# Patient Record
Sex: Female | Born: 1975
Health system: Southern US, Community
[De-identification: ages and names within clinical notes are randomized; demographics above are authoritative.]

## PROBLEM LIST (undated history)

## (undated) DIAGNOSIS — F329 Major depressive disorder, single episode, unspecified: Secondary | ICD-10-CM

## (undated) DIAGNOSIS — F17201 Nicotine dependence, unspecified, in remission: Secondary | ICD-10-CM

## (undated) DIAGNOSIS — M549 Dorsalgia, unspecified: Secondary | ICD-10-CM

## (undated) DIAGNOSIS — Z8639 Personal history of other endocrine, nutritional and metabolic disease: Secondary | ICD-10-CM

## (undated) DIAGNOSIS — F32A Depression, unspecified: Secondary | ICD-10-CM

## (undated) DIAGNOSIS — M5136 Other intervertebral disc degeneration, lumbar region: Secondary | ICD-10-CM

## (undated) DIAGNOSIS — M170 Bilateral primary osteoarthritis of knee: Secondary | ICD-10-CM

## (undated) DIAGNOSIS — M1990 Unspecified osteoarthritis, unspecified site: Secondary | ICD-10-CM

## (undated) DIAGNOSIS — G5600 Carpal tunnel syndrome, unspecified upper limb: Secondary | ICD-10-CM

## (undated) DIAGNOSIS — G43909 Migraine, unspecified, not intractable, without status migrainosus: Secondary | ICD-10-CM

## (undated) DIAGNOSIS — E785 Hyperlipidemia, unspecified: Secondary | ICD-10-CM

## (undated) DIAGNOSIS — K219 Gastro-esophageal reflux disease without esophagitis: Secondary | ICD-10-CM

## (undated) DIAGNOSIS — M51369 Other intervertebral disc degeneration, lumbar region without mention of lumbar back pain or lower extremity pain: Secondary | ICD-10-CM

## (undated) DIAGNOSIS — J309 Allergic rhinitis, unspecified: Secondary | ICD-10-CM

## (undated) DIAGNOSIS — M479 Spondylosis, unspecified: Secondary | ICD-10-CM

## (undated) DIAGNOSIS — Z8709 Personal history of other diseases of the respiratory system: Secondary | ICD-10-CM

## (undated) DIAGNOSIS — I872 Venous insufficiency (chronic) (peripheral): Secondary | ICD-10-CM

## (undated) DIAGNOSIS — I10 Essential (primary) hypertension: Secondary | ICD-10-CM

## (undated) DIAGNOSIS — N879 Dysplasia of cervix uteri, unspecified: Secondary | ICD-10-CM

## (undated) DIAGNOSIS — J42 Unspecified chronic bronchitis: Secondary | ICD-10-CM

## (undated) DIAGNOSIS — G8929 Other chronic pain: Secondary | ICD-10-CM

## (undated) DIAGNOSIS — I82409 Acute embolism and thrombosis of unspecified deep veins of unspecified lower extremity: Secondary | ICD-10-CM

## (undated) DIAGNOSIS — Z9884 Bariatric surgery status: Secondary | ICD-10-CM

## (undated) DIAGNOSIS — N83209 Unspecified ovarian cyst, unspecified side: Secondary | ICD-10-CM

## (undated) DIAGNOSIS — Z8781 Personal history of (healed) traumatic fracture: Secondary | ICD-10-CM

## (undated) DIAGNOSIS — S82201A Unspecified fracture of shaft of right tibia, initial encounter for closed fracture: Secondary | ICD-10-CM

## (undated) HISTORY — DX: Hyperlipidemia, unspecified: E78.5

## (undated) HISTORY — DX: Bilateral primary osteoarthritis of knee: M17.0

## (undated) HISTORY — PX: COLONOSCOPY: SHX174

## (undated) HISTORY — DX: Allergic rhinitis, unspecified: J30.9

## (undated) HISTORY — PX: CERVICAL CONE BIOPSY: SUR198

## (undated) HISTORY — DX: Unspecified ovarian cyst, unspecified side: N83.209

## (undated) HISTORY — DX: Unspecified fracture of shaft of right tibia, initial encounter for closed fracture: S82.201A

## (undated) HISTORY — DX: Acute embolism and thrombosis of unspecified deep veins of unspecified lower extremity: I82.409

## (undated) HISTORY — DX: Gastro-esophageal reflux disease without esophagitis: K21.9

## (undated) HISTORY — DX: Nicotine dependence, unspecified, in remission: F17.201

## (undated) HISTORY — DX: Depression, unspecified: F32.A

## (undated) HISTORY — DX: Personal history of other endocrine, nutritional and metabolic disease: Z86.39

## (undated) HISTORY — DX: Personal history of other diseases of the respiratory system: Z87.09

## (undated) HISTORY — DX: Venous insufficiency (chronic) (peripheral): I87.2

## (undated) HISTORY — DX: Migraine, unspecified, not intractable, without status migrainosus: G43.909

## (undated) HISTORY — DX: Other intervertebral disc degeneration, lumbar region: M51.36

## (undated) HISTORY — PX: CARPAL TUNNEL RELEASE: SHX101

## (undated) HISTORY — DX: Spondylosis, unspecified: M47.9

## (undated) HISTORY — DX: Personal history of (healed) traumatic fracture: Z87.81

## (undated) HISTORY — DX: Other intervertebral disc degeneration, lumbar region without mention of lumbar back pain or lower extremity pain: M51.369

---

## 2007-01-07 HISTORY — PX: VARICOSE VEIN SURGERY: SHX832

## 2007-03-07 HISTORY — PX: LAPAROSCOPIC GASTRIC BANDING: SHX1100

## 2010-09-16 ENCOUNTER — Encounter: Payer: Self-pay | Admitting: *Deleted

## 2010-09-16 ENCOUNTER — Emergency Department (HOSPITAL_BASED_OUTPATIENT_CLINIC_OR_DEPARTMENT_OTHER)
Admission: EM | Admit: 2010-09-16 | Discharge: 2010-09-17 | Disposition: A | Discharge status: Home / Self Care | Payer: Managed Care, Other (non HMO) | Attending: Emergency Medicine | Admitting: Emergency Medicine

## 2010-09-16 DIAGNOSIS — I10 Essential (primary) hypertension: Secondary | ICD-10-CM | POA: Insufficient documentation

## 2010-09-16 DIAGNOSIS — R079 Chest pain, unspecified: Secondary | ICD-10-CM | POA: Insufficient documentation

## 2010-09-16 DIAGNOSIS — R1013 Epigastric pain: Secondary | ICD-10-CM | POA: Insufficient documentation

## 2010-09-16 DIAGNOSIS — R112 Nausea with vomiting, unspecified: Secondary | ICD-10-CM | POA: Insufficient documentation

## 2010-09-16 HISTORY — DX: Essential (primary) hypertension: I10

## 2010-09-16 HISTORY — DX: Bariatric surgery status: Z98.84

## 2010-09-16 LAB — PREGNANCY, URINE: Preg Test, Ur: NEGATIVE

## 2010-09-16 LAB — BASIC METABOLIC PANEL
CO2: 27 mEq/L (ref 19–32)
Chloride: 103 mEq/L (ref 96–112)
Creatinine, Ser: 0.6 mg/dL (ref 0.50–1.10)
Glucose, Bld: 103 mg/dL — ABNORMAL HIGH (ref 70–99)

## 2010-09-16 LAB — URINALYSIS, ROUTINE W REFLEX MICROSCOPIC
Bilirubin Urine: NEGATIVE
Glucose, UA: NEGATIVE mg/dL
Hgb urine dipstick: NEGATIVE
Ketones, ur: NEGATIVE mg/dL
Protein, ur: NEGATIVE mg/dL

## 2010-09-16 MED ORDER — SODIUM CHLORIDE 0.9 % IV BOLUS (SEPSIS)
1000.0000 mL | Freq: Once | INTRAVENOUS | Status: AC
  Administered 2010-09-16: 1000 mL via INTRAVENOUS

## 2010-09-16 NOTE — ED Notes (Signed)
Pt to hall bed 1 by ems via stretcher.  Pt reports 3 days of abd pain, n/v and feeling bloated.  Pt reports she has had this pain in relation to her lap band placed in 2009.  Denies any fevers.

## 2010-09-16 NOTE — ED Notes (Signed)
On arrival to patients room to prompt for urine specimen, patients female friend on top of patient on stretcher kissing patient and laughing. Patient prompted for urine specimen per request for pregnancy test. Patient laughing with female friend. Shown where bathroom was and ambulates without difficulty or assistance.

## 2010-09-16 NOTE — ED Notes (Signed)
Pt upset with re: to long wait.  Explained to pt that she would need to be seen by MD prior to any medication and additional orders.  Pt stated "well I came by ambulance"  Explained to pt that even though she arrived via EMS did not mean that she would bee seen any faster and reminded pt that this was an emergency room and pts are seen based upon acuity.  Discussed with MD and pt will be next to be seen

## 2010-09-16 NOTE — ED Notes (Signed)
Pt presents to ED today via [facility] for abd pain x3 days.  Pt reports nausea with no vomitting.  Pt comfortable at present and family at bedside

## 2010-09-17 MED FILL — Ondansetron HCl Inj 4 MG/2ML (2 MG/ML): INTRAMUSCULAR | Qty: 2 | Status: AC

## 2010-09-17 NOTE — ED Provider Notes (Signed)
History     CSN: [id number] Arrival date & time: 09/16/2010  7:12 PM  Chief Complaint  Patient presents with  . Abdominal Pain   Patient is a 35 y.o. female presenting with abdominal pain. The history is provided by the patient and the spouse.  Abdominal Pain The primary symptoms of the illness include abdominal pain, nausea and vomiting. The primary symptoms of the illness do not include fever, shortness of breath, diarrhea, hematemesis, hematochezia or dysuria. The current episode started more than 2 days ago. The problem has been rapidly improving.  The abdominal pain began more than 2 days ago. The abdominal pain is located in the epigastric region. The abdominal pain radiates to the epigastric region. The severity of the abdominal pain is 8/10. The abdominal pain is relieved by nothing.  Symptoms associated with the illness do not include back pain.  HAD LAP BAND IN [place] 2009, SYMPTOMS OF EPIGASTRIC PAIN ON OFF AND VOMITING X 2 PER DAY. SIMILAR SYMPTOMS IN PAST. NOW FEELS BETTER AFTER FLUIDS TONIGHT PAIN WORSE. NOW BETTER. ALSO CONCERNED ABOUT PREGNANCY. SOME SYMPTOMS FOR 2 WEEKS.   Past Medical History  Diagnosis Date  . LAP-BAND surgery status   . Hypertension     Past Surgical History  Procedure Date  . Laparoscopic gastric banding     History reviewed. No pertinent family history.  History  Substance Use Topics  . Smoking status: Not on file  . Smokeless tobacco: Not on file  . Alcohol Use:     OB History    Grav Para Term Preterm Abortions TAB SAB Ect Mult Living                  Review of Systems  Constitutional: Negative for fever.  HENT: Negative for congestion and neck pain.   Respiratory: Negative for cough and shortness of breath.   Cardiovascular: Positive for chest pain.  Gastrointestinal: Positive for nausea, vomiting and abdominal pain. Negative for diarrhea, hematochezia and hematemesis.  Genitourinary: Negative for dysuria.  Musculoskeletal:  Negative for back pain.  Skin: Negative for rash.  Neurological: Negative for headaches.    Physical Exam  BP 128/73  Pulse 80  Temp(Src) 98.1 F (36.7 C) (Oral)  Resp 20  Ht 5\' 3"  (1.6 m)  Wt 235 lb (106.595 kg)  BMI 41.63 kg/m2  SpO2 95%  LMP 09/13/2010  Physical Exam  Nursing note and vitals reviewed. Constitutional: She is oriented to person, place, and time. She appears well-developed and well-nourished. No distress.  HENT:  Head: Normocephalic and atraumatic.  Mouth/Throat: Oropharynx is clear and moist.  Eyes: Conjunctivae and EOM are normal. Pupils are equal, round, and reactive to light.  Neck: Normal range of motion. Neck supple.  Cardiovascular: Normal rate, regular rhythm and normal heart sounds.   No murmur heard. Pulmonary/Chest: Effort normal and breath sounds normal. She has no wheezes. She exhibits no tenderness.  Abdominal: Soft. Bowel sounds are normal. She exhibits no distension. There is no tenderness.  Musculoskeletal: Normal range of motion. She exhibits no edema.  Neurological: She is alert and oriented to person, place, and time. She displays normal reflexes. No cranial nerve deficit. She exhibits normal muscle tone. Coordination normal.  Skin: Skin is warm and dry. No rash noted.    ED Course  Procedures  Results for orders placed during the hospital encounter of 09/16/10  URINALYSIS, ROUTINE W REFLEX MICROSCOPIC      Component Value Range   Color, Urine YELLOW  YELLOW  Appearance CLEAR  CLEAR    Specific Gravity, Urine 1.018  1.005 - 1.030    pH 5.5  5.0 - 8.0    Glucose, UA NEGATIVE  NEGATIVE (mg/dL)   Hgb urine dipstick NEGATIVE  NEGATIVE    Bilirubin Urine NEGATIVE  NEGATIVE    Ketones, ur NEGATIVE  NEGATIVE (mg/dL)   Protein, ur NEGATIVE  NEGATIVE (mg/dL)   Urobilinogen, UA 0.2  0.0 - 1.0 (mg/dL)   Nitrite NEGATIVE  NEGATIVE    Leukocytes, UA NEGATIVE  NEGATIVE   PREGNANCY, URINE      Component Value Range   Preg Test, Ur NEGATIVE     BASIC METABOLIC PANEL      Component Value Range   Sodium 140  135 - 145 (mEq/L)   Potassium 4.0  3.5 - 5.1 (mEq/L)   Chloride 103  96 - 112 (mEq/L)   CO2 27  19 - 32 (mEq/L)   Glucose, Bld 103 (*) 70 - 99 (mg/dL)   BUN 8  6 - 23 (mg/dL)   Creatinine, Ser 4.09  0.50 - 1.10 (mg/dL)   Calcium 9.6  8.4 - 81.1 (mg/dL)   GFR calc non Af Amer >60  >60 (mL/min)   GFR calc Af Amer >60  >60 (mL/min)     MDM HX OF LAP BAND GASTRIC SURGERY IN [place] PAITENT RETURNING IN ONE WEEK. HAS BEEN HAVING SOME VOMITING ABOUT 2 PER DAY AND DISCOMFORT EPIGASTRIC AREA. THINSK RELATED TO INFLAMMATION FROM NOT EATING PROPERLY, NOW FEELS BETTER. SHE AGREED TO LAB TEST AND WANTED TO RULE OUT PREGNANCY BUT LEFT BEFORE DISCHARGE. IN NAD NONTOXIC. RECEIVED IV NS HYDRATION IN ED AND IMPROVED. SHE PREFERRED TO FOLLOW UP IN [place].      Shelda Jakes, MD 09/17/10 [id number]

## 2010-09-17 NOTE — ED Notes (Signed)
Pt sts she cannot wait any longer. Dr. Deretha Emory explained that he was in the process of d/c'ing her. Pt sts "there's no reason for me to be here and my boyfriend is ready to go." Pt left prior to d/c and without d/c instructions.

## 2010-09-17 NOTE — ED Notes (Signed)
Patient to desk stating her fluids are done and she is ready to go. Explained to patient that assigned RN was in another patients rooma dn would be in to see her as soon as possible.

## 2010-11-25 ENCOUNTER — Encounter (HOSPITAL_BASED_OUTPATIENT_CLINIC_OR_DEPARTMENT_OTHER): Payer: Self-pay | Admitting: *Deleted

## 2010-11-25 ENCOUNTER — Emergency Department (HOSPITAL_BASED_OUTPATIENT_CLINIC_OR_DEPARTMENT_OTHER)
Admission: EM | Admit: 2010-11-25 | Discharge: 2010-11-25 | Disposition: A | Discharge status: Home / Self Care | Payer: Managed Care, Other (non HMO) | Attending: Emergency Medicine | Admitting: Emergency Medicine

## 2010-11-25 ENCOUNTER — Emergency Department (INDEPENDENT_AMBULATORY_CARE_PROVIDER_SITE_OTHER): Payer: Managed Care, Other (non HMO)

## 2010-11-25 DIAGNOSIS — R059 Cough, unspecified: Secondary | ICD-10-CM | POA: Insufficient documentation

## 2010-11-25 DIAGNOSIS — R911 Solitary pulmonary nodule: Secondary | ICD-10-CM

## 2010-11-25 DIAGNOSIS — Z79899 Other long term (current) drug therapy: Secondary | ICD-10-CM | POA: Insufficient documentation

## 2010-11-25 DIAGNOSIS — F17200 Nicotine dependence, unspecified, uncomplicated: Secondary | ICD-10-CM

## 2010-11-25 DIAGNOSIS — R05 Cough: Secondary | ICD-10-CM

## 2010-11-25 DIAGNOSIS — F3289 Other specified depressive episodes: Secondary | ICD-10-CM | POA: Insufficient documentation

## 2010-11-25 DIAGNOSIS — I10 Essential (primary) hypertension: Secondary | ICD-10-CM | POA: Insufficient documentation

## 2010-11-25 DIAGNOSIS — R0602 Shortness of breath: Secondary | ICD-10-CM | POA: Insufficient documentation

## 2010-11-25 DIAGNOSIS — F329 Major depressive disorder, single episode, unspecified: Secondary | ICD-10-CM | POA: Insufficient documentation

## 2010-11-25 DIAGNOSIS — J45909 Unspecified asthma, uncomplicated: Secondary | ICD-10-CM | POA: Insufficient documentation

## 2010-11-25 HISTORY — DX: Dysplasia of cervix uteri, unspecified: N87.9

## 2010-11-25 HISTORY — DX: Unspecified osteoarthritis, unspecified site: M19.90

## 2010-11-25 HISTORY — DX: Depression, unspecified: F32.A

## 2010-11-25 HISTORY — DX: Major depressive disorder, single episode, unspecified: F32.9

## 2010-11-25 MED ORDER — PREDNISONE 50 MG PO TABS
60.0000 mg | ORAL_TABLET | Freq: Once | ORAL | Status: AC
  Administered 2010-11-25: 60 mg via ORAL
  Filled 2010-11-25: qty 1

## 2010-11-25 MED ORDER — AEROCHAMBER PLUS W/MASK MISC
1.0000 | Freq: Once | Status: AC
  Administered 2010-11-25: 1
  Filled 2010-11-25: qty 1

## 2010-11-25 MED ORDER — BENZONATATE 100 MG PO CAPS: 100.0000 mg | ORAL_CAPSULE | Freq: Three times a day (TID) | ORAL | Status: AC

## 2010-11-25 MED ORDER — ALBUTEROL SULFATE (5 MG/ML) 0.5% IN NEBU
5.0000 mg | INHALATION_SOLUTION | Freq: Once | RESPIRATORY_TRACT | Status: AC
  Administered 2010-11-25: 5 mg via RESPIRATORY_TRACT
  Filled 2010-11-25: qty 1

## 2010-11-25 MED ORDER — ALBUTEROL SULFATE HFA 108 (90 BASE) MCG/ACT IN AERS
2.0000 | INHALATION_SPRAY | RESPIRATORY_TRACT | Status: DC | PRN
  Administered 2010-11-25: 2 via RESPIRATORY_TRACT
  Filled 2010-11-25: qty 6.7

## 2010-11-25 MED ORDER — PREDNISONE 20 MG PO TABS: 60.0000 mg | ORAL_TABLET | Freq: Every day | ORAL | Status: AC

## 2010-11-25 NOTE — ED Notes (Signed)
MD at bedside. 

## 2010-11-25 NOTE — ED Notes (Signed)
Pt reports sob x 1 month worse over last 2 days- cough productive for yellow sputum

## 2010-11-25 NOTE — ED Notes (Signed)
Crystal, RT at bedside to administer breathing treatment at this time

## 2010-11-25 NOTE — ED Notes (Signed)
rx x 2 for tessalon and prednisone- inhaler and spacer given by RT

## 2010-11-25 NOTE — ED Provider Notes (Signed)
History  This chart was scribed for Dayton Bailiff, MD by Bennett Scrape. This patient was seen in room MH04/MH04 and the patient's care was started at 5:07PM.  CSN: [id number] Arrival date & time: 11/25/2010  4:55 PM   First MD Initiated Contact with Patient 11/25/10 1657      Chief Complaint  Patient presents with  . Shortness of Breath  . Cough    The history is provided by the patient. No language interpreter was used.   Anquanette Bahner is a 35 y.o. female who presents to the Emergency Department complaining of 2 days of a mild constant non-radiating headache that is located behind the eyes with an associated productive cough. Pt describes sputum as being a clear yellow. Pt describes the symptoms as persisting over the past month, but have gradually worsened the past 2 days. Pt describes her headache as being similar to migraine symptoms but more milder. Pt states that she took Robitussin and Night Quill for the cough and IB profen for the headache with no improvement in either symptom.   Past Medical History  Diagnosis Date  . LAP-BAND surgery status   . Hypertension   . Bronchitis   . Arthritis   . Cervical dysplasia   . Depression     Past Surgical History  Procedure Date  . Laparoscopic gastric banding   . Carpal tunnel release   . Varicose vein surgery   . Cervical cone biopsy     No family history on file.  History  Substance Use Topics  . Smoking status: Current Everyday Smoker -- 0.5 packs/day    Types: Cigarettes  . Smokeless tobacco: Never Used  . Alcohol Use: No    Review of Systems A complete 10 system review of systems was obtained and is otherwise negative except as noted in the HPI.   Allergies  Demerol and Salt substitutes  Home Medications   Current Outpatient Rx  Name Route Sig Dispense Refill  . ASPIRIN EC 81 MG PO TBEC Oral Take 81 mg by mouth daily.      Marland Kitchen VITAMIN D 1000 UNITS PO CAPS Oral Take 2,000 Units by mouth 2 (two) times daily.       Marland Kitchen CLONAZEPAM 0.5 MG PO TABS Oral Take 0.5 mg by mouth 2 (two) times daily as needed. For anxiety    . HYDROCODONE-ACETAMINOPHEN 10-325 MG PO TABS Oral Take 1 tablet by mouth every 6 (six) hours as needed. pain    . IBUPROFEN 200 MG PO TABS Oral Take 800 mg by mouth 2 (two) times daily. pain    . LISINOPRIL 20 MG PO TABS Oral Take 20 mg by mouth daily.      Marland Kitchen ONE-DAILY MULTI VITAMINS PO TABS Oral Take 1 tablet by mouth daily.      Marland Kitchen OXYMETAZOLINE HCL 0.05 % NA SOLN Nasal Place 2 sprays into the nose daily as needed. congestion     . PSEUDOEPH-DOXYLAMINE-DM-APAP 60-7.06-04-998 MG/30ML PO LIQD Oral Take 30 mLs by mouth at bedtime.      Marland Kitchen PYRIDOXINE HCL 100 MG PO TABS Oral Take 100 mg by mouth daily.      Marland Kitchen BENZONATATE 100 MG PO CAPS Oral Take 1 capsule (100 mg total) by mouth every 8 (eight) hours. 21 capsule 0  . CLONAZEPAM 1 MG PO TABS Oral Take 1 mg by mouth 2 (two) times daily as needed. For anxiety    . LISINOPRIL 10 MG PO TABS Oral Take 10 mg by mouth daily.      Marland Kitchen  PREDNISONE 20 MG PO TABS Oral Take 3 tablets (60 mg total) by mouth daily. 15 tablet 0    BP 137/74  Pulse 82  Temp(Src) 98.4 F (36.9 C) (Oral)  Resp 20  Ht 5\' 3"  (1.6 m)  Wt 224 lb (101.606 kg)  BMI 39.68 kg/m2  SpO2 99%  LMP 11/18/2010  Physical Exam  Nursing note and vitals reviewed. Constitutional: She is oriented to person, place, and time. She appears well-developed and well-nourished.  HENT:  Head: Normocephalic and atraumatic.  Eyes: EOM are normal. Pupils are equal, round, and reactive to light.  Neck: Normal range of motion. Neck supple.  Cardiovascular: Normal rate and regular rhythm.   Pulmonary/Chest: Effort normal. She has wheezes (faint expiratory wheezes diffusely).  Abdominal: Soft. Bowel sounds are normal.  Musculoskeletal: Normal range of motion.  Neurological: She is alert and oriented to person, place, and time.  Skin: Skin is warm and dry.  Psychiatric: She has a normal mood and affect.  Her behavior is normal.    ED Course  Procedures (including critical care time)  DIAGNOSTIC STUDIES: Oxygen Saturation is 97% on room air, adequate by my interpretation.    COORDINATION OF CARE: 5:09PM- Discussed breathing treatment, chest x-ray order and steroid medication with patient at bedside and patient agreed to plan. 5:46PM- Pt rechecked. Lungs are clear after breathing treatment.   Labs Reviewed - No data to display Dg Chest 2 View  11/25/2010  *RADIOLOGY REPORT*  Clinical Data: Smoker with persistent cough.  History of hypertension.  CHEST - 2 VIEW 11/25/2010:  Comparison: None.  Findings: Cardiac silhouette upper normal in size.  Hilar and mediastinal contours unremarkable.  Small dense nodule in the left upper lobe anteriorly.  Mildly prominent bronchovascular markings diffusely.  Lungs otherwise clear.  No pleural effusions. Eventration of the anterior hemidiaphragms bilaterally.  Mild degenerative changes involving the thoracic spine. Gastric band noted, but more horizontally positioned than is typical.  IMPRESSION:  1.  Mild changes of bronchitis and/or asthma which may be acute or chronic.  No acute cardiopulmonary disease otherwise. 2.  Small dense left upper lobe nodule, likely a calcified granuloma.  In the absence of prior images for comparison, follow- up chest x-ray in 3 months is suggested to confirm stability. 3.  Gastric band which is more horizontally positioned than normal.  Original Report Authenticated By: Arnell Sieving, M.D.     1. Asthmatic bronchitis       MDM  Chest faint end expiratory wheezing diffusely. I feel her cough is likely secondary bronchospasm. She received not be draw treatment in emergency department with improvement of her symptoms. Just received a dose of prednisone. To be discharged home with albuterol inhaler to be used every 4 hours as well as a prednisone burst. His [facility] department care physician. Chest x-ray is relatively  unremarkable. Shows changes consistent with asthma. She calcified granuloma which i instructed her to have a followup chest x-ray in 3 months      I personally performed the services described in this documentation, which was scribed in my presence. The recorded information has been reviewed and considered.    Dayton Bailiff, MD 11/25/10 1750

## 2010-12-15 ENCOUNTER — Encounter (HOSPITAL_BASED_OUTPATIENT_CLINIC_OR_DEPARTMENT_OTHER): Payer: Self-pay | Admitting: *Deleted

## 2010-12-15 ENCOUNTER — Emergency Department (HOSPITAL_BASED_OUTPATIENT_CLINIC_OR_DEPARTMENT_OTHER)
Admission: EM | Admit: 2010-12-15 | Discharge: 2010-12-15 | Disposition: A | Discharge status: Home / Self Care | Payer: Managed Care, Other (non HMO) | Attending: Emergency Medicine | Admitting: Emergency Medicine

## 2010-12-15 DIAGNOSIS — R05 Cough: Secondary | ICD-10-CM | POA: Insufficient documentation

## 2010-12-15 DIAGNOSIS — R059 Cough, unspecified: Secondary | ICD-10-CM | POA: Insufficient documentation

## 2010-12-15 DIAGNOSIS — I10 Essential (primary) hypertension: Secondary | ICD-10-CM | POA: Insufficient documentation

## 2010-12-15 DIAGNOSIS — J209 Acute bronchitis, unspecified: Secondary | ICD-10-CM

## 2010-12-15 DIAGNOSIS — Z8739 Personal history of other diseases of the musculoskeletal system and connective tissue: Secondary | ICD-10-CM | POA: Insufficient documentation

## 2010-12-15 DIAGNOSIS — F3289 Other specified depressive episodes: Secondary | ICD-10-CM | POA: Insufficient documentation

## 2010-12-15 DIAGNOSIS — Z79899 Other long term (current) drug therapy: Secondary | ICD-10-CM | POA: Insufficient documentation

## 2010-12-15 DIAGNOSIS — F329 Major depressive disorder, single episode, unspecified: Secondary | ICD-10-CM | POA: Insufficient documentation

## 2010-12-15 MED ORDER — ALBUTEROL SULFATE HFA 108 (90 BASE) MCG/ACT IN AERS
2.0000 | INHALATION_SPRAY | RESPIRATORY_TRACT | Status: DC | PRN
  Filled 2010-12-15: qty 6.7

## 2010-12-15 MED ORDER — PREDNISONE 20 MG PO TABS: 40.0000 mg | ORAL_TABLET | Freq: Every day | ORAL | Status: AC

## 2010-12-15 MED ORDER — ALBUTEROL SULFATE HFA 108 (90 BASE) MCG/ACT IN AERS: 2.0000 | INHALATION_SPRAY | RESPIRATORY_TRACT | Status: DC | PRN

## 2010-12-15 MED ORDER — AZITHROMYCIN 250 MG PO TABS: 250.0000 mg | ORAL_TABLET | Freq: Every day | ORAL | Status: AC

## 2010-12-15 MED ORDER — IPRATROPIUM BROMIDE 0.02 % IN SOLN
RESPIRATORY_TRACT | Status: AC
  Administered 2010-12-15: 20:00:00
  Filled 2010-12-15: qty 2.5

## 2010-12-15 MED ORDER — ALBUTEROL SULFATE (5 MG/ML) 0.5% IN NEBU
2.5000 mg | INHALATION_SOLUTION | Freq: Once | RESPIRATORY_TRACT | Status: AC
  Administered 2010-12-15: 2.5 mg via RESPIRATORY_TRACT

## 2010-12-15 MED ORDER — IPRATROPIUM BROMIDE 0.02 % IN SOLN
0.5000 mg | Freq: Once | RESPIRATORY_TRACT | Status: AC
  Administered 2010-12-15: 0.5 mg via RESPIRATORY_TRACT

## 2010-12-15 MED ORDER — PREDNISONE 50 MG PO TABS
60.0000 mg | ORAL_TABLET | Freq: Once | ORAL | Status: AC
  Administered 2010-12-15: 60 mg via ORAL
  Filled 2010-12-15: qty 1

## 2010-12-15 MED ORDER — ALBUTEROL SULFATE (5 MG/ML) 0.5% IN NEBU
INHALATION_SOLUTION | RESPIRATORY_TRACT | Status: AC
  Administered 2010-12-15: 2.5 mg
  Filled 2010-12-15: qty 1

## 2010-12-15 NOTE — ED Notes (Signed)
Pt states she was here a month ago and was given, meds and tx for similar s/s. Never really got better. Exposure to fumes tonight at work which triggered increased s/s of cough, congestion, dyspnea with exertion, hoarseness.

## 2010-12-15 NOTE — ED Provider Notes (Signed)
History    Scribed for Felisa Bonier, MD, the patient was seen in room MH07/MH07. This chart was scribed by Katha Cabal.   CSN: [id number] Arrival date & time: 12/15/2010  6:37 PM   First MD Initiated Contact with Patient 12/15/10 2024      Chief Complaint  Patient presents with  . Cough    (Consider location/radiation/quality/duration/timing/severity/associated sxs/prior treatment) Patient is a 35 y.o. female presenting with cough. The history is provided by the patient. No language interpreter was used.  Cough This is a recurrent problem. Episode onset: about a month ago  The problem occurs constantly. The problem has been gradually worsening. The cough is productive of sputum (yellow discharge ). There has been no fever. Treatments tried: PCN, inhaler, steroids  The treatment provided mild relief. Her past medical history does not include asthma.  Patient was seen in ED about 3 weeks ago and diagnosed with bronchitis.   Patient adds that prescribed inhaler ran out 2 days ago.  Patient took steroids and penicillin.  Patient does not have history of asthma.      Past Medical History  Diagnosis Date  . LAP-BAND surgery status   . Hypertension   . Bronchitis   . Arthritis   . Cervical dysplasia   . Depression     Past Surgical History  Procedure Date  . Laparoscopic gastric banding   . Carpal tunnel release   . Varicose vein surgery   . Cervical cone biopsy     History reviewed. No pertinent family history.  History  Substance Use Topics  . Smoking status: Current Everyday Smoker -- 0.5 packs/day    Types: Cigarettes  . Smokeless tobacco: Never Used  . Alcohol Use: No    OB History    Grav Para Term Preterm Abortions TAB SAB Ect Mult Living                  Review of Systems  Respiratory: Positive for cough.   All other systems reviewed and are negative.    Allergies  Demerol and Salt substitutes  Home Medications   Current Outpatient Rx  Name  Route Sig Dispense Refill  . ASPIRIN EC 81 MG PO TBEC Oral Take 81 mg by mouth daily. For cough    . VITAMIN D 1000 UNITS PO CAPS Oral Take 2,000 Units by mouth 2 (two) times daily.      Marland Kitchen CLONAZEPAM 1 MG PO TABS Oral Take 1 mg by mouth 2 (two) times daily as needed. For anxiety    . HYDROCODONE-ACETAMINOPHEN 10-325 MG PO TABS Oral Take 1 tablet by mouth every 6 (six) hours as needed. pain    . IBUPROFEN 200 MG PO TABS Oral Take 800 mg by mouth 2 (two) times daily. pain    . LISINOPRIL 20 MG PO TABS Oral Take 20 mg by mouth daily.      Marland Kitchen ONE-DAILY MULTI VITAMINS PO TABS Oral Take 1 tablet by mouth daily.      Marland Kitchen PHENYLEPHRINE-DM-GG 5-10-100 MG/5ML PO LIQD Oral Take 15 mLs by mouth every 4 (four) hours as needed.      Marland Kitchen PYRIDOXINE HCL 100 MG PO TABS Oral Take 100 mg by mouth daily.      . ALBUTEROL SULFATE HFA 108 (90 BASE) MCG/ACT IN AERS Inhalation Inhale 2 puffs into the lungs every 4 (four) hours as needed for wheezing or shortness of breath (cough). 1 Inhaler 0  . AZITHROMYCIN 250 MG PO TABS Oral Take  1 tablet (250 mg total) by mouth daily. Take first 2 tablets together on day 1, then 1 tablet daily for days 2-5 6 tablet 0  . PREDNISONE 20 MG PO TABS Oral Take 2 tablets (40 mg total) by mouth daily. 10 tablet 0    BP 139/86  Pulse 54  Temp(Src) 98.3 F (36.8 C) (Oral)  Resp 20  Ht 5\' 3"  (1.6 m)  Wt 230 lb (104.327 kg)  BMI 40.74 kg/m2  SpO2 100%  LMP 11/18/2010  Physical Exam  Constitutional: She is oriented to person, place, and time. She appears well-developed and well-nourished. No distress.  HENT:  Head: Normocephalic and atraumatic.  Right Ear: Tympanic membrane normal.  Left Ear: Tympanic membrane normal.  Mouth/Throat: Uvula is midline, oropharynx is clear and moist and mucous membranes are normal. No posterior oropharyngeal edema or posterior oropharyngeal erythema.  Eyes: Conjunctivae and EOM are normal.  Neck: Normal range of motion.  Cardiovascular: Normal rate,  regular rhythm and normal heart sounds.  Exam reveals no gallop and no friction rub.   No murmur heard. Pulmonary/Chest: She has wheezes. She has no rales.       bilateral rhonchi, end expiratory wheezing, no rales   Neurological: She is alert and oriented to person, place, and time.  Skin: Skin is warm, dry and intact.  Psychiatric: She has a normal mood and affect. Her behavior is normal.    ED Course  Procedures (including critical care time)   DIAGNOSTIC STUDIES: Oxygen Saturation is 100% on room air, normal by my interpretation.     COORDINATION OF CARE: 9:02 PM   Physical exam complete.  Will order steroids and inhaler.      LABS / RADIOLOGY:   Labs Reviewed - No data to display No results found.       MDM  History and physical examination are suggestive of bronchitis. I do not suspect pneumonia.  Given that treatment without antibiotics has not helped.  I will prescribe and antibiotic as well as steroid and bronchodilator.        MEDICATIONS GIVEN IN THE E.D. Scheduled Meds:    . albuterol  2.5 mg Nebulization Once  . albuterol      . ipratropium      . ipratropium  0.5 mg Nebulization Once  . predniSONE  60 mg Oral Once   Continuous Infusions:      IMPRESSION: 1. Acute bronchitis      DISCHARGE MEDICATIONS: New Prescriptions   ALBUTEROL (PROVENTIL HFA;VENTOLIN HFA) 108 (90 BASE) MCG/ACT INHALER    Inhale 2 puffs into the lungs every 4 (four) hours as needed for wheezing or shortness of breath (cough).   AZITHROMYCIN (ZITHROMAX) 250 MG TABLET    Take 1 tablet (250 mg total) by mouth daily. Take first 2 tablets together on day 1, then 1 tablet daily for days 2-5   PREDNISONE (DELTASONE) 20 MG TABLET    Take 2 tablets (40 mg total) by mouth daily.      I personally performed the services described in this documentation, which was scribed in my presence. The recorded information has been reviewed and considered.             Felisa Bonier, MD 12/15/10 2123

## 2011-01-04 ENCOUNTER — Encounter (HOSPITAL_BASED_OUTPATIENT_CLINIC_OR_DEPARTMENT_OTHER): Payer: Self-pay | Admitting: *Deleted

## 2011-01-04 ENCOUNTER — Emergency Department (HOSPITAL_BASED_OUTPATIENT_CLINIC_OR_DEPARTMENT_OTHER)
Admission: EM | Admit: 2011-01-04 | Discharge: 2011-01-04 | Disposition: A | Discharge status: Home / Self Care | Payer: Managed Care, Other (non HMO) | Attending: Emergency Medicine | Admitting: Emergency Medicine

## 2011-01-04 DIAGNOSIS — F17200 Nicotine dependence, unspecified, uncomplicated: Secondary | ICD-10-CM | POA: Insufficient documentation

## 2011-01-04 DIAGNOSIS — Z79899 Other long term (current) drug therapy: Secondary | ICD-10-CM | POA: Insufficient documentation

## 2011-01-04 DIAGNOSIS — J40 Bronchitis, not specified as acute or chronic: Secondary | ICD-10-CM

## 2011-01-04 DIAGNOSIS — J3489 Other specified disorders of nose and nasal sinuses: Secondary | ICD-10-CM | POA: Insufficient documentation

## 2011-01-04 DIAGNOSIS — I10 Essential (primary) hypertension: Secondary | ICD-10-CM | POA: Insufficient documentation

## 2011-01-04 MED ORDER — ALBUTEROL SULFATE (5 MG/ML) 0.5% IN NEBU
INHALATION_SOLUTION | RESPIRATORY_TRACT | Status: AC
  Administered 2011-01-04: 5 mL
  Filled 2011-01-04: qty 1

## 2011-01-04 NOTE — ED Provider Notes (Signed)
History  This chart was scribed for Samantha Doyle. Samantha Lamas, MD by Samantha Doyle. This patient was seen in room MHCT3/[facility]3 and the patient's care was started at 10:09PM.  CSN: [id number]  Arrival date & time 01/04/11  2011   First MD Initiated Contact with Patient 01/04/11 2158      Chief Complaint  Patient presents with  . Nasal Congestion    The history is provided by the patient. No language interpreter was used.   Samantha Doyle is a 35 y.o. female who presents to the Emergency Department complaining of one month of gradual onset, non-changing, constant nasal congestion, right ear pain and dizziness. Pt denies modifying factors. Pt has not taken any medications at home to improve symptoms. Pt denies cough, nausea, vomiting and diarrhea as associated symptoms. Pt has been seen here several times in last month for same sx. Pt states that she did not get abx filled from last visit. Pt has a h/o HTN, bronchitis and arthritis and is on regular medications at home for these conditions.   Past Medical History  Diagnosis Date  . LAP-BAND surgery status   . Hypertension   . Bronchitis   . Arthritis   . Cervical dysplasia   . Depression     Past Surgical History  Procedure Date  . Laparoscopic gastric banding   . Carpal tunnel release   . Varicose vein surgery   . Cervical cone biopsy     History reviewed. No pertinent family history.  History  Substance Use Topics  . Smoking status: Current Everyday Smoker -- 0.5 packs/day    Types: Cigarettes  . Smokeless tobacco: Never Used  . Alcohol Use: No    OB History    Grav Para Term Preterm Abortions TAB SAB Ect Mult Living                  Review of Systems  Constitutional: Negative for fever and chills.  HENT: Positive for ear pain and congestion.   Respiratory: Negative for cough and shortness of breath.   Cardiovascular: Negative for chest pain and leg swelling.  Gastrointestinal: Negative for nausea, vomiting and  diarrhea.  Musculoskeletal: Negative for back pain and joint swelling.  Skin: Negative for rash.  Neurological: Positive for dizziness. Negative for headaches.  All other systems reviewed and are negative.    Allergies  Demerol and Monosodium glutamate  Home Medications   Current Outpatient Rx  Name Route Sig Dispense Refill  . ALBUTEROL SULFATE HFA 108 (90 BASE) MCG/ACT IN AERS Inhalation Inhale 2 puffs into the lungs every 4 (four) hours as needed for wheezing or shortness of breath (cough). 1 Inhaler 0  . ASPIRIN EC 81 MG PO TBEC Oral Take 81 mg by mouth daily.     Marland Kitchen BISMUTH SUBSALICYLATE 262 MG PO CHEW Oral Chew 524 mg by mouth once as needed. For nausea     . VITAMIN D 1000 UNITS PO CAPS Oral Take 2,000 Units by mouth 2 (two) times daily.      Marland Kitchen CLONAZEPAM 1 MG PO TABS Oral Take 1 mg by mouth 2 (two) times daily as needed. For anxiety    . HYDROCODONE-ACETAMINOPHEN 10-325 MG PO TABS Oral Take 1 tablet by mouth every 6 (six) hours as needed. pain    . IBUPROFEN 200 MG PO TABS Oral Take 800 mg by mouth 2 (two) times daily. pain    . LISINOPRIL 20 MG PO TABS Oral Take 20 mg by mouth daily.      Marland Kitchen  ADULT MULTIVITAMIN W/MINERALS CH Oral Take 1 tablet by mouth daily.      Marland Kitchen PHENYLEPHRINE-DM-GG 5-10-100 MG/5ML PO LIQD Oral Take 15 mLs by mouth every 4 (four) hours as needed. For cough     . PYRIDOXINE HCL 100 MG PO TABS Oral Take 100 mg by mouth daily.        Triage Vitals: BP 145/69  Pulse 77  Temp(Src) 98.2 F (36.8 C) (Oral)  Resp 18  SpO2 99%  LMP 12/19/2010  Physical Exam  Nursing note and vitals reviewed. Constitutional: She is oriented to person, place, and time. She appears well-developed and well-nourished.  HENT:  Head: Normocephalic and atraumatic.  Eyes: Conjunctivae and EOM are normal.  Neck: Normal range of motion. Neck supple. No tracheal deviation present.  Cardiovascular: Normal rate, regular rhythm and normal heart sounds.   Pulmonary/Chest: Effort normal  and breath sounds normal. No respiratory distress.  Abdominal: Soft. There is no tenderness.  Musculoskeletal: Normal range of motion. She exhibits no edema.  Neurological: She is alert and oriented to person, place, and time. No cranial nerve deficit.  Skin: Skin is warm and dry.  Psychiatric: She has a normal mood and affect. Her behavior is normal.    ED Course  Procedures (including critical care time)  DIAGNOSTIC STUDIES: Oxygen Saturation is 99% on room air, normal by my interpretation.    COORDINATION OF CARE: 10:11PM-Discussed treatment plan with pt at bedside and pt agreed to plan.   Labs Reviewed - No data to display No results found.   1. 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  No fevers.  Received a script for z pak earlier this week, has not gotten it filled.  sats normal, no sig wheezing, she simply requests a neb treatment which was given.  She just now receiving insurance, will work on getting a PCP so she can have her own nebulizer at home.  She report her inhaler is not as effective for her.  PT improved after 1 neb, d/c home, encouraged outpt follow up with established PCP.        I personally performed the services described in this documentation, which was scribed in my presence. The recorded information has been reviewed and considered.   Samantha Doyle. Samantha Orama, MD 01/06/11 1004

## 2011-01-04 NOTE — ED Notes (Signed)
Pt was seen here 12/9 for same dx with bronchitis. Pt was unable to afford to have abx filled. Pt states she has worked this out with [facility] and will be able to pick up rx for abx tomorrow. Pt here requesting a breathing treatment. Pt refuses to put on gown. Pt c/o cough and tightness in chest

## 2011-01-04 NOTE — ED Notes (Signed)
Pt has been seen here several times in last month for same sx as well as right ear pain and dizziness states that she did not get abx filled from last visit

## 2011-01-04 NOTE — ED Notes (Signed)
Pt states that she would just like a breathing tx

## 2011-01-04 NOTE — Discharge Instructions (Signed)
Bronchitis Bronchitis is the body's way of reacting to injury and/or infection (inflammation) of the bronchi. Bronchi are the air tubes that extend from the windpipe into the lungs. If the inflammation becomes severe, it may cause shortness of breath. CAUSES  Inflammation may be caused by:  A virus.   Germs (bacteria).   Dust.   Allergens.   Pollutants and many other irritants.  The cells lining the bronchial tree are covered with tiny hairs (cilia). These constantly beat upward, away from the lungs, toward the mouth. This keeps the lungs free of pollutants. When these cells become too irritated and are unable to do their job, mucus begins to develop. This causes the characteristic cough of bronchitis. The cough clears the lungs when the cilia are unable to do their job. Without either of these protective mechanisms, the mucus would settle in the lungs. Then you would develop pneumonia. Smoking is a common cause of bronchitis and can contribute to pneumonia. Stopping this habit is the single most important thing you can do to help yourself. TREATMENT   Your caregiver may prescribe an antibiotic if the cough is caused by bacteria. Also, medicines that open up your airways make it easier to breathe. Your caregiver may also recommend or prescribe an expectorant. It will loosen the mucus to be coughed up. Only take over-the-counter or prescription medicines for pain, discomfort, or fever as directed by your caregiver.   Removing whatever causes the problem (smoking, for example) is critical to preventing the problem from getting worse.   Cough suppressants may be prescribed for relief of cough symptoms.   Inhaled medicines may be prescribed to help with symptoms now and to help prevent problems from returning.   For those with recurrent (chronic) bronchitis, there may be a need for steroid medicines.  SEEK IMMEDIATE MEDICAL CARE IF:   During treatment, you develop more pus-like mucus  (purulent sputum).   You have a fever.   Your baby is older than 3 months with a rectal temperature of 102 F (38.9 C) or higher.   Your baby is 3 months old or younger with a rectal temperature of 100.4 F (38 C) or higher.   You become progressively more ill.   You have increased difficulty breathing, wheezing, or shortness of breath.  It is necessary to seek immediate medical care if you are elderly or sick from any other disease. MAKE SURE YOU:   Understand these instructions.   Will watch your condition.   Will get help right away if you are not doing well or get worse.  Document Released: 12/23/2004 Document Revised: 09/04/2010 Document Reviewed: 11/02/2007 ExitCare Patient Information 2012 [employer]. 

## 2011-02-08 DIAGNOSIS — F419 Anxiety disorder, unspecified: Secondary | ICD-10-CM | POA: Insufficient documentation

## 2011-02-08 DIAGNOSIS — I10 Essential (primary) hypertension: Secondary | ICD-10-CM | POA: Insufficient documentation

## 2011-04-06 ENCOUNTER — Encounter (HOSPITAL_BASED_OUTPATIENT_CLINIC_OR_DEPARTMENT_OTHER): Payer: Self-pay | Admitting: *Deleted

## 2011-04-06 ENCOUNTER — Emergency Department (HOSPITAL_BASED_OUTPATIENT_CLINIC_OR_DEPARTMENT_OTHER)
Admission: EM | Admit: 2011-04-06 | Discharge: 2011-04-06 | Disposition: A | Discharge status: Home / Self Care | Payer: Managed Care, Other (non HMO) | Attending: Emergency Medicine | Admitting: Emergency Medicine

## 2011-04-06 DIAGNOSIS — I10 Essential (primary) hypertension: Secondary | ICD-10-CM | POA: Insufficient documentation

## 2011-04-06 DIAGNOSIS — R059 Cough, unspecified: Secondary | ICD-10-CM | POA: Insufficient documentation

## 2011-04-06 DIAGNOSIS — R062 Wheezing: Secondary | ICD-10-CM | POA: Insufficient documentation

## 2011-04-06 DIAGNOSIS — R05 Cough: Secondary | ICD-10-CM | POA: Insufficient documentation

## 2011-04-06 DIAGNOSIS — R0602 Shortness of breath: Secondary | ICD-10-CM | POA: Insufficient documentation

## 2011-04-06 DIAGNOSIS — J40 Bronchitis, not specified as acute or chronic: Secondary | ICD-10-CM

## 2011-04-06 MED ORDER — ALBUTEROL SULFATE HFA 108 (90 BASE) MCG/ACT IN AERS
2.0000 | INHALATION_SPRAY | Freq: Once | RESPIRATORY_TRACT | Status: AC
  Administered 2011-04-06: 2 via RESPIRATORY_TRACT
  Filled 2011-04-06: qty 6.7

## 2011-04-06 MED ORDER — PREDNISONE 10 MG PO TABS: ORAL_TABLET | ORAL | Status: DC

## 2011-04-06 MED ORDER — ALBUTEROL SULFATE (5 MG/ML) 0.5% IN NEBU
INHALATION_SOLUTION | RESPIRATORY_TRACT | Status: AC
  Administered 2011-04-06: 5 mg via RESPIRATORY_TRACT
  Filled 2011-04-06: qty 1

## 2011-04-06 MED ORDER — PREDNISONE 10 MG PO TABS
ORAL_TABLET | ORAL | Status: AC
  Administered 2011-04-06: 10 mg
  Filled 2011-04-06: qty 1

## 2011-04-06 MED ORDER — PREDNISONE 50 MG PO TABS
ORAL_TABLET | ORAL | Status: AC
  Administered 2011-04-06: 50 mg
  Filled 2011-04-06: qty 1

## 2011-04-06 MED ORDER — AZITHROMYCIN 250 MG PO TABS: 250.0000 mg | ORAL_TABLET | Freq: Every day | ORAL | Status: DC

## 2011-04-06 MED ORDER — PREDNISONE 10 MG PO TABS: 60.0000 mg | ORAL_TABLET | Freq: Every day | ORAL | Status: DC

## 2011-04-06 MED ORDER — IPRATROPIUM BROMIDE 0.02 % IN SOLN
RESPIRATORY_TRACT | Status: AC
  Administered 2011-04-06: 0.5 mg
  Filled 2011-04-06: qty 2.5

## 2011-04-06 MED ORDER — AZITHROMYCIN 250 MG PO TABS: 250.0000 mg | ORAL_TABLET | Freq: Every day | ORAL | Status: AC

## 2011-04-06 NOTE — Discharge Instructions (Signed)
Bronchitis Bronchitis is a problem of the air tubes leading to your lungs. This problem makes it hard for air to get in and out of the lungs. You may cough a lot because your air tubes are narrow. Going without care can cause lasting (chronic) bronchitis. HOME CARE   Drink enough fluids to keep your pee (urine) clear or pale yellow.   Use a cool mist humidifier.   Quit smoking if you smoke. If you keep smoking, the bronchitis might not get better.   Only take medicine as told by your doctor.  GET HELP RIGHT AWAY IF:   Coughing keeps you awake.   You start to wheeze.   You become more sick or weak.   You have a hard time breathing or get short of breath.   You cough up blood.   Coughing lasts more than 2 weeks.   You have a fever.   Your baby is older than 3 months with a rectal temperature of 102 F (38.9 C) or higher.   Your baby is 3 months old or younger with a rectal temperature of 100.4 F (38 C) or higher.  MAKE SURE YOU:  Understand these instructions.   Will watch your condition.   Will get help right away if you are not doing well or get worse.  Document Released: 06/11/2007 Document Revised: 12/12/2010 Document Reviewed: 11/24/2008 ExitCare Patient Information 2012 [employer]. 

## 2011-04-06 NOTE — ED Notes (Signed)
Pt has a hx of bronchitis since age 36. "Know that is what I have". Cough x 3 days. Normally treated at Urgent Care "but they are closed"

## 2011-04-06 NOTE — ED Provider Notes (Signed)
History     CSN: [id number]  Arrival date & time 04/06/11  1428   First MD Initiated Contact with Patient 04/06/11 1540      Chief Complaint  Patient presents with  . Cough    (Consider location/radiation/quality/duration/timing/severity/associated sxs/prior treatment) Patient is a 36 y.o. female presenting with cough. The history is provided by the patient. No language interpreter was used.  Cough This is a new problem. The current episode started more than 2 days ago. The problem occurs constantly. The problem has been gradually worsening. The cough is non-productive. There has been no fever. Associated symptoms include shortness of breath and wheezing. She has tried nothing for the symptoms. The treatment provided no relief. She is a smoker. Her past medical history is significant for bronchitis.    Past Medical History  Diagnosis Date  . LAP-BAND surgery status   . Hypertension   . Bronchitis   . Arthritis   . Cervical dysplasia   . Depression     Past Surgical History  Procedure Date  . Laparoscopic gastric banding   . Carpal tunnel release   . Varicose vein surgery   . Cervical cone biopsy     History reviewed. No pertinent family history.  History  Substance Use Topics  . Smoking status: Current Everyday Smoker -- 0.5 packs/day    Types: Cigarettes  . Smokeless tobacco: Never Used  . Alcohol Use: No    OB History    Grav Para Term Preterm Abortions TAB SAB Ect Mult Living                  Review of Systems  Respiratory: Positive for cough, shortness of breath and wheezing.   All other systems reviewed and are negative.    Allergies  Demerol and Monosodium glutamate  Home Medications   Current Outpatient Rx  Name Route Sig Dispense Refill  . ALBUTEROL SULFATE HFA 108 (90 BASE) MCG/ACT IN AERS Inhalation Inhale 2 puffs into the lungs every 4 (four) hours as needed for wheezing or shortness of breath (cough). 1 Inhaler 0  . ASPIRIN EC 81 MG PO  TBEC Oral Take 81 mg by mouth daily.     Marland Kitchen CALCIUM CARBONATE ANTACID 500 MG PO CHEW Oral Chew 2 tablets by mouth 2 (two) times daily as needed. For nausea    . VITAMIN D 1000 UNITS PO TABS Oral Take 2,000 Units by mouth 2 (two) times daily.    Marland Kitchen CLONAZEPAM 1 MG PO TABS Oral Take 1 mg by mouth 2 (two) times daily as needed. For anxiety    . CYANOCOBALAMIN 1000 MCG/ML IJ SOLN Intramuscular Inject 1,000 mcg into the muscle every 30 (thirty) days.    Marland Kitchen HYDROCODONE-ACETAMINOPHEN 10-325 MG PO TABS Oral Take 1 tablet by mouth every 6 (six) hours as needed. pain    . IBUPROFEN 200 MG PO TABS Oral Take 800 mg by mouth 3 (three) times daily as needed. pain    . LISINOPRIL 20 MG PO TABS Oral Take 20 mg by mouth daily.      . ADULT MULTIVITAMIN W/MINERALS CH Oral Take 1 tablet by mouth daily.      Marland Kitchen PYRIDOXINE HCL 100 MG PO TABS Oral Take 100 mg by mouth daily.        BP 100/73  Pulse 83  Temp(Src) 98.5 F (36.9 C) (Oral)  Resp 20  Ht 5\' 3"  (1.6 m)  Wt 224 lb (101.606 kg)  BMI 39.68 kg/m2  SpO2 96%  LMP 03/21/2011  Physical Exam  Vitals reviewed. Constitutional: She is oriented to person, place, and time. She appears well-developed and well-nourished.  HENT:  Head: Normocephalic and atraumatic.  Right Ear: External ear normal.  Left Ear: External ear normal.  Nose: Nose normal.  Mouth/Throat: Oropharynx is clear and moist.  Eyes: Conjunctivae and EOM are normal. Pupils are equal, round, and reactive to light.  Neck: Normal range of motion. Neck supple.  Cardiovascular: Normal rate and normal heart sounds.   Pulmonary/Chest: Effort normal and breath sounds normal.  Abdominal: Soft. Bowel sounds are normal.  Musculoskeletal: Normal range of motion.  Neurological: She is alert and oriented to person, place, and time. She has normal reflexes.  Skin: Skin is warm.  Psychiatric: She has a normal mood and affect.    ED Course  Procedures (including critical care time)  Labs Reviewed - No  data to display No results found.   No diagnosis found.    MDM  Pt given albuterol neb prior to my exam.  Respiratory reports wheezing prior to neb. Pt given prednisone, zithromax and albuterol.       Lonia Skinner [place], [place] 04/06/11 1558

## 2011-04-06 NOTE — ED Provider Notes (Signed)
Medical screening examination/treatment/procedure(s) were performed by non-physician practitioner and as supervising physician I was immediately available for consultation/collaboration.   Celene Kras, MD 04/06/11 [phone]

## 2012-01-20 ENCOUNTER — Encounter (HOSPITAL_BASED_OUTPATIENT_CLINIC_OR_DEPARTMENT_OTHER): Payer: Self-pay

## 2012-01-20 ENCOUNTER — Emergency Department (HOSPITAL_BASED_OUTPATIENT_CLINIC_OR_DEPARTMENT_OTHER)
Admission: EM | Admit: 2012-01-20 | Discharge: 2012-01-20 | Disposition: A | Discharge status: Home / Self Care | Payer: Worker's Compensation | Attending: Emergency Medicine | Admitting: Emergency Medicine

## 2012-01-20 DIAGNOSIS — IMO0002 Reserved for concepts with insufficient information to code with codable children: Secondary | ICD-10-CM | POA: Insufficient documentation

## 2012-01-20 DIAGNOSIS — Z79899 Other long term (current) drug therapy: Secondary | ICD-10-CM | POA: Insufficient documentation

## 2012-01-20 DIAGNOSIS — F329 Major depressive disorder, single episode, unspecified: Secondary | ICD-10-CM | POA: Insufficient documentation

## 2012-01-20 DIAGNOSIS — F3289 Other specified depressive episodes: Secondary | ICD-10-CM | POA: Insufficient documentation

## 2012-01-20 DIAGNOSIS — I10 Essential (primary) hypertension: Secondary | ICD-10-CM | POA: Insufficient documentation

## 2012-01-20 DIAGNOSIS — F17200 Nicotine dependence, unspecified, uncomplicated: Secondary | ICD-10-CM | POA: Insufficient documentation

## 2012-01-20 DIAGNOSIS — Y9269 Other specified industrial and construction area as the place of occurrence of the external cause: Secondary | ICD-10-CM | POA: Insufficient documentation

## 2012-01-20 DIAGNOSIS — W260XXA Contact with knife, initial encounter: Secondary | ICD-10-CM | POA: Insufficient documentation

## 2012-01-20 DIAGNOSIS — Z8739 Personal history of other diseases of the musculoskeletal system and connective tissue: Secondary | ICD-10-CM | POA: Insufficient documentation

## 2012-01-20 DIAGNOSIS — Z8709 Personal history of other diseases of the respiratory system: Secondary | ICD-10-CM | POA: Insufficient documentation

## 2012-01-20 DIAGNOSIS — Z8742 Personal history of other diseases of the female genital tract: Secondary | ICD-10-CM | POA: Insufficient documentation

## 2012-01-20 DIAGNOSIS — S51809A Unspecified open wound of unspecified forearm, initial encounter: Secondary | ICD-10-CM | POA: Insufficient documentation

## 2012-01-20 DIAGNOSIS — Y9389 Activity, other specified: Secondary | ICD-10-CM | POA: Insufficient documentation

## 2012-01-20 DIAGNOSIS — Y990 Civilian activity done for income or pay: Secondary | ICD-10-CM | POA: Insufficient documentation

## 2012-01-20 DIAGNOSIS — Z7982 Long term (current) use of aspirin: Secondary | ICD-10-CM | POA: Insufficient documentation

## 2012-01-20 DIAGNOSIS — Z9884 Bariatric surgery status: Secondary | ICD-10-CM | POA: Insufficient documentation

## 2012-01-20 DIAGNOSIS — W261XXA Contact with sword or dagger, initial encounter: Secondary | ICD-10-CM | POA: Insufficient documentation

## 2012-01-20 DIAGNOSIS — S51812A Laceration without foreign body of left forearm, initial encounter: Secondary | ICD-10-CM

## 2012-01-20 MED ORDER — LIDOCAINE HCL (PF) 1 % IJ SOLN
INTRAMUSCULAR | Status: AC
  Administered 2012-01-20: 5 mL
  Filled 2012-01-20: qty 5

## 2012-01-20 MED ORDER — LIDOCAINE HCL 2 % IJ SOLN: 5.0000 mL | Freq: Once | INTRAMUSCULAR | Status: DC

## 2012-01-20 NOTE — ED Notes (Signed)
MD at bedside performing lac repair.

## 2012-01-20 NOTE — ED Notes (Signed)
MD at bedside. 

## 2012-01-20 NOTE — ED Provider Notes (Signed)
History     CSN: [id number]  Arrival date & time 01/20/12  1191   First MD Initiated Contact with Patient 01/20/12 1946      Chief Complaint  Patient presents with  . Arm Injury    (Consider location/radiation/quality/duration/timing/severity/associated sxs/prior treatment) Patient is a 37 y.o. female presenting with arm injury. The history is provided by the patient. No language interpreter was used.  Arm Injury  The incident occurred just prior to arrival. Incident location: Patient accidentally cut her left forearm with a knife while cutting balloon off a board at work. The injury mechanism was a cut/puncture wound. Context: She was doing Horticulturist, commercial at [employer]. The wounds were not self-inflicted. She came to the ER via personal transport. There is an injury to the left forearm. Associated symptoms comments: None.. There have been no prior injuries to these areas. She is right-handed. Her tetanus status is UTD. She has received no recent medical care.    Past Medical History  Diagnosis Date  . LAP-BAND surgery status   . Hypertension   . Bronchitis   . Arthritis   . Cervical dysplasia   . Depression     Past Surgical History  Procedure Date  . Laparoscopic gastric banding   . Carpal tunnel release   . Varicose vein surgery   . Cervical cone biopsy     No family history on file.  History  Substance Use Topics  . Smoking status: Current Every Day Smoker -- 0.5 packs/day    Types: Cigarettes  . Smokeless tobacco: Never Used  . Alcohol Use: No    OB History    Grav Para Term Preterm Abortions TAB SAB Ect Mult Living                  Review of Systems  All other systems reviewed and are negative.    Allergies  Demerol and Monosodium glutamate  Home Medications   Current Outpatient Rx  Name  Route  Sig  Dispense  Refill  . ALBUTEROL SULFATE HFA 108 (90 BASE) MCG/ACT IN AERS   Inhalation   Inhale 2 puffs into the lungs every 4 (four) hours as needed  for wheezing or shortness of breath (cough).   1 Inhaler   0   . ASPIRIN EC 81 MG PO TBEC   Oral   Take 81 mg by mouth daily.          Marland Kitchen CALCIUM CARBONATE ANTACID 500 MG PO CHEW   Oral   Chew 2 tablets by mouth 2 (two) times daily as needed. For nausea         . VITAMIN D 1000 UNITS PO TABS   Oral   Take 2,000 Units by mouth 2 (two) times daily.         Marland Kitchen CLONAZEPAM 1 MG PO TABS   Oral   Take 1 mg by mouth 2 (two) times daily as needed. For anxiety         . CYANOCOBALAMIN 1000 MCG/ML IJ SOLN   Intramuscular   Inject 1,000 mcg into the muscle every 30 (thirty) days.         Marland Kitchen HYDROCODONE-ACETAMINOPHEN 10-325 MG PO TABS   Oral   Take 1 tablet by mouth every 6 (six) hours as needed. pain         . IBUPROFEN 200 MG PO TABS   Oral   Take 800 mg by mouth 3 (three) times daily as needed. pain         .  ADULT MULTIVITAMIN W/MINERALS CH   Oral   Take 1 tablet by mouth daily.           Marland Kitchen PREDNISONE 10 MG PO TABS      5,4,3,2,1   15 tablet   0   . PYRIDOXINE HCL 100 MG PO TABS   Oral   Take 100 mg by mouth daily.             BP 149/94  Pulse 72  Temp 98.9 F (37.2 C) (Oral)  Resp 16  Ht 5\' 3"  (1.6 m)  Wt 187 lb (84.823 kg)  BMI 33.13 kg/m2  SpO2 100%  LMP 12/22/2011  Physical Exam  Constitutional: She is oriented to person, place, and time. She appears well-developed and well-nourished. No distress.  Musculoskeletal: Normal range of motion.  Neurological: She is alert and oriented to person, place, and time.       No sensory or motor deficit.  Skin:       She has a 1 cm laceration on the volar surface of her left forearm. There is no foreign body in the wound. She has intact pulses sensation and tendon function in the left hand.  Psychiatric: She has a normal mood and affect. Her behavior is normal.    ED Course  LACERATION REPAIR Date/Time: 01/20/2012 8:15 PM Performed by: Osvaldo Human Authorized by: Osvaldo Human Consent:  Verbal consent obtained. Risks and benefits: risks, benefits and alternatives were discussed Consent given by: patient Patient understanding: patient states understanding of the procedure being performed Patient consent: the patient's understanding of the procedure matches consent given Site marked: the operative site was not marked Patient identity confirmed: verbally with patient Time out: Immediately prior to procedure a "time out" was called to verify the correct patient, procedure, equipment, support staff and site/side marked as required. Body area: upper extremity Location details: left lower arm Laceration length: 1 cm Foreign bodies: no foreign bodies Tendon involvement: none Nerve involvement: none Vascular damage: no Anesthesia: local infiltration Local anesthetic: lidocaine 1% without epinephrine Patient sedated: no Irrigation solution: saline Irrigation method: syringe Amount of cleaning: standard Debridement: none Degree of undermining: none Skin closure: 5-0 nylon Number of sutures: 3 Technique: simple Approximation: loose Approximation difficulty: simple Dressing: Band-Aid. Patient tolerance: Patient tolerated the procedure well with no immediate complications.   (including critical care time)      1. Laceration of left forearm    Sutures out in 8-10 days at [facility].       Carleene Cooper III, MD 01/20/12 2036

## 2012-01-20 NOTE — ED Notes (Signed)
Copy of uds screening paper hand delivered to "Samantha Doyle" patient's [employer]

## 2012-01-20 NOTE — ED Notes (Signed)
UDS completed 

## 2012-01-20 NOTE — ED Notes (Signed)
Pt cut left arm with razor knife, pt works at [employer], bleeding controlled, pt reports very little bleeding

## 2012-02-17 ENCOUNTER — Emergency Department (HOSPITAL_BASED_OUTPATIENT_CLINIC_OR_DEPARTMENT_OTHER)
Admission: EM | Admit: 2012-02-17 | Discharge: 2012-02-17 | Disposition: A | Discharge status: Home / Self Care | Payer: Managed Care, Other (non HMO) | Attending: Emergency Medicine | Admitting: Emergency Medicine

## 2012-02-17 ENCOUNTER — Encounter (HOSPITAL_BASED_OUTPATIENT_CLINIC_OR_DEPARTMENT_OTHER): Payer: Self-pay | Admitting: *Deleted

## 2012-02-17 DIAGNOSIS — Z8709 Personal history of other diseases of the respiratory system: Secondary | ICD-10-CM | POA: Insufficient documentation

## 2012-02-17 DIAGNOSIS — S61219A Laceration without foreign body of unspecified finger without damage to nail, initial encounter: Secondary | ICD-10-CM

## 2012-02-17 DIAGNOSIS — F329 Major depressive disorder, single episode, unspecified: Secondary | ICD-10-CM | POA: Insufficient documentation

## 2012-02-17 DIAGNOSIS — W268XXA Contact with other sharp object(s), not elsewhere classified, initial encounter: Secondary | ICD-10-CM | POA: Insufficient documentation

## 2012-02-17 DIAGNOSIS — Z7982 Long term (current) use of aspirin: Secondary | ICD-10-CM | POA: Insufficient documentation

## 2012-02-17 DIAGNOSIS — M129 Arthropathy, unspecified: Secondary | ICD-10-CM | POA: Insufficient documentation

## 2012-02-17 DIAGNOSIS — Z9884 Bariatric surgery status: Secondary | ICD-10-CM | POA: Insufficient documentation

## 2012-02-17 DIAGNOSIS — Y929 Unspecified place or not applicable: Secondary | ICD-10-CM | POA: Insufficient documentation

## 2012-02-17 DIAGNOSIS — F17200 Nicotine dependence, unspecified, uncomplicated: Secondary | ICD-10-CM | POA: Insufficient documentation

## 2012-02-17 DIAGNOSIS — I10 Essential (primary) hypertension: Secondary | ICD-10-CM | POA: Insufficient documentation

## 2012-02-17 DIAGNOSIS — S61209A Unspecified open wound of unspecified finger without damage to nail, initial encounter: Secondary | ICD-10-CM | POA: Insufficient documentation

## 2012-02-17 DIAGNOSIS — F3289 Other specified depressive episodes: Secondary | ICD-10-CM | POA: Insufficient documentation

## 2012-02-17 DIAGNOSIS — Y9389 Activity, other specified: Secondary | ICD-10-CM | POA: Insufficient documentation

## 2012-02-17 DIAGNOSIS — Z8742 Personal history of other diseases of the female genital tract: Secondary | ICD-10-CM | POA: Insufficient documentation

## 2012-02-17 MED ORDER — HYDROCODONE-ACETAMINOPHEN 10-325 MG PO TABS: 1.0000 | ORAL_TABLET | Freq: Four times a day (QID) | ORAL | Status: DC | PRN

## 2012-02-17 NOTE — ED Provider Notes (Signed)
History     CSN: [id number]  Arrival date & time 02/17/12  1453   First MD Initiated Contact with Patient 02/17/12 1510      Chief Complaint  Patient presents with  . Laceration    (Consider location/radiation/quality/duration/timing/severity/associated sxs/prior treatment) Patient is a 37 y.o. female presenting with skin laceration. The history is provided by the patient.  Laceration Location:  Finger Finger laceration location:  R little finger Depth:  Cutaneous Quality: avulsion and jagged   Bleeding: controlled   Laceration mechanism:  Metal edge Pain details:    Quality:  Aching and throbbing   Severity:  Mild   Timing:  Constant   Progression:  Unchanged Foreign body present:  No foreign bodies Relieved by:  Pressure Worsened by:  Nothing tried Tetanus status:  Up to date   Past Medical History  Diagnosis Date  . LAP-BAND surgery status   . Hypertension   . Bronchitis   . Arthritis   . Cervical dysplasia   . Depression     Past Surgical History  Procedure Laterality Date  . Laparoscopic gastric banding    . Carpal tunnel release    . Varicose vein surgery    . Cervical cone biopsy      No family history on file.  History  Substance Use Topics  . Smoking status: Current Every Day Smoker -- 0.50 packs/day    Types: Cigarettes  . Smokeless tobacco: Never Used  . Alcohol Use: No    OB History   Grav Para Term Preterm Abortions TAB SAB Ect Mult Living                  Review of Systems  Neurological: Negative for weakness and numbness.  All other systems reviewed and are negative.    Allergies  Demerol and Monosodium glutamate  Home Medications   Current Outpatient Rx  Name  Route  Sig  Dispense  Refill  . EXPIRED: albuterol (PROVENTIL HFA;VENTOLIN HFA) 108 (90 BASE) MCG/ACT inhaler   Inhalation   Inhale 2 puffs into the lungs every 4 (four) hours as needed for wheezing or shortness of breath (cough).   1 Inhaler   0   . aspirin  EC 81 MG tablet   Oral   Take 81 mg by mouth daily.          . calcium carbonate (TUMS - DOSED IN MG ELEMENTAL CALCIUM) 500 MG chewable tablet   Oral   Chew 2 tablets by mouth 2 (two) times daily as needed. For nausea         . cholecalciferol (VITAMIN D) 1000 UNITS tablet   Oral   Take 2,000 Units by mouth 2 (two) times daily.         . clonazePAM (KLONOPIN) 1 MG tablet   Oral   Take 1 mg by mouth 2 (two) times daily as needed. For anxiety         . cyanocobalamin (,VITAMIN B-12,) 1000 MCG/ML injection   Intramuscular   Inject 1,000 mcg into the muscle every 30 (thirty) days.         Marland Kitchen HYDROcodone-acetaminophen (NORCO) 10-325 MG per tablet   Oral   Take 1 tablet by mouth every 6 (six) hours as needed. pain         . ibuprofen (ADVIL,MOTRIN) 200 MG tablet   Oral   Take 800 mg by mouth 3 (three) times daily as needed. pain         .  Multiple Vitamin (MULITIVITAMIN WITH MINERALS) TABS   Oral   Take 1 tablet by mouth daily.           . predniSONE (DELTASONE) 10 MG tablet      5,4,3,2,1   15 tablet   0   . pyridoxine (B-6) 100 MG tablet   Oral   Take 100 mg by mouth daily.             BP 163/92  Pulse 74  Temp(Src) 98.4 F (36.9 C) (Oral)  Resp 16  Ht 5\' 3"  (1.6 m)  Wt 180 lb (81.647 kg)  BMI 31.89 kg/m2  SpO2 99%  LMP 12/21/2011  Physical Exam  Nursing note and vitals reviewed. Constitutional: She is oriented to person, place, and time. She appears well-developed and well-nourished. No distress.  HENT:  Head: Normocephalic and atraumatic.  Mouth/Throat: Oropharynx is clear and moist.  Eyes: Conjunctivae and EOM are normal. Pupils are equal, round, and reactive to light.  Cardiovascular: Intact distal pulses.   Pulmonary/Chest: Effort normal. No respiratory distress.  Musculoskeletal: Normal range of motion. She exhibits no edema and no tenderness.       Right hand: She exhibits tenderness and laceration. She exhibits normal range of  motion, normal capillary refill and no deformity. Normal sensation noted. Normal strength noted.       Hands: Laceration with avulsion of the skin of the right fifth finger. Normal tendon function of the digitorum superficialis and profundus. Normal sensation and capillary refill  Neurological: She is alert and oriented to person, place, and time.  Skin: Skin is warm and dry. No rash noted. No erythema.  Psychiatric: She has a normal mood and affect. Her behavior is normal.    ED Course  Procedures (including critical care time)  Labs Reviewed - No data to display No results found.  LACERATION REPAIR Performed by: Gwyneth Sprout Authorized byGwyneth Sprout Consent: Verbal consent obtained. Risks and benefits: risks, benefits and alternatives were discussed Consent given by: patient Patient identity confirmed: provided demographic data Prepped and Draped in normal sterile fashion Wound explored  Laceration Location: Right fifth finger  Laceration Length: 3 cm  No Foreign Bodies seen or palpated  Anesthesia: Digital block   Local anesthetic: lidocaine 1 % without epinephrine  Anesthetic total: 2 ml  Irrigation method: syringe Amount of cleaning: standard  Skin closure: 4.0 Prolene   Number of sutures: 5   Technique: Simple interrupted   Patient tolerance: Patient tolerated the procedure well with no immediate complications.   No diagnosis found.    MDM   She'll with a laceration to the finger. There are no tendon injuries. Nail is intact. Normal sensation and function of the finger. Tetanus shot within the last 2 years. Finger is anesthetized and wound repaired as above.       Gwyneth Sprout, MD 02/17/12 1546

## 2012-02-17 NOTE — ED Notes (Signed)
Pt amb to triage smiling and laughing in nad. Pt reports taking her dog out at 1:00 and her right pinky got caught on the metal in the sliding glass door. Pt has bandaid in place at triage, states she went to the [facility] and was told to come to ed for eval and sutures.

## 2012-03-15 ENCOUNTER — Emergency Department (HOSPITAL_BASED_OUTPATIENT_CLINIC_OR_DEPARTMENT_OTHER): Payer: Managed Care, Other (non HMO)

## 2012-03-15 ENCOUNTER — Encounter (HOSPITAL_BASED_OUTPATIENT_CLINIC_OR_DEPARTMENT_OTHER): Payer: Self-pay | Admitting: *Deleted

## 2012-03-15 ENCOUNTER — Emergency Department (HOSPITAL_BASED_OUTPATIENT_CLINIC_OR_DEPARTMENT_OTHER)
Admission: EM | Admit: 2012-03-15 | Discharge: 2012-03-15 | Disposition: A | Discharge status: Home / Self Care | Payer: Managed Care, Other (non HMO) | Attending: Emergency Medicine | Admitting: Emergency Medicine

## 2012-03-15 DIAGNOSIS — M549 Dorsalgia, unspecified: Secondary | ICD-10-CM | POA: Insufficient documentation

## 2012-03-15 DIAGNOSIS — Y939 Activity, unspecified: Secondary | ICD-10-CM | POA: Insufficient documentation

## 2012-03-15 DIAGNOSIS — IMO0002 Reserved for concepts with insufficient information to code with codable children: Secondary | ICD-10-CM | POA: Insufficient documentation

## 2012-03-15 DIAGNOSIS — S93409A Sprain of unspecified ligament of unspecified ankle, initial encounter: Secondary | ICD-10-CM | POA: Insufficient documentation

## 2012-03-15 DIAGNOSIS — S93401A Sprain of unspecified ligament of right ankle, initial encounter: Secondary | ICD-10-CM

## 2012-03-15 DIAGNOSIS — F17200 Nicotine dependence, unspecified, uncomplicated: Secondary | ICD-10-CM | POA: Insufficient documentation

## 2012-03-15 DIAGNOSIS — Z79899 Other long term (current) drug therapy: Secondary | ICD-10-CM | POA: Insufficient documentation

## 2012-03-15 DIAGNOSIS — G8929 Other chronic pain: Secondary | ICD-10-CM | POA: Insufficient documentation

## 2012-03-15 DIAGNOSIS — Z7982 Long term (current) use of aspirin: Secondary | ICD-10-CM | POA: Insufficient documentation

## 2012-03-15 DIAGNOSIS — I10 Essential (primary) hypertension: Secondary | ICD-10-CM | POA: Insufficient documentation

## 2012-03-15 DIAGNOSIS — F3289 Other specified depressive episodes: Secondary | ICD-10-CM | POA: Insufficient documentation

## 2012-03-15 DIAGNOSIS — Z8709 Personal history of other diseases of the respiratory system: Secondary | ICD-10-CM | POA: Insufficient documentation

## 2012-03-15 DIAGNOSIS — Z8739 Personal history of other diseases of the musculoskeletal system and connective tissue: Secondary | ICD-10-CM | POA: Insufficient documentation

## 2012-03-15 DIAGNOSIS — X500XXA Overexertion from strenuous movement or load, initial encounter: Secondary | ICD-10-CM | POA: Insufficient documentation

## 2012-03-15 DIAGNOSIS — Y929 Unspecified place or not applicable: Secondary | ICD-10-CM | POA: Insufficient documentation

## 2012-03-15 HISTORY — DX: Other chronic pain: M54.9

## 2012-03-15 HISTORY — DX: Other chronic pain: G89.29

## 2012-03-15 MED ORDER — HYDROCODONE-ACETAMINOPHEN 5-325 MG PO TABS: 1.0000 | ORAL_TABLET | Freq: Four times a day (QID) | ORAL | Status: DC | PRN

## 2012-03-15 MED ORDER — IBUPROFEN 400 MG PO TABS
600.0000 mg | ORAL_TABLET | Freq: Once | ORAL | Status: DC
  Filled 2012-03-15: qty 1

## 2012-03-15 NOTE — ED Notes (Signed)
Pt to room 7 in w/c, able to stand and pivot to bed on right le. Pt reports he dog pulled her to the ground in the grass while she was wearing her crocs last night. Pt states she took a lortab last night with no relief.

## 2012-03-15 NOTE — ED Provider Notes (Signed)
History     CSN: [id number]  Arrival date & time 03/15/12  1011   First MD Initiated Contact with Patient 03/15/12 1040      Chief Complaint  Patient presents with  . Fall  . Ankle Pain    (Consider location/radiation/quality/duration/timing/severity/associated sxs/prior treatment) Patient is a 37 y.o. female presenting with fall and ankle pain. The history is provided by the patient.  Fall Pertinent negatives include no fever and no numbness.  Ankle Pain Associated symptoms: no back pain, no fever and no neck pain   pt states her dog knocked her to ground yesterday evening. Twisted right ankle. C/o diffuse ankle pain, moderate, dull, non radiating. Can walk but painful. Skin intact. No numbness/weakness. No knee pain. Denies other injury.    Past Medical History  Diagnosis Date  . LAP-BAND surgery status   . Hypertension   . Bronchitis   . Arthritis   . Cervical dysplasia   . Depression   . Chronic back pain     Past Surgical History  Procedure Laterality Date  . Laparoscopic gastric banding    . Carpal tunnel release    . Varicose vein surgery    . Cervical cone biopsy      History reviewed. No pertinent family history.  History  Substance Use Topics  . Smoking status: Current Every Day Smoker -- 0.50 packs/day    Types: Cigarettes  . Smokeless tobacco: Never Used  . Alcohol Use: No    OB History   Grav Para Term Preterm Abortions TAB SAB Ect Mult Living                  Review of Systems  Constitutional: Negative for fever.  HENT: Negative for neck pain.   Musculoskeletal: Negative for back pain.  Skin: Negative for wound.  Neurological: Negative for numbness.    Allergies  Demerol and Monosodium glutamate  Home Medications   Current Outpatient Rx  Name  Route  Sig  Dispense  Refill  . PARoxetine (PAXIL) 10 MG tablet   Oral   Take 10 mg by mouth every morning.         Marland Kitchen EXPIRED: albuterol (PROVENTIL HFA;VENTOLIN HFA) 108 (90 BASE)  MCG/ACT inhaler   Inhalation   Inhale 2 puffs into the lungs every 4 (four) hours as needed for wheezing or shortness of breath (cough).   1 Inhaler   0   . aspirin EC 81 MG tablet   Oral   Take 81 mg by mouth daily.          . calcium carbonate (TUMS - DOSED IN MG ELEMENTAL CALCIUM) 500 MG chewable tablet   Oral   Chew 2 tablets by mouth 2 (two) times daily as needed. For nausea         . cholecalciferol (VITAMIN D) 1000 UNITS tablet   Oral   Take 2,000 Units by mouth 2 (two) times daily.         . clonazePAM (KLONOPIN) 1 MG tablet   Oral   Take 1 mg by mouth 2 (two) times daily as needed. For anxiety         . cyanocobalamin (,VITAMIN B-12,) 1000 MCG/ML injection   Intramuscular   Inject 1,000 mcg into the muscle every 30 (thirty) days.         Marland Kitchen HYDROcodone-acetaminophen (NORCO) 10-325 MG per tablet   Oral   Take 1 tablet by mouth every 6 (six) hours as needed. pain         .  HYDROcodone-acetaminophen (NORCO) 10-325 MG per tablet   Oral   Take 1 tablet by mouth every 6 (six) hours as needed for pain.   5 tablet   0   . ibuprofen (ADVIL,MOTRIN) 200 MG tablet   Oral   Take 800 mg by mouth 3 (three) times daily as needed. pain         . Multiple Vitamin (MULITIVITAMIN WITH MINERALS) TABS   Oral   Take 1 tablet by mouth daily.           . predniSONE (DELTASONE) 10 MG tablet      5,4,3,2,1   15 tablet   0   . pyridoxine (B-6) 100 MG tablet   Oral   Take 100 mg by mouth daily.             BP 124/59  Pulse 84  Temp(Src) 98 F (36.7 C) (Oral)  Resp 20  Ht 5\' 3"  (1.6 m)  Wt 175 lb (79.379 kg)  BMI 31.01 kg/m2  SpO2 100%  LMP 03/11/2012  Physical Exam  Nursing note and vitals reviewed. Constitutional: She is oriented to person, place, and time. She appears well-developed and well-nourished. No distress.  HENT:  Head: Atraumatic.  Eyes: Conjunctivae are normal.  Neck: Neck supple. No tracheal deviation present.  Cardiovascular:  Normal rate.   Pulmonary/Chest: Effort normal. No respiratory distress.  Abdominal: Normal appearance.  Musculoskeletal:  Tenderness and mild sts lat and med mall right ankle. Distal pulses palp. Normal cap refill. Skin intact. Ankle stable. No prox tib fib or knee tenderness.   Neurological: She is alert and oriented to person, place, and time.  Foot nvi.   Skin: Skin is warm and dry. No rash noted.  Psychiatric: She has a normal mood and affect.    ED Course  Procedures (including critical care time)  Dg Ankle Complete Right  03/15/2012  *RADIOLOGY REPORT*  Clinical Data: Right ankle pain after injury.  RIGHT ANKLE - COMPLETE 3+ VIEW  Comparison: None.  Findings: No fracture or dislocation is noted.  Joint spaces are intact. Soft tissue swelling is seen over lateral malleolus suggesting ligamentous injury.  IMPRESSION: No fracture or dislocation is noted in right ankle.   Original Report Authenticated By: Lupita Raider.,  M.D.        MDM  Xrays. Motrin po.  Reviewed nursing notes and prior charts for additional history.   Discussed xrays  w pt. Pt asks for pain rx for home.  aso brace applied by staff.          Suzi Roots, MD 03/15/12 [phone]

## 2012-03-15 NOTE — ED Notes (Signed)
Per pt request, ASO changed out for smaller size by Weston Brass, EMT. Pt states "I don't feel like this is giving me any support..." ASO removed and tightened, pt cont demand smaller size. Large sized ASO removed, medium placed. Pt verbalizes understanding of importance of loosening ASO if it feels too tight.

## 2012-05-12 ENCOUNTER — Emergency Department (HOSPITAL_COMMUNITY): Payer: Managed Care, Other (non HMO)

## 2012-05-12 ENCOUNTER — Encounter (HOSPITAL_COMMUNITY): Payer: Self-pay

## 2012-05-12 ENCOUNTER — Telehealth (INDEPENDENT_AMBULATORY_CARE_PROVIDER_SITE_OTHER): Payer: Self-pay | Admitting: General Surgery

## 2012-05-12 ENCOUNTER — Emergency Department (HOSPITAL_COMMUNITY)
Admission: EM | Admit: 2012-05-12 | Discharge: 2012-05-12 | Disposition: A | Discharge status: Home / Self Care | Payer: Managed Care, Other (non HMO) | Attending: Emergency Medicine | Admitting: Emergency Medicine

## 2012-05-12 DIAGNOSIS — F3289 Other specified depressive episodes: Secondary | ICD-10-CM | POA: Insufficient documentation

## 2012-05-12 DIAGNOSIS — F17200 Nicotine dependence, unspecified, uncomplicated: Secondary | ICD-10-CM | POA: Insufficient documentation

## 2012-05-12 DIAGNOSIS — Z8742 Personal history of other diseases of the female genital tract: Secondary | ICD-10-CM | POA: Insufficient documentation

## 2012-05-12 DIAGNOSIS — R112 Nausea with vomiting, unspecified: Secondary | ICD-10-CM | POA: Insufficient documentation

## 2012-05-12 DIAGNOSIS — M549 Dorsalgia, unspecified: Secondary | ICD-10-CM | POA: Insufficient documentation

## 2012-05-12 DIAGNOSIS — G8929 Other chronic pain: Secondary | ICD-10-CM | POA: Insufficient documentation

## 2012-05-12 DIAGNOSIS — F329 Major depressive disorder, single episode, unspecified: Secondary | ICD-10-CM | POA: Insufficient documentation

## 2012-05-12 DIAGNOSIS — I10 Essential (primary) hypertension: Secondary | ICD-10-CM | POA: Insufficient documentation

## 2012-05-12 DIAGNOSIS — R109 Unspecified abdominal pain: Secondary | ICD-10-CM | POA: Insufficient documentation

## 2012-05-12 DIAGNOSIS — M129 Arthropathy, unspecified: Secondary | ICD-10-CM | POA: Insufficient documentation

## 2012-05-12 DIAGNOSIS — Z3202 Encounter for pregnancy test, result negative: Secondary | ICD-10-CM | POA: Insufficient documentation

## 2012-05-12 DIAGNOSIS — R110 Nausea: Secondary | ICD-10-CM

## 2012-05-12 DIAGNOSIS — Z79899 Other long term (current) drug therapy: Secondary | ICD-10-CM | POA: Insufficient documentation

## 2012-05-12 DIAGNOSIS — Z791 Long term (current) use of non-steroidal anti-inflammatories (NSAID): Secondary | ICD-10-CM | POA: Insufficient documentation

## 2012-05-12 DIAGNOSIS — Z8709 Personal history of other diseases of the respiratory system: Secondary | ICD-10-CM | POA: Insufficient documentation

## 2012-05-12 DIAGNOSIS — Z9884 Bariatric surgery status: Secondary | ICD-10-CM | POA: Insufficient documentation

## 2012-05-12 LAB — CBC WITH DIFFERENTIAL/PLATELET
Basophils Absolute: 0 10*3/uL (ref 0.0–0.1)
Basophils Relative: 0 % (ref 0–1)
Eosinophils Absolute: 0 10*3/uL (ref 0.0–0.7)
Eosinophils Relative: 0 % (ref 0–5)
HCT: 38.3 % (ref 36.0–46.0)
Hemoglobin: 12.6 g/dL (ref 12.0–15.0)
Lymphocytes Relative: 29 % (ref 12–46)
Lymphs Abs: 1.8 10*3/uL (ref 0.7–4.0)
MCH: 28 pg (ref 26.0–34.0)
MCHC: 32.9 g/dL (ref 30.0–36.0)
MCV: 85.1 fL (ref 78.0–100.0)
Monocytes Absolute: 0.5 10*3/uL (ref 0.1–1.0)
Monocytes Relative: 8 % (ref 3–12)
Neutro Abs: 4 10*3/uL (ref 1.7–7.7)
Neutrophils Relative %: 63 % (ref 43–77)
Platelets: 225 10*3/uL (ref 150–400)
RBC: 4.5 MIL/uL (ref 3.87–5.11)
RDW: 14 % (ref 11.5–15.5)
WBC: 6.3 10*3/uL (ref 4.0–10.5)

## 2012-05-12 LAB — URINALYSIS, ROUTINE W REFLEX MICROSCOPIC
Glucose, UA: NEGATIVE mg/dL
Ketones, ur: NEGATIVE mg/dL
Leukocytes, UA: NEGATIVE
Nitrite: NEGATIVE
Protein, ur: NEGATIVE mg/dL
Specific Gravity, Urine: 1.025 (ref 1.005–1.030)
Urobilinogen, UA: 0.2 mg/dL (ref 0.0–1.0)
pH: 7.5 (ref 5.0–8.0)

## 2012-05-12 LAB — COMPREHENSIVE METABOLIC PANEL
ALT: 15 U/L (ref 0–35)
AST: 23 U/L (ref 0–37)
Albumin: 3.6 g/dL (ref 3.5–5.2)
Alkaline Phosphatase: 47 U/L (ref 39–117)
BUN: 18 mg/dL (ref 6–23)
CO2: 29 mEq/L (ref 19–32)
Calcium: 9.7 mg/dL (ref 8.4–10.5)
Chloride: 105 mEq/L (ref 96–112)
Creatinine, Ser: 0.68 mg/dL (ref 0.50–1.10)
GFR calc Af Amer: >90 mL/min (ref >90)
GFR calc non Af Amer: >90 mL/min (ref >90)
Glucose, Bld: 76 mg/dL (ref 70–99)
Potassium: 4 mEq/L (ref 3.5–5.1)
Sodium: 142 mEq/L (ref 135–145)
Total Bilirubin: 0.6 mg/dL (ref 0.3–1.2)
Total Protein: 6.9 g/dL (ref 6.0–8.3)

## 2012-05-12 LAB — URINE MICROSCOPIC-ADD ON

## 2012-05-12 LAB — PREGNANCY, URINE: Preg Test, Ur: NEGATIVE

## 2012-05-12 LAB — LIPASE, BLOOD: Lipase: 39 U/L (ref 11–59)

## 2012-05-12 MED ORDER — ACETAMINOPHEN 325 MG PO TABS: 650.0000 mg | ORAL_TABLET | Freq: Once | ORAL | Status: DC

## 2012-05-12 MED ORDER — SODIUM CHLORIDE 0.9 % IV SOLN
1000.0000 mL | INTRAVENOUS | Status: DC
  Administered 2012-05-12: 1000 mL via INTRAVENOUS

## 2012-05-12 MED ORDER — FAMOTIDINE IN NACL 20-0.9 MG/50ML-% IV SOLN
20.0000 mg | Freq: Once | INTRAVENOUS | Status: AC
  Administered 2012-05-12: 20 mg via INTRAVENOUS
  Filled 2012-05-12: qty 50

## 2012-05-12 NOTE — ED Notes (Signed)
Pt has Lap Band surgery 2009.  Pt has had one other episode "like this" 2009 "need loosen the band".  No problems since.  Pt has has reflux for 3 weeks.  Pt has Not been able to eat or drink for 3 days because of LAP BAND. Marland Kitchen  Pt presents in no acute distress.

## 2012-05-12 NOTE — ED Provider Notes (Signed)
History     CSN: [id number]  Arrival date & time 05/12/12  1723   First MD Initiated Contact with Patient 05/12/12 1901      Chief Complaint  Patient presents with  . Abdominal Pain  . Emesis    (Consider location/radiation/quality/duration/timing/severity/associated sxs/prior treatment) HPI  Pt is a 37 yo F PMHx significant for laparoscopic gastric banding placement in 2009 presenting w/ three days of GERD symptoms, and decreased PO intake d/t abdominal discomfort. Pt states she has had a similar episode of this in the past and it occurred when her lap band needed modifying by her general surgeon. Pt had procedure done in [place], [place] and has recently moved here was given referral to Surgeon in the area, but when she consulted that office she was advised to come to the ED. Alleviating factors: none. Aggravating factors: eating. Denies abdominal pain, nausea, vomiting, diarrhea, CP, SOB.   Past Medical History  Diagnosis Date  . LAP-BAND surgery status   . Hypertension   . Bronchitis   . Arthritis   . Cervical dysplasia   . Depression   . Chronic back pain     Past Surgical History  Procedure Laterality Date  . Laparoscopic gastric banding    . Carpal tunnel release    . Varicose vein surgery    . Cervical cone biopsy      No family history on file.  History  Substance Use Topics  . Smoking status: Current Every Day Smoker -- 0.50 packs/day    Types: Cigarettes  . Smokeless tobacco: Never Used  . Alcohol Use: No    OB History   Grav Para Term Preterm Abortions TAB SAB Ect Mult Living                  Review of Systems  Constitutional: Negative for fever and chills.  Respiratory: Negative for shortness of breath.   Cardiovascular: Negative for chest pain.  Gastrointestinal: Negative for nausea, vomiting, abdominal pain, diarrhea and constipation.  All other systems reviewed and are negative.    Allergies  Demerol and Monosodium glutamate  Home  Medications   Current Outpatient Rx  Name  Route  Sig  Dispense  Refill  . albuterol (PROVENTIL HFA;VENTOLIN HFA) 108 (90 BASE) MCG/ACT inhaler   Inhalation   Inhale 2 puffs into the lungs every 4 (four) hours as needed for wheezing or shortness of breath (cough).         . calcium carbonate (TUMS - DOSED IN MG ELEMENTAL CALCIUM) 500 MG chewable tablet   Oral   Chew 2 tablets by mouth 2 (two) times daily as needed. For nausea         . cholecalciferol (VITAMIN D) 1000 UNITS tablet   Oral   Take 2,000 Units by mouth 2 (two) times daily.         . clonazePAM (KLONOPIN) 1 MG tablet   Oral   Take 1 mg by mouth 2 (two) times daily as needed. For anxiety         . cyanocobalamin (,VITAMIN B-12,) 1000 MCG/ML injection   Intramuscular   Inject 1,000 mcg into the muscle every 14 (fourteen) days.          Marland Kitchen HYDROcodone-acetaminophen (NORCO) 10-325 MG per tablet   Oral   Take 1 tablet by mouth every 6 (six) hours as needed for pain. pain         . Multiple Vitamin (MULITIVITAMIN WITH MINERALS) TABS   Oral  Take 1 tablet by mouth daily.           . naproxen sodium (ANAPROX) 220 MG tablet   Oral   Take 220 mg by mouth 2 (two) times daily with a meal.         . PARoxetine (PAXIL) 20 MG tablet   Oral   Take 20 mg by mouth every morning.           BP 122/77  Pulse 62  Temp(Src) 97.8 F (36.6 C) (Oral)  Resp 17  Ht 5\' 3"  (1.6 m)  Wt 162 lb (73.483 kg)  BMI 28.7 kg/m2  SpO2 95%  LMP 05/06/2012  Physical Exam  Constitutional: She is oriented to person, place, and time. She appears well-developed and well-nourished.  HENT:  Head: Normocephalic and atraumatic.  Eyes: EOM are normal. Pupils are equal, round, and reactive to light.  Neck: Neck supple.  Cardiovascular: Normal rate, regular rhythm and normal heart sounds.   Pulmonary/Chest: Effort normal and breath sounds normal.  Abdominal: Soft. Bowel sounds are normal. She exhibits no distension. There is no  tenderness. There is no rebound and no guarding.  Lap band appreciated on palpation.   Neurological: She is alert and oriented to person, place, and time.  Skin: Skin is warm and dry.  Psychiatric: She has a normal mood and affect.    ED Course  Procedures (including critical care time)  Medications  0.9 %  sodium chloride infusion (0 mLs Intravenous Stopped 05/12/12 2227)  acetaminophen (TYLENOL) tablet 650 mg (650 mg Oral Not Given 05/12/12 2234)  famotidine (PEPCID) IVPB 20 mg (0 mg Intravenous Stopped 05/12/12 2227)     Labs Reviewed  URINALYSIS, ROUTINE W REFLEX MICROSCOPIC - Abnormal; Notable for the following:    Hgb urine dipstick SMALL (*)    Bilirubin Urine SMALL (*)    All other components within normal limits  URINE MICROSCOPIC-ADD ON - Abnormal; Notable for the following:    Squamous Epithelial / LPF FEW (*)    Bacteria, UA FEW (*)    All other components within normal limits  CBC WITH DIFFERENTIAL  COMPREHENSIVE METABOLIC PANEL  LIPASE, BLOOD  PREGNANCY, URINE   Dg Abd Acute W/chest  05/12/2012  *RADIOLOGY REPORT*  Clinical Data: Abdominal pain, Lab and  ACUTE ABDOMEN SERIES (ABDOMEN 2 VIEW & CHEST 1 VIEW)  Comparison: Chest radiograph 11/25/2010  Findings: Normal cardiac silhouette.  Lungs are clear. Calcified granuloma in the left upper lobe.  No free air beneath hemidiaphragm.  There is a gastric banding device in gastric cardiac region in a 10 o'clock to 4 o'clock orientation.  The access tubing port is in the central left abdomen.  The tubing is continuous.  No dilated loops of large or small bowel.  No pathologic calcifications.  IMPRESSION:  1.  No acute cardiopulmonary process.  2. No evidence of bowel obstruction or intraperitoneal free air. 3.  Gastric banding device in a 10 o'clock to 4 o'clock orientation which is atypical.   Original Report Authenticated By: Genevive Bi, M.D.    General Surgeon consulted. Will follow up with patient tomorrow morning at 11AM  in office.   1. Hx of laparoscopic gastric banding   2. Nausea       MDM  Pt presenting for concern about lap band issue. States having similar symptoms to prior lap band problem in past, three days of GERD like symptoms and decreased PO intake. Abdomen S/NT/ND w/ BS x 4. Acute Abdomen Series confirmed  abnormal placement of lap band. General surgeon consulted and will follow up with patient tomorrow at 11AM in office. Patient agreeable to plan. Patient d/w with Dr. Juleen China, agrees with plan. Patient is stable at time of discharge          Jeannetta Ellis, PA-C 05/13/12 0124

## 2012-05-12 NOTE — Telephone Encounter (Signed)
Pt called to ask about lap band placed in [place] in 2009.  She reports that nothing if passing into her stomach, no solids or liquids.  She has not been seen at [facility].  Informed her of the procedure to transfer care to one of the bariatric physicians here and gave her the FAX number to have her records sent for review.  She understands all and to go to [facility] if she gets worse.  FYI.

## 2012-05-12 NOTE — ED Provider Notes (Addendum)
Medical screening examination/treatment/procedure(s) were conducted as a shared visit with non-physician practitioner(s) and myself.  I personally evaluated the patient during the encounter.  37yF with reflux symptoms and nausea. S/p lap band 2009 in [place]. Similar symptoms with previous issues. No local surgeon. Benign abdominal exam. Appears malpositioned on XR. Discussed with charge nurse to get needle to bedside. Surgery paged.   Raeford Razor, MD 05/12/12 2145   10:20 PM Discussed with Dr Donell Beers, surgery. Pt has appointment tomorrow at 1100 with Dr Andrey Campanile. Pt instructed that needs to arrive by 1030.  Raeford Razor, MD 05/12/12 2222

## 2012-05-13 ENCOUNTER — Ambulatory Visit (INDEPENDENT_AMBULATORY_CARE_PROVIDER_SITE_OTHER): Payer: Managed Care, Other (non HMO) | Admitting: General Surgery

## 2012-05-13 ENCOUNTER — Encounter (INDEPENDENT_AMBULATORY_CARE_PROVIDER_SITE_OTHER): Payer: Self-pay | Admitting: General Surgery

## 2012-05-13 VITALS — BP 124/80 | HR 100 | Temp 98.9°F | Resp 16 | Ht 63.0 in | Wt 157.0 lb

## 2012-05-13 DIAGNOSIS — Z4651 Encounter for fitting and adjustment of gastric lap band: Secondary | ICD-10-CM

## 2012-05-13 DIAGNOSIS — R110 Nausea: Secondary | ICD-10-CM

## 2012-05-13 NOTE — Patient Instructions (Signed)

## 2012-05-13 NOTE — Progress Notes (Addendum)
Patient ID: Samantha Doyle, female   DOB: December 17, 1975, 37 y.o.   MRN: [id number]  Chief Complaint  Patient presents with  . Nausea    HPI Samantha Doyle is a 37 y.o. female.   HPI 37 yo WF s/p LAPBAND (AP-S) in march 2009 by Dr Marcial Pacas Hipp at [facility] is referred by Dr because of po intolerance since Monday. The patient states that she had her lap band put in in March 2009. Several months later she had some swelling around her lap band and her band was deflated for 2 months. In January 2010 she reports that they slowly adequate fluids back to her lap band. She states that she did quite well with weight loss. Her initial preoperative weight was 338 pounds. She states that she suffered from obstructive sleep apnea on CPAP, hypertension, and was considered a prediabetic before surgery. As a result of weight loss she no longer uses CPAP or takes blood pressure medications. She states that she last saw her surgeon about 2 years ago. She has been in the [place] area for at least a year. She states that she plans to stay in the area. She states that she was doing well until Sunday when she started to have some intolerance to solids. On Monday she can only get down fluids. Since Monday evening she has been unable to drink anything. She reports some worsening heartburn over the past 3 weeks. She denies any regurgitation. She states that she normally has problems with real thick bread or pastas. She states that she normally chews her food very thoroughly and take small bites at a time. She went to the emergency room yesterday because of intolerance to liquids and solids. She had labs and abdominal x-ray Past Medical History  Diagnosis Date  . LAP-BAND surgery status   . Hypertension   . Bronchitis   . Arthritis   . Cervical dysplasia   . Depression   . Chronic back pain     Past Surgical History  Procedure Laterality Date  . Laparoscopic gastric banding  03/2007    APS - [facility]; Dr Jorja Loa Hipp  . Carpal tunnel release    . Varicose vein surgery    . Cervical cone biopsy      History reviewed. No pertinent family history.  Social History History  Substance Use Topics  . Smoking status: Current Every Day Smoker -- 0.50 packs/day    Types: Cigarettes  . Smokeless tobacco: Never Used  . Alcohol Use: No    Allergies  Allergen Reactions  . Demerol Hives and Nausea And Vomiting  . Monosodium Glutamate Hives, Nausea And Vomiting and Other (See Comments)    migraines    Current Outpatient Prescriptions  Medication Sig Dispense Refill  . albuterol (PROVENTIL HFA;VENTOLIN HFA) 108 (90 BASE) MCG/ACT inhaler Inhale 2 puffs into the lungs every 4 (four) hours as needed for wheezing or shortness of breath (cough).      . calcium carbonate (TUMS - DOSED IN MG ELEMENTAL CALCIUM) 500 MG chewable tablet Chew 2 tablets by mouth 2 (two) times daily as needed. For nausea      . cholecalciferol (VITAMIN D) 1000 UNITS tablet Take 2,000 Units by mouth 2 (two) times daily.      . clonazePAM (KLONOPIN) 1 MG tablet Take 1 mg by mouth 2 (two) times daily as needed. For anxiety      . cyanocobalamin (,VITAMIN B-12,) 1000 MCG/ML injection Inject 1,000 mcg into  the muscle every 14 (fourteen) days.       . Multiple Vitamin (MULITIVITAMIN WITH MINERALS) TABS Take 1 tablet by mouth daily.        . naproxen sodium (ANAPROX) 220 MG tablet Take 220 mg by mouth 2 (two) times daily with a meal.      . PARoxetine (PAXIL) 20 MG tablet Take 20 mg by mouth every morning.      Marland Kitchen HYDROcodone-acetaminophen (NORCO) 10-325 MG per tablet Take 1 tablet by mouth every 6 (six) hours as needed for pain. pain       No current facility-administered medications for this visit.    Review of Systems Review of Systems  Constitutional: Positive for fatigue. Negative for fever, activity change and appetite change.  HENT: Negative for hearing loss.   Respiratory: Negative for chest  tightness, shortness of breath and wheezing.   Cardiovascular: Negative for chest pain and leg swelling.  Gastrointestinal: Positive for nausea. Negative for abdominal pain, diarrhea and constipation.  Genitourinary: Negative for difficulty urinating.  Musculoskeletal: Back pain: chronic back pain, takes narcotics.  Neurological: Negative for dizziness, seizures and numbness.  Hematological: Negative for adenopathy.  Psychiatric/Behavioral: Negative for behavioral problems.  All other systems reviewed and are negative.    Blood pressure 124/80, pulse 100, temperature 98.9 F (37.2 C), temperature source Oral, resp. rate 16, height 5\' 3"  (1.6 m), weight 157 lb (71.215 kg), last menstrual period 05/06/2012.  Physical Exam Physical Exam  Vitals reviewed. Constitutional: She is oriented to person, place, and time. She appears well-developed and well-nourished. No distress.  HENT:  Head: Normocephalic and atraumatic.  Right Ear: External ear normal.  Left Ear: External ear normal.  Eyes: Conjunctivae are normal. No scleral icterus.  Neck: Neck supple. No tracheal deviation present. No thyromegaly present.  Cardiovascular: Normal rate, regular rhythm and normal heart sounds.   Pulmonary/Chest: Effort normal and breath sounds normal. No respiratory distress. She has no wheezes.  Abdominal: Soft. She exhibits no distension. There is no tenderness. There is no rebound.    Redundant skin; port in mid abdomen  Musculoskeletal: She exhibits no edema and no tenderness.  Neurological: She is alert and oriented to person, place, and time.  Skin: Skin is warm and dry. No rash noted. She is not diaphoretic. No erythema. No pallor.  Psychiatric: She has a normal mood and affect. Her behavior is normal. Judgment and thought content normal.    Data Reviewed ED note Abd xray - lap band appears horizontal  nml CMET, CBC  Assessment    Probable Lap Band Slipp     Plan    In reviewing her  abdominal x-ray it appears that her lap band has slipped. It definitely is in an abnormal position. However it would be interesting to compare to her prior radiographs. It is possible this is its chronic position since she had a problem in fall 2009.  Nonetheless I recommended to deflating her band.  After obtaining verbal consent, the abdominal wall was prepped with Chloraprep. The port was accessed with a Huber needle and 4 cc of saline was removed to give the patient an expected fill volume of 0 cc.  The patient was able to tolerate sips of water.  She was instructed to stay on a liquid diet for the next 2 days and then a soft diet  In the interim we will obtain her records from [place]. She will followup in 4-6 weeks. It is highly likely she will need to have  her LapBand repositioned in the operating room which can be done in several weeks. She was instructed to call for ongoing food intolerance or return of abd pain.   Mary Sella. Andrey Campanile, MD, FACS General, Bariatric, & Minimally Invasive Surgery [facility], [place]         [facility] M 05/13/2012, 11:23 AM

## 2012-05-15 ENCOUNTER — Telehealth (INDEPENDENT_AMBULATORY_CARE_PROVIDER_SITE_OTHER): Payer: Self-pay | Admitting: Surgery

## 2012-05-15 ENCOUNTER — Other Ambulatory Visit (INDEPENDENT_AMBULATORY_CARE_PROVIDER_SITE_OTHER): Payer: Self-pay | Admitting: Surgery

## 2012-05-15 MED ORDER — PROMETHAZINE HCL 12.5 MG PO TABS: 12.5000 mg | ORAL_TABLET | Freq: Four times a day (QID) | ORAL | Status: DC | PRN

## 2012-05-15 MED ORDER — PROMETHAZINE HCL 25 MG RE SUPP: 25.0000 mg | Freq: Four times a day (QID) | RECTAL | Status: DC | PRN

## 2012-05-15 NOTE — Telephone Encounter (Signed)
Patient called with complaints of nausea and questionable pseudo-reflux.  She had a lap band placed in [place] in 2009.  Relocated here.  She saw Dr. Andrey Campanile two days ago.  Fluid removed from band.  Concern of possible slipped band.  Patient notes now she cannot advanced to crackers.  Feels like her protein shakes are sitting and then comes back up a couple hours later.  Worse when she lies down flat.  I recommended warm liquids.  30 cc an hour max.  Recommend walking an hour a day.  I recommended Gas-X or Maalox to help with any bloated feelings.  I sent an e-prescription for Phenergan by mouth and suppositories.  I asked her to call back when the weekend is over in 36 hours.  If she gets markedly worse, go the emergency room.  If she gets better, call early next week to see what other things need to be done.  She thought [place] records would be available.  I cannot find in the computer.  I asked her to call next week and see if we can get a more comprehensive plan.  Questions were answered.  She expressed understanding and appreciation to

## 2012-05-16 ENCOUNTER — Emergency Department (HOSPITAL_COMMUNITY): Payer: Managed Care, Other (non HMO)

## 2012-05-16 ENCOUNTER — Encounter (HOSPITAL_COMMUNITY): Payer: Self-pay | Admitting: *Deleted

## 2012-05-16 ENCOUNTER — Observation Stay (HOSPITAL_COMMUNITY)
Admission: EM | Admit: 2012-05-16 | Discharge: 2012-05-19 | Disposition: A | Discharge status: Home / Self Care | Payer: Managed Care, Other (non HMO) | Attending: General Surgery | Admitting: General Surgery

## 2012-05-16 DIAGNOSIS — E876 Hypokalemia: Secondary | ICD-10-CM

## 2012-05-16 DIAGNOSIS — E860 Dehydration: Secondary | ICD-10-CM

## 2012-05-16 DIAGNOSIS — R112 Nausea with vomiting, unspecified: Secondary | ICD-10-CM

## 2012-05-16 DIAGNOSIS — I10 Essential (primary) hypertension: Secondary | ICD-10-CM | POA: Insufficient documentation

## 2012-05-16 DIAGNOSIS — Z9884 Bariatric surgery status: Secondary | ICD-10-CM

## 2012-05-16 DIAGNOSIS — F17200 Nicotine dependence, unspecified, uncomplicated: Secondary | ICD-10-CM | POA: Insufficient documentation

## 2012-05-16 DIAGNOSIS — K9509 Other complications of gastric band procedure: Principal | ICD-10-CM | POA: Insufficient documentation

## 2012-05-16 DIAGNOSIS — Y831 Surgical operation with implant of artificial internal device as the cause of abnormal reaction of the patient, or of later complication, without mention of misadventure at the time of the procedure: Secondary | ICD-10-CM | POA: Insufficient documentation

## 2012-05-16 DIAGNOSIS — Z79899 Other long term (current) drug therapy: Secondary | ICD-10-CM | POA: Insufficient documentation

## 2012-05-16 LAB — CBC WITH DIFFERENTIAL/PLATELET
Basophils Absolute: 0 10*3/uL (ref 0.0–0.1)
Basophils Relative: 0 % (ref 0–1)
Eosinophils Relative: 2 % (ref 0–5)
Eosinophils Relative: 3 % (ref 0–5)
HCT: 40.2 % (ref 36.0–46.0)
HCT: 35.8 % — ABNORMAL LOW (ref 36.0–46.0)
Hemoglobin: 13.4 g/dL (ref 12.0–15.0)
Hemoglobin: 11.8 g/dL — ABNORMAL LOW (ref 12.0–15.0)
Lymphocytes Relative: 24 % (ref 12–46)
Lymphs Abs: 1.5 10*3/uL (ref 0.7–4.0)
MCH: 28.2 pg (ref 26.0–34.0)
MCHC: 33 g/dL (ref 30.0–36.0)
MCV: 84.3 fL (ref 78.0–100.0)
MCV: 85.4 fL (ref 78.0–100.0)
Monocytes Absolute: 0.7 10*3/uL (ref 0.1–1.0)
Monocytes Absolute: 0.6 10*3/uL (ref 0.1–1.0)
Monocytes Relative: 11 % (ref 3–12)
Monocytes Relative: 11 % (ref 3–12)
Neutro Abs: 3.9 10*3/uL (ref 1.7–7.7)
Neutro Abs: 2.6 10*3/uL (ref 1.7–7.7)
RBC: 4.77 MIL/uL (ref 3.87–5.11)
RDW: 13.8 % (ref 11.5–15.5)
WBC: 6.3 10*3/uL (ref 4.0–10.5)

## 2012-05-16 LAB — COMPREHENSIVE METABOLIC PANEL
ALT: 11 U/L (ref 0–35)
Albumin: 3.7 g/dL (ref 3.5–5.2)
Albumin: 3.2 g/dL — ABNORMAL LOW (ref 3.5–5.2)
Alkaline Phosphatase: 50 U/L (ref 39–117)
BUN: 16 mg/dL (ref 6–23)
BUN: 15 mg/dL (ref 6–23)
Calcium: 9.6 mg/dL (ref 8.4–10.5)
Calcium: 8.6 mg/dL (ref 8.4–10.5)
Chloride: 103 mEq/L (ref 96–112)
Creatinine, Ser: 0.68 mg/dL (ref 0.50–1.10)
GFR calc Af Amer: >90 mL/min (ref >90)
GFR calc non Af Amer: >90 mL/min (ref >90)
Glucose, Bld: 82 mg/dL (ref 70–99)
Potassium: 3.1 mEq/L — ABNORMAL LOW (ref 3.5–5.1)
Sodium: 141 mEq/L (ref 135–145)
Total Bilirubin: 0.8 mg/dL (ref 0.3–1.2)
Total Protein: 7.2 g/dL (ref 6.0–8.3)

## 2012-05-16 LAB — LIPASE, BLOOD: Lipase: 33 U/L (ref 11–59)

## 2012-05-16 LAB — PREGNANCY, URINE: Preg Test, Ur: NEGATIVE

## 2012-05-16 MED ORDER — MORPHINE SULFATE 2 MG/ML IJ SOLN
2.0000 mg | INTRAMUSCULAR | Status: DC | PRN
  Administered 2012-05-16 – 2012-05-17 (×2): 2 mg via INTRAVENOUS
  Filled 2012-05-16 (×2): qty 1

## 2012-05-16 MED ORDER — CHLORHEXIDINE GLUCONATE 4 % EX LIQD
Freq: Once | CUTANEOUS | Status: AC
  Administered 2012-05-16: 21:00:00 via TOPICAL
  Filled 2012-05-16: qty 60

## 2012-05-16 MED ORDER — ONDANSETRON HCL 4 MG/2ML IJ SOLN
4.0000 mg | Freq: Once | INTRAMUSCULAR | Status: AC
  Administered 2012-05-16: 4 mg via INTRAVENOUS
  Filled 2012-05-16: qty 2

## 2012-05-16 MED ORDER — HEPARIN SODIUM (PORCINE) 5000 UNIT/ML IJ SOLN
5000.0000 [IU] | Freq: Once | INTRAMUSCULAR | Status: AC
  Administered 2012-05-16: 5000 [IU] via SUBCUTANEOUS
  Filled 2012-05-16: qty 1

## 2012-05-16 MED ORDER — IOHEXOL 300 MG/ML  SOLN
100.0000 mL | Freq: Once | INTRAMUSCULAR | Status: AC | PRN
  Administered 2012-05-16: 100 mL via INTRAVENOUS

## 2012-05-16 MED ORDER — IOHEXOL 300 MG/ML  SOLN: 100.0000 mL | Freq: Once | INTRAMUSCULAR | Status: DC | PRN

## 2012-05-16 MED ORDER — DEXTROSE 5 % IV SOLN
2.0000 g | INTRAVENOUS | Status: DC
  Filled 2012-05-16: qty 2

## 2012-05-16 MED ORDER — POTASSIUM CHLORIDE 2 MEQ/ML IV SOLN: INTRAVENOUS | Status: DC

## 2012-05-16 MED ORDER — KCL IN DEXTROSE-NACL 20-5-0.45 MEQ/L-%-% IV SOLN
INTRAVENOUS | Status: DC
  Administered 2012-05-16: 20:00:00 via INTRAVENOUS
  Administered 2012-05-18: 50 mL via INTRAVENOUS
  Administered 2012-05-18: 05:00:00 via INTRAVENOUS
  Filled 2012-05-16 (×8): qty 1000

## 2012-05-16 MED ORDER — POTASSIUM CHLORIDE 10 MEQ/100ML IV SOLN
10.0000 meq | Freq: Once | INTRAVENOUS | Status: AC
  Administered 2012-05-16: 10 meq via INTRAVENOUS
  Filled 2012-05-16: qty 100

## 2012-05-16 MED ORDER — HEPARIN SODIUM (PORCINE) 5000 UNIT/ML IJ SOLN: 5000.0000 [IU] | Freq: Once | INTRAMUSCULAR | Status: DC

## 2012-05-16 MED ORDER — ALBUTEROL SULFATE HFA 108 (90 BASE) MCG/ACT IN AERS
2.0000 | INHALATION_SPRAY | RESPIRATORY_TRACT | Status: DC | PRN
  Filled 2012-05-16: qty 6.7

## 2012-05-16 MED ORDER — PANTOPRAZOLE SODIUM 40 MG IV SOLR
40.0000 mg | Freq: Once | INTRAVENOUS | Status: AC
  Administered 2012-05-16: 40 mg via INTRAVENOUS
  Filled 2012-05-16: qty 40

## 2012-05-16 MED ORDER — HYDROMORPHONE HCL PF 1 MG/ML IJ SOLN
0.5000 mg | Freq: Once | INTRAMUSCULAR | Status: AC
  Administered 2012-05-16: 0.5 mg via INTRAVENOUS
  Filled 2012-05-16: qty 1

## 2012-05-16 MED ORDER — PANTOPRAZOLE SODIUM 40 MG IV SOLR
40.0000 mg | Freq: Every day | INTRAVENOUS | Status: DC
  Administered 2012-05-16 – 2012-05-17 (×2): 40 mg via INTRAVENOUS
  Filled 2012-05-16 (×3): qty 40

## 2012-05-16 MED ORDER — MORPHINE SULFATE 2 MG/ML IJ SOLN
1.0000 mg | INTRAMUSCULAR | Status: DC | PRN
  Administered 2012-05-16: 1 mg via INTRAVENOUS
  Filled 2012-05-16: qty 1

## 2012-05-16 MED ORDER — SODIUM CHLORIDE 0.9 % IV BOLUS (SEPSIS)
1000.0000 mL | Freq: Once | INTRAVENOUS | Status: AC
  Administered 2012-05-16: 1000 mL via INTRAVENOUS

## 2012-05-16 MED ORDER — HEPARIN SODIUM (PORCINE) 5000 UNIT/ML IJ SOLN
5000.0000 [IU] | INTRAMUSCULAR | Status: AC
  Administered 2012-05-17: 5000 [IU] via SUBCUTANEOUS
  Filled 2012-05-16: qty 1

## 2012-05-16 MED ORDER — PAROXETINE HCL 20 MG PO TABS
20.0000 mg | ORAL_TABLET | ORAL | Status: DC
  Administered 2012-05-19: 20 mg via ORAL
  Filled 2012-05-16 (×5): qty 1

## 2012-05-16 MED ORDER — IOHEXOL 300 MG/ML  SOLN
50.0000 mL | Freq: Once | INTRAMUSCULAR | Status: AC | PRN
  Administered 2012-05-16: 50 mL via ORAL

## 2012-05-16 MED ORDER — HEPARIN SODIUM (PORCINE) 5000 UNIT/ML IJ SOLN
5000.0000 [IU] | Freq: Three times a day (TID) | INTRAMUSCULAR | Status: DC
  Administered 2012-05-17 – 2012-05-19 (×5): 5000 [IU] via SUBCUTANEOUS
  Filled 2012-05-16 (×8): qty 1

## 2012-05-16 MED ORDER — ONDANSETRON HCL 4 MG/2ML IJ SOLN
4.0000 mg | Freq: Four times a day (QID) | INTRAMUSCULAR | Status: DC | PRN
  Administered 2012-05-16 – 2012-05-18 (×4): 4 mg via INTRAVENOUS
  Filled 2012-05-16 (×4): qty 2

## 2012-05-16 NOTE — ED Notes (Signed)
PA at bedside.

## 2012-05-16 NOTE — H&P (Signed)
Chief Complaint:  Slipped lap band  History of Present Illness:  Samantha Doyle is an 37 y.o. female Who had a laparoscopic adjustable gastric banding, APS in 2009 performed by Dr. Hip in [place]. She has lost over 90 pounds with her band he realizes it is necessary to keep her at her present weight. She does not 1 lose her band but has been struggling with symptoms of an anterior slip. She had been seen by Dr. Andrey Campanile in the office and the fluid was removed from her band. She presents to the ED with difficulty keeping anything down even liquids. A CT scan was done in emergency department that shows what appears to be a chronic slip with no acute changes to the stomach suggesting impending perforation.  I discussed admitting her and rehydrating her prior to taking her to the operating room to adjust, unbuckle, and hopefully replicate her band.  Past Medical History  Diagnosis Date  . LAP-BAND surgery status   . Hypertension   . Bronchitis   . Arthritis   . Cervical dysplasia   . Depression   . Chronic back pain     Past Surgical History  Procedure Laterality Date  . Laparoscopic gastric banding  03/2007    [facility] - [facility]; Dr Jorja Loa Hipp  . Carpal tunnel release    . Varicose vein surgery    . Cervical cone biopsy      No current facility-administered medications for this encounter.   Current Outpatient Prescriptions  Medication Sig Dispense Refill  . albuterol (PROVENTIL HFA;VENTOLIN HFA) 108 (90 BASE) MCG/ACT inhaler Inhale 2 puffs into the lungs every 4 (four) hours as needed for wheezing or shortness of breath (cough).      . calcium carbonate (TUMS - DOSED IN MG ELEMENTAL CALCIUM) 500 MG chewable tablet Chew 2 tablets by mouth 2 (two) times daily as needed. For nausea      . cholecalciferol (VITAMIN D) 1000 UNITS tablet Take 2,000 Units by mouth 2 (two) times daily.      . clonazePAM (KLONOPIN) 1 MG tablet Take 1 mg by mouth 2 (two) times daily as  needed. For anxiety      . cyanocobalamin (,VITAMIN B-12,) 1000 MCG/ML injection Inject 1,000 mcg into the muscle every 14 (fourteen) days.       . Multiple Vitamin (MULITIVITAMIN WITH MINERALS) TABS Take 1 tablet by mouth daily.        . naproxen sodium (ANAPROX) 220 MG tablet Take 220 mg by mouth 2 (two) times daily with a meal.      . promethazine (PHENERGAN) 12.5 MG tablet Take 1-2 tablets (12.5-25 mg total) by mouth every 6 (six) hours as needed for nausea.  20 tablet  3  . HYDROcodone-acetaminophen (NORCO) 10-325 MG per tablet Take 1 tablet by mouth every 6 (six) hours as needed for pain. pain      . PARoxetine (PAXIL) 20 MG tablet Take 20 mg by mouth every morning.      . promethazine (PHENERGAN) 25 MG suppository Place 1 suppository (25 mg total) rectally every 6 (six) hours as needed for nausea.  5 suppository  3   Demerol and Monosodium glutamate History reviewed. No pertinent family history. Social History:   reports that she has been smoking Cigarettes.  She has been smoking about 0.50 packs per day. She has never used smokeless tobacco. She reports that she does not drink alcohol or use illicit drugs.   REVIEW OF SYSTEMS -  PERTINENT POSITIVES ONLY: Has had lower extremity vein strippings and no other abdominal surgery. She had a period about 8 months out from her band when she was having some problems with reflux and vomiting but was eating too fast and not chewing her food well.  Physical Exam:   Blood pressure 111/73, pulse 94, temperature 98.7 F (37.1 C), temperature source Oral, resp. rate 18, height 5\' 3"  (1.6 m), weight 153 lb 1.6 oz (69.446 kg), last menstrual period 05/06/2012, SpO2 98.00%. Body mass index is 27.13 kg/(m^2).  Gen:  WDWN White female NAD  Neurological: Alert and oriented to person, place, and time. Motor and sensory function is grossly intact  Head: Normocephalic and atraumatic.  Eyes: Conjunctivae are normal. Pupils are equal, round, and reactive to  light. No scleral icterus.  Neck: Normal range of motion. Neck supple. No tracheal deviation or thyromegaly present.  Cardiovascular:  SR without murmurs or gallops.  No carotid bruits Respiratory: Effort normal.  No respiratory distress. No chest wall tenderness. Breath sounds normal.  No wheezes, rales or rhonchi.  Abdomen:  Nontender with no rebound or guarding. Port  easily palpated GU: Musculoskeletal: Normal range of motion. Extremities are nontender. No cyanosis, edema or clubbing noted Lymphadenopathy: No cervical, preauricular, postauricular or axillary adenopathy is present Skin: Skin is warm and dry. No rash noted. No diaphoresis. No erythema. No pallor. Pscyh: Normal mood and affect. Behavior is normal. Judgment and thought content normal.   LABORATORY RESULTS: Results for orders placed during the hospital encounter of 05/16/12 (from the past 48 hour(s))  CBC WITH DIFFERENTIAL     Status: None   Collection Time    05/16/12  1:40 PM      Result Value Range   WBC 6.3  4.0 - 10.5 K/uL   RBC 4.77  3.87 - 5.11 MIL/uL   Hemoglobin 13.4  12.0 - 15.0 g/dL   HCT 57.8  46.9 - 62.9 %   MCV 84.3  78.0 - 100.0 fL   MCH 28.1  26.0 - 34.0 pg   MCHC 33.3  30.0 - 36.0 g/dL   RDW 52.8  41.3 - 24.4 %   Platelets 233  150 - 400 K/uL   Neutrophils Relative 63  43 - 77 %   Neutro Abs 3.9  1.7 - 7.7 K/uL   Lymphocytes Relative 24  12 - 46 %   Lymphs Abs 1.5  0.7 - 4.0 K/uL   Monocytes Relative 11  3 - 12 %   Monocytes Absolute 0.7  0.1 - 1.0 K/uL   Eosinophils Relative 2  0 - 5 %   Eosinophils Absolute 0.1  0.0 - 0.7 K/uL   Basophils Relative 0  0 - 1 %   Basophils Absolute 0.0  0.0 - 0.1 K/uL  COMPREHENSIVE METABOLIC PANEL     Status: Abnormal   Collection Time    05/16/12  1:40 PM      Result Value Range   Sodium 141  135 - 145 mEq/L   Potassium 3.1 (*) 3.5 - 5.1 mEq/L   Chloride 101  96 - 112 mEq/L   CO2 29  19 - 32 mEq/L   Glucose, Bld 82  70 - 99 mg/dL   BUN 16  6 - 23 mg/dL    Creatinine, Ser 0.10  0.50 - 1.10 mg/dL   Calcium 9.6  8.4 - 27.2 mg/dL   Total Protein 7.2  6.0 - 8.3 g/dL   Albumin 3.7  3.5 - 5.2  g/dL   AST 14  0 - 37 U/L   ALT 11  0 - 35 U/L   Alkaline Phosphatase 50  39 - 117 U/L   Total Bilirubin 0.9  0.3 - 1.2 mg/dL   GFR calc non Af Amer >90  >90 mL/min   GFR calc Af Amer >90  >90 mL/min   Comment:            The eGFR has been calculated     using the CKD EPI equation.     This calculation has not been     validated in all clinical     situations.     eGFR's persistently     <90 mL/min signify     possible Chronic Kidney Disease.  LIPASE, BLOOD     Status: None   Collection Time    05/16/12  1:40 PM      Result Value Range   Lipase 33  11 - 59 U/L    RADIOLOGY RESULTS: Ct Abdomen Pelvis W Contrast  05/16/2012  *RADIOLOGY REPORT*  Clinical Data: History of prior laparoscopic gastric band placement in 2009 at an outside institution.  The patient complains of nausea, poor intake and reflux symptoms.  CT ABDOMEN AND PELVIS WITH CONTRAST  Technique:  Multidetector CT imaging of the abdomen and pelvis was performed following the standard protocol during bolus administration of intravenous contrast.  Contrast: 50mL OMNIPAQUE IOHEXOL 300 MG/ML  SOLN, OMNIPAQUE IOHEXOL 300 MG/ML  SOLN  Comparison: None.  Findings: There is evidence of probable slippage of a gastric band which is located relatively low at the level of the stomach with a fairly large pouch present above the band.  The band is oriented nearly transversely and a 9 o'clock to 3 o'clock position.  There is no obvious evidence of gastric necrosis or perforation.  No abnormal fluid collection is seen.  Ingested oral contrast is seen primarily at the level of the gastric pouch above the gastric band.  However, diluted contrast is seen to enter the distal aspect of the stomach and the stomach is clearly not obstructed.  Other bowel loops are normal in caliber and show no evidence of  obstruction.  Tubing attached to the band and extending to the level of a subcutaneous port appears intact.  The liver is unremarkable and shows focal hypodensity along the inferior margin of the falciform ligament within the lower left lobe likely representing focal fat.  The gallbladder, spleen, pancreas, adrenal glands and kidneys are unremarkable.  Within the pelvis, a small amount of free fluid is present at the level of the cul-de-sac.  The bladder, uterus and adnexal regions are unremarkable by CT.  No hernias are identified.  No incidental masses or enlarged lymph nodes.  Degenerative changes are present of the spine with associated anterolisthesis of L4 on L5.  IMPRESSION: There is evidence of slippage of the gastric band by imaging with a low-lying band present oriented in a transverse position.  There is no evidence of gastric obstruction or perforation.   Original Report Authenticated By: Irish Lack, M.D.    Dg Abd Acute W/chest  05/16/2012  *RADIOLOGY REPORT*  Clinical Data: Unable to eat, lap band fluid removed several days ago  ACUTE ABDOMEN SERIES (ABDOMEN 2 VIEW & CHEST 1 VIEW)  Comparison: CT same date  Findings: Please see separate exam report dictated today.  Cardiomediastinal silhouette is within normal limits. The lungs are clear. No pleural effusion.  No pneumothorax.  No  acute osseous abnormality.  Abnormal orientation to the gastric band, currently in the approximate 9 o'clock to 3 o'clock orientation.  No free air beneath the diaphragms.  Retained contrast within nondilated renal collecting system.  Normal bowel gas pattern.  No abnormal radiopacity.  No acute osseous finding.  IMPRESSION: Abnormal lap band orientation; please see dedicated CT report dictated today for further evaluation.  No acute cardiopulmonary process.   Original Report Authenticated By: Christiana Pellant, M.D.     Problem List: Patient Active Problem List   Diagnosis Date Noted  . Lapband APS 2009 [facility]  05/16/2012  . Encounter for fitting or adjustment of gastric lap band 05/13/2012    Assessment & Plan: Anterior lap band slip plan admit for hydration and for laparoscopy tomorrow reduction of slip and replication of band.      Matt B. Daphine Deutscher, MD, [facility] [phone] beeper [phone]  05/16/2012 2:49 PM

## 2012-05-16 NOTE — ED Notes (Signed)
Pt states she had lap band surgery in 2009. Pt c/o problems with lap band. States that when she drinks the fluid comes back up. Pt states she is not nauseated. States "its a lap band problem." reports that she had fluid removed from it on Thursday and since then has lost 5lbs due to not being able to eat.

## 2012-05-16 NOTE — ED Provider Notes (Signed)
History     CSN: [id number]  Arrival date & time 05/16/12  1238   First MD Initiated Contact with Patient 05/16/12 1313      Chief Complaint  Patient presents with  . GI Problem    (Consider location/radiation/quality/duration/timing/severity/associated sxs/prior treatment) HPI  Samantha Doyle is a 37 y.o. female with history of gastric lap band surgery presents to ED c/o of "lap band problems".  Patient states she has been experiencing GERD-like symptoms including heart burn, acid indigestion, and occasional vomiting for the last month.  Symptoms have been getting progressively worse including intolerance to both solids and liquids.  Patient saw surgeon Dr.Eric Andrey Campanile on 05/13/12 and subsequently had 4 cc of saline removed to an expected fill volume of 0 cc.  After initial improvement, patient reports 4-5 episodes per day of non-bilious, non-coffee ground emesis as well as 5 pound weight loss.  Patient also reports 3 instances of diarrhea immediately following deflation of lap band.  Patient states she remains unable to digest solids or liquids and feels as if everything she tries to digest "sits on my chest".  Patient denies fever, dysphagia, shortness of breath, chest pain, abdominal pain, nausea, melena, hematochezia, dysuria, or hematuria.  Past Medical History  Diagnosis Date  . LAP-BAND surgery status   . Hypertension   . Bronchitis   . Arthritis   . Cervical dysplasia   . Depression   . Chronic back pain     Past Surgical History  Procedure Laterality Date  . Laparoscopic gastric banding  03/2007    APS - [facility]; Dr Jorja Loa Hipp  . Carpal tunnel release    . Varicose vein surgery    . Cervical cone biopsy      History reviewed. No pertinent family history.  History  Substance Use Topics  . Smoking status: Current Every Day Smoker -- 0.50 packs/day    Types: Cigarettes  . Smokeless tobacco: Never Used  . Alcohol Use: No    OB History    Grav Para Term Preterm Abortions TAB SAB Ect Mult Living                  Review of Systems  Constitutional: Negative for fever.  Respiratory: Negative for shortness of breath.   Cardiovascular: Negative for chest pain.  Gastrointestinal: Positive for nausea and vomiting. Negative for abdominal pain and diarrhea.  All other systems reviewed and are negative.    Allergies  Demerol and Monosodium glutamate  Home Medications   Current Outpatient Rx  Name  Route  Sig  Dispense  Refill  . albuterol (PROVENTIL HFA;VENTOLIN HFA) 108 (90 BASE) MCG/ACT inhaler   Inhalation   Inhale 2 puffs into the lungs every 4 (four) hours as needed for wheezing or shortness of breath (cough).         . calcium carbonate (TUMS - DOSED IN MG ELEMENTAL CALCIUM) 500 MG chewable tablet   Oral   Chew 2 tablets by mouth 2 (two) times daily as needed. For nausea         . cholecalciferol (VITAMIN D) 1000 UNITS tablet   Oral   Take 2,000 Units by mouth 2 (two) times daily.         . clonazePAM (KLONOPIN) 1 MG tablet   Oral   Take 1 mg by mouth 2 (two) times daily as needed. For anxiety         . cyanocobalamin (,VITAMIN B-12,) 1000 MCG/ML injection   Intramuscular  Inject 1,000 mcg into the muscle every 14 (fourteen) days.          Marland Kitchen HYDROcodone-acetaminophen (NORCO) 10-325 MG per tablet   Oral   Take 1 tablet by mouth every 6 (six) hours as needed for pain. pain         . Multiple Vitamin (MULITIVITAMIN WITH MINERALS) TABS   Oral   Take 1 tablet by mouth daily.           . naproxen sodium (ANAPROX) 220 MG tablet   Oral   Take 220 mg by mouth 2 (two) times daily with a meal.         . PARoxetine (PAXIL) 20 MG tablet   Oral   Take 20 mg by mouth every morning.         . promethazine (PHENERGAN) 12.5 MG tablet   Oral   Take 1-2 tablets (12.5-25 mg total) by mouth every 6 (six) hours as needed for nausea.   20 tablet   3   . promethazine (PHENERGAN) 25 MG suppository    Rectal   Place 1 suppository (25 mg total) rectally every 6 (six) hours as needed for nausea.   5 suppository   3     BP 111/73  Pulse 94  Temp(Src) 98.7 F (37.1 C) (Oral)  Resp 18  Ht 5\' 3"  (1.6 m)  Wt 153 lb 1.6 oz (69.446 kg)  BMI 27.13 kg/m2  SpO2 98%  LMP 05/06/2012  Physical Exam  Nursing note and vitals reviewed. Constitutional: She is oriented to person, place, and time. She appears well-developed and well-nourished. No distress.  HENT:  Head: Normocephalic.  Dry MM  Eyes: Conjunctivae and EOM are normal. Pupils are equal, round, and reactive to light.  Neck: Normal range of motion.  Cardiovascular: Normal rate, regular rhythm and intact distal pulses.   Pulmonary/Chest: Effort normal and breath sounds normal. No stridor. No respiratory distress. She has no wheezes. She has no rales. She exhibits no tenderness.  No TTP  Abdominal: Soft. Bowel sounds are normal. She exhibits no distension and no mass. There is no tenderness. There is no rebound and no guarding.  Musculoskeletal: Normal range of motion.  Neurological: She is alert and oriented to person, place, and time.  Psychiatric: She has a normal mood and affect.    ED Course  Procedures (including critical care time)  Labs Reviewed  COMPREHENSIVE METABOLIC PANEL - Abnormal; Notable for the following:    Potassium 3.1 (*)    All other components within normal limits  CBC WITH DIFFERENTIAL  LIPASE, BLOOD   Ct Abdomen Pelvis W Contrast  05/16/2012  *RADIOLOGY REPORT*  Clinical Data: History of prior laparoscopic gastric band placement in 2009 at an outside institution.  The patient complains of nausea, poor intake and reflux symptoms.  CT ABDOMEN AND PELVIS WITH CONTRAST  Technique:  Multidetector CT imaging of the abdomen and pelvis was performed following the standard protocol during bolus administration of intravenous contrast.  Contrast: 50mL OMNIPAQUE IOHEXOL 300 MG/ML  SOLN, OMNIPAQUE IOHEXOL 300  MG/ML  SOLN  Comparison: None.  Findings: There is evidence of probable slippage of a gastric band which is located relatively low at the level of the stomach with a fairly large pouch present above the band.  The band is oriented nearly transversely and a 9 o'clock to 3 o'clock position.  There is no obvious evidence of gastric necrosis or perforation.  No abnormal fluid collection is seen.  Ingested oral  contrast is seen primarily at the level of the gastric pouch above the gastric band.  However, diluted contrast is seen to enter the distal aspect of the stomach and the stomach is clearly not obstructed.  Other bowel loops are normal in caliber and show no evidence of obstruction.  Tubing attached to the band and extending to the level of a subcutaneous port appears intact.  The liver is unremarkable and shows focal hypodensity along the inferior margin of the falciform ligament within the lower left lobe likely representing focal fat.  The gallbladder, spleen, pancreas, adrenal glands and kidneys are unremarkable.  Within the pelvis, a small amount of free fluid is present at the level of the cul-de-sac.  The bladder, uterus and adnexal regions are unremarkable by CT.  No hernias are identified.  No incidental masses or enlarged lymph nodes.  Degenerative changes are present of the spine with associated anterolisthesis of L4 on L5.  IMPRESSION: There is evidence of slippage of the gastric band by imaging with a low-lying band present oriented in a transverse position.  There is no evidence of gastric obstruction or perforation.   Original Report Authenticated By: Irish Lack, M.D.    Dg Abd Acute W/chest  05/16/2012  *RADIOLOGY REPORT*  Clinical Data: Unable to eat, lap band fluid removed several days ago  ACUTE ABDOMEN SERIES (ABDOMEN 2 VIEW & CHEST 1 VIEW)  Comparison: CT same date  Findings: Please see separate exam report dictated today.  Cardiomediastinal silhouette is within normal limits. The lungs  are clear. No pleural effusion.  No pneumothorax.  No acute osseous abnormality.  Abnormal orientation to the gastric band, currently in the approximate 9 o'clock to 3 o'clock orientation.  No free air beneath the diaphragms.  Retained contrast within nondilated renal collecting system.  Normal bowel gas pattern.  No abnormal radiopacity.  No acute osseous finding.  IMPRESSION: Abnormal lap band orientation; please see dedicated CT report dictated today for further evaluation.  No acute cardiopulmonary process.   Original Report Authenticated By: Christiana Pellant, M.D.      1. Lapband APS 2009 [place]   2. Nausea and vomiting in adult   3. 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 She denies abdominal pain, abdominal exam is benign and nonsurgical. Afebrile at this time.  GI consult from Dr. Daphine Deutscher appreciated: He will come to evaluate the patient.  CT shows slippage of the lap band. Patient will be admitted to the GI service.  Medications  potassium chloride 10 mEq in 100 mL IVPB (not administered)  HYDROmorphone (DILAUDID) injection 0.5 mg (0.5 mg Intravenous Given 05/16/12 1341)  ondansetron (ZOFRAN) injection 4 mg (4 mg Intravenous Given 05/16/12 1342)  sodium chloride 0.9 % bolus 1,000 mL (1,000 mLs Intravenous New Bag/Given 05/16/12 1341)  pantoprazole (PROTONIX) injection 40 mg (40 mg Intravenous Given 05/16/12 1429)  iohexol (OMNIPAQUE) 300 MG/ML solution 50 mL (50 mLs Oral Contrast Given 05/16/12 1336)  iohexol (OMNIPAQUE) 300 MG/ML solution 100 mL (100 mLs Intravenous Contrast Given 05/16/12 1349)  Wynetta Emery, PA-C 05/16/12 1557

## 2012-05-16 NOTE — Progress Notes (Signed)
hibicleanese shower for am procedure

## 2012-05-16 NOTE — ED Notes (Signed)
Patient transported to CT 

## 2012-05-16 NOTE — ED Notes (Signed)
Patient transported to X-ray 

## 2012-05-17 ENCOUNTER — Encounter (HOSPITAL_COMMUNITY): Payer: Self-pay | Admitting: Anesthesiology

## 2012-05-17 ENCOUNTER — Encounter (HOSPITAL_COMMUNITY)
Admission: EM | Disposition: A | Discharge status: Home / Self Care | Payer: Self-pay | Source: Home / Self Care | Attending: Emergency Medicine

## 2012-05-17 ENCOUNTER — Encounter (HOSPITAL_COMMUNITY): Payer: Self-pay

## 2012-05-17 ENCOUNTER — Observation Stay (HOSPITAL_COMMUNITY): Payer: Managed Care, Other (non HMO) | Admitting: Anesthesiology

## 2012-05-17 DIAGNOSIS — T85898A Other specified complication of other internal prosthetic devices, implants and grafts, initial encounter: Secondary | ICD-10-CM

## 2012-05-17 HISTORY — PX: LAPAROSCOPIC REVISION OF GASTRIC BAND: SHX5921

## 2012-05-17 LAB — COMPREHENSIVE METABOLIC PANEL
ALT: 8 U/L (ref 0–35)
AST: 10 U/L (ref 0–37)
CO2: 29 mEq/L (ref 19–32)
Chloride: 104 mEq/L (ref 96–112)
GFR calc non Af Amer: >90 mL/min (ref >90)
Potassium: 3.2 mEq/L — ABNORMAL LOW (ref 3.5–5.1)
Sodium: 139 mEq/L (ref 135–145)
Total Bilirubin: 0.7 mg/dL (ref 0.3–1.2)

## 2012-05-17 LAB — CBC WITH DIFFERENTIAL/PLATELET
Basophils Absolute: 0 10*3/uL (ref 0.0–0.1)
HCT: 33.9 % — ABNORMAL LOW (ref 36.0–46.0)
Lymphocytes Relative: 50 % — ABNORMAL HIGH (ref 12–46)
Neutro Abs: 2 10*3/uL (ref 1.7–7.7)
Platelets: 174 10*3/uL (ref 150–400)
RDW: 13.9 % (ref 11.5–15.5)
WBC: 5.4 10*3/uL (ref 4.0–10.5)

## 2012-05-17 SURGERY — REVISION, GASTRIC BAND, LAPAROSCOPIC
Anesthesia: General | Site: Abdomen | Wound class: Clean

## 2012-05-17 MED ORDER — NEOSTIGMINE METHYLSULFATE 1 MG/ML IJ SOLN
INTRAMUSCULAR | Status: DC | PRN
  Administered 2012-05-17: 4 mg via INTRAVENOUS

## 2012-05-17 MED ORDER — HYDROMORPHONE HCL PF 1 MG/ML IJ SOLN
INTRAMUSCULAR | Status: AC
  Administered 2012-05-17: 1 mg
  Filled 2012-05-17: qty 1

## 2012-05-17 MED ORDER — FENTANYL CITRATE 0.05 MG/ML IJ SOLN
INTRAMUSCULAR | Status: DC | PRN
  Administered 2012-05-17 (×5): 50 ug via INTRAVENOUS

## 2012-05-17 MED ORDER — ACETAMINOPHEN 10 MG/ML IV SOLN
INTRAVENOUS | Status: DC | PRN
  Administered 2012-05-17: 1000 mg via INTRAVENOUS

## 2012-05-17 MED ORDER — FENTANYL CITRATE 0.05 MG/ML IJ SOLN
25.0000 ug | INTRAMUSCULAR | Status: DC | PRN
  Administered 2012-05-17 (×2): 50 ug via INTRAVENOUS

## 2012-05-17 MED ORDER — BUPIVACAINE-EPINEPHRINE 0.5% -1:200000 IJ SOLN
INTRAMUSCULAR | Status: DC | PRN
  Administered 2012-05-17: 24 mL

## 2012-05-17 MED ORDER — SUCCINYLCHOLINE CHLORIDE 20 MG/ML IJ SOLN
INTRAMUSCULAR | Status: DC | PRN
  Administered 2012-05-17: 100 mg via INTRAVENOUS

## 2012-05-17 MED ORDER — HYDROMORPHONE HCL PF 1 MG/ML IJ SOLN
0.5000 mg | INTRAMUSCULAR | Status: DC | PRN
  Administered 2012-05-17 – 2012-05-19 (×13): 1 mg via INTRAVENOUS
  Filled 2012-05-17 (×13): qty 1

## 2012-05-17 MED ORDER — LACTATED RINGERS IV SOLN: INTRAVENOUS | Status: DC

## 2012-05-17 MED ORDER — LIDOCAINE HCL (CARDIAC) 20 MG/ML IV SOLN
INTRAVENOUS | Status: DC | PRN
  Administered 2012-05-17: 100 mg via INTRAVENOUS

## 2012-05-17 MED ORDER — PROPOFOL 10 MG/ML IV BOLUS
INTRAVENOUS | Status: DC | PRN
  Administered 2012-05-17: 150 mg via INTRAVENOUS

## 2012-05-17 MED ORDER — 0.9 % SODIUM CHLORIDE (POUR BTL) OPTIME
TOPICAL | Status: DC | PRN
  Administered 2012-05-17: 1000 mL

## 2012-05-17 MED ORDER — ONDANSETRON HCL 4 MG/2ML IJ SOLN
INTRAMUSCULAR | Status: DC | PRN
  Administered 2012-05-17: 4 mg via INTRAVENOUS

## 2012-05-17 MED ORDER — ROCURONIUM BROMIDE 100 MG/10ML IV SOLN
INTRAVENOUS | Status: DC | PRN
  Administered 2012-05-17 (×3): 10 mg via INTRAVENOUS
  Administered 2012-05-17: 30 mg via INTRAVENOUS
  Administered 2012-05-17: 5 mg via INTRAVENOUS

## 2012-05-17 MED ORDER — GLYCOPYRROLATE 0.2 MG/ML IJ SOLN
INTRAMUSCULAR | Status: DC | PRN
  Administered 2012-05-17: 0.2 mg via INTRAVENOUS
  Administered 2012-05-17: 0.6 mg via INTRAVENOUS
  Administered 2012-05-17: 0.2 mg via INTRAVENOUS

## 2012-05-17 MED ORDER — PROMETHAZINE HCL 25 MG/ML IJ SOLN
6.2500 mg | INTRAMUSCULAR | Status: DC | PRN
  Administered 2012-05-17: 6.25 mg via INTRAVENOUS

## 2012-05-17 MED ORDER — LACTATED RINGERS IV SOLN
INTRAVENOUS | Status: DC | PRN
  Administered 2012-05-17 (×3): via INTRAVENOUS

## 2012-05-17 MED ORDER — MIDAZOLAM HCL 5 MG/5ML IJ SOLN
INTRAMUSCULAR | Status: DC | PRN
  Administered 2012-05-17: 2 mg via INTRAVENOUS

## 2012-05-17 MED ORDER — LACTATED RINGERS IR SOLN
Status: DC | PRN
  Administered 2012-05-17: 1000 mL

## 2012-05-17 SURGICAL SUPPLY — 53 items
BLADE HEX COATED 2.75 (ELECTRODE) ×2 IMPLANT
BLADE SURG 15 STRL LF DISP TIS (BLADE) IMPLANT
BLADE SURG 15 STRL SS (BLADE)
BLADE SURG SZ11 CARB STEEL (BLADE) ×2 IMPLANT
CABLE HIGH FREQUENCY MONO STRZ (ELECTRODE) ×2 IMPLANT
CANISTER SUCTION 2500CC (MISCELLANEOUS) ×2 IMPLANT
CLOTH BEACON ORANGE TIMEOUT ST (SAFETY) ×2 IMPLANT
DECANTER SPIKE VIAL GLASS SM (MISCELLANEOUS) ×4 IMPLANT
DERMABOND ADVANCED (GAUZE/BANDAGES/DRESSINGS) ×1
DERMABOND ADVANCED .7 DNX12 (GAUZE/BANDAGES/DRESSINGS) ×1 IMPLANT
DEVICE SUT QUICK LOAD TK 5 (STAPLE) ×12 IMPLANT
DEVICE SUT TI-KNOT TK 5X26 (MISCELLANEOUS) ×2 IMPLANT
DEVICE SUTURE ENDOST 10MM (ENDOMECHANICALS) IMPLANT
DRAPE CAMERA CLOSED 9X96 (DRAPES) ×2 IMPLANT
ELECT REM PT RETURN 9FT ADLT (ELECTROSURGICAL) ×2
ELECTRODE REM PT RTRN 9FT ADLT (ELECTROSURGICAL) ×1 IMPLANT
GLOVE BIOGEL PI IND STRL 7.0 (GLOVE) ×1 IMPLANT
GLOVE BIOGEL PI INDICATOR 7.0 (GLOVE) ×1
GLOVE SS BIOGEL STRL SZ 7.5 (GLOVE) ×1 IMPLANT
GLOVE SUPERSENSE BIOGEL SZ 7.5 (GLOVE) ×1
GOWN STRL NON-REIN LRG LVL3 (GOWN DISPOSABLE) ×2 IMPLANT
GOWN STRL REIN XL XLG (GOWN DISPOSABLE) ×4 IMPLANT
HOVERMATT SINGLE USE (MISCELLANEOUS) ×2 IMPLANT
KIT BASIN OR (CUSTOM PROCEDURE TRAY) ×2 IMPLANT
NEEDLE SPNL 22GX3.5 QUINCKE BK (NEEDLE) ×2 IMPLANT
NS IRRIG 1000ML POUR BTL (IV SOLUTION) ×2 IMPLANT
PACK UNIVERSAL I (CUSTOM PROCEDURE TRAY) ×2 IMPLANT
PENCIL BUTTON HOLSTER BLD 10FT (ELECTRODE) ×2 IMPLANT
SCALPEL HARMONIC ACE (MISCELLANEOUS) ×2 IMPLANT
SET IRRIG TUBING LAPAROSCOPIC (IRRIGATION / IRRIGATOR) ×2 IMPLANT
SLEEVE ADV FIXATION 5X100MM (TROCAR) ×2 IMPLANT
SLEEVE XCEL OPT CAN 5 100 (ENDOMECHANICALS) ×6 IMPLANT
SOLUTION ANTI FOG 6CC (MISCELLANEOUS) ×2 IMPLANT
SPONGE LAP 18X18 X RAY DECT (DISPOSABLE) ×2 IMPLANT
STAPLER VISISTAT 35W (STAPLE) ×2 IMPLANT
SUT ETHIBOND 2 0 SH (SUTURE) ×8
SUT ETHIBOND 2 0 SH 36X2 (SUTURE) ×8 IMPLANT
SUT MNCRL AB 4-0 PS2 18 (SUTURE) ×2 IMPLANT
SUT PROLENE 2 0 CT2 30 (SUTURE) ×2 IMPLANT
SUT SILK 0 (SUTURE) ×1
SUT SILK 0 30XBRD TIE 6 (SUTURE) ×1 IMPLANT
SUT SURGIDAC NAB ES-9 0 48 120 (SUTURE) IMPLANT
SUT VIC AB 2-0 SH 27 (SUTURE) ×1
SUT VIC AB 2-0 SH 27X BRD (SUTURE) ×1 IMPLANT
SYR 20CC LL (SYRINGE) ×2 IMPLANT
SYR CONTROL 10ML LL (SYRINGE) ×2 IMPLANT
TOWEL OR 17X26 10 PK STRL BLUE (TOWEL DISPOSABLE) ×2 IMPLANT
TROCAR ADV FIXATION 11X100MM (TROCAR) IMPLANT
TROCAR BLADELESS OPT 5 100 (ENDOMECHANICALS) ×2 IMPLANT
TROCAR XCEL NON-BLD 11X100MML (ENDOMECHANICALS) ×2 IMPLANT
TROCAR Z-THREAD FIOS 5X100MM (TROCAR) ×2 IMPLANT
TUBE CALIBRATION LAPBAND (TUBING) ×2 IMPLANT
TUBING INSUFFLATION 10FT LAP (TUBING) ×2 IMPLANT

## 2012-05-17 NOTE — Transfer of Care (Signed)
Immediate Anesthesia Transfer of Care Note  Patient: Samantha Doyle  Procedure(s) Performed: Procedure(s) (LRB): LAPAROSCOPIC REVISION OF SLIPPED GASTRIC BAND (N/A)  Patient Location: PACU  Anesthesia Type: General  Level of Consciousness: sedated, patient cooperative and responds to stimulaton  Airway & Oxygen Therapy: Patient Spontanous Breathing and Patient connected to face mask oxgen  Post-op Assessment: Report given to PACU RN and Post -op Vital signs reviewed and stable  Post vital signs: Reviewed and stable  Complications: No apparent anesthesia complications

## 2012-05-17 NOTE — Anesthesia Procedure Notes (Addendum)
Procedure Name: Intubation Date/Time: 05/17/2012 12:35 PM Performed by: Doran Clay Pre-anesthesia Checklist: Patient identified, Emergency Drugs available, Suction available, Patient being monitored and Timeout performed Patient Re-evaluated:Patient Re-evaluated prior to inductionOxygen Delivery Method: Circle system utilized Preoxygenation: Pre-oxygenation with 100% oxygen Intubation Type: IV induction Ventilation: Mask ventilation without difficulty Laryngoscope Size: Mac and 3 Grade View: Grade I Tube type: Oral Tube size: 7.5 mm Number of attempts: 1 Airway Equipment and Method: Stylet Placement Confirmation: ETT inserted through vocal cords under direct vision,  positive ETCO2 and breath sounds checked- equal and bilateral Secured at: 22 cm Tube secured with: Tape Dental Injury: Teeth and Oropharynx as per pre-operative assessment  Comments: Intubated by Glenna Fellows

## 2012-05-17 NOTE — Anesthesia Preprocedure Evaluation (Addendum)
Anesthesia Evaluation  Patient identified by MRN, date of birth, ID band Patient awake    Reviewed: Allergy & Precautions, H&P , NPO status , Patient's Chart, lab work & pertinent test results  Airway Mallampati: II TM Distance: >3 FB Neck ROM: Full    Dental no notable dental hx. (+) Chipped and Missing   Pulmonary neg pulmonary ROS, Current Smoker,  breath sounds clear to auscultation  Pulmonary exam normal       Cardiovascular hypertension, Rhythm:Regular Rate:Normal     Neuro/Psych negative neurological ROS  negative psych ROS   GI/Hepatic negative GI ROS, Neg liver ROS,   Endo/Other  negative endocrine ROS  Renal/GU negative Renal ROS  negative genitourinary   Musculoskeletal negative musculoskeletal ROS (+)   Abdominal   Peds negative pediatric ROS (+)  Hematology negative hematology ROS (+)   Anesthesia Other Findings   Reproductive/Obstetrics negative OB ROS                          Anesthesia Physical Anesthesia Plan  ASA: II  Anesthesia Plan: General   Post-op Pain Management:    Induction: Intravenous  Airway Management Planned: Oral ETT  Additional Equipment:   Intra-op Plan:   Post-operative Plan: Extubation in OR  Informed Consent: I have reviewed the patients History and Physical, chart, labs and discussed the procedure including the risks, benefits and alternatives for the proposed anesthesia with the patient or authorized representative who has indicated his/her understanding and acceptance.   Dental advisory given  Plan Discussed with: CRNA  Anesthesia Plan Comments:         Anesthesia Quick Evaluation

## 2012-05-17 NOTE — Op Note (Signed)
Preoperative Diagnosis: slipped lap band  Postoprative Diagnosis: same  Procedure: Procedure(s): LAPAROSCOPIC REVISION OF SLIPPED GASTRIC BAND   Surgeon: Glenna Fellows T   Assistants: none  Anesthesia:  General endotracheal anesthesiaDiagnos  Indications:   Patient is a 37 year old female with a history of lap band placement in [place] approximately 5 years ago. She has had excellent weight loss of well over 100 pounds but presents with a week of persistent and worsening nausea, vomiting and reflux. CT scan has shown a large anterior slip of her band. After discussion of options we have elected to proceed with laparoscopic revision. We discussed the indications in nature the surgery as well as risks of anesthetic complications, bleeding, infection, gastric or other visceral injury and possible need to remove the band. She understands and agrees.  Procedure Detail:  Patient was brought to the operating room, placed supine on operating table, and general tracheal anesthesia induced. She received preoperative IV antibiotics. Subcutaneous heparin was administered. PAS port in place. The abdomen was widely sterilely prepped and draped. Patient timeout was performed and the procedure verified. Access was obtained without difficulty in the left upper quadrant with a 5 mm Optiview trocar and pneumoperitoneum established. Under direct vision a 5 mm trocar was placed laterally in the right upper quadrant an 11 mm trocar in the right midabdomen. Through the subxiphoid site a 5 mm Nathanson retractor was placed in the left lobe of the liver was elevated with good exposure of the band in the upper stomach with really no significant adhesions to the liver. Finally a 5 mm trocar was placed just above and to the left of the umbilicus and the camera port. Some filmy adhesions around the band  Were taken down. There was seen to be a very large anterior slip. The adhesions were cleared away from the buckle of  the band and then the imbrication could be seen extending up toward the left upper quadrant. Staying right on the band the imbrication was completely taken down with a somewhat tedious dissection until the stomach was completely mobilized and the band was freed all the way up to the angle of Hiss. At this point I am buckle the band for further mobilization. The stomach could then be brought down nicely and the band was repositioned high on the stomach with a normal tiny gastric pouch. The band was seen to be well positioned posteriorly. A sizing tube was placed into the stomach and air fluid was suctioned. The band was buckled over the sizing tube without difficulty. The sizing tube was removed. The fundus was then imbricated back over the band to the small gastric pouch with several interrupted 2-0 Ethibond sutures. The abdomen was irrigated and complete hemostasis assured. There was no evidence of any injury or bleeding. The Nathanson retractor was removed under direct vision. All CO2 was evacuated trochars removed. Skin incisions were closed with subcuticular Monocryl and Dermabond.  Findings: Large anterior slip  Estimated Blood Loss:  Minimal         Drains: none  Blood Given: none          Specimens: none        Complications:  * No complications entered in OR log *         Disposition: PACU - hemodynamically stable.         Condition: stable

## 2012-05-17 NOTE — Progress Notes (Signed)
I was going to call patient to check on her- but it looks like patient is admitted at the hospital and undergoing surgery by Dr Johna Sheriff. Will check on patient once she is discharged.

## 2012-05-17 NOTE — Progress Notes (Signed)
Patient ID: Samantha Doyle, female   DOB: Aug 29, 1975, 37 y.o.   MRN: [id number]    Subjective: OK as long as she is NPO, just a little nausea.  Denies pain  Objective: Vital signs in last 24 hours: Temp:  [98.1 F (36.7 C)-98.7 F (37.1 C)] 98.1 F (36.7 C) (05/12 0635) Pulse Rate:  [50-94] 50 (05/12 0635) Resp:  [16-18] 16 (05/12 0635) BP: (109-127)/(73-82) 121/80 mmHg (05/12 0635) SpO2:  [98 %-100 %] 100 % (05/12 0635) Weight:  [153 lb 1.6 oz (69.446 kg)] 153 lb 1.6 oz (69.446 kg) (05/11 1253) Last BM Date: 05/13/12  Intake/Output from previous day: 05/11 0701 - 05/12 0700 In: 1264.6 [I.V.:1264.6] Out: 0  Intake/Output this shift:    General appearance: alert, cooperative and no distress GI: normal findings: soft, non-tender  Lab Results:   Recent Labs  05/16/12 2010 05/17/12 0413  WBC 5.1 5.4  HGB 11.8* 10.7*  HCT 35.8* 33.9*  PLT 190 174   BMET  Recent Labs  05/16/12 2010 05/17/12 0413  NA 140 139  K 3.2* 3.2*  CL 103 104  CO2 30 29  GLUCOSE 78 87  BUN 15 15  CREATININE 0.68 0.81  CALCIUM 8.6 8.5     Studies/Results: Ct Abdomen Pelvis W Contrast  05/16/2012  *RADIOLOGY REPORT*  Clinical Data: History of prior laparoscopic gastric band placement in 2009 at an outside institution.  The patient complains of nausea, poor intake and reflux symptoms.  CT ABDOMEN AND PELVIS WITH CONTRAST  Technique:  Multidetector CT imaging of the abdomen and pelvis was performed following the standard protocol during bolus administration of intravenous contrast.  Contrast: 50mL OMNIPAQUE IOHEXOL 300 MG/ML  SOLN, OMNIPAQUE IOHEXOL 300 MG/ML  SOLN  Comparison: None.  Findings: There is evidence of probable slippage of a gastric band which is located relatively low at the level of the stomach with a fairly large pouch present above the band.  The band is oriented nearly transversely and a 9 o'clock to 3 o'clock position.  There is no obvious evidence of gastric necrosis or  perforation.  No abnormal fluid collection is seen.  Ingested oral contrast is seen primarily at the level of the gastric pouch above the gastric band.  However, diluted contrast is seen to enter the distal aspect of the stomach and the stomach is clearly not obstructed.  Other bowel loops are normal in caliber and show no evidence of obstruction.  Tubing attached to the band and extending to the level of a subcutaneous port appears intact.  The liver is unremarkable and shows focal hypodensity along the inferior margin of the falciform ligament within the lower left lobe likely representing focal fat.  The gallbladder, spleen, pancreas, adrenal glands and kidneys are unremarkable.  Within the pelvis, a small amount of free fluid is present at the level of the cul-de-sac.  The bladder, uterus and adnexal regions are unremarkable by CT.  No hernias are identified.  No incidental masses or enlarged lymph nodes.  Degenerative changes are present of the spine with associated anterolisthesis of L4 on L5.  IMPRESSION: There is evidence of slippage of the gastric band by imaging with a low-lying band present oriented in a transverse position.  There is no evidence of gastric obstruction or perforation.   Original Report Authenticated By: Irish Lack, M.D.    Dg Abd Acute W/chest  05/16/2012  *RADIOLOGY REPORT*  Clinical Data: Unable to eat, lap band fluid removed several days ago  ACUTE ABDOMEN  SERIES (ABDOMEN 2 VIEW & CHEST 1 VIEW)  Comparison: CT same date  Findings: Please see separate exam report dictated today.  Cardiomediastinal silhouette is within normal limits. The lungs are clear. No pleural effusion.  No pneumothorax.  No acute osseous abnormality.  Abnormal orientation to the gastric band, currently in the approximate 9 o'clock to 3 o'clock orientation.  No free air beneath the diaphragms.  Retained contrast within nondilated renal collecting system.  Normal bowel gas pattern.  No abnormal radiopacity.   No acute osseous finding.  IMPRESSION: Abnormal lap band orientation; please see dedicated CT report dictated today for further evaluation.  No acute cardiopulmonary process.   Original Report Authenticated By: Christiana Pellant, M.D.     Anti-infectives: Anti-infectives   Start     Dose/Rate Route Frequency Ordered Stop   05/17/12 0600  cefOXitin (MEFOXIN) 2 g in dextrose 5 % 50 mL IVPB     2 g 100 mL/hr over 30 Minutes Intravenous On call to O.R. 05/16/12 1805 05/18/12 0559      Assessment/Plan: Slipped lap band. I discussed with the patient proceeding with laparoscopic revision of her band. We discussed the nature and indication of the procedure. We discussed risks including general anesthesia, bleeding, infection, visceral injury and possible need to remove the band. All her questions were answered and she understands and agrees to proceed.    LOS: 1 day    Alaze Garverick T 05/17/2012

## 2012-05-17 NOTE — Care Management Note (Signed)
    Page 1 of 1   05/17/2012     4:41:42 PM   CARE MANAGEMENT NOTE 05/17/2012  Patient:  Samantha Doyle, Samantha Doyle   Account Number:  [id number]  Date Initiated:  05/17/2012  Documentation initiated by:  Lorenda Ishihara  Subjective/Objective Assessment:   37 yo female admitted s/p lap band adjustment. PTA lived at home alone     Action/Plan:   Home when stable   Anticipated DC Date:  05/18/2012   Anticipated DC Plan:  HOME/SELF CARE      DC Planning Services  CM consult      Choice offered to / List presented to:             Status of service:  Completed, signed off Medicare Important Message given?   (If response is "NO", the following Medicare IM given date fields will be blank) Date Medicare IM given:   Date Additional Medicare IM given:    Discharge Disposition:  HOME/SELF CARE  Per UR Regulation:  Reviewed for med. necessity/level of care/duration of stay  If discussed at Long Length of Stay Meetings, dates discussed:    Comments:

## 2012-05-17 NOTE — ED Provider Notes (Signed)
Medical screening examination/treatment/procedure(s) were performed by non-physician practitioner and as supervising physician I was immediately available for consultation/collaboration.  Sylar Voong T Dionisia Pacholski, MD 05/17/12 1614 

## 2012-05-18 ENCOUNTER — Encounter (HOSPITAL_COMMUNITY): Payer: Self-pay | Admitting: General Surgery

## 2012-05-18 LAB — BASIC METABOLIC PANEL
BUN: 8 mg/dL (ref 6–23)
Calcium: 8.6 mg/dL (ref 8.4–10.5)
Creatinine, Ser: 0.72 mg/dL (ref 0.50–1.10)
GFR calc Af Amer: >90 mL/min (ref >90)

## 2012-05-18 LAB — CBC
HCT: 36.6 % (ref 36.0–46.0)
MCH: 28 pg (ref 26.0–34.0)
MCV: 85.3 fL (ref 78.0–100.0)
Platelets: 177 10*3/uL (ref 150–400)
RDW: 13.6 % (ref 11.5–15.5)

## 2012-05-18 MED ORDER — HYDROCODONE-ACETAMINOPHEN 10-325 MG PO TABS: 1.0000 | ORAL_TABLET | ORAL | Status: DC | PRN

## 2012-05-18 MED ORDER — ONDANSETRON HCL 4 MG PO TABS
4.0000 mg | ORAL_TABLET | Freq: Once | ORAL | Status: DC
  Filled 2012-05-18: qty 1

## 2012-05-18 MED ORDER — IBUPROFEN 600 MG PO TABS
600.0000 mg | ORAL_TABLET | Freq: Four times a day (QID) | ORAL | Status: DC | PRN
  Filled 2012-05-18: qty 1

## 2012-05-18 MED ORDER — PANTOPRAZOLE SODIUM 40 MG PO TBEC
40.0000 mg | DELAYED_RELEASE_TABLET | Freq: Every day | ORAL | Status: DC
  Administered 2012-05-18 – 2012-05-19 (×2): 40 mg via ORAL
  Filled 2012-05-18 (×4): qty 1

## 2012-05-18 MED ORDER — IBUPROFEN 600 MG PO TABS
600.0000 mg | ORAL_TABLET | Freq: Three times a day (TID) | ORAL | Status: DC | PRN
  Administered 2012-05-18 – 2012-05-19 (×2): 600 mg via ORAL
  Filled 2012-05-18 (×2): qty 1

## 2012-05-18 MED ORDER — ONDANSETRON HCL 4 MG/2ML IJ SOLN
4.0000 mg | Freq: Four times a day (QID) | INTRAMUSCULAR | Status: DC
  Administered 2012-05-18 – 2012-05-19 (×4): 4 mg via INTRAVENOUS
  Filled 2012-05-18 (×5): qty 2

## 2012-05-18 NOTE — Progress Notes (Signed)
Patient ID: Samantha Doyle, female   DOB: 1975/04/03, 37 y.o.   MRN: [id number] 1 Day Post-Op  Subjective: Doing ok this AM, nervous about taking POs, doesn't have any family or friends around here so worried about discharge here as well, BP has been up but she has been in pain and afraid to take pain meds, denies nausea right now  Objective: Vital signs in last 24 hours: Temp:  [97.6 F (36.4 C)-98.8 F (37.1 C)] 98.5 F (36.9 C) (05/13 0615) Pulse Rate:  [48-70] 53 (05/13 0615) Resp:  [13-18] 18 (05/13 0615) BP: (140-177)/(66-100) 155/93 mmHg (05/13 0615) SpO2:  [100 %] 100 % (05/13 0615) Last BM Date: 05/13/12  Intake/Output from previous day: 05/12 0701 - 05/13 0700 In: 3383.3 [I.V.:3383.3] Out: 900 [Urine:900] Intake/Output this shift:    General appearance: alert, cooperative and no distress GI: soft, mildly tender over incisions, incisions are c/d/i, +BS  Lab Results:   Recent Labs  05/17/12 0413 05/18/12 0435  WBC 5.4 7.7  HGB 10.7* 12.0  HCT 33.9* 36.6  PLT 174 177   BMET  Recent Labs  05/17/12 0413 05/18/12 0435  NA 139 138  K 3.2* 3.3*  CL 104 103  CO2 29 26  GLUCOSE 87 75  BUN 15 8  CREATININE 0.81 0.72  CALCIUM 8.5 8.6     Studies/Results: Ct Abdomen Pelvis W Contrast  05/16/2012  *RADIOLOGY REPORT*  Clinical Data: History of prior laparoscopic gastric band placement in 2009 at an outside institution.  The patient complains of nausea, poor intake and reflux symptoms.  CT ABDOMEN AND PELVIS WITH CONTRAST  Technique:  Multidetector CT imaging of the abdomen and pelvis was performed following the standard protocol during bolus administration of intravenous contrast.  Contrast: 50mL OMNIPAQUE IOHEXOL 300 MG/ML  SOLN, OMNIPAQUE IOHEXOL 300 MG/ML  SOLN  Comparison: None.  Findings: There is evidence of probable slippage of a gastric band which is located relatively low at the level of the stomach with a fairly large pouch present above the band.   The band is oriented nearly transversely and a 9 o'clock to 3 o'clock position.  There is no obvious evidence of gastric necrosis or perforation.  No abnormal fluid collection is seen.  Ingested oral contrast is seen primarily at the level of the gastric pouch above the gastric band.  However, diluted contrast is seen to enter the distal aspect of the stomach and the stomach is clearly not obstructed.  Other bowel loops are normal in caliber and show no evidence of obstruction.  Tubing attached to the band and extending to the level of a subcutaneous port appears intact.  The liver is unremarkable and shows focal hypodensity along the inferior margin of the falciform ligament within the lower left lobe likely representing focal fat.  The gallbladder, spleen, pancreas, adrenal glands and kidneys are unremarkable.  Within the pelvis, a small amount of free fluid is present at the level of the cul-de-sac.  The bladder, uterus and adnexal regions are unremarkable by CT.  No hernias are identified.  No incidental masses or enlarged lymph nodes.  Degenerative changes are present of the spine with associated anterolisthesis of L4 on L5.  IMPRESSION: There is evidence of slippage of the gastric band by imaging with a low-lying band present oriented in a transverse position.  There is no evidence of gastric obstruction or perforation.   Original Report Authenticated By: Irish Lack, M.D.    Dg Abd Acute W/chest  05/16/2012  *  RADIOLOGY REPORT*  Clinical Data: Unable to eat, lap band fluid removed several days ago  ACUTE ABDOMEN SERIES (ABDOMEN 2 VIEW & CHEST 1 VIEW)  Comparison: CT same date  Findings: Please see separate exam report dictated today.  Cardiomediastinal silhouette is within normal limits. The lungs are clear. No pleural effusion.  No pneumothorax.  No acute osseous abnormality.  Abnormal orientation to the gastric band, currently in the approximate 9 o'clock to 3 o'clock orientation.  No free air beneath  the diaphragms.  Retained contrast within nondilated renal collecting system.  Normal bowel gas pattern.  No abnormal radiopacity.  No acute osseous finding.  IMPRESSION: Abnormal lap band orientation; please see dedicated CT report dictated today for further evaluation.  No acute cardiopulmonary process.   Original Report Authenticated By: Christiana Pellant, M.D.     Anti-infectives: Anti-infectives   Start     Dose/Rate Route Frequency Ordered Stop   05/17/12 0600  cefOXitin (MEFOXIN) 2 g in dextrose 5 % 50 mL IVPB  Status:  Discontinued     2 g 100 mL/hr over 30 Minutes Intravenous On call to O.R. 05/16/12 1805 05/17/12 1551      Assessment/Plan:: POD#1-LAPAROSCOPIC REVISION OF SLIPPED GASTRIC BAND: doing ok but hasn't eaten yet, worried about discharge, will give full liquids today and see how she does, change iv meds to PO, PO meds for pain, ambulate in halls, keep today and plan for discharge tomorrow.     LOS: 2 days    Nason Conradt 05/18/2012

## 2012-05-18 NOTE — Progress Notes (Signed)
Patient interviewed and examined, agree with PA note above.  Mariella Saa MD, FACS  05/18/2012 6:18 PM

## 2012-05-18 NOTE — Anesthesia Postprocedure Evaluation (Signed)
  Anesthesia Post-op Note  Patient: Samantha Doyle  Procedure(s) Performed: Procedure(s) (LRB): LAPAROSCOPIC REVISION OF SLIPPED GASTRIC BAND (N/A)  Patient Location: PACU  Anesthesia Type: General  Level of Consciousness: awake and alert   Airway and Oxygen Therapy: Patient Spontanous Breathing  Post-op Pain: mild  Post-op Assessment: Post-op Vital signs reviewed, Patient's Cardiovascular Status Stable, Respiratory Function Stable, Patent Airway and No signs of Nausea or vomiting  Last Vitals:  Filed Vitals:   05/18/12 1000  BP: 152/100  Pulse: 53  Temp: 37.1 C  Resp: 18    Post-op Vital Signs: stable   Complications: No apparent anesthesia complications

## 2012-05-18 NOTE — ED Provider Notes (Signed)
Medical screening examination/treatment/procedure(s) were conducted as a shared visit with non-physician practitioner(s) and myself.  I personally evaluated the patient during the encounter.  Please see completed note.  Raeford Razor, MD 05/18/12 1440

## 2012-05-19 ENCOUNTER — Encounter (INDEPENDENT_AMBULATORY_CARE_PROVIDER_SITE_OTHER): Payer: Self-pay | Admitting: Internal Medicine

## 2012-05-19 MED ORDER — PANTOPRAZOLE SODIUM 40 MG PO TBEC: 40.0000 mg | DELAYED_RELEASE_TABLET | Freq: Every day | ORAL | Status: DC

## 2012-05-19 MED ORDER — HYDROCODONE-ACETAMINOPHEN 10-325 MG PO TABS: 1.0000 | ORAL_TABLET | ORAL | Status: DC | PRN

## 2012-05-19 NOTE — Discharge Summary (Signed)
Physician Discharge Summary  Patient ID: Samantha Doyle MRN: [id number] DOB/AGE: [birth year] 38 y.o.  Admit date: 05/16/2012 Discharge date: 05/19/2012  Admitting Diagnosis: Vomiting Nausea Lap band problems  Discharge Diagnosis Patient Active Problem List   Diagnosis Date Noted  . Lapband APS 2009 [facility] 05/16/2012  . Encounter for fitting or adjustment of gastric lap band 05/13/2012    Consultants None  Procedures 05/17/12 LAPAROSCOPIC REVISION OF SLIPPED GASTRIC BAND by Dr. Vikki Ports Course:  37 yr old female who presented to [facility] with difficulty keeping liquids down.  She has undergone a lapband in [place] in 2009.  Her band had slipped and she had seen Dr. Andrey Campanile in regards to this.  Unfortunately she becaome unable to keep even liquids down and presented to the ED with nausea and vomiting.  She was admitted and underwent the procedure above.  Post-op she was able to tolerate full liquids without problems.  She did have a lot of inflammation in her pouch therefore it was recommended that she stay on a full liquid diet for one week.  She will follow up with Dr. Johna Sheriff in 2 weeks.  She may return to work in 7 days but avoid heavy lifting.    Physical Exam VSS afebrile Heart: RRR Lungs: CTA bil Abd: soft, mildly tender over incisions, c/d/i, +BS     Medication List    TAKE these medications       albuterol 108 (90 BASE) MCG/ACT inhaler  Commonly known as:  PROVENTIL HFA;VENTOLIN HFA  Inhale 2 puffs into the lungs every 4 (four) hours as needed for wheezing or shortness of breath (cough).     calcium carbonate 500 MG chewable tablet  Commonly known as:  TUMS - dosed in mg elemental calcium  Chew 2 tablets by mouth 2 (two) times daily as needed. For nausea     cholecalciferol 1000 UNITS tablet  Commonly known as:  VITAMIN D  Take 2,000 Units by mouth 2 (two) times daily.     clonazePAM 1 MG tablet  Commonly known as:  KLONOPIN  Take 1 mg by mouth 2  (two) times daily as needed. For anxiety     cyanocobalamin 1000 MCG/ML injection  Commonly known as:  (VITAMIN B-12)  Inject 1,000 mcg into the muscle every 14 (fourteen) days.     HYDROcodone-acetaminophen 10-325 MG per tablet  Commonly known as:  NORCO  Take 1-2 tablets by mouth every 4 (four) hours as needed.     multivitamin with minerals Tabs  Take 1 tablet by mouth daily.     naproxen sodium 220 MG tablet  Commonly known as:  ANAPROX  Take 220 mg by mouth 2 (two) times daily with a meal.     pantoprazole 40 MG tablet  Commonly known as:  PROTONIX  Take 1 tablet (40 mg total) by mouth daily.     PARoxetine 20 MG tablet  Commonly known as:  PAXIL  Take 20 mg by mouth every morning.     promethazine 25 MG suppository  Commonly known as:  PHENERGAN  Place 1 suppository (25 mg total) rectally every 6 (six) hours as needed for nausea.     promethazine 12.5 MG tablet  Commonly known as:  PHENERGAN  Take 1-2 tablets (12.5-25 mg total) by mouth every 6 (six) hours as needed for nausea.             Follow-up Information   Follow up with HOXWORTH,BENJAMIN T, MD In 2 weeks. (Post-op  recheck)    Contact information:   [address] [place] [address] [phone]       Signed: Denny Levy [facility] [phone]  05/19/2012, 8:15 AM

## 2012-05-19 NOTE — Discharge Instructions (Signed)
[facility] ______[facility] °LAPAROSCOPIC SURGERY: POST OP INSTRUCTIONS °Always review your discharge instruction sheet given to you by the facility where your surgery was performed. °IF YOU HAVE DISABILITY OR FAMILY LEAVE FORMS, YOU MUST BRING THEM TO THE OFFICE FOR PROCESSING.   °DO NOT GIVE THEM TO YOUR DOCTOR. ° °1. A prescription for pain medication may be given to you upon discharge.  Take your pain medication as prescribed, if needed.  If narcotic pain medicine is not needed, then you may take acetaminophen (Tylenol) or ibuprofen (Advil) as needed. °2. Take your usually prescribed medications unless otherwise directed. °3. If you need a refill on your pain medication, please contact your pharmacy.  They will contact our office to request authorization. Prescriptions will not be filled after 5pm or on week-ends. °4. You should follow a light diet the first few days after arrival home, such as soup and crackers, etc.  Be sure to include lots of fluids daily. °5. Most patients will experience some swelling and bruising in the area of the incisions.  Ice packs will help.  Swelling and bruising can take several days to resolve.  °6. It is common to experience some constipation if taking pain medication after surgery.  Increasing fluid intake and taking a stool softener (such as Colace) will usually help or prevent this problem from occurring.  A mild laxative (Milk of Magnesia or Miralax) should be taken according to package instructions if there are no bowel movements after 48 hours. °7. Unless discharge instructions indicate otherwise, you may remove your bandages 24-48 hours after surgery, and you may shower at that time.  You may have steri-strips (small skin tapes) in place directly over the incision.  These strips should be left on the skin for 7-10 days.  If your surgeon used skin glue on the incision, you may shower in 24 hours.  The glue will flake off over the next 2-3 weeks.  Any sutures or  staples will be removed at the office during your follow-up visit. °8. ACTIVITIES:  You may resume regular (light) daily activities beginning the next day--such as daily self-care, walking, climbing stairs--gradually increasing activities as tolerated.  You may have sexual intercourse when it is comfortable.  Refrain from any heavy lifting or straining until approved by your doctor. °a. You may drive when you are no longer taking prescription pain medication, you can comfortably wear a seatbelt, and you can safely maneuver your car and apply brakes. °b. RETURN TO WORK:  __________________________________________________________ °9. You should see your doctor in the office for a follow-up appointment approximately 2-3 weeks after your surgery.  Make sure that you call for this appointment within a day or two after you arrive home to insure a convenient appointment time. °10. OTHER INSTRUCTIONS: __________________________________________________________________________________________________________________________ __________________________________________________________________________________________________________________________ °WHEN TO CALL YOUR DOCTOR: °1. Fever over 101.0 °2. Inability to urinate °3. Continued bleeding from incision. °4. Increased pain, redness, or drainage from the incision. °5. Increasing abdominal pain ° °The clinic staff is available to answer your questions during regular business hours.  Please don’t hesitate to call and ask to speak to one of the nurses for clinical concerns.  If you have a medical emergency, go to the nearest emergency room or call 911.  A surgeon from [facility] is always on call at the hospital. °[address], [address], [place], [place]  [address] ? [address] [address], [place],    [address] °[phone] ? [phone] ? FAX [phone] °Web site:   [email or web address] °

## 2012-05-19 NOTE — Discharge Summary (Signed)
Patient interviewed and examined, agree with PA note above.  Mariella Saa MD, FACS  05/19/2012 2:52 PM

## 2012-05-20 ENCOUNTER — Telehealth (INDEPENDENT_AMBULATORY_CARE_PROVIDER_SITE_OTHER): Payer: Self-pay

## 2012-05-20 NOTE — Telephone Encounter (Signed)
Patient called in asking about what exercises she could do because she does not like to sit still and feels stiff. I told her no exercise since she is only 3 days out from surgery but told her she could walk for about an hour. I told her she could stretch her legs and arms if they were stiff as long as she is not putting stress in her abdomin. She asked if she should have had a BM yet. She did not eat anything Monday and was only on clears Tuesday. Yesterday was first time she ate yogurt. I told her since it has only been 1 day to give it more time to see if she has BM. She will call us tomorrow afternoon if she does not have BM before that. She asked how often she should eat. I told her to have at least 4-6 of her small meals a day.

## 2012-05-25 ENCOUNTER — Telehealth (INDEPENDENT_AMBULATORY_CARE_PROVIDER_SITE_OTHER): Payer: Self-pay

## 2012-05-25 NOTE — Telephone Encounter (Signed)
Called patient with post op appt date & time for 06/03/12 @ 8:50 am w/Dr. Johna Sheriff.

## 2012-05-25 NOTE — Telephone Encounter (Signed)
Message copied by Maryan Puls on Tue May 25, 2012  9:20 AM ------      Message from: Ivory Broad      Created: Wed May 19, 2012  9:20 AM                   ----- Message -----         From: Doristine Mango, PA-C         Sent: 05/19/2012   8:07 AM           To: [facility] Clinical Pool            Pt needs a follow up appt in 2 weeks to see Dr. Johna Sheriff.  She needs a morning appt because of work.  She is post-op lap band adjustment.  She is being discharged today.  If you send me the info i can put it in her discharge instructions.            Thanks      liz ------

## 2012-06-01 ENCOUNTER — Telehealth (INDEPENDENT_AMBULATORY_CARE_PROVIDER_SITE_OTHER): Payer: Self-pay | Admitting: General Surgery

## 2012-06-01 NOTE — Telephone Encounter (Signed)
Patient cancelled follow up for tomorrow with Dr Johna Sheriff. She has a flat tire and can not find a ride here. She needs to r/s her appt with Dr Johna Sheriff and first available is the end of July. Please make another follow up for patient. [phone].

## 2012-06-03 ENCOUNTER — Encounter (INDEPENDENT_AMBULATORY_CARE_PROVIDER_SITE_OTHER): Payer: Managed Care, Other (non HMO) | Admitting: General Surgery

## 2012-06-30 ENCOUNTER — Encounter (INDEPENDENT_AMBULATORY_CARE_PROVIDER_SITE_OTHER): Payer: Self-pay | Admitting: General Surgery

## 2012-06-30 ENCOUNTER — Ambulatory Visit (INDEPENDENT_AMBULATORY_CARE_PROVIDER_SITE_OTHER): Payer: Managed Care, Other (non HMO) | Admitting: General Surgery

## 2012-06-30 VITALS — BP 126/78 | HR 86 | Temp 98.1°F | Resp 16 | Ht 63.0 in | Wt 170.2 lb

## 2012-06-30 DIAGNOSIS — Z9884 Bariatric surgery status: Secondary | ICD-10-CM

## 2012-06-30 NOTE — Progress Notes (Signed)
Chief complaint: Followup laparoscopic revision of slipped lap band  History: Patient returns for followup after laparoscopic revision of slipped lap band with obstruction. She had an AP standard band placed in [place] in 2009. She had remarkable weight loss of over 180 pounds. However she developed a slip and obstruction.  She is now 4 weeks postop. She feels well with no dysphagia or abdominal pain. She however is eating more than she feels she should it is putting on weight. It became apparent during her hospitalization in discussion with our nurse coordinator that she was binging and purging prior to her slip. our coordinator has arranged for her to join our support group which she is attending and she has an appointment to see a therapist as well.  Exam: BP 126/78  Pulse 86  Temp(Src) 98.1 F (36.7 C) (Temporal)  Resp 16  Ht 5\' 3"  (1.6 m)  Wt 170 lb 3.2 oz (77.202 kg)  BMI 30.16 kg/m2 Weight loss 178 pounds from original surgery General: Well-appearing in no distress Abdomen: Soft and nontender.  Assessment and plan: Doing well following revision of slipped lap band with no complications identified.  We elected to go ahead with a fill today. I accessed her port without difficulty. We had previously emptied it. I added 2 cc.  She was able to tolerate water well. Return in one month.

## 2012-07-01 ENCOUNTER — Encounter (INDEPENDENT_AMBULATORY_CARE_PROVIDER_SITE_OTHER): Payer: Managed Care, Other (non HMO) | Admitting: General Surgery

## 2012-07-29 ENCOUNTER — Encounter (INDEPENDENT_AMBULATORY_CARE_PROVIDER_SITE_OTHER): Payer: Managed Care, Other (non HMO)

## 2012-08-05 ENCOUNTER — Telehealth (INDEPENDENT_AMBULATORY_CARE_PROVIDER_SITE_OTHER): Payer: Self-pay

## 2012-08-05 ENCOUNTER — Encounter (INDEPENDENT_AMBULATORY_CARE_PROVIDER_SITE_OTHER): Payer: Managed Care, Other (non HMO)

## 2012-08-06 NOTE — Telephone Encounter (Signed)
Attempted to call patient to see why she missed appointment and see if she wanted to reschedule, the line was busy.

## 2012-08-12 ENCOUNTER — Encounter (INDEPENDENT_AMBULATORY_CARE_PROVIDER_SITE_OTHER): Payer: Managed Care, Other (non HMO)

## 2012-09-02 ENCOUNTER — Ambulatory Visit (INDEPENDENT_AMBULATORY_CARE_PROVIDER_SITE_OTHER): Payer: Managed Care, Other (non HMO) | Admitting: Physician Assistant

## 2012-09-02 ENCOUNTER — Encounter (INDEPENDENT_AMBULATORY_CARE_PROVIDER_SITE_OTHER): Payer: Self-pay

## 2012-09-02 VITALS — BP 138/98 | HR 76 | Temp 98.1°F | Resp 16 | Ht 63.0 in | Wt 193.4 lb

## 2012-09-02 DIAGNOSIS — Z4651 Encounter for fitting and adjustment of gastric lap band: Secondary | ICD-10-CM

## 2012-09-02 NOTE — Progress Notes (Signed)
  HISTORY: Samantha Doyle is a 37 y.o.female who received an AP-Standard lap-band in March 2009 in [place] with a revision in May 2014 by Dr. Johna Sheriff. She comes in with 23 lbs weight gain, hunger and larger than desired portion sizes. She has no complaints of regurgitation or reflux. She wants a fill today to halt her weight gain and get back on track with eating.  VITAL SIGNS: Filed Vitals:   09/02/12 1348  BP: 138/98  Pulse: 76  Temp: 98.1 F (36.7 C)  Resp: 16    PHYSICAL EXAM: Physical exam reveals a very well-appearing 37 y.o.female in no apparent distress Neurologic: Awake, alert, oriented Psych: Bright affect, conversant Respiratory: Breathing even and unlabored. No stridor or wheezing Abdomen: Soft, nontender, nondistended to palpation. Incisions well-healed. No incisional hernias. Port easily palpated. Extremities: Atraumatic, good range of motion.  ASSESMENT: 37 y.o.  female  s/p AP-Standard lap-band.   PLAN: The patient's port was accessed with a 20G Huber needle without difficulty. Clear fluid was aspirated and 1 mL saline was added to the port. The patient was able to swallow water without difficulty following the procedure and was instructed to take clear liquids for the next 24-48 hours and advance slowly as tolerated.

## 2012-09-02 NOTE — Patient Instructions (Signed)

## 2012-09-30 ENCOUNTER — Encounter (INDEPENDENT_AMBULATORY_CARE_PROVIDER_SITE_OTHER): Payer: Self-pay

## 2012-09-30 ENCOUNTER — Ambulatory Visit (INDEPENDENT_AMBULATORY_CARE_PROVIDER_SITE_OTHER): Payer: Managed Care, Other (non HMO) | Admitting: Physician Assistant

## 2012-09-30 VITALS — BP 140/94 | HR 76 | Temp 97.4°F | Resp 16 | Ht 63.0 in | Wt 195.8 lb

## 2012-09-30 DIAGNOSIS — Z4651 Encounter for fitting and adjustment of gastric lap band: Secondary | ICD-10-CM

## 2012-09-30 NOTE — Patient Instructions (Signed)

## 2012-09-30 NOTE — Progress Notes (Signed)
  HISTORY: Samantha Doyle is a 37 y.o.female who received an AP-Standard lap-band in March 2009 with a revision of a slip in May 2014 by Dr. Johna Sheriff. She comes in with 2 lbs weight gain since her last fill. She has no complaints of regurgitation or reflux. She does describe continued hunger and larger than desired portion sizes but this improved somewhat after the last fill. She had 4 mL total in the band prior to revision. She is at 3 mL now.  VITAL SIGNS: Filed Vitals:   09/30/12 1343  BP: 140/94  Pulse: 76  Temp: 97.4 F (36.3 C)  Resp: 16    PHYSICAL EXAM: Physical exam reveals a very well-appearing 37 y.o.female in no apparent distress Neurologic: Awake, alert, oriented Psych: Bright affect, conversant Respiratory: Breathing even and unlabored. No stridor or wheezing Abdomen: Soft, nontender, nondistended to palpation. Incisions well-healed. No incisional hernias. Port easily palpated. Extremities: Atraumatic, good range of motion.  ASSESMENT: 37 y.o.  female  s/p AP-Standard lap-band.   PLAN: The patient's port was accessed with a 20G Huber needle without difficulty. Clear fluid was aspirated and 0.75 mL saline was added to the port to give a total predicted volume of 3.75 mL. The patient was able to swallow water without difficulty following the procedure and was instructed to take clear liquids for the next 24-48 hours and advance slowly as tolerated.

## 2012-10-28 ENCOUNTER — Encounter (INDEPENDENT_AMBULATORY_CARE_PROVIDER_SITE_OTHER): Payer: Managed Care, Other (non HMO)

## 2012-11-11 ENCOUNTER — Other Ambulatory Visit: Payer: Self-pay

## 2012-11-11 ENCOUNTER — Encounter (INDEPENDENT_AMBULATORY_CARE_PROVIDER_SITE_OTHER): Payer: Managed Care, Other (non HMO)

## 2012-11-25 ENCOUNTER — Encounter (INDEPENDENT_AMBULATORY_CARE_PROVIDER_SITE_OTHER): Payer: Managed Care, Other (non HMO)

## 2013-01-14 ENCOUNTER — Encounter (INDEPENDENT_AMBULATORY_CARE_PROVIDER_SITE_OTHER): Payer: Managed Care, Other (non HMO)

## 2013-03-17 ENCOUNTER — Ambulatory Visit (INDEPENDENT_AMBULATORY_CARE_PROVIDER_SITE_OTHER): Payer: BC Managed Care – PPO | Admitting: Physician Assistant

## 2013-03-17 ENCOUNTER — Encounter (INDEPENDENT_AMBULATORY_CARE_PROVIDER_SITE_OTHER): Payer: Self-pay

## 2013-03-17 VITALS — BP 124/80 | HR 86 | Temp 99.2°F | Resp 14 | Ht 63.0 in | Wt 225.0 lb

## 2013-03-17 DIAGNOSIS — Z4651 Encounter for fitting and adjustment of gastric lap band: Secondary | ICD-10-CM

## 2013-03-17 NOTE — Progress Notes (Signed)
  HISTORY: Joan MayansHeather A Kyler is a 38 y.o.female who [place] a revision in May 2014 by Dr. Johna SheriffHoxworth. She comes in with 30 lbs weight gain since her last visit in September. There was a 0.75 mL fill that day. She has had back pain with physician directed activity restriction. She complains of significant hunger and larger than desired portion sizes. She has no regurgitation or reflux symptoms.  VITAL SIGNS: Filed Vitals:   03/17/13 1007  BP: 124/80  Pulse: 86  Temp: 99.2 F (37.3 C)  Resp: 14    PHYSICAL EXAM: Physical exam reveals a very well-appearing 38 y.o.female in no apparent distress Neurologic: Awake, alert, oriented Psych: Bright affect, conversant Respiratory: Breathing even and unlabored. No stridor or wheezing Abdomen: Soft, nontender, nondistended to palpation. Incisions well-healed. No incisional hernias. Port easily palpated. Extremities: Atraumatic, good range of motion.  ASSESMENT: 38 y.o.  female  s/p AP-Standard lap-band.   PLAN: The patient's port was accessed with a 20G Huber needle without difficulty. Clear fluid was aspirated and 1 mL saline was added to the port to give a total predicted volume of 4.75 mL. This was after verification of 3.75 mL in the band. The patient was able to swallow water without difficulty following the procedure and was instructed to take clear liquids for the next 24-48 hours and advance slowly as tolerated.

## 2013-03-17 NOTE — Patient Instructions (Signed)

## 2013-05-19 ENCOUNTER — Encounter (INDEPENDENT_AMBULATORY_CARE_PROVIDER_SITE_OTHER): Payer: BC Managed Care – PPO

## 2013-06-22 ENCOUNTER — Emergency Department (HOSPITAL_COMMUNITY)
Admission: EM | Admit: 2013-06-22 | Discharge: 2013-06-22 | Disposition: A | Discharge status: Home / Self Care | Payer: BC Managed Care – PPO | Attending: Emergency Medicine | Admitting: Emergency Medicine

## 2013-06-22 ENCOUNTER — Encounter (HOSPITAL_COMMUNITY): Payer: Self-pay | Admitting: Emergency Medicine

## 2013-06-22 DIAGNOSIS — Z8742 Personal history of other diseases of the female genital tract: Secondary | ICD-10-CM | POA: Insufficient documentation

## 2013-06-22 DIAGNOSIS — Z9884 Bariatric surgery status: Secondary | ICD-10-CM | POA: Insufficient documentation

## 2013-06-22 DIAGNOSIS — Z79899 Other long term (current) drug therapy: Secondary | ICD-10-CM | POA: Insufficient documentation

## 2013-06-22 DIAGNOSIS — F17200 Nicotine dependence, unspecified, uncomplicated: Secondary | ICD-10-CM | POA: Insufficient documentation

## 2013-06-22 DIAGNOSIS — M129 Arthropathy, unspecified: Secondary | ICD-10-CM | POA: Insufficient documentation

## 2013-06-22 DIAGNOSIS — Z8709 Personal history of other diseases of the respiratory system: Secondary | ICD-10-CM | POA: Insufficient documentation

## 2013-06-22 DIAGNOSIS — I809 Phlebitis and thrombophlebitis of unspecified site: Secondary | ICD-10-CM

## 2013-06-22 DIAGNOSIS — F329 Major depressive disorder, single episode, unspecified: Secondary | ICD-10-CM | POA: Insufficient documentation

## 2013-06-22 DIAGNOSIS — I10 Essential (primary) hypertension: Secondary | ICD-10-CM | POA: Insufficient documentation

## 2013-06-22 DIAGNOSIS — G8929 Other chronic pain: Secondary | ICD-10-CM | POA: Insufficient documentation

## 2013-06-22 DIAGNOSIS — F3289 Other specified depressive episodes: Secondary | ICD-10-CM | POA: Insufficient documentation

## 2013-06-22 DIAGNOSIS — Z791 Long term (current) use of non-steroidal anti-inflammatories (NSAID): Secondary | ICD-10-CM | POA: Insufficient documentation

## 2013-06-22 NOTE — Discharge Instructions (Signed)
Use warm compresses and rests your knee. Take aspirin or anti-inflammatory pain medications to help with symptoms. Followup with a primary care provider for continuing evaluation and treatment.    Phlebitis Phlebitis is soreness and swelling (inflammation) of a vein. This can occur in your arms, legs, or torso (trunk), as well as deeper inside your body. Phlebitis is usually not serious when it occurs close to the surface of the body. However, it can cause serious problems when it occurs in a vein deeper inside the body. CAUSES  Phlebitis can be triggered by various things, including:   Reduced blood flow through your veins. This can happen with:  Bed rest over a long period.  Long-distance travel.  Injury.  Surgery.  Being overweight (obese) or pregnant.  Having an IV tube put in the vein and getting certain medicines through the vein.  Cancer and cancer treatment.  Use of illegal drugs taken through the vein.  Inflammatory diseases.  Inherited (genetic) diseases that increase the risk of blood clots.  Hormone therapy, such as birth control pills. SIGNS AND SYMPTOMS   Red, tender, swollen, and painful area on your skin. Usually, the area will be long and narrow.  Firmness along the center of the affected area. This can indicate that a blood clot has formed.  Low-grade fever. DIAGNOSIS  A health care provider can usually diagnose phlebitis by examining the affected area and asking about your symptoms. To check for infection or blood clots, your health care provider may order blood tests or an ultrasound exam of the area. Blood tests and your family history may also indicate if you have an underlying genetic disease that causes blood clots. Occasionally, a piece of tissue is taken from the body (biopsy sample) if an unusual cause of phlebitis is suspected. TREATMENT  Treatment will vary depending on the severity of the condition and the area of the body affected. Treatment  may include:  Use of a warm compress or heating pad.  Use of compression stockings or bandages.  Anti-inflammatory medicines.  Removal of any IV tube that may be causing the problem.  Medicines that kill germs (antibiotics) if an infection is present.  Blood-thinning medicines if a blood clot is suspected or present.  In rare cases, surgery may be needed to remove damaged sections of vein. HOME CARE INSTRUCTIONS   Only take over-the-counter or prescription medicines as directed by your health care provider. Take all medicines exactly as prescribed.  Raise (elevate) the affected area above the level of your heart as directed by your health care provider.  Apply a warm compress or heating pad to the affected area as directed by your health care provider. Do not sleep with the heating pad.  Use compression stockings or bandages as directed. These will speed healing and prevent the condition from coming back.  If you are on blood thinners:  Get follow-up blood tests as directed by your health care provider.  Check with your health care provider before using any new medicines.  Carry a medical alert card or wear your medical alert jewelry to show that you are on blood thinners.  For phlebitis in the legs:  Avoid prolonged standing or bed rest.  Keep your legs moving. Raise your legs when sitting or lying.  Do not smoke.  Women, particularly those over the age of 39, should consider the risks and benefits of taking the contraceptive pill. This kind of hormone treatment can increase your risk for blood clots.  Follow  up with your health care provider as directed. SEEK MEDICAL CARE IF:   You have unusual bruising or any bleeding problems.  Your swelling or pain in the affected area is not improving.  You are on anti-inflammatory medicine, and you develop belly (abdominal) pain. SEEK IMMEDIATE MEDICAL CARE IF:   You have a sudden onset of chest pain or difficulty  breathing.  You have a fever or persistent symptoms for more than 2-3 days.  You have a fever and your symptoms suddenly get worse. MAKE SURE YOU:  Understand these instructions.  Will watch your condition.  Will get help right away if you are not doing well or get worse. Document Released: 12/17/2000 Document Revised: 10/13/2012 Document Reviewed: 08/30/2012 [facility] Patient Information 2015 [employer] [place]. This information is not intended to replace advice given to you by your health care provider. Make sure you discuss any questions you have with your health care provider.

## 2013-06-22 NOTE — ED Provider Notes (Signed)
CSN: [id number]     Arrival date & time 06/22/13  0208 History   First MD Initiated Contact with Patient 06/22/13 0327     Chief Complaint  Patient presents with  . Leg Pain   HPI    History provided by the patient. Patient is a 38 year old female history of hypertension, LAP-BAND surgery, chronic back pain and varicose veins who presents with complaints of pain and swelling of the vein on her left lower extremity. Patient reports prior history of vascular procedures to remove varicose veins in bilateral legs. She reports having multiple procedures to remove veins completely in the right extremity however only did partial procedures for the left extremity and she has continued to have significant varicose vein. Earlier today when she was home she was feeling well but her dog suddenly bumped in brush into her left leg and knee area and she began to notice some discomfort. Since then she has had some swelling of the vein around the knee and lower leg area which had become tender and causing difficulty moving her leg. She denies similar symptoms previously. She denies any other associated symptoms. No chest pain or shortness of breath or hemoptysis. She has not used any treatment for her symptoms. She denies any history of IV drug use. No fever, chills or sweats.   Past Medical History  Diagnosis Date  . LAP-BAND surgery status   . Hypertension   . Bronchitis   . Arthritis   . Cervical dysplasia   . Depression   . Chronic back pain   . Degenerative disc disease   . Spondylarthrosis    Past Surgical History  Procedure Laterality Date  . Laparoscopic gastric banding  03/2007    [facility] Dr Jorja Loaim Hipp  . Carpal tunnel release    . Varicose vein surgery    . Cervical cone biopsy    . Laparoscopic revision of gastric band N/A 05/17/2012    Procedure: LAPAROSCOPIC REVISION OF SLIPPED GASTRIC BAND;  Surgeon: Mariella SaaBenjamin T Hoxworth, MD;  Location: [facility];  Service:  General;  Laterality: N/A;  . Nephrectomy transplanted organ     No family history on file. History  Substance Use Topics  . Smoking status: Current Every Day Smoker -- 0.50 packs/day    Types: Cigarettes  . Smokeless tobacco: Never Used  . Alcohol Use: No   OB History   Grav Para Term Preterm Abortions TAB SAB Ect Mult Living                 Review of Systems  Constitutional: Negative for fever, chills and diaphoresis.  Respiratory: Negative for shortness of breath.   Cardiovascular: Negative for chest pain and palpitations.  All other systems reviewed and are negative.     Allergies  Demerol and Monosodium glutamate  Home Medications   Prior to Admission medications   Medication Sig Start Date End Date Taking? Authorizing Provider  albuterol (PROVENTIL HFA;VENTOLIN HFA) 108 (90 BASE) MCG/ACT inhaler Inhale 2 puffs into the lungs every 4 (four) hours as needed for wheezing or shortness of breath (cough). 12/15/10 12/14/12  Felisa BonierMichael D Connor, MD  cholecalciferol (VITAMIN D) 1000 UNITS tablet Take 2,000 Units by mouth 2 (two) times daily.    Historical Provider, MD  clonazePAM (KLONOPIN) 1 MG tablet Take 1 mg by mouth 2 (two) times daily as needed. For anxiety    Historical Provider, MD  cyanocobalamin (,VITAMIN B-12,) 1000 MCG/ML injection Inject 1,000 mcg into the  muscle every 14 (fourteen) days.     Historical Provider, MD  gabapentin (NEURONTIN) 300 MG capsule  07/21/12   Historical Provider, MD  HYDROcodone-acetaminophen (NORCO) 10-325 MG per tablet Take 1-2 tablets by mouth every 4 (four) hours as needed. 05/19/12   Doristine MangoElizabeth White, PA-C  HYDROcodone-acetaminophen [facility](NORCO) 10-325 MG per tablet  08/12/12   Historical Provider, MD  Multiple Vitamin (MULITIVITAMIN WITH MINERALS) TABS Take 1 tablet by mouth daily.      Historical Provider, MD  naproxen sodium (ANAPROX) 220 MG tablet Take 220 mg by mouth 2 (two) times daily with a meal.    Historical Provider, MD  pantoprazole  (PROTONIX) 40 MG tablet Take 1 tablet (40 mg total) by mouth daily. 05/19/12   Doristine MangoElizabeth White, PA-C  PARoxetine (PAXIL) 20 MG tablet Take 20 mg by mouth every morning.    Historical Provider, MD   BP 127/97  Pulse 89  Temp(Src) 98.4 F (36.9 C) (Oral)  Resp 20  Ht 5\' 3"  (1.6 m)  Wt 240 lb (108.863 kg)  BMI 42.52 kg/m2  SpO2 97%  LMP 05/23/2013 Physical Exam  Nursing note and vitals reviewed. Constitutional: She is oriented to person, place, and time. She appears well-developed and well-nourished. No distress.  HENT:  Head: Normocephalic.  Cardiovascular: Normal rate and regular rhythm.   Pulmonary/Chest: Effort normal and breath sounds normal. No respiratory distress. She has no wheezes. She has no rales.  Musculoskeletal:  There is swelling and tenderness of the superficial veins of the left lower extremity tracking up the anterior portion and over the knee. Patient is now swollen and tender over the knee. No significant erythema. No increased warmth. No swelling of the extremity. No signs concerning for DVT.  Neurological: She is alert and oriented to person, place, and time.  Skin: Skin is warm and dry. No rash noted.  Psychiatric: She has a normal mood and affect. Her behavior is normal.    ED Course  Procedures   COORDINATION OF CARE:  Nursing notes reviewed. Vital signs reviewed. Initial pt interview and examination performed.   Filed Vitals:   06/22/13 0222  BP: 127/97  Pulse: 89  Temp: 98.4 F (36.9 C)  TempSrc: Oral  Resp: 20  Height: 5\' 3"  (1.6 m)  Weight: 240 lb (108.863 kg)  SpO2: 97%    3:41 AM-patient seen and evaluated. Patient well-appearing no acute distress. Does appear mild discomfort. There is a large tender superficial thrombophlebitis of the left lower extremity especially over the knee area. Patient without any other concerning findings or symptoms. Discussed treatment plan warm compresses, elevation and mild compression. Patient instructed to  followup with PCP.  Patient does have prescriptions for diclofenac as well as norco. She has not taken this but I instructed her she may use this for some of her pain symptoms. She is also instructed to use warm compresses and elevation. Strict return precautions given.   MDM   Final diagnoses:  Superficial thrombophlebitis      Angus Sellereter S Dammen, PA-C 06/22/13 0406

## 2013-06-22 NOTE — ED Notes (Signed)
Pt c/o L leg pain and swelling. Pt has large varicose veins, she states dog hit L leg now area around knee edematous and painful.

## 2013-06-22 NOTE — ED Notes (Signed)
Ace wrap applied. Circulation checked. + for capillary refill of toes.

## 2013-06-23 ENCOUNTER — Encounter (INDEPENDENT_AMBULATORY_CARE_PROVIDER_SITE_OTHER): Payer: BC Managed Care – PPO

## 2013-06-24 NOTE — ED Provider Notes (Signed)
Medical screening examination/treatment/procedure(s) were performed by non-physician practitioner and as supervising physician I was immediately available for consultation/collaboration.   EKG Interpretation None        David Yelverton, MD 06/24/13 2343 

## 2013-08-25 ENCOUNTER — Ambulatory Visit (INDEPENDENT_AMBULATORY_CARE_PROVIDER_SITE_OTHER): Payer: BC Managed Care – PPO | Admitting: Physician Assistant

## 2013-08-25 ENCOUNTER — Encounter (INDEPENDENT_AMBULATORY_CARE_PROVIDER_SITE_OTHER): Payer: Self-pay

## 2013-08-25 VITALS — BP 136/88 | HR 64 | Temp 98.6°F | Resp 14 | Ht 63.0 in | Wt 244.4 lb

## 2013-08-25 DIAGNOSIS — Z4651 Encounter for fitting and adjustment of gastric lap band: Secondary | ICD-10-CM

## 2013-08-25 NOTE — Progress Notes (Signed)
  HISTORY: Samantha MayansHeather A Gehring is a 38 y.o.female who received an AP-Standard lap-band in March 2009 with a revision in May 2014 by Dr. Johna SheriffHoxworth. The patient has gained 19 lbs since their last visit in March, and has lost 103 lbs since surgery. She reports significant hunger and large portion sizes over the course of the past few months, yielding significant weight gain. She has no regurgitation or reflux symptoms. She is asking for a fill to keep her eating under control.  VITAL SIGNS: Filed Vitals:   08/25/13 1214  BP: 136/88  Pulse: 64  Temp: 98.6 F (37 C)  Resp: 14    PHYSICAL EXAM: Physical exam reveals a very well-appearing 38 y.o.female in no apparent distress Neurologic: Awake, alert, oriented Psych: Bright affect, conversant Respiratory: Breathing even and unlabored. No stridor or wheezing Abdomen: Soft, nontender, nondistended to palpation. Incisions well-healed. No incisional hernias. Port easily palpated. Extremities: Atraumatic, good range of motion.  ASSESMENT: 38 y.o.  female  s/p AP-Standard lap-band.   PLAN: The patient's port was accessed with a 20G Huber needle without difficulty. Clear fluid was aspirated and 0.5 mL saline was added to the port to give a total predicted volume of 5.25 mL. The patient was able to swallow water without difficulty following the procedure and was instructed to take clear liquids for the next 24-48 hours and advance slowly as tolerated. I asked her to return in one month or sooner as needed.

## 2013-08-25 NOTE — Patient Instructions (Signed)

## 2013-10-06 ENCOUNTER — Ambulatory Visit: Payer: Self-pay | Admitting: Family Medicine

## 2013-10-06 ENCOUNTER — Other Ambulatory Visit: Payer: Self-pay | Admitting: Family Medicine

## 2013-10-06 ENCOUNTER — Encounter (INDEPENDENT_AMBULATORY_CARE_PROVIDER_SITE_OTHER): Payer: BC Managed Care – PPO

## 2014-03-19 ENCOUNTER — Encounter (HOSPITAL_COMMUNITY): Payer: Self-pay

## 2014-03-19 ENCOUNTER — Emergency Department (HOSPITAL_COMMUNITY)
Admission: EM | Admit: 2014-03-19 | Discharge: 2014-03-20 | Discharge status: Against Medical Advice | Payer: BLUE CROSS/BLUE SHIELD | Attending: Emergency Medicine | Admitting: Emergency Medicine

## 2014-03-19 DIAGNOSIS — Z720 Tobacco use: Secondary | ICD-10-CM | POA: Diagnosis not present

## 2014-03-19 DIAGNOSIS — I10 Essential (primary) hypertension: Secondary | ICD-10-CM | POA: Diagnosis not present

## 2014-03-19 DIAGNOSIS — R110 Nausea: Secondary | ICD-10-CM | POA: Insufficient documentation

## 2014-03-19 DIAGNOSIS — G8929 Other chronic pain: Secondary | ICD-10-CM | POA: Insufficient documentation

## 2014-03-19 DIAGNOSIS — G43909 Migraine, unspecified, not intractable, without status migrainosus: Secondary | ICD-10-CM | POA: Diagnosis not present

## 2014-03-19 NOTE — ED Notes (Signed)
Pt called to back however no response.  Will attempt to call pt again.

## 2014-03-19 NOTE — ED Notes (Signed)
Patient c/o migraine tht hurts behind her eyes. Patient is light sensitive. Patient states she Phenergan earlier for nausea, but threw it up.

## 2014-03-23 ENCOUNTER — Ambulatory Visit: Payer: Managed Care, Other (non HMO) | Admitting: Family Medicine

## 2014-04-12 ENCOUNTER — Other Ambulatory Visit: Payer: Self-pay | Admitting: General Surgery

## 2014-04-12 ENCOUNTER — Other Ambulatory Visit: Payer: Self-pay

## 2014-04-12 DIAGNOSIS — Z9884 Bariatric surgery status: Secondary | ICD-10-CM

## 2014-04-12 DIAGNOSIS — L03311 Cellulitis of abdominal wall: Secondary | ICD-10-CM

## 2014-04-12 DIAGNOSIS — L0390 Cellulitis, unspecified: Secondary | ICD-10-CM

## 2014-04-14 ENCOUNTER — Ambulatory Visit
Admission: RE | Admit: 2014-04-14 | Discharge: 2014-04-14 | Disposition: A | Discharge status: Home / Self Care | Payer: BLUE CROSS/BLUE SHIELD | Source: Ambulatory Visit | Attending: General Surgery | Admitting: General Surgery

## 2014-04-14 MED ORDER — IOPAMIDOL (ISOVUE-300) INJECTION 61%
125.0000 mL | Freq: Once | INTRAVENOUS | Status: AC | PRN
  Administered 2014-04-14: 125 mL via INTRAVENOUS

## 2014-04-17 ENCOUNTER — Other Ambulatory Visit: Payer: Self-pay | Admitting: Surgery

## 2014-04-19 ENCOUNTER — Emergency Department (HOSPITAL_COMMUNITY)
Admission: EM | Admit: 2014-04-19 | Discharge: 2014-04-19 | Disposition: A | Discharge status: Home / Self Care | Payer: BLUE CROSS/BLUE SHIELD | Attending: Emergency Medicine | Admitting: Emergency Medicine

## 2014-04-19 ENCOUNTER — Emergency Department (HOSPITAL_COMMUNITY): Payer: BLUE CROSS/BLUE SHIELD

## 2014-04-19 ENCOUNTER — Encounter (HOSPITAL_COMMUNITY): Payer: Self-pay | Admitting: *Deleted

## 2014-04-19 DIAGNOSIS — Z720 Tobacco use: Secondary | ICD-10-CM | POA: Diagnosis not present

## 2014-04-19 DIAGNOSIS — F329 Major depressive disorder, single episode, unspecified: Secondary | ICD-10-CM | POA: Insufficient documentation

## 2014-04-19 DIAGNOSIS — I10 Essential (primary) hypertension: Secondary | ICD-10-CM | POA: Insufficient documentation

## 2014-04-19 DIAGNOSIS — Z792 Long term (current) use of antibiotics: Secondary | ICD-10-CM | POA: Diagnosis not present

## 2014-04-19 DIAGNOSIS — Z79899 Other long term (current) drug therapy: Secondary | ICD-10-CM | POA: Diagnosis not present

## 2014-04-19 DIAGNOSIS — Z791 Long term (current) use of non-steroidal anti-inflammatories (NSAID): Secondary | ICD-10-CM | POA: Diagnosis not present

## 2014-04-19 DIAGNOSIS — R12 Heartburn: Secondary | ICD-10-CM | POA: Insufficient documentation

## 2014-04-19 DIAGNOSIS — Z8709 Personal history of other diseases of the respiratory system: Secondary | ICD-10-CM | POA: Insufficient documentation

## 2014-04-19 DIAGNOSIS — R079 Chest pain, unspecified: Secondary | ICD-10-CM

## 2014-04-19 DIAGNOSIS — G8929 Other chronic pain: Secondary | ICD-10-CM | POA: Insufficient documentation

## 2014-04-19 DIAGNOSIS — M1990 Unspecified osteoarthritis, unspecified site: Secondary | ICD-10-CM | POA: Diagnosis not present

## 2014-04-19 LAB — COMPREHENSIVE METABOLIC PANEL
ALBUMIN: 3.3 g/dL — AB (ref 3.5–5.2)
ALK PHOS: 54 U/L (ref 39–117)
ALT: 18 U/L (ref 0–35)
AST: 18 U/L (ref 0–37)
Anion gap: 6 (ref 5–15)
BILIRUBIN TOTAL: 0.5 mg/dL (ref 0.3–1.2)
BUN: 15 mg/dL (ref 6–23)
CO2: 23 mmol/L (ref 19–32)
CREATININE: 0.45 mg/dL — AB (ref 0.50–1.10)
Calcium: 7.7 mg/dL — ABNORMAL LOW (ref 8.4–10.5)
Chloride: 109 mmol/L (ref 96–112)
GFR calc Af Amer: >90 mL/min (ref >90)
GFR calc non Af Amer: >90 mL/min (ref >90)
GLUCOSE: 74 mg/dL (ref 70–99)
POTASSIUM: 3.4 mmol/L — AB (ref 3.5–5.1)
Sodium: 138 mmol/L (ref 135–145)
Total Protein: 6.5 g/dL (ref 6.0–8.3)

## 2014-04-19 LAB — I-STAT TROPONIN, ED: TROPONIN I, POC: 0 ng/mL (ref 0.00–0.08)

## 2014-04-19 LAB — CBC
HEMATOCRIT: 36.1 % (ref 36.0–46.0)
HEMOGLOBIN: 11.4 g/dL — AB (ref 12.0–15.0)
MCH: 27.4 pg (ref 26.0–34.0)
MCHC: 31.6 g/dL (ref 30.0–36.0)
MCV: 86.8 fL (ref 78.0–100.0)
Platelets: 242 10*3/uL (ref 150–400)
RBC: 4.16 MIL/uL (ref 3.87–5.11)
RDW: 14.8 % (ref 11.5–15.5)
WBC: 8.8 10*3/uL (ref 4.0–10.5)

## 2014-04-19 MED ORDER — GI COCKTAIL ~~LOC~~
30.0000 mL | Freq: Once | ORAL | Status: AC
  Administered 2014-04-19: 30 mL via ORAL
  Filled 2014-04-19: qty 30

## 2014-04-19 MED ORDER — FAMOTIDINE 20 MG PO TABS: 20.0000 mg | ORAL_TABLET | Freq: Two times a day (BID) | ORAL | Status: DC

## 2014-04-19 NOTE — ED Provider Notes (Signed)
CSN: [id number]     Arrival date & time 04/19/14  1746 History   First MD Initiated Contact with Patient 04/19/14 1757     Chief Complaint  Patient presents with  . Chest Pain  . Post-op Problem     (Consider location/radiation/quality/duration/timing/severity/associated sxs/prior Treatment) HPI Comments: 39 year old female presents today for localized left sided chest pain x 6 days. Pain began Friday 04/14/14 in the evening following a contrast CT. The CT was for evaluation of an infection of her lap band incision from 2014. Patient states the chest pain feels like "gas" and had a similar episode after a Lap band procedure in 2014. Describes pain as a constant pressure that reaches 7/10. Aggravating factors include movement and food, no specific type of food. Symptoms alleviated by Gas X and laying supine. Denies fever, chills, nausea, vomiting, diarrhea, numbness, tingling, weakness or recent illness. Is not currently taking any blood pressure or cholesterol medications. Is currently taking doxycycline for infection of her lap band incision site and is following with Dr. Andrey CampanileWilson. Scheduled to have an endoscopy for the infection to evaluate for erosion. States the area that was red and swollen on her abdomen has significantly improved since starting doxy.  Patient is a 39 y.o. female presenting with chest pain. The history is provided by the patient.  Chest Pain   Past Medical History  Diagnosis Date  . LAP-BAND surgery status   . Hypertension   . Bronchitis   . Arthritis   . Cervical dysplasia   . Depression   . Chronic back pain   . Degenerative disc disease   . Spondylarthrosis   . Migraine    Past Surgical History  Procedure Laterality Date  . Laparoscopic gastric banding  03/2007    [facility]; Dr Jorja Loaim Hipp  . Carpal tunnel release    . Varicose vein surgery    . Cervical cone biopsy    . Laparoscopic revision of gastric band N/A 05/17/2012     Procedure: LAPAROSCOPIC REVISION OF SLIPPED GASTRIC BAND;  Surgeon: Mariella SaaBenjamin T Hoxworth, MD;  Location: [facility];  Service: General;  Laterality: N/A;  . Nephrectomy transplanted organ     No family history on file. History  Substance Use Topics  . Smoking status: Current Every Day Smoker -- 0.50 packs/day    Types: Cigarettes  . Smokeless tobacco: Never Used  . Alcohol Use: No   OB History    No data available     Review of Systems  Cardiovascular: Positive for chest pain.  All other systems reviewed and are negative.     Allergies  Demerol and Monosodium glutamate  Home Medications   Prior to Admission medications   Medication Sig Start Date End Date Taking? Authorizing Provider  albuterol (PROVENTIL) (2.5 MG/3ML) 0.083% nebulizer solution Take 2.5 mg by nebulization every 6 (six) hours as needed for wheezing or shortness of breath.   Yes Historical Provider, MD  clonazePAM (KLONOPIN) 0.5 MG tablet Take 0.5 mg by mouth 2 (two) times daily as needed for anxiety (anxiety).   Yes Historical Provider, MD  diclofenac (VOLTAREN) 75 MG EC tablet Take 75 mg by mouth 2 (two) times daily.   Yes Historical Provider, MD  doxycycline (VIBRA-TABS) 100 MG tablet Take 100 mg by mouth 2 (two) times daily.   Yes Historical Provider, MD  HYDROcodone-acetaminophen (NORCO) 10-325 MG per tablet Take 1 tablet by mouth 4 (four) times daily.   Yes Historical Provider, MD  Multiple  Vitamin (MULITIVITAMIN WITH MINERALS) TABS Take 1 tablet by mouth daily.     Yes Historical Provider, MD  sertraline (ZOLOFT) 50 MG tablet Take 50 mg by mouth daily.   Yes Historical Provider, MD  Simethicone (GAS RELIEF 125 MAX ST PO) Take 250 mg by mouth daily as needed (gas relief).   Yes Historical Provider, MD  albuterol (PROVENTIL HFA;VENTOLIN HFA) 108 (90 BASE) MCG/ACT inhaler Inhale 2 puffs into the lungs every 4 (four) hours as needed for wheezing or shortness of breath (cough). 12/15/10 08/25/13  Felisa Bonier, MD   famotidine (PEPCID) 20 MG tablet Take 1 tablet (20 mg total) by mouth 2 (two) times daily. 04/19/14   Tyrianna Lightle M Tanisha Lutes, PA-C   BP 140/61 mmHg  Pulse 74  Temp(Src) 98.8 F (37.1 C) (Oral)  Resp 14  SpO2 96%  LMP 03/16/2014 Physical Exam  Constitutional: She is oriented to person, place, and time. She appears well-developed and well-nourished. No distress.  HENT:  Head: Normocephalic and atraumatic.  Mouth/Throat: Oropharynx is clear and moist.  Eyes: Conjunctivae and EOM are normal. Pupils are equal, round, and reactive to light.  Neck: Normal range of motion. Neck supple. No JVD present.  Cardiovascular: Normal rate, regular rhythm, normal heart sounds and intact distal pulses.   No extremity edema.  Pulmonary/Chest: Effort normal and breath sounds normal. No respiratory distress. She exhibits no tenderness.  Abdominal: Soft. Bowel sounds are normal.    Musculoskeletal: Normal range of motion. She exhibits no edema.  Neurological: She is alert and oriented to person, place, and time. She has normal strength. No sensory deficit.  Speech fluent, goal oriented. Moves limbs without ataxia. Equal grip strength bilateral.  Skin: Skin is warm and dry. She is not diaphoretic.  Psychiatric: She has a normal mood and affect. Her behavior is normal.  Nursing note and vitals reviewed.   ED Course  Procedures (including critical care time) Labs Review Labs Reviewed  CBC - Abnormal; Notable for the following:    Hemoglobin 11.4 (*)    All other components within normal limits  COMPREHENSIVE METABOLIC PANEL - Abnormal; Notable for the following:    Potassium 3.4 (*)    Creatinine, Ser 0.45 (*)    Calcium 7.7 (*)    Albumin 3.3 (*)    All other components within normal limits  Rosezena Sensor, ED    Imaging Review Dg Chest 2 View  04/19/2014   CLINICAL DATA:  Postoperative chest pain since Friday.  EXAM: CHEST  2 VIEW  COMPARISON:  11/25/2010  FINDINGS: The heart size and mediastinal  contours are within normal limits. Both lungs are clear. The visualized skeletal structures are unremarkable.  IMPRESSION: No acute cardiopulmonary findings.   Electronically Signed   By: Rudie Meyer M.D.   On: 04/19/2014 19:12     EKG Interpretation   Date/Time:  Wednesday April 19 2014 18:23:19 EDT Ventricular Rate:  68 PR Interval:  157 QRS Duration: 98 QT Interval:  400 QTC Calculation: 425 R Axis:   68 Text Interpretation:  Sinus rhythm Atrial premature complex Confirmed by  HARRISON  MD, FORREST ([id number]) on 04/19/2014 6:27:13 PM      MDM   Final diagnoses:  Chest pain, unspecified chest pain type  Heartburn   Nontoxic appearing, NAD. AF VSS. Workup negative. Most likely heartburn after drinking contrast material. Symptoms completely subsided after receiving GI cocktail. Doubt cardiac. Doubt PE. Advised follow-up with PCP and general surgery as scheduled. Will discharge home on Pepcid.  Guarding recent diagnosis of postop infection, she showed me pictures of the cellulitis prior to antibiotics, and is significantly improved on exam. Minimally tender over the area of bruising, no other abdominal tenderness. Stable for discharge. Return precautions given. Patient states understanding of treatment care plan and is agreeable.   Kathrynn Speed, PA-C 04/19/14 1938  Kathrynn Speed, PA-C 04/19/14 1939  Kathrynn Speed, PA-C 04/19/14 1940  Purvis Sheffield, MD 04/20/14 0030

## 2014-04-19 NOTE — Discharge Instructions (Signed)
Take pepcid as prescribed.  Heartburn Heartburn is a painful, burning sensation in the chest. It may feel worse in certain positions, such as lying down or bending over. It is caused by stomach acid backing up into the tube that carries food from the mouth down to the stomach (lower esophagus).  CAUSES   Large meals.  Certain foods and drinks.  Exercise.  Increased acid production.  Being overweight or obese.  Certain medicines. SYMPTOMS   Burning pain in the chest or lower throat.  Bitter taste in the mouth.  Coughing. DIAGNOSIS  If the usual treatments for heartburn do not improve your symptoms, then tests may be done to see if there is another condition present. Possible tests may include:  X-rays.  Endoscopy. This is when a tube with a light and a camera on the end is used to examine the esophagus and the stomach.  A test to measure the amount of acid in the esophagus (pH test).  A test to see if the esophagus is working properly (esophageal manometry).  Blood, breath, or stool tests to check for bacteria that cause ulcers. TREATMENT   Your caregiver may tell you to use certain over-the-counter medicines (antacids, acid reducers) for mild heartburn.  Your caregiver may prescribe medicines to decrease the acid in your stomach or protect your stomach lining.  Your caregiver may recommend certain diet changes.  For severe cases, your caregiver may recommend that the head of your bed be elevated on blocks. (Sleeping with more pillows is not an effective treatment as it only changes the position of your head and does not improve the main problem of stomach acid refluxing into the esophagus.) HOME CARE INSTRUCTIONS   Take all medicines as directed by your caregiver.  Raise the head of your bed by putting blocks under the legs if instructed to by your caregiver.  Do not exercise right after eating.  Avoid eating 2 or 3 hours before bed. Do not lie down right after  eating.  Eat small meals throughout the day instead of 3 large meals.  Stop smoking if you smoke.  Maintain a healthy weight.  Identify foods and beverages that make your symptoms worse and avoid them. Foods you may want to avoid include:  Peppers.  Chocolate.  High-fat foods, including fried foods.  Spicy foods.  Garlic and onions.  Citrus fruits, including oranges, grapefruit, lemons, and limes.  Food containing tomatoes or tomato products.  Mint.  Carbonated drinks, caffeinated drinks, and alcohol.  Vinegar. SEEK IMMEDIATE MEDICAL CARE IF:  You have severe chest pain that goes down your arm or into your jaw or neck.  You feel sweaty, dizzy, or lightheaded.  You are short of breath.  You vomit blood.  You have difficulty or pain with swallowing.  You have bloody or black, tarry stools.  You have episodes of heartburn more than 3 times a week for more than 2 weeks. MAKE SURE YOU:  Understand these instructions.  Will watch your condition.  Will get help right away if you are not doing well or get worse. Document Released: 05/11/2008 Document Revised: 03/17/2011 Document Reviewed: 06/09/2010 [facility] Patient Information 2015 [place], [place]. This information is not intended to replace advice given to you by your health care provider. Make sure you discuss any questions you have with your health care provider.  Chest Pain (Nonspecific) It is often hard to give a specific diagnosis for the cause of chest pain. There is always a chance that your  pain could be related to something serious, such as a heart attack or a blood clot in the lungs. You need to follow up with your health care provider for further evaluation. CAUSES   Heartburn.  Pneumonia or bronchitis.  Anxiety or stress.  Inflammation around your heart (pericarditis) or lung (pleuritis or pleurisy).  A blood clot in the lung.  A collapsed lung (pneumothorax). It can develop suddenly on its  own (spontaneous pneumothorax) or from trauma to the chest.  Shingles infection (herpes zoster virus). The chest wall is composed of bones, muscles, and cartilage. Any of these can be the source of the pain.  The bones can be bruised by injury.  The muscles or cartilage can be strained by coughing or overwork.  The cartilage can be affected by inflammation and become sore (costochondritis). DIAGNOSIS  Lab tests or other studies may be needed to find the cause of your pain. Your health care provider may have you take a test called an ambulatory electrocardiogram (ECG). An ECG records your heartbeat patterns over a 24-hour period. You may also have other tests, such as:  Transthoracic echocardiogram (TTE). During echocardiography, sound waves are used to evaluate how blood flows through your heart.  Transesophageal echocardiogram (TEE).  Cardiac monitoring. This allows your health care provider to monitor your heart rate and rhythm in real time.  Holter monitor. This is a portable device that records your heartbeat and can help diagnose heart arrhythmias. It allows your health care provider to track your heart activity for several days, if needed.  Stress tests by exercise or by giving medicine that makes the heart beat faster. TREATMENT   Treatment depends on what may be causing your chest pain. Treatment may include:  Acid blockers for heartburn.  Anti-inflammatory medicine.  Pain medicine for inflammatory conditions.  Antibiotics if an infection is present.  You may be advised to change lifestyle habits. This includes stopping smoking and avoiding alcohol, caffeine, and chocolate.  You may be advised to keep your head raised (elevated) when sleeping. This reduces the chance of acid going backward from your stomach into your esophagus. Most of the time, nonspecific chest pain will improve within 2-3 days with rest and mild pain medicine.  HOME CARE INSTRUCTIONS   If  antibiotics were prescribed, take them as directed. Finish them even if you start to feel better.  For the next few days, avoid physical activities that bring on chest pain. Continue physical activities as directed.  Do not use any tobacco products, including cigarettes, chewing tobacco, or electronic cigarettes.  Avoid drinking alcohol.  Only take medicine as directed by your health care provider.  Follow your health care provider's suggestions for further testing if your chest pain does not go away.  Keep any follow-up appointments you made. If you do not go to an appointment, you could develop lasting (chronic) problems with pain. If there is any problem keeping an appointment, call to reschedule. SEEK MEDICAL CARE IF:   Your chest pain does not go away, even after treatment.  You have a rash with blisters on your chest.  You have a fever. SEEK IMMEDIATE MEDICAL CARE IF:   You have increased chest pain or pain that spreads to your arm, neck, jaw, back, or abdomen.  You have shortness of breath.  You have an increasing cough, or you cough up blood.  You have severe back or abdominal pain.  You feel nauseous or vomit.  You have severe weakness.  You faint.  You have chills. This is an emergency. Do not wait to see if the pain will go away. Get medical help at once. Call your local emergency services (911 in [place] Do not drive yourself to the hospital. MAKE SURE YOU:   Understand these instructions.  Will watch your condition.  Will get help right away if you are not doing well or get worse. Document Released: 10/02/2004 Document Revised: 12/28/2012 Document Reviewed: 07/29/2007 [facility] Patient Information 2015 [place], [place]. This information is not intended to replace advice given to you by your health care provider. Make sure you discuss any questions you have with your health care provider.

## 2014-04-19 NOTE — ED Notes (Signed)
Bed: [id number] Expected date:  Expected time:  Means of arrival:  Comments: Recent lap band surgery, ABD pain

## 2014-04-19 NOTE — ED Notes (Signed)
Per EMS, pt was sent from her work clinic after she stated she had "gas pressure" in her chest. Pt states she had similar pain on Friday, which was relieved by Gas-x. Pt states she is more concerned about her lap-band being infected. Pt was put on doxycyline on Friday for the infection, which she states has not improved. Pt has redness on her LLQ and a bruise near her umbilicus. Pt states she feels her lap band port has moved.Pt was given 324mg  aspirin at clinic.

## 2014-04-26 ENCOUNTER — Other Ambulatory Visit: Payer: Self-pay | Admitting: Surgery

## 2014-04-26 NOTE — H&P (Signed)
Samantha Doyle 04/12/2014 10:31 AM Location: [facility] Patient #: [id number] DOB: 1975/08/11 Single / Language: [place] / Race: White Female  History of Present Illness  Patient words: lap band.   The patient is a 39 year old female is here for LapBand followup. She underwent laparoscopic adjustable gastric band surgery in 2009 in [place]. She underwent revision of her lap band by Dr. Johna Sheriff in May 2014 due to a slip. We last saw her in the office on August 25, 2013.    Her weight at that time was 244 pounds. She had a small fill at that time. She states that she had been doing well until last Wednesday when she noticed some swelling and redness around her supraumbilical incision were port is. She believes her port has migrated in location. She states that the redness on her scan has worsened since last week. It is now progressing. She had from [place] on her abdominal wall for the redness had stopped and it has extended past that. She denies any fevers or chills.    She denies any nausea or vomiting. She denies any regurgitation. She denies any reflux or nighttime cough. She states that she is exercising and going to the gym 3 times a week. She is also walking for 30 minutes each morning. She reports good fo food choices. She does work 12 hour shifts. She denies any trauma to her abdominal wall. She denies any pain with eating.   Other Problems Lamar Laundry Bynum, CMA; 04/12/2014 10:31 AM) Anxiety Disorder Arthritis Back Pain Depression Diabetes Mellitus Gastroesophageal Reflux Disease High blood pressure Hypercholesterolemia Sleep Apnea  Past Surgical History Gilmer Mor, CMA; 04/12/2014 10:31 AM) Lap Band  Diagnostic Studies History Gilmer Mor, CMA; 04/12/2014 10:31 AM) Colonoscopy never Mammogram 1-3 years ago Pap Smear 1-5 years ago  Allergies Lamar Laundry Bynum, CMA; 04/12/2014 10:32 AM) Demerol *ANALGESICS - OPIOID* Hives, Nausea,  Vomiting. MONOSODIUM GLUTAMATE Hives.  Medication History Gilmer Mor, CMA; 04/12/2014 10:34 AM) ClonazePAM (  Tablet, Oral as needed) Active. Cyanocobalamin (1000MCG/ML Solution, Injection monthly) Active. Multivitamin (Oral daily) Active. Voltaren (  Tablet DR, Oral as needed) Active. Zoloft (  Tablet, Oral) Active. Lortab (10-325MG  Tablet, Oral four times daily) Active. Medications Reconciled  Social History Gilmer Mor, CMA; 04/12/2014 10:31 AM) Alcohol use Occasional alcohol use. Caffeine use Coffee. No drug use Tobacco use Current every day smoker.  Family History Gilmer Mor, CMA; 04/12/2014 10:31 AM) Alcohol Abuse Father, Mother, Sister. Arthritis Family Members In General, Father, Mother. Breast Cancer Family Members In General. Cervical Cancer Family Members In General. Colon Cancer Family Members In General. Colon Polyps Father, Mother. Depression Father, Mother. Diabetes Mellitus Family Members In General, Father. Heart Disease Family Members In General, Father. Heart disease in female family member before age 56 Heart disease in female family member before age 44 Hypertension Family Members In General, Father, Mother. Melanoma Father, Mother, Sister. Migraine Headache Family Members In General. Respiratory Condition Family Members In General, Father, Mother.  Pregnancy / Birth History Gilmer Mor, CMA; 04/12/2014 10:31 AM) Age at menarche 12 years. Contraceptive History Oral contraceptives. Gravida 0 Para 0 Regular periods  Review of Systems (Sonya Bynum CMA; 04/12/2014 10:32 AM) General Not Present- Appetite Loss, Chills, Fatigue, Fever, Night Sweats, Weight Gain and Weight Loss. Skin Not Present- Change in Wart/Mole, Dryness, Hives, Jaundice, New Lesions, Non-Healing Wounds, Rash and Ulcer. HEENT Present- Wears glasses/contact lenses. Not Present- Earache, Hearing Loss, Hoarseness, Nose Bleed, Oral Ulcers, Ringing in the  Ears, Seasonal Allergies, Sinus Pain, Sore  Throat, Visual Disturbances and Yellow Eyes.  Respiratory Not Present- Bloody sputum, Chronic Cough, Difficulty Breathing, Snoring and Wheezing. Breast Not Present- Breast Mass, Breast Pain, Nipple Discharge and Skin Changes. Cardiovascular Not Present- Chest Pain, Difficulty Breathing Lying Down, Leg Cramps, Palpitations, Rapid Heart Rate, Shortness of Breath and Swelling of Extremities. Gastrointestinal Present- Abdominal Pain. Not Present- Bloating, Bloody Stool, Change in Bowel Habits, Chronic diarrhea, Constipation, Difficulty Swallowing, Excessive gas, Gets full quickly at meals, Hemorrhoids, Indigestion, Nausea, Rectal Pain and Vomiting. Female Genitourinary Present- Frequency. Not Present- Nocturia, Painful Urination, Pelvic Pain and Urgency. Musculoskeletal Present- Back Pain, Joint Pain and Swelling of Extremities. Not Present- Joint Stiffness, Muscle Pain and Muscle Weakness. Neurological Present- Headaches, Numbness and Weakness. Not Present- Decreased Memory, Fainting, Seizures, Tingling, Tremor and Trouble walking. Psychiatric Present- Anxiety, Change in Sleep Pattern and Depression. Not Present- Bipolar, Fearful and Frequent crying. Endocrine Present- Excessive Hunger. Not Present- Cold Intolerance, Hair Changes, Heat Intolerance, Hot flashes and New Diabetes. Hematology Not Present- Easy Bruising, Excessive bleeding, Gland problems, HIV and Persistent Infections.   Vitals (Sonya Bynum CMA; 04/12/2014 10:32 AM) 04/12/2014 10:32 AM Weight: 258 lb Height: 63in Body Surface Area: 2.28 m Body Mass Index: 45.7 kg/m Temp.: 97.42F(Temporal)  Pulse: 78 (Regular)  BP: 138/82 (Sitting, Left Arm, Standard)  Physical Exam  General Appearance-Consistent with stated age.  Morbid obesity; nontoxic, not ill appearing  Head and Neck Head-normocephalic, atraumatic with no lesions or palpable masses.  Chest and Lung Exam Chest and lung  exam reveals -quiet, even and easy respiratory effort with no use of accessory muscles and on auscultation, normal breath sounds, no adventitious sounds and normal vocal resonance.  Breast - Did not examine.  Cardiovascular examination reveals -normal heart sounds, regular rate and rhythm with no murmurs and normal pedal pulses bilaterally.  Abdomen Inspection of the abdomen reveals - No Hernias. Skin - Scar - Note: well healed surgical scars. Palpation and Percussion of the abdomen reveal - Soft, Non Tender, No Rebound tenderness, No Rigidity (guarding) and No hepatosplenomegaly. Auscultation of the abdomen reveals - Bowel sounds normal.  Note: small supraumbilical transverse scar with central bruise and some TTP, possible port near skin surface. has surrounding, extending cellulitis measuring 18cm across by 9 cm vertical  Neurologic Neurologic evaluation reveals -alert and oriented x 3 with no impairment of recent or remote memory.  Musculoskeletal Normal Exam - Left-Upper Extremity Strength Normal and Lower Extremity Strength Normal. Normal Exam - Right-Upper Extremity Strength Normal and Lower Extremity Strength Normal.  Assessment & Plan: 1.  CELLULITIS OF ABDOMINAL WALL (682.2  L03.311) Impression:   We will get a CT scan to look at band and port position to evaluate you for a band erosion or port problem.   Started Doxycycline Hyclate 100MG , 1 (one) Tablet two times daily, #14, 04/12/2014, No Refill.  2.  LAP-BAND SURGERY STATUS (V45.86  Z98.84)  3.  OBESITY, MORBID, BMI 40.0-49.9 (278.01  E66.01)  Impression: Unfortunately she has gained some additional weight. In June 2014 her weight was 170 pounds. However she is still down about 90 pounds since her initial surgery in 2009. She states that there is a significant problem with her band and she is interested in conversion to sleeve gastrectomy. I explained that we first need to delineate what's going on with the  cellulitis  [she was seen in our office by Dr. Andrey CampanileWilson on 04/12/2014]  Ovidio Kinavid Lola Lofaro, MD, [facility] Pager: [phone] Office phone:  [phone]

## 2014-04-27 ENCOUNTER — Ambulatory Visit (HOSPITAL_COMMUNITY)
Admission: RE | Admit: 2014-04-27 | Discharge: 2014-04-27 | Disposition: A | Discharge status: Home / Self Care | Payer: BLUE CROSS/BLUE SHIELD | Source: Ambulatory Visit | Attending: Surgery | Admitting: Surgery

## 2014-04-27 ENCOUNTER — Encounter (HOSPITAL_COMMUNITY)
Admission: RE | Disposition: A | Discharge status: Home / Self Care | Payer: Self-pay | Source: Ambulatory Visit | Attending: Surgery

## 2014-04-27 ENCOUNTER — Encounter (HOSPITAL_COMMUNITY): Payer: Self-pay

## 2014-04-27 DIAGNOSIS — Z6841 Body Mass Index (BMI) 40.0 and over, adult: Secondary | ICD-10-CM | POA: Diagnosis not present

## 2014-04-27 DIAGNOSIS — F329 Major depressive disorder, single episode, unspecified: Secondary | ICD-10-CM | POA: Diagnosis not present

## 2014-04-27 DIAGNOSIS — K9501 Infection due to gastric band procedure: Secondary | ICD-10-CM | POA: Diagnosis not present

## 2014-04-27 DIAGNOSIS — Z9884 Bariatric surgery status: Secondary | ICD-10-CM | POA: Insufficient documentation

## 2014-04-27 DIAGNOSIS — E780 Pure hypercholesterolemia: Secondary | ICD-10-CM | POA: Insufficient documentation

## 2014-04-27 DIAGNOSIS — F419 Anxiety disorder, unspecified: Secondary | ICD-10-CM | POA: Insufficient documentation

## 2014-04-27 DIAGNOSIS — I10 Essential (primary) hypertension: Secondary | ICD-10-CM | POA: Diagnosis not present

## 2014-04-27 DIAGNOSIS — E119 Type 2 diabetes mellitus without complications: Secondary | ICD-10-CM | POA: Diagnosis not present

## 2014-04-27 DIAGNOSIS — F17210 Nicotine dependence, cigarettes, uncomplicated: Secondary | ICD-10-CM | POA: Diagnosis not present

## 2014-04-27 DIAGNOSIS — Y848 Other medical procedures as the cause of abnormal reaction of the patient, or of later complication, without mention of misadventure at the time of the procedure: Secondary | ICD-10-CM | POA: Diagnosis not present

## 2014-04-27 DIAGNOSIS — K219 Gastro-esophageal reflux disease without esophagitis: Secondary | ICD-10-CM | POA: Insufficient documentation

## 2014-04-27 HISTORY — PX: ESOPHAGOGASTRODUODENOSCOPY: SHX5428

## 2014-04-27 SURGERY — EGD (ESOPHAGOGASTRODUODENOSCOPY)
Anesthesia: Moderate Sedation

## 2014-04-27 MED ORDER — BUTAMBEN-TETRACAINE-BENZOCAINE 2-2-14 % EX AERO
INHALATION_SPRAY | CUTANEOUS | Status: DC | PRN
  Administered 2014-04-27: 2 via TOPICAL

## 2014-04-27 MED ORDER — FENTANYL CITRATE (PF) 100 MCG/2ML IJ SOLN
INTRAMUSCULAR | Status: DC | PRN
  Administered 2014-04-27 (×3): 25 ug via INTRAVENOUS

## 2014-04-27 MED ORDER — MIDAZOLAM HCL 10 MG/2ML IJ SOLN
INTRAMUSCULAR | Status: AC
  Filled 2014-04-27: qty 2

## 2014-04-27 MED ORDER — FENTANYL CITRATE (PF) 100 MCG/2ML IJ SOLN
INTRAMUSCULAR | Status: AC
  Filled 2014-04-27: qty 2

## 2014-04-27 MED ORDER — MIDAZOLAM HCL 10 MG/2ML IJ SOLN
INTRAMUSCULAR | Status: DC | PRN
  Administered 2014-04-27 (×3): 2 mg via INTRAVENOUS
  Administered 2014-04-27: 1 mg via INTRAVENOUS

## 2014-04-27 MED ORDER — SULFAMETHOXAZOLE-TRIMETHOPRIM 800-160 MG PO TABS: 1.0000 | ORAL_TABLET | Freq: Two times a day (BID) | ORAL | Status: DC

## 2014-04-27 MED ORDER — SODIUM CHLORIDE 0.9 % IV SOLN
INTRAVENOUS | Status: DC
  Administered 2014-04-27: 500 mL via INTRAVENOUS

## 2014-04-27 NOTE — Interval H&P Note (Signed)
History and Physical Interval Note:  04/27/2014 7:25 AM  Samantha MayansHeather A Doyle  has presented today for surgery, with the diagnosis of lap band with inflammation  The various methods of treatment have been discussed with the patient and family.   She has a friend, Rachita, with her.  After consideration of risks, benefits and other options for treatment, the patient has consented to  Procedure(s): ESOPHAGOGASTRODUODENOSCOPY (EGD) (N/A) as a surgical intervention .  The patient's history has been reviewed, patient examined, no change in status, stable for surgery.  I have reviewed the patient's chart and labs.  Questions were answered to the patient's satisfaction.     [name] H

## 2014-04-27 NOTE — Op Note (Signed)
04/27/2014  8:16 AM  PATIENT:  Samantha MayansHeather A Doyle, 39 y.o., female, MRN: [id number]  PREOP DIAGNOSIS:  Port infection  POSTOP DIAGNOSIS:   Port infection, no evidence of lap band erosion, normal esophagus, stomach, duodenum  PROCEDURE:  Esophagogastroduedonoscopy  SURGEON:   Ovidio Kinavid Crucita Lacorte, M.D.  ANESTHESIA:   Fentanyl  75 mcg   Versed 7 mg  INDICATIONS FOR PROCEDURE:  Samantha MayansHeather A Doyle is a 39 y.o. (DOB: 08/12/1975)  white  female whose primary care physician is PROVIDER NOT IN SYSTEM and comes for upper endoscopy to evaluate possible lap band erosion.   [place] in 2009.  She had a lap band slip in 2014 which required a revision by Dr. Leonard SchwartzB. Hoxworth on 05/17/2012.  She has recently noticed redness and drainage from her port site.  She was placed on doxycycline, but is not tolerating this well.   Of note, she does smoke 1/2 ppd of cigarettes and she is on diclofenac, both risk factors for an erosion.   The indications and risks of the endoscopy were explained to the patient.  The risks include, but are not limited to, perforation, bleeding, or injury to the bowel.  I am doing the upper endo to rule out an erosion.  PROCEDURE:  The patient was monitored with a pulse oximetry, BP cuff, and EKG.  The patient has nasal O2 flowing during the procedure.   The back of the throat was anesthestized with Ceticaine.  A flexible Pentax endoscope was passed down the throat without difficulty.  Findings include:   Esophagus:   Normal   GE junction at:  35 cm   Lap band:  Normal imprint of lap band.  I see no evidence of erosion.   Stomach: Normal   Duodenum:   Normal  PLAN:  Follow up with Dr. Johna SheriffHoxworth next week to manage the lap band port.      I have placed her on Septra DS until then.  I wrote her a note to be out of work.  Her friend, [name], is with her and I went over the findings and plan with her.  Ovidio Kinavid Wilba Mutz, MD, [facility] Pager:  [phone] Office phone:  [phone]

## 2014-04-27 NOTE — Discharge Instructions (Signed)
Esophagogastroduodenoscopy °Care After °Refer to this sheet in the next few weeks. These instructions provide you with information on caring for yourself after your procedure. Your caregiver may also give you more specific instructions. Your treatment has been planned according to current medical practices, but problems sometimes occur. Call your caregiver if you have any problems or questions after your procedure.  °HOME CARE INSTRUCTIONS °· Do not eat or drink anything until the numbing medicine (local anesthetic) has worn off and your gag reflex has returned. You will know that the local anesthetic has worn off when you can swallow comfortably. °· Do not drive for 12 hours after the procedure or as directed by your caregiver. °· Only take medicines as directed by your caregiver. °SEEK MEDICAL CARE IF:  °· You cannot stop coughing. °· You are not urinating at all or less than usual. °SEEK IMMEDIATE MEDICAL CARE IF: °· You have difficulty swallowing. °· You cannot eat or drink. °· You have worsening throat or chest pain. °· You have dizziness, lightheadedness, or you faint. °· You have nausea or vomiting. °· You have chills. °· You have a fever. °· You have severe abdominal pain. °· You have black, tarry, or bloody stools. °Document Released: 12/10/2011 Document Reviewed: 12/10/2011 °[employer]® Patient Information ©2015 [employer]. This information is not intended to replace advice given to you by your health care provider. Make sure you discuss any questions you have with your health care provider. ° °

## 2014-04-28 ENCOUNTER — Encounter (HOSPITAL_COMMUNITY): Payer: Self-pay | Admitting: Surgery

## 2014-04-29 ENCOUNTER — Emergency Department (HOSPITAL_COMMUNITY): Payer: BLUE CROSS/BLUE SHIELD

## 2014-04-29 ENCOUNTER — Inpatient Hospital Stay (HOSPITAL_COMMUNITY)
Admission: EM | Admit: 2014-04-29 | Discharge: 2014-05-01 | DRG: 357 | Disposition: A | Discharge status: Home / Self Care | Payer: BLUE CROSS/BLUE SHIELD | Attending: General Surgery | Admitting: General Surgery

## 2014-04-29 ENCOUNTER — Encounter (HOSPITAL_COMMUNITY): Payer: Self-pay | Admitting: Emergency Medicine

## 2014-04-29 DIAGNOSIS — R229 Localized swelling, mass and lump, unspecified: Secondary | ICD-10-CM | POA: Diagnosis not present

## 2014-04-29 DIAGNOSIS — T368X5A Adverse effect of other systemic antibiotics, initial encounter: Secondary | ICD-10-CM | POA: Diagnosis not present

## 2014-04-29 DIAGNOSIS — Z87410 Personal history of cervical dysplasia: Secondary | ICD-10-CM

## 2014-04-29 DIAGNOSIS — L03311 Cellulitis of abdominal wall: Secondary | ICD-10-CM | POA: Diagnosis present

## 2014-04-29 DIAGNOSIS — K9501 Infection due to gastric band procedure: Secondary | ICD-10-CM | POA: Diagnosis not present

## 2014-04-29 DIAGNOSIS — G8929 Other chronic pain: Secondary | ICD-10-CM | POA: Diagnosis present

## 2014-04-29 DIAGNOSIS — Z881 Allergy status to other antibiotic agents status: Secondary | ICD-10-CM

## 2014-04-29 DIAGNOSIS — F329 Major depressive disorder, single episode, unspecified: Secondary | ICD-10-CM | POA: Diagnosis present

## 2014-04-29 DIAGNOSIS — Z6841 Body Mass Index (BMI) 40.0 and over, adult: Secondary | ICD-10-CM

## 2014-04-29 DIAGNOSIS — Z87891 Personal history of nicotine dependence: Secondary | ICD-10-CM

## 2014-04-29 DIAGNOSIS — Z888 Allergy status to other drugs, medicaments and biological substances status: Secondary | ICD-10-CM

## 2014-04-29 DIAGNOSIS — L270 Generalized skin eruption due to drugs and medicaments taken internally: Secondary | ICD-10-CM | POA: Diagnosis not present

## 2014-04-29 DIAGNOSIS — M1990 Unspecified osteoarthritis, unspecified site: Secondary | ICD-10-CM | POA: Diagnosis present

## 2014-04-29 DIAGNOSIS — L02211 Cutaneous abscess of abdominal wall: Secondary | ICD-10-CM | POA: Diagnosis present

## 2014-04-29 DIAGNOSIS — Z885 Allergy status to narcotic agent status: Secondary | ICD-10-CM

## 2014-04-29 DIAGNOSIS — I10 Essential (primary) hypertension: Secondary | ICD-10-CM | POA: Diagnosis present

## 2014-04-29 DIAGNOSIS — K219 Gastro-esophageal reflux disease without esophagitis: Secondary | ICD-10-CM | POA: Diagnosis present

## 2014-04-29 DIAGNOSIS — M549 Dorsalgia, unspecified: Secondary | ICD-10-CM | POA: Diagnosis present

## 2014-04-29 LAB — CBC WITH DIFFERENTIAL/PLATELET
BASOS ABS: 0 10*3/uL (ref 0.0–0.1)
Basophils Relative: 0 % (ref 0–1)
Eosinophils Absolute: 0.2 10*3/uL (ref 0.0–0.7)
Eosinophils Relative: 2 % (ref 0–5)
HCT: 36.6 % (ref 36.0–46.0)
Hemoglobin: 11.8 g/dL — ABNORMAL LOW (ref 12.0–15.0)
LYMPHS ABS: 1.9 10*3/uL (ref 0.7–4.0)
LYMPHS PCT: 25 % (ref 12–46)
MCH: 27.9 pg (ref 26.0–34.0)
MCHC: 32.2 g/dL (ref 30.0–36.0)
MCV: 86.5 fL (ref 78.0–100.0)
Monocytes Absolute: 0.5 10*3/uL (ref 0.1–1.0)
Monocytes Relative: 6 % (ref 3–12)
NEUTROS ABS: 4.9 10*3/uL (ref 1.7–7.7)
NEUTROS PCT: 67 % (ref 43–77)
PLATELETS: 191 10*3/uL (ref 150–400)
RBC: 4.23 MIL/uL (ref 3.87–5.11)
RDW: 14.6 % (ref 11.5–15.5)
WBC: 7.4 10*3/uL (ref 4.0–10.5)

## 2014-04-29 MED ORDER — ALBUTEROL SULFATE (2.5 MG/3ML) 0.083% IN NEBU: 2.5000 mg | INHALATION_SOLUTION | Freq: Four times a day (QID) | RESPIRATORY_TRACT | Status: DC | PRN

## 2014-04-29 MED ORDER — DIPHENHYDRAMINE HCL 12.5 MG/5ML PO ELIX: 12.5000 mg | ORAL_SOLUTION | Freq: Four times a day (QID) | ORAL | Status: DC | PRN

## 2014-04-29 MED ORDER — OXYCODONE HCL 5 MG PO TABS
5.0000 mg | ORAL_TABLET | ORAL | Status: DC | PRN
Start: 2014-04-29 — End: 2014-04-30
  Administered 2014-04-29: 5 mg via ORAL
  Filled 2014-04-29: qty 1

## 2014-04-29 MED ORDER — PANTOPRAZOLE SODIUM 40 MG IV SOLR
40.0000 mg | Freq: Every day | INTRAVENOUS | Status: DC
  Administered 2014-04-30 (×2): 40 mg via INTRAVENOUS
  Filled 2014-04-29 (×3): qty 40

## 2014-04-29 MED ORDER — SERTRALINE HCL 50 MG PO TABS
50.0000 mg | ORAL_TABLET | Freq: Every day | ORAL | Status: DC
  Administered 2014-05-01: 50 mg via ORAL
  Filled 2014-04-29 (×2): qty 1

## 2014-04-29 MED ORDER — CLONAZEPAM 0.5 MG PO TABS: 0.5000 mg | ORAL_TABLET | Freq: Three times a day (TID) | ORAL | Status: DC | PRN

## 2014-04-29 MED ORDER — MORPHINE SULFATE 2 MG/ML IJ SOLN
1.0000 mg | INTRAMUSCULAR | Status: DC | PRN
  Administered 2014-04-30 (×3): 2 mg via INTRAVENOUS
  Filled 2014-04-29 (×3): qty 1

## 2014-04-29 MED ORDER — HEPARIN SODIUM (PORCINE) 5000 UNIT/ML IJ SOLN
5000.0000 [IU] | Freq: Three times a day (TID) | INTRAMUSCULAR | Status: DC
  Filled 2014-04-29 (×5): qty 1

## 2014-04-29 MED ORDER — PROMETHAZINE HCL 25 MG/ML IJ SOLN: 12.5000 mg | Freq: Four times a day (QID) | INTRAMUSCULAR | Status: DC | PRN

## 2014-04-29 MED ORDER — ONDANSETRON HCL 4 MG/2ML IJ SOLN
4.0000 mg | Freq: Four times a day (QID) | INTRAMUSCULAR | Status: DC | PRN
Start: 2014-04-29 — End: 2014-05-01

## 2014-04-29 MED ORDER — KCL IN DEXTROSE-NACL 20-5-0.45 MEQ/L-%-% IV SOLN
INTRAVENOUS | Status: DC
  Administered 2014-04-30 – 2014-05-01 (×3): via INTRAVENOUS
  Filled 2014-04-29 (×5): qty 1000

## 2014-04-29 MED ORDER — VANCOMYCIN HCL 10 G IV SOLR
2500.0000 mg | Freq: Once | INTRAVENOUS | Status: AC
  Administered 2014-04-30: 2500 mg via INTRAVENOUS
  Filled 2014-04-29: qty 2500

## 2014-04-29 MED ORDER — DIPHENHYDRAMINE HCL 50 MG/ML IJ SOLN
12.5000 mg | Freq: Four times a day (QID) | INTRAMUSCULAR | Status: DC | PRN
  Administered 2014-04-30: 25 mg via INTRAVENOUS
  Filled 2014-04-29: qty 1

## 2014-04-29 NOTE — ED Provider Notes (Signed)
CSN: [id number]     Arrival date & time 04/29/14  1826 History   First MD Initiated Contact with Patient 04/29/14 1911     Chief Complaint  Patient presents with  . Lap Band site oozing    . Arm Pain     (Consider location/radiation/quality/duration/timing/severity/associated sxs/prior Treatment) HPI Comments: Patient here complaining of drainage from a prior surgical site. Patient had a lap band placed and has a port at that area. No fever or chills. No vomiting noted. No abdominal pain. Recently treated for cellulitis with doxycycline as well as was to Bactrim. Had EGD done 2 days ago which showed a possible infection. Symptoms persistent and nothing makes them better worse.  Patient is a 39 y.o. female presenting with arm pain. The history is provided by the patient.  Arm Pain    Past Medical History  Diagnosis Date  . LAP-BAND surgery status   . Hypertension   . Bronchitis   . Arthritis   . Cervical dysplasia   . Depression   . Chronic back pain   . Degenerative disc disease   . Spondylarthrosis   . Migraine    Past Surgical History  Procedure Laterality Date  . Laparoscopic gastric banding  03/2007    [facility]; Dr Jorja Loaim Hipp  . Carpal tunnel release    . Varicose vein surgery    . Cervical cone biopsy    . Laparoscopic revision of gastric band N/A 05/17/2012    Procedure: LAPAROSCOPIC REVISION OF SLIPPED GASTRIC BAND;  Surgeon: Mariella SaaBenjamin T Hoxworth, MD;  Location: [facility];  Service: General;  Laterality: N/A;  . Nephrectomy transplanted organ    . Esophagogastroduodenoscopy N/A 04/27/2014    Procedure: ESOPHAGOGASTRODUODENOSCOPY (EGD);  Surgeon: Ovidio Kinavid Newman, MD;  [facility];  Service: General;  Laterality: N/A;   No family history on file. History  Substance Use Topics  . Smoking status: Current Every Day Smoker -- 0.50 packs/day    Types: Cigarettes  . Smokeless tobacco: Never Used  . Alcohol Use: No   OB History    No data  available     Review of Systems  All other systems reviewed and are negative.     Allergies  Demerol and Monosodium glutamate  Home Medications   Prior to Admission medications   Medication Sig Start Date End Date Taking? Authorizing Provider  cetirizine (ZYRTEC) 10 MG tablet Take 10 mg by mouth daily as needed for allergies (allergies).   Yes Historical Provider, MD  clonazePAM (KLONOPIN) 0.5 MG tablet Take 0.5 mg by mouth 3 (three) times daily as needed for anxiety (anxiety).    Yes Historical Provider, MD  diclofenac (VOLTAREN) 75 MG EC tablet Take 75 mg by mouth 2 (two) times daily.   Yes Historical Provider, MD  famotidine (PEPCID) 20 MG tablet Take 1 tablet (20 mg total) by mouth 2 (two) times daily. 04/19/14  Yes Robyn M Hess, PA-C  HYDROcodone-acetaminophen (NORCO) 10-325 MG per tablet Take 1 tablet by mouth 4 (four) times daily.   Yes Historical Provider, MD  Multiple Vitamin (MULITIVITAMIN WITH MINERALS) TABS Take 1 tablet by mouth daily.     Yes Historical Provider, MD  sertraline (ZOLOFT) 50 MG tablet Take 50 mg by mouth daily.   Yes Historical Provider, MD  albuterol (PROVENTIL) (2.5 MG/3ML) 0.083% nebulizer solution Take 2.5 mg by nebulization every 6 (six) hours as needed for wheezing or shortness of breath.    Historical Provider, MD  sulfamethoxazole-trimethoprim (  BACTRIM DS,SEPTRA DS) 800-160 MG per tablet Take 1 tablet by mouth 2 (two) times daily. 04/27/14   Ovidio Kin, MD   BP 138/85 mmHg  Pulse 75  Temp(Src) 99.2 F (37.3 C) (Oral)  Resp 16  SpO2 97%  LMP 04/27/2014 Physical Exam  Constitutional: She is oriented to person, place, and time. She appears well-developed and well-nourished.  Non-toxic appearance. No distress.  HENT:  Head: Normocephalic and atraumatic.  Eyes: Conjunctivae, EOM and lids are normal. Pupils are equal, round, and reactive to light.  Neck: Normal range of motion. Neck supple. No tracheal deviation present. No thyroid mass present.    Cardiovascular: Normal rate, regular rhythm and normal heart sounds.  Exam reveals no gallop.   No murmur heard. Pulmonary/Chest: Effort normal and breath sounds normal. No stridor. No respiratory distress. She has no decreased breath sounds. She has no wheezes. She has no rhonchi. She has no rales.  Abdominal: Soft. Normal appearance and bowel sounds are normal. She exhibits no distension. There is no tenderness. There is no rebound and no CVA tenderness.    Musculoskeletal: Normal range of motion. She exhibits no edema or tenderness.  Neurological: She is alert and oriented to person, place, and time. She has normal strength. No cranial nerve deficit or sensory deficit. GCS eye subscore is 4. GCS verbal subscore is 5. GCS motor subscore is 6.  Skin: Skin is warm and dry. No abrasion and no rash noted.  Psychiatric: She has a normal mood and affect. Her speech is normal and behavior is normal.  Nursing note and vitals reviewed.   ED Course  Procedures (including critical care time) Labs Review Labs Reviewed  CBC WITH DIFFERENTIAL/PLATELET    Imaging Review Dg Elbow Complete Right  04/29/2014   CLINICAL DATA:  Posterior elbow pain with extension, trauma 1 week ago  EXAM: RIGHT ELBOW - COMPLETE 3+ VIEW  COMPARISON:  None.  FINDINGS: There is no evidence of fracture, dislocation, or joint effusion. There is no evidence of arthropathy or other focal bone abnormality. Soft tissues are unremarkable. Apparent incomplete fusion of medial humeral condyle ossification center with well-defined sclerotic margins and no surrounding obscuration of fat planes or soft tissue abnormality to suggest avulsion fracture.  IMPRESSION: No acute osseous abnormality. Probable normal variant incomplete fusion of medial humeral condyle ossification center, less likely avulsion fracture unless the patient is specifically point tender directly at that area.   Electronically Signed   By: Christiana Pellant M.D.   On:  04/29/2014 19:24     EKG Interpretation None      MDM   Final diagnoses:  None   spoke with Dr. Andrey Campanile from general surgery he will come see the patient    Lorre Nick, MD 04/29/14 1958

## 2014-04-29 NOTE — ED Notes (Signed)
Pt states she has to change gauze every 2 hours due to drainage from port. She states she has noticed an odor. She noticed 3 red areas at site overnight.  Denies pain.

## 2014-04-29 NOTE — Progress Notes (Signed)
ANTIBIOTIC CONSULT NOTE - INITIAL  Pharmacy Consult for vancomycin Indication: Cellulitis  Allergies  Allergen Reactions  . Demerol Hives and Nausea And Vomiting  . Monosodium Glutamate Hives, Nausea And Vomiting and Other (See Comments)    migraines    Patient Measurements: Height: 5\' 3"  (160 cm) Weight: 255 lb (115.667 kg) IBW/kg (Calculated) : 52.4   Vital Signs: Temp: 97.4 F (36.3 C) (04/23 2330) Temp Source: Axillary (04/23 2330) BP: 153/88 mmHg (04/23 2330) Pulse Rate: 88 (04/23 2330) Intake/Output from previous day:   Intake/Output from this shift: Total I/O In: 240 [P.O.:240] Out: -   Labs:  Recent Labs  04/29/14 2000  WBC 7.4  HGB 11.8*  PLT 191   Estimated Creatinine Clearance: 114.7 mL/min (by C-G formula based on Cr of 0.45). No results for input(s): VANCOTROUGH, VANCOPEAK, VANCORANDOM, GENTTROUGH, GENTPEAK, GENTRANDOM, TOBRATROUGH, TOBRAPEAK, TOBRARND, AMIKACINPEAK, AMIKACINTROU, AMIKACIN in the last 72 hours.   Microbiology: No results found for this or any previous visit (from the past 720 hour(s)).  Medical History: Past Medical History  Diagnosis Date  . LAP-BAND surgery status   . Hypertension   . Bronchitis   . Arthritis   . Cervical dysplasia   . Depression   . Chronic back pain   . Degenerative disc disease   . Spondylarthrosis   . Migraine     Medications:  Prescriptions prior to admission  Medication Sig Dispense Refill Last Dose  . cetirizine (ZYRTEC) 10 MG tablet Take 10 mg by mouth daily as needed for allergies (allergies).   Past Month at Unknown time  . clonazePAM (KLONOPIN) 0.5 MG tablet Take 0.5 mg by mouth 3 (three) times daily as needed for anxiety (anxiety).    Past Month at Unknown time  . diclofenac (VOLTAREN) 75 MG EC tablet Take 75 mg by mouth 2 (two) times daily.   04/28/2014 at Unknown time  . famotidine (PEPCID) 20 MG tablet Take 1 tablet (20 mg total) by mouth 2 (two) times daily. 30 tablet 0 Past Week at  Unknown time  . HYDROcodone-acetaminophen (NORCO) 10-325 MG per tablet Take 1 tablet by mouth 4 (four) times daily.   04/28/2014 at Unknown time  . Multiple Vitamin (MULITIVITAMIN WITH MINERALS) TABS Take 1 tablet by mouth daily.     04/29/2014 at Unknown time  . sertraline (ZOLOFT) 50 MG tablet Take 50 mg by mouth daily.   04/29/2014 at Unknown time  . albuterol (PROVENTIL) (2.5 MG/3ML) 0.083% nebulizer solution Take 2.5 mg by nebulization every 6 (six) hours as needed for wheezing or shortness of breath.   unknown at unknown time  . sulfamethoxazole-trimethoprim (BACTRIM DS,SEPTRA DS) 800-160 MG per tablet Take 1 tablet by mouth 2 (two) times daily. 20 tablet 1    Scheduled:  . heparin  5,000 Units Subcutaneous 3 times per day  . pantoprazole (PROTONIX) IV  40 mg Intravenous QHS  . [START ON 04/30/2014] sertraline  50 mg Oral Daily  . [START ON 04/30/2014] vancomycin  2,500 mg Intravenous Once   Infusions:  . dextrose 5 % and 0.45 % NaCl with KCl 20 mEq/L     Assessment: 39 yoF s/p LapBand revision who has been having cellulitis around her port site since beginning of the month who presents to the ED now with purulent drainage from port site. [place] gastric [place] Revision 2014.  Vancomycin per Rx for Cellulitis.   Goal of Therapy:  Vancomycin trough level 10-15 mcg/ml  Plan:  Vancomycin  x1 then 1Gm IV q8h   F/u SCr/cultures/levels as needed  Lorenza Evangelist 04/29/2014,11:51 PM

## 2014-04-29 NOTE — H&P (Signed)
Samantha Doyle is an 39 y.o. female.   Chief Complaint: draining pus from port site HPI: The patient is a 39 year old female s/p LapBand revision who has been having cellulitis around her port site since beginning of the month who presents to the ED now with purulent drainage from port site.  She underwent laparoscopic adjustable gastric band surgery in 2009 in [place]. She underwent revision of her lap band by Dr. Johna Sheriff in May 2014 due to a slip. We last saw her in the office on April 12 2014 prior to that it was in  August 25, 2013.   Her weight at that time in August was 244 pounds. She had a small fill at that time. She states that she had been doing well until beginning of April when she noticed some swelling and redness around her supraumbilical incision were port is. She believes her port has migrated in location. She states that the redness on her scan has worsened since last week. It is now progressing. She had from [place] on her abdominal wall for the redness had stopped and it has extended past that. She denies any fevers or chills. She had extensive cellulitis and started her on doxycycline. Sent her for CT which showed soft tissue infection around port but no intra-abominal component. No evidence of slip. She underwent EGD by Dr Ezzard Standing late this week to r/o band erosion- no evidence of erosion. She was in process of getting set up for band port removal however she started draining pus from her port site yesterday and came to ED today. She reports some nausea and 1-2 episodes of regurgitation/emesis.   She denies any reflux or nighttime cough. . She denies any trauma to her abdominal wall. She denies any pain with eating.  Past Medical History  Diagnosis Date  . LAP-BAND surgery status   . Hypertension   . Bronchitis   . Arthritis   . Cervical dysplasia   . Depression   . Chronic back pain   . Degenerative disc disease   . Spondylarthrosis   . Migraine      Past Surgical History  Procedure Laterality Date  . Laparoscopic gastric banding  03/2007    [facility] - [facility]; Dr Jorja Loa Hipp  . Carpal tunnel release    . Varicose vein surgery    . Cervical cone biopsy    . Laparoscopic revision of gastric band N/A 05/17/2012    Procedure: LAPAROSCOPIC REVISION OF SLIPPED GASTRIC BAND;  Surgeon: Mariella Saa, MD;  Location: [facility];  Service: General;  Laterality: N/A;  . Nephrectomy transplanted organ    . Esophagogastroduodenoscopy N/A 04/27/2014    Procedure: ESOPHAGOGASTRODUODENOSCOPY (EGD);  Surgeon: Ovidio Kin, MD;  [facility];  Service: General;  Laterality: N/A;    No family history on file. Social History:  reports that she has been smoking Cigarettes.  She has been smoking about 0.50 packs per day. She has never used smokeless tobacco. She reports that she does not drink alcohol or use illicit drugs.  Allergies:  Allergies  Allergen Reactions  . Demerol Hives and Nausea And Vomiting  . Monosodium Glutamate Hives, Nausea And Vomiting and Other (See Comments)    migraines     (Not in a hospital admission)  Results for orders placed or performed during the hospital encounter of 04/29/14 (from the past 48 hour(s))  CBC with Differential/Platelet     Status: Abnormal   Collection Time: 04/29/14  8:00  PM  Result Value Ref Range   WBC 7.4 4.0 - 10.5 K/uL   RBC 4.23 3.87 - 5.11 MIL/uL   Hemoglobin 11.8 (L) 12.0 - 15.0 g/dL   HCT 40.9 81.1 - 91.4 %   MCV 86.5 78.0 - 100.0 fL   MCH 27.9 26.0 - 34.0 pg   MCHC 32.2 30.0 - 36.0 g/dL   RDW 78.2 95.6 - 21.3 %   Platelets 191 150 - 400 K/uL   Neutrophils Relative % 67 43 - 77 %   Neutro Abs 4.9 1.7 - 7.7 K/uL   Lymphocytes Relative 25 12 - 46 %   Lymphs Abs 1.9 0.7 - 4.0 K/uL   Monocytes Relative 6 3 - 12 %   Monocytes Absolute 0.5 0.1 - 1.0 K/uL   Eosinophils Relative 2 0 - 5 %   Eosinophils Absolute 0.2 0.0 - 0.7 K/uL   Basophils Relative 0 0 - 1  %   Basophils Absolute 0.0 0.0 - 0.1 K/uL   Dg Elbow Complete Right  04/29/2014   CLINICAL DATA:  Posterior elbow pain with extension, trauma 1 week ago  EXAM: RIGHT ELBOW - COMPLETE 3+ VIEW  COMPARISON:  None.  FINDINGS: There is no evidence of fracture, dislocation, or joint effusion. There is no evidence of arthropathy or other focal bone abnormality. Soft tissues are unremarkable. Apparent incomplete fusion of medial humeral condyle ossification center with well-defined sclerotic margins and no surrounding obscuration of fat planes or soft tissue abnormality to suggest avulsion fracture.  IMPRESSION: No acute osseous abnormality. Probable normal variant incomplete fusion of medial humeral condyle ossification center, less likely avulsion fracture unless the patient is specifically point tender directly at that area.   Electronically Signed   By: Christiana Pellant M.D.   On: 04/29/2014 19:24    Review of Systems  Constitutional: Negative for fever, chills and weight loss.  HENT: Negative for nosebleeds.   Eyes: Negative for blurred vision.  Respiratory: Negative for shortness of breath.   Cardiovascular: Negative for chest pain, palpitations, orthopnea and PND.       Denies DOE  Gastrointestinal: Positive for nausea, vomiting and abdominal pain.  Genitourinary: Negative for dysuria and hematuria.  Musculoskeletal: Negative.   Skin: Negative for itching and rash.  Neurological: Negative for dizziness, focal weakness, seizures, loss of consciousness and headaches.       Denies TIAs, amaurosis fugax  Endo/Heme/Allergies: Does not bruise/bleed easily.  Psychiatric/Behavioral: The patient is not nervous/anxious.     Blood pressure 138/85, pulse 75, temperature 99.2 F (37.3 C), temperature source Oral, resp. rate 16, last menstrual period 04/27/2014, SpO2 97 %. Physical Exam  Vitals reviewed. Constitutional: She is oriented to person, place, and time. She appears well-developed and  well-nourished. No distress.  HENT:  Head: Normocephalic and atraumatic.  Right Ear: External ear normal.  Left Ear: External ear normal.  Eyes: Conjunctivae are normal. No scleral icterus.  Neck: Normal range of motion. Neck supple. No tracheal deviation present. No thyromegaly present.  Cardiovascular: Normal rate and normal heart sounds.   Respiratory: Effort normal and breath sounds normal. No stridor. No respiratory distress. She has no wheezes.  GI: Soft. There is no tenderness. There is no rebound and no guarding.    Old trocar scars. lapband port in mid abdomen. Mild surrounding cellulitis. Some tape breakdown on skin. Purulent drainage from old lap band port site. No palp abscess  Musculoskeletal: She exhibits no edema or tenderness.  Lymphadenopathy:    She has  no cervical adenopathy.  Neurological: She is alert and oriented to person, place, and time. She exhibits normal muscle tone.  Skin: Skin is warm and dry. No rash noted. She is not diaphoretic. No erythema. No pallor.  Psychiatric: She has a normal mood and affect. Her behavior is normal. Judgment and thought content normal.     Assessment/Plan LapBand port site infection with cellulitis Morbid obesity HTN  Although her cellulitis has improved since I saw her in the office she still has ongoing cellulitis and now has purulent drainage from port site. No evidence of band erosion on CT or EGD. She need her port removed.   rec admit for IV abx Diagnostic laparoscopy, removal of lap band port in AM NPO p MN VTE prophylaxis  Discussed risk/benefits of surgery with pt who is agreement with plan. Pt has expressed she ultimately desires her band to be removed and converted to sleeve gastrectomy. Explained our first priority is getting rid of infected port and she can f/u with dr Johna Sheriffhoxworth to have those discussions.  Explained that she would have an open wound to let the infection clear.   Mary SellaEric M. Andrey CampanileWilson, MD, FACS General,  Bariatric, & Minimally Invasive Surgery [facility] [place]  [facility] Fargo[facility]ILSON,Demani Mcbrien M 04/29/2014, 8:58 PM

## 2014-04-29 NOTE — ED Notes (Addendum)
Pt states that she had lap band surgery 2 years ago.  States that they thought her site was infected a month ago, gave her abx, had a reaction to the abx.  States that she had an endoscopy because it wasn't a reaction, it was the band.  States that on Thursday, her site started oozing and having redness.  Also wants to be seen for elbow pain (rt) because she hit elbow on a door frame about a week ago.

## 2014-04-30 ENCOUNTER — Encounter (HOSPITAL_COMMUNITY): Admission: EM | Disposition: A | Discharge status: Home / Self Care | Payer: Self-pay | Source: Home / Self Care

## 2014-04-30 ENCOUNTER — Encounter (HOSPITAL_COMMUNITY): Payer: Self-pay | Admitting: *Deleted

## 2014-04-30 ENCOUNTER — Observation Stay (HOSPITAL_COMMUNITY): Payer: BLUE CROSS/BLUE SHIELD | Admitting: Anesthesiology

## 2014-04-30 DIAGNOSIS — L03311 Cellulitis of abdominal wall: Secondary | ICD-10-CM | POA: Diagnosis present

## 2014-04-30 DIAGNOSIS — M549 Dorsalgia, unspecified: Secondary | ICD-10-CM | POA: Diagnosis present

## 2014-04-30 DIAGNOSIS — I10 Essential (primary) hypertension: Secondary | ICD-10-CM | POA: Diagnosis present

## 2014-04-30 DIAGNOSIS — M1990 Unspecified osteoarthritis, unspecified site: Secondary | ICD-10-CM | POA: Diagnosis present

## 2014-04-30 DIAGNOSIS — L270 Generalized skin eruption due to drugs and medicaments taken internally: Secondary | ICD-10-CM | POA: Diagnosis not present

## 2014-04-30 DIAGNOSIS — Z881 Allergy status to other antibiotic agents status: Secondary | ICD-10-CM | POA: Diagnosis not present

## 2014-04-30 DIAGNOSIS — R229 Localized swelling, mass and lump, unspecified: Secondary | ICD-10-CM | POA: Diagnosis not present

## 2014-04-30 DIAGNOSIS — Z888 Allergy status to other drugs, medicaments and biological substances status: Secondary | ICD-10-CM | POA: Diagnosis not present

## 2014-04-30 DIAGNOSIS — L02211 Cutaneous abscess of abdominal wall: Secondary | ICD-10-CM | POA: Diagnosis present

## 2014-04-30 DIAGNOSIS — Z6841 Body Mass Index (BMI) 40.0 and over, adult: Secondary | ICD-10-CM | POA: Diagnosis not present

## 2014-04-30 DIAGNOSIS — T368X5A Adverse effect of other systemic antibiotics, initial encounter: Secondary | ICD-10-CM | POA: Diagnosis not present

## 2014-04-30 DIAGNOSIS — G8929 Other chronic pain: Secondary | ICD-10-CM | POA: Diagnosis present

## 2014-04-30 DIAGNOSIS — Z87891 Personal history of nicotine dependence: Secondary | ICD-10-CM | POA: Diagnosis not present

## 2014-04-30 DIAGNOSIS — K219 Gastro-esophageal reflux disease without esophagitis: Secondary | ICD-10-CM | POA: Diagnosis present

## 2014-04-30 DIAGNOSIS — Z87410 Personal history of cervical dysplasia: Secondary | ICD-10-CM | POA: Diagnosis not present

## 2014-04-30 DIAGNOSIS — F329 Major depressive disorder, single episode, unspecified: Secondary | ICD-10-CM | POA: Diagnosis present

## 2014-04-30 DIAGNOSIS — K9501 Infection due to gastric band procedure: Secondary | ICD-10-CM | POA: Diagnosis present

## 2014-04-30 DIAGNOSIS — Z885 Allergy status to narcotic agent status: Secondary | ICD-10-CM | POA: Diagnosis not present

## 2014-04-30 HISTORY — PX: LAPAROSCOPY: SHX197

## 2014-04-30 LAB — BASIC METABOLIC PANEL
Anion gap: 4 — ABNORMAL LOW (ref 5–15)
BUN: 7 mg/dL (ref 6–23)
CALCIUM: 8.1 mg/dL — AB (ref 8.4–10.5)
CHLORIDE: 107 mmol/L (ref 96–112)
CO2: 27 mmol/L (ref 19–32)
Creatinine, Ser: 0.59 mg/dL (ref 0.50–1.10)
GFR calc Af Amer: >90 mL/min (ref >90)
GFR calc non Af Amer: >90 mL/min (ref >90)
Glucose, Bld: 98 mg/dL (ref 70–99)
Potassium: 3.5 mmol/L (ref 3.5–5.1)
SODIUM: 138 mmol/L (ref 135–145)

## 2014-04-30 LAB — SURGICAL PCR SCREEN
MRSA, PCR: NEGATIVE
STAPHYLOCOCCUS AUREUS: NEGATIVE

## 2014-04-30 SURGERY — LAPAROSCOPY, DIAGNOSTIC
Anesthesia: General

## 2014-04-30 MED ORDER — ONDANSETRON HCL 4 MG/2ML IJ SOLN
INTRAMUSCULAR | Status: AC
  Filled 2014-04-30: qty 2

## 2014-04-30 MED ORDER — LIP MEDEX EX OINT
TOPICAL_OINTMENT | CUTANEOUS | Status: AC
  Administered 2014-04-30: 13:00:00
  Filled 2014-04-30: qty 7

## 2014-04-30 MED ORDER — MIDAZOLAM HCL 5 MG/5ML IJ SOLN
INTRAMUSCULAR | Status: DC | PRN
  Administered 2014-04-30: 2 mg via INTRAVENOUS

## 2014-04-30 MED ORDER — SUCCINYLCHOLINE CHLORIDE 20 MG/ML IJ SOLN
INTRAMUSCULAR | Status: DC | PRN
  Administered 2014-04-30: 100 mg via INTRAVENOUS

## 2014-04-30 MED ORDER — DEXAMETHASONE SODIUM PHOSPHATE 10 MG/ML IJ SOLN
INTRAMUSCULAR | Status: AC
  Filled 2014-04-30: qty 1

## 2014-04-30 MED ORDER — NEOSTIGMINE METHYLSULFATE 10 MG/10ML IV SOLN
INTRAVENOUS | Status: DC | PRN
  Administered 2014-04-30: 4 mg via INTRAVENOUS

## 2014-04-30 MED ORDER — KCL IN DEXTROSE-NACL 20-5-0.45 MEQ/L-%-% IV SOLN
INTRAVENOUS | Status: AC
  Administered 2014-04-30: 1000 mL
  Filled 2014-04-30: qty 1000

## 2014-04-30 MED ORDER — ONDANSETRON HCL 4 MG/2ML IJ SOLN
INTRAMUSCULAR | Status: DC | PRN
  Administered 2014-04-30: 4 mg via INTRAVENOUS

## 2014-04-30 MED ORDER — CISATRACURIUM BESYLATE (PF) 10 MG/5ML IV SOLN
INTRAVENOUS | Status: DC | PRN
  Administered 2014-04-30: 4 mg via INTRAVENOUS

## 2014-04-30 MED ORDER — GLYCOPYRROLATE 0.2 MG/ML IJ SOLN
INTRAMUSCULAR | Status: AC
  Filled 2014-04-30: qty 3

## 2014-04-30 MED ORDER — BUPIVACAINE-EPINEPHRINE (PF) 0.25% -1:200000 IJ SOLN
INTRAMUSCULAR | Status: AC
  Filled 2014-04-30: qty 30

## 2014-04-30 MED ORDER — 0.9 % SODIUM CHLORIDE (POUR BTL) OPTIME
TOPICAL | Status: DC | PRN
  Administered 2014-04-30: 1000 mL

## 2014-04-30 MED ORDER — LACTATED RINGERS IV SOLN
INTRAVENOUS | Status: DC | PRN
  Administered 2014-04-30: 10:00:00 via INTRAVENOUS

## 2014-04-30 MED ORDER — VANCOMYCIN HCL IN DEXTROSE 1-5 GM/200ML-% IV SOLN
1000.0000 mg | Freq: Three times a day (TID) | INTRAVENOUS | Status: DC
  Filled 2014-04-30 (×2): qty 200

## 2014-04-30 MED ORDER — OXYCODONE HCL 5 MG PO TABS
5.0000 mg | ORAL_TABLET | ORAL | Status: DC | PRN
  Administered 2014-04-30 – 2014-05-01 (×4): 10 mg via ORAL
  Filled 2014-04-30 (×4): qty 2

## 2014-04-30 MED ORDER — METHOCARBAMOL 1000 MG/10ML IJ SOLN
1000.0000 mg | Freq: Three times a day (TID) | INTRAMUSCULAR | Status: DC
  Administered 2014-04-30 – 2014-05-01 (×3): 1000 mg via INTRAVENOUS
  Filled 2014-04-30 (×6): qty 10

## 2014-04-30 MED ORDER — HYDROMORPHONE HCL 1 MG/ML IJ SOLN
0.2500 mg | INTRAMUSCULAR | Status: DC | PRN
  Administered 2014-04-30 (×3): 0.5 mg via INTRAVENOUS

## 2014-04-30 MED ORDER — CEFAZOLIN SODIUM-DEXTROSE 2-3 GM-% IV SOLR
2.0000 g | Freq: Three times a day (TID) | INTRAVENOUS | Status: DC
  Administered 2014-04-30 – 2014-05-01 (×2): 2 g via INTRAVENOUS
  Filled 2014-04-30 (×4): qty 50

## 2014-04-30 MED ORDER — FENTANYL CITRATE (PF) 250 MCG/5ML IJ SOLN
INTRAMUSCULAR | Status: AC
  Filled 2014-04-30: qty 5

## 2014-04-30 MED ORDER — DEXAMETHASONE SODIUM PHOSPHATE 10 MG/ML IJ SOLN
INTRAMUSCULAR | Status: DC | PRN
  Administered 2014-04-30: 10 mg via INTRAVENOUS

## 2014-04-30 MED ORDER — FENTANYL CITRATE (PF) 250 MCG/5ML IJ SOLN
INTRAMUSCULAR | Status: DC | PRN
  Administered 2014-04-30: 25 ug via INTRAVENOUS
  Administered 2014-04-30: 50 ug via INTRAVENOUS
  Administered 2014-04-30: 100 ug via INTRAVENOUS
  Administered 2014-04-30: 50 ug via INTRAVENOUS
  Administered 2014-04-30: 25 ug via INTRAVENOUS

## 2014-04-30 MED ORDER — BUPIVACAINE-EPINEPHRINE 0.25% -1:200000 IJ SOLN
INTRAMUSCULAR | Status: DC | PRN
  Administered 2014-04-30: 8 mL

## 2014-04-30 MED ORDER — HEPARIN SODIUM (PORCINE) 5000 UNIT/ML IJ SOLN
5000.0000 [IU] | Freq: Three times a day (TID) | INTRAMUSCULAR | Status: DC
  Administered 2014-04-30 – 2014-05-01 (×2): 5000 [IU] via SUBCUTANEOUS
  Filled 2014-04-30 (×5): qty 1

## 2014-04-30 MED ORDER — GLYCOPYRROLATE 0.2 MG/ML IJ SOLN
INTRAMUSCULAR | Status: DC | PRN
  Administered 2014-04-30: 0.4 mg via INTRAVENOUS
  Administered 2014-04-30: 0.6 mg via INTRAVENOUS

## 2014-04-30 MED ORDER — HYDROMORPHONE HCL 1 MG/ML IJ SOLN
INTRAMUSCULAR | Status: AC
  Filled 2014-04-30: qty 2

## 2014-04-30 MED ORDER — GLYCOPYRROLATE 0.2 MG/ML IJ SOLN
INTRAMUSCULAR | Status: AC
  Filled 2014-04-30: qty 2

## 2014-04-30 MED ORDER — MIDAZOLAM HCL 2 MG/2ML IJ SOLN
INTRAMUSCULAR | Status: AC
  Filled 2014-04-30: qty 2

## 2014-04-30 MED ORDER — SUMATRIPTAN SUCCINATE 50 MG PO TABS
50.0000 mg | ORAL_TABLET | ORAL | Status: DC | PRN
  Administered 2014-04-30 (×2): 50 mg via ORAL
  Filled 2014-04-30 (×4): qty 1

## 2014-04-30 MED ORDER — CISATRACURIUM BESYLATE 20 MG/10ML IV SOLN
INTRAVENOUS | Status: AC
  Filled 2014-04-30: qty 10

## 2014-04-30 MED ORDER — CEFAZOLIN SODIUM-DEXTROSE 2-3 GM-% IV SOLR
2.0000 g | INTRAVENOUS | Status: AC
  Administered 2014-04-30: 2 g via INTRAVENOUS
  Filled 2014-04-30: qty 50

## 2014-04-30 MED ORDER — PROPOFOL 10 MG/ML IV BOLUS
INTRAVENOUS | Status: AC
  Filled 2014-04-30: qty 20

## 2014-04-30 MED ORDER — LACTATED RINGERS IV SOLN: INTRAVENOUS | Status: DC

## 2014-04-30 MED ORDER — CEFAZOLIN SODIUM-DEXTROSE 2-3 GM-% IV SOLR
INTRAVENOUS | Status: AC
  Filled 2014-04-30: qty 50

## 2014-04-30 MED ORDER — PROPOFOL 10 MG/ML IV BOLUS
INTRAVENOUS | Status: DC | PRN
  Administered 2014-04-30: 150 mg via INTRAVENOUS

## 2014-04-30 SURGICAL SUPPLY — 64 items
APPLIER CLIP 5 13 M/L LIGAMAX5 (MISCELLANEOUS)
APPLIER CLIP ROT 10 11.4 M/L (STAPLE)
BLADE EXTENDED COATED 6.5IN (ELECTRODE) IMPLANT
BLADE HEX COATED 2.75 (ELECTRODE) ×2 IMPLANT
BLADE SURG SZ10 CARB STEEL (BLADE) IMPLANT
CLIP APPLIE 5 13 M/L LIGAMAX5 (MISCELLANEOUS) IMPLANT
CLIP APPLIE ROT 10 11.4 M/L (STAPLE) IMPLANT
COVER MAYO STAND STRL (DRAPES) ×2 IMPLANT
DECANTER SPIKE VIAL GLASS SM (MISCELLANEOUS) ×2 IMPLANT
DRAPE LAPAROSCOPIC ABDOMINAL (DRAPES) ×2 IMPLANT
DRAPE WARM FLUID 44X44 (DRAPE) IMPLANT
DRSG PAD ABDOMINAL 8X10 ST (GAUZE/BANDAGES/DRESSINGS) ×2 IMPLANT
ELECT REM PT RETURN 9FT ADLT (ELECTROSURGICAL) ×2
ELECTRODE REM PT RTRN 9FT ADLT (ELECTROSURGICAL) ×1 IMPLANT
FILTER SMOKE EVAC LAPAROSHD (FILTER) IMPLANT
GAUZE SPONGE 4X4 12PLY STRL (GAUZE/BANDAGES/DRESSINGS) ×2 IMPLANT
GLOVE BIO SURGEON STRL SZ7 (GLOVE) ×2 IMPLANT
GLOVE BIO SURGEON STRL SZ7.5 (GLOVE) ×2 IMPLANT
GLOVE BIOGEL M 7.0 STRL (GLOVE) ×2 IMPLANT
GLOVE BIOGEL M STRL SZ7.5 (GLOVE) IMPLANT
GLOVE BIOGEL PI IND STRL 7.0 (GLOVE) ×1 IMPLANT
GLOVE BIOGEL PI INDICATOR 7.0 (GLOVE) ×1
GLOVE INDICATOR 8.0 STRL GRN (GLOVE) ×2 IMPLANT
GOWN STRL REUS W/ TWL XL LVL3 (GOWN DISPOSABLE) ×1 IMPLANT
GOWN STRL REUS W/TWL LRG LVL3 (GOWN DISPOSABLE) ×4 IMPLANT
GOWN STRL REUS W/TWL XL LVL3 (GOWN DISPOSABLE) ×3 IMPLANT
HOVERMATT SINGLE USE (MISCELLANEOUS) ×2 IMPLANT
KIT BASIN OR (CUSTOM PROCEDURE TRAY) ×2 IMPLANT
LIGASURE IMPACT 36 18CM CVD LR (INSTRUMENTS) IMPLANT
LIQUID BAND (GAUZE/BANDAGES/DRESSINGS) IMPLANT
NEEDLE ACCESS PORT 2 (NEEDLE) ×2 IMPLANT
NS IRRIG 1000ML POUR BTL (IV SOLUTION) ×2 IMPLANT
PENCIL BUTTON HOLSTER BLD 10FT (ELECTRODE) ×2 IMPLANT
SET IRRIG TUBING LAPAROSCOPIC (IRRIGATION / IRRIGATOR) IMPLANT
SHEARS HARMONIC ACE PLUS 36CM (ENDOMECHANICALS) IMPLANT
SOLUTION ANTI FOG 6CC (MISCELLANEOUS) ×2 IMPLANT
SPONGE LAP 18X18 X RAY DECT (DISPOSABLE) IMPLANT
STAPLER VISISTAT 35W (STAPLE) IMPLANT
STRIP CLOSURE SKIN 1/2X4 (GAUZE/BANDAGES/DRESSINGS) IMPLANT
SUCTION POOLE TIP (SUCTIONS) IMPLANT
SUT PDS AB 1 TP1 96 (SUTURE) IMPLANT
SUT PROLENE 2 0 KS (SUTURE) IMPLANT
SUT PROLENE 2 0 SH DA (SUTURE) IMPLANT
SUT SILK 2 0 (SUTURE)
SUT SILK 2 0 SH CR/8 (SUTURE) IMPLANT
SUT SILK 2-0 18XBRD TIE 12 (SUTURE) IMPLANT
SUT SILK 3 0 (SUTURE)
SUT SILK 3 0 SH 30 (SUTURE) ×2 IMPLANT
SUT SILK 3 0 SH CR/8 (SUTURE) IMPLANT
SUT SILK 3-0 18XBRD TIE 12 (SUTURE) IMPLANT
SYS LAPSCP GELPORT 120MM (MISCELLANEOUS)
SYSTEM LAPSCP GELPORT 120MM (MISCELLANEOUS) IMPLANT
TAPE PAPER 3X10 WHT MICROPORE (GAUZE/BANDAGES/DRESSINGS) ×2 IMPLANT
TOWEL OR 17X26 10 PK STRL BLUE (TOWEL DISPOSABLE) ×2 IMPLANT
TRAY FOLEY W/METER SILVER 14FR (SET/KITS/TRAYS/PACK) ×2 IMPLANT
TRAY LAPAROSCOPIC (CUSTOM PROCEDURE TRAY) ×2 IMPLANT
TROCAR BLADELESS OPT 5 75 (ENDOMECHANICALS) ×2 IMPLANT
TROCAR XCEL 12X100 BLDLESS (ENDOMECHANICALS) IMPLANT
TROCAR XCEL BLUNT TIP 100MML (ENDOMECHANICALS) IMPLANT
TROCAR XCEL NON-BLD 11X100MML (ENDOMECHANICALS) IMPLANT
TROCAR XCEL UNIV SLVE 11M 100M (ENDOMECHANICALS) IMPLANT
TUBING INSUFFLATION 10FT LAP (TUBING) ×2 IMPLANT
YANKAUER SUCT BULB TIP 10FT TU (MISCELLANEOUS) IMPLANT
YANKAUER SUCT BULB TIP NO VENT (SUCTIONS) IMPLANT

## 2014-04-30 NOTE — Anesthesia Postprocedure Evaluation (Signed)
  Anesthesia Post-op Note  Patient: Samantha Doyle  Procedure(s) Performed: Procedure(s) (LRB): DIAGNOSTIC LAPAROSCOPY WITH REMOVAL OF INFECTED LAP BAND PORT WITH DEBRIDEMENT SUBCUTANEOUS ABSCESS (N/A)  Patient Location: PACU  Anesthesia Type: General  Level of Consciousness: awake and alert   Airway and Oxygen Therapy: Patient Spontanous Breathing  Post-op Pain: mild  Post-op Assessment: Post-op Vital signs reviewed, Patient's Cardiovascular Status Stable, Respiratory Function Stable, Patent Airway and No signs of Nausea or vomiting  Last Vitals:  Filed Vitals:   04/30/14 0533  BP: 131/79  Pulse: 56  Temp: 36.8 C  Resp: 16    Post-op Vital Signs: stable   Complications: No apparent anesthesia complications

## 2014-04-30 NOTE — Op Note (Signed)
Samantha Doyle, Samantha Doyle NO.:  [id number]  MEDICAL RECORD NO.:  [id number]  LOCATION:  [facility]                         FACILITY:  [facility]  PHYSICIAN:  Mary Sella. Andrey Campanile, MD, FACSDATE OF BIRTH:  May 26, 1975  DATE OF PROCEDURE:  04/30/2014 DATE OF DISCHARGE:                              OPERATIVE REPORT   PREOPERATIVE DIAGNOSIS:  Infected lap band port.  POSTOPERATIVE DIAGNOSIS:  Infected lap band port.  PROCEDURE: 1. Diagnostic laparoscopy. 2. Removal of infected lap band port with debridement of skin and     subcutaneous abscess cavity, 3 x 2 cm with scalpel.  SURGEON:  Mary Sella. Andrey Campanile, MD, FACS  ASSISTANT SURGEON:  None.  ANESTHESIA:  General.  ESTIMATED BLOOD LOSS:  Minimal.  DRAINS:  None.  INDICATIONS FOR PROCEDURE:  The patient is a pleasant 39 year old, Caucasian female, who had a lap band placed in [place] in 2009.  She moved to the [place] area around 2014.  She presented to our office with a lap band slip.  In May 2014, she underwent a lap band revision due to her slip.  The last time we had seen her on a regular basis was in August 2015.  She presented to the office at the beginning of the month with spontaneous cellulitis of her port site.  We started her on oral antibiotics and got the CT scan, which did not demonstrate any significant intraabdominal component.  There was extensive soft tissue inflammation around the port, but the band appeared to be in good position.  The cause when anybody presents with spontaneous port site infection this far out from initial insertion, there is concern for band erosion.  She underwent upper endoscopy by Dr. Ezzard Standing earlier this week, which did not demonstrate any evidence of band erosion.  She had good response to doxycycline, however, she still had some persistent cellulitis and was switched to Bactrim.  She presented to the emergency room yesterday complaining of several days of purulent drainage from  her port site.  Because her infection was not getting any better and ultimately needed to have her port removed for definitive management, I admitted her and placed her on IV antibiotics and recommended taking out her port in the operating room today.  We discussed the risks and benefits of surgery including, but not limited to, bleeding, infection, injury to surrounding structures, need to convert to an open procedure, scarring, blood clot formation, perioperative cardiac and pulmonary issues, the typical postoperative course, the need to leave an open wound.  She elected to proceed to surgery.  DESCRIPTION OF PROCEDURE:  After obtaining informed consent, she was taken the OR 1 at [facility], placed supine on the operating room table.  General endotracheal anesthesia was established. Sequential compression devices were placed.  Her abdomen was prepped and draped in usual standard surgical fashion with ChloraPrep.  The port site was prepped last.  She received IV Ancef prior to skin incision. She had gotten a dose of vancomycin overnight, however, it had to be stopped due to an allergic reaction.  The surgical time-out was performed.  I first wanted to do a diagnostic laparoscopy, I planned to cut the tubing intraabdominally to minimize  contamination of the rest of the tubing as one had to pull it up through the port site.  A 5-mm trocar was placed in the right upper quadrant and advanced to the abdominal wall using Optiview technique and carefully entered the abdominal wall.  Pneumoperitoneum was smoothly established up to a patient pressure of 15 mmHg.  Laparoscope was advanced and abdominal cavity was surveilled.  There was no evidence of injury to surrounding structure.  The tubing was visualized coming up through the abdominal wall in the mid abdomen, there was some mild adhesions around it as typically seen.  I then placed a 5-mm trocar in the left mid abdomen just to  the left of the umbilicus.  The camera was placed in that trocar.  We then placed her in reverse Trendelenburg, and I lifted up the liver to visualize the band.  It appeared to be in good position. There was no evidence of slip.  There was no evidence of a plication as best I could tell, perhaps along the fundus, however, I could grab the band and easily rotate it in a shoe shine fashion, and it easily twisted.  At this point, I incised the skin with a 15-blade over the old scar where the port site was where it was spontaneously draining purulent fluid.  The subcutaneous tissue was divided sharply with scalpel.  The port was identified using a Huber needle.  I drained the band of 5.5 mL of fluid.  I then returned intraabdominally and cut the band tubing leaving a fairly lengthy segment of tubing within the abdomen.  The port was passed off the field.  I extracted the band tubing that was connected to the band through the 5 mm trocar site and tied a silk suture around in order to obliterate the lumen to prevent any fibrous obliteration of the lumen.  The tubing was then returned to the abdomen and laid in the right abdomen along the pericolic gutter. At this point, pneumoperitoneum was released.  The trocars were removed. I then sharply excised the fibrous capsule and nonviable subcutaneous fat.  It was approximately 3 x 2 cm.  I then obtained hemostasis with electrocautery.  Moist gauze was placed into the wound followed by dry gauze and tape.  I then changed gloves and placed a 4-0 Monocryl subcuticular fashion in the 2 trocar sites followed by benzoin and Steri- Strips.  The patient was extubated and taken to recovery room in stable condition.  There were no immediate complications.  The patient tolerated the procedure well.     Mary SellaEric M. Andrey CampanileWilson, MD, FACS     EMW/MEDQ  D:  04/30/2014  T:  04/30/2014  Job:  [id number]

## 2014-04-30 NOTE — Anesthesia Preprocedure Evaluation (Addendum)
Anesthesia Evaluation  Patient identified by MRN, date of birth, ID band Patient awake    Reviewed: Allergy & Precautions, H&P , NPO status , Patient's Chart, lab work & pertinent test results  Airway Mallampati: II  TM Distance: >3 FB Neck ROM: Full    Dental  (+) Chipped, Missing, Dental Advisory Given   Pulmonary Current Smoker,  bronchitis breath sounds clear to auscultation  Pulmonary exam normal       Cardiovascular hypertension, Rhythm:Regular Rate:Normal     Neuro/Psych negative neurological ROS  negative psych ROS   GI/Hepatic negative GI ROS, Neg liver ROS,   Endo/Other  negative endocrine ROSMorbid obesity  Renal/GU negative Renal ROS  negative genitourinary   Musculoskeletal negative musculoskeletal ROS (+)   Abdominal (+) + obese,   Peds negative pediatric ROS (+)  Hematology negative hematology ROS (+)   Anesthesia Other Findings   Reproductive/Obstetrics negative OB ROS                            Anesthesia Physical Anesthesia Plan  ASA: III  Anesthesia Plan: General   Post-op Pain Management:    Induction: Intravenous  Airway Management Planned: Oral ETT  Additional Equipment:   Intra-op Plan:   Post-operative Plan: Extubation in OR  Informed Consent:   Plan Discussed with: Surgeon  Anesthesia Plan Comments:        Anesthesia Quick Evaluation

## 2014-04-30 NOTE — Interval H&P Note (Signed)
History and Physical Interval Note:  04/30/2014 10:12 AM  Samantha Doyle  has presented today for surgery, with the diagnosis of infected lap band port  The various methods of treatment have been discussed with the patient and family. After consideration of risks, benefits and other options for treatment, the patient has consented to  Procedure(s): DIAGNOSTIC LAPAROSCOPY WITH REMOVAL OF INFECTED LAP BAND PORT (N/A) as a surgical intervention .  The patient's history has been reviewed, patient examined, no change in status, stable for surgery.  I have reviewed the patient's chart and labs.  Questions were answered to the patient's satisfaction.    Had allergic reaction to Vanc overnight. Swollen lips, truncal rash, itching. vanc stopped. Rash and swollen lips resolved.  Told pt to add vancomycin to her allergy list  rediscussed risk/benefits of surgery  - bleeding, infection, need for addl procedures, scarring, injury to surrounding structures; blood clot, anethesia concerns, typical postop course.  Mary SellaEric M. Andrey CampanileWilson, MD, FACS General, Bariatric, & Minimally Invasive Surgery [facility] [place]   [facility] Paul[facility] HospitalWILSON,Nicky Kras M

## 2014-04-30 NOTE — Brief Op Note (Signed)
04/29/2014 - 04/30/2014  11:38 AM  PATIENT:  Herbert SetaHeather A Quesinberry  39 y.o. female  PRE-OPERATIVE DIAGNOSIS:  infected lap band port  POST-OPERATIVE DIAGNOSIS:  infected lap band port  PROCEDURE:  Procedure(s): DIAGNOSTIC LAPAROSCOPY WITH REMOVAL OF INFECTED LAP BAND PORT WITH DEBRIDEMENT OF SKIN & SUBCUTANEOUS ABSCESS (3x2cm); REMOVAL OF LAPBAND FLUID (5.5cc) (N/A)  SURGEON:  Surgeon(s) and Role:    * Gaynelle AduEric Jammie Troup, MD - Primary  PHYSICIAN ASSISTANT: none  ASSISTANTS: none   ANESTHESIA:   general  EBL:  Total I/O In: -  Out: 1 [Urine:1]  BLOOD ADMINISTERED:none  DRAINS: none   LOCAL MEDICATIONS USED:  MARCAINE     SPECIMEN:  Source of Specimen:  band port with tubing  DISPOSITION OF SPECIMEN:  PATHOLOGY  COUNTS:  YES  TOURNIQUET:  * No tourniquets in log *  DICTATION: .Other Dictation: Dictation Number [id number]  PLAN OF CARE: pacu then floor  PATIENT DISPOSITION:  PACU - hemodynamically stable.   Delay start of Pharmacological VTE agent (>24hrs) due to surgical blood loss or risk of bleeding: no

## 2014-04-30 NOTE — Anesthesia Procedure Notes (Signed)
Procedure Name: Intubation Date/Time: 04/30/2014 10:48 AM Performed by: Delphia GratesHANDLER, Benjamim Harnish Pre-anesthesia Checklist: Patient identified, Emergency Drugs available, Suction available and Patient being monitored Patient Re-evaluated:Patient Re-evaluated prior to inductionOxygen Delivery Method: Circle system utilized Preoxygenation: Pre-oxygenation with 100% oxygen Intubation Type: IV induction Laryngoscope Size: Mac and 4 Grade View: Grade I Tube type: Oral Tube size: 7.5 mm Number of attempts: 1 Airway Equipment and Method: Stylet Secured at: 21 cm Tube secured with: Tape Dental Injury: Teeth and Oropharynx as per pre-operative assessment

## 2014-04-30 NOTE — Progress Notes (Signed)
UR completed 

## 2014-04-30 NOTE — Transfer of Care (Signed)
Immediate Anesthesia Transfer of Care Note  Patient: Samantha MayansHeather A Opfer  Procedure(s) Performed: Procedure(s): DIAGNOSTIC LAPAROSCOPY WITH REMOVAL OF INFECTED LAP BAND PORT WITH DEBRIDEMENT SUBCUTANEOUS ABSCESS (N/A)  Patient Location: PACU  Anesthesia Type:General  Level of Consciousness: awake, alert , oriented and patient cooperative  Airway & Oxygen Therapy: Patient Spontanous Breathing and Patient connected to face mask oxygen  Post-op Assessment: Report given to RN and Post -op Vital signs reviewed and stable  Post vital signs: Reviewed and stable  Last Vitals:  Filed Vitals:   04/30/14 0533  BP: 131/79  Pulse: 56  Temp: 36.8 C  Resp: 16    Complications: No apparent anesthesia complications

## 2014-04-30 NOTE — Progress Notes (Signed)
Vancomycin IV administered. Pt called out complaining she was itching, felt warm and skin was red. Pt also stated she felt her lips were swollen.  Pt reported she stopped ABX. Coralie CommonJackie Clarke, RN administered PRN benadryl IV. Pt reported she felt better. Pharmacy notified. Will continue to monitor.

## 2014-05-01 ENCOUNTER — Encounter (HOSPITAL_COMMUNITY): Payer: Self-pay | Admitting: General Surgery

## 2014-05-01 MED ORDER — PANTOPRAZOLE SODIUM 40 MG PO TBEC: 40.0000 mg | DELAYED_RELEASE_TABLET | Freq: Every day | ORAL | Status: DC

## 2014-05-01 NOTE — Progress Notes (Signed)
Patient discharged home, all discharge instructions and medications reviewed and questions answered.  Patient to be assisted to vehicle by wheelchair.

## 2014-05-01 NOTE — Discharge Instructions (Signed)
ADJUSTABLE GASTRIC BAND  Home Care Instructions   These instructions are to help you care for yourself when you go home.  Call: If you have any problems.  Call [phone] and ask for the surgeon on call  If [facility] need immediate [facility] Tell the ER staff you are a new post-op gastric banding patient  Signs and symptoms to report:  Severe  vomiting or nausea o If you cannot handle clear liquids for longer than 1 day, call your surgeon  Abdominal pain which does not get better after taking your pain medication  Fever greater than 101  F and chills  Heart rate over 100 beats a minute  Trouble breathing  Chest pain  Worsening Redness,  swelling, drainage, or foul odor at incision (surgical) sites  If your incisions open or pull apart  Swelling or pain in calf (lower leg)  Diarrhea (Loose bowel movements that happen often), frequent watery, uncontrolled bowel movements  Constipation, (no bowel movements for 3 days) if this happens: o Take Milk of Magnesia, 2 tablespoons by mouth, 3 times a day for 2 days if needed o Stop taking Milk of Magnesia once you have had a bowel movement o Call your doctor if constipation continues Or o Take Miralax  (instead of Milk of Magnesia) following the label instructions o Stop taking Miralax once you have had a bowel movement o Call your doctor if constipation continues  Anything you think is abnormal for you   Normal side effects after surgery:  Unable to sleep at night or unable to concentrate  Irritability  Being tearful (crying) or depressed  These are common complaints, possibly related to your anesthesia, stress of surgery, and change in lifestyle, that usually go away a few weeks after surgery. If these feelings continue, call your medical doctor.  Wound Care: You may have surgical glue, steri-strips, or staples over your incisions after surgery  Surgical glue: Looks  like clear film over your incisions and will wear off a little at a time  Steri-strips: Adhesive strips of tape over your incisions. You may notice a yellowish color on skin under the steri-strips. This is used to make the steri-strips stick better. Do not pull the steri-strips off - let them fall off  Staples: Staples may be removed before you leave the hospital o If you go home with staples, call [facility] for an appointment with your surgeons nurse to have staples removed 10 days after surgery, [phone]  Showering: You may shower two (2) days after your surgery unless your surgeon tells you differently o Wash gently around incisions with warm soapy water, rinse well, and gently pat dry o If you have a drain (tube from your incision), you may need someone to hold this while you shower o No tub baths until staples are removed and incisions are healed   Medications:  Medications should be liquid or crushed if larger than the size of a dime  Extended release pills (medication that releases a little bit at a time through the  day) should not be crushed  Depending on the size and number of medications you take, you may need to space (take a few throughout the day)/change the time you take your medications so that you do not over-fill your pouch (smaller stomach)  Make sure you follow-up with you primary care  physician to make medication changes needed during rapid weight loss and life -style changes  If you have diabetes, follow up with your doctor that orders your diabetes medication(s) within one week after surgery and check your blood sugar regularly   Do not drive while taking narcotics (pain medications)   Do not take acetaminophen (Tylenol) and Roxicet or Lortab Elixir at the same time since these pain medications contain acetaminophen   Diet:  First 2 Weeks  Can resume your previous hospital diet but remember good eating techniques   Fluid Goal  The first  goal is to drink at least 8 ounces of protein shake/drink per day (or as directed by the nutritionist); some examples of protein shakes are Syntrax, Nectar, Dillard[employer], EAS Edge HP, and Unjury. - See handout from pre-op Bariatric Education Class: o Slowly increase the amount of protein shake you drink as tolerated o You may find it easier to slowly sip shakes throughout the day o It is important to get your proteins in first  Your fluid goal is to drink 64-100 ounces of fluid daily o It may take a few weeks to build up to this   32 oz. (or more) should be full liquids (see below for examples)  Liquids should not contain sugar, caffeine, or carbonation         Full Liquids:                   Protein Shakes/Drinks + 2 choices per day of other full liquids  Full liquids must be: o No More Than 12 grams of Carbs per serving o No More Than 3 grams of Fat per serving  Strained low-fat cream soup  Non-Fat milk  Fat-free Lactaid Milk  Sugar-free yogurt (Dannon Lite & Fit, Greek yogurt)   Vitamins and Minerals  Start 1 day after surgery unless otherwise directed by your surgeon  1 Chewable Multivitamin / Multimineral Supplement with iron (i.e. Centrum for Adults)  Chewable Calcium Citrate with Vitamin D-3 (Example: 3 Chewable Calcium  Plus 600 with vitamin D-3) o Take 500 mg three (3) times a day for a total of 1500 mg per day o Do not take all 3 doses of calcium at one time as it may cause constipation, and you can only absorb 500 mg at a time o Do not mix multivitamins containing iron with calcium supplements;  take 2 hours apart o Do not substitute Tums (calcium carbonate) for your calcium  Menstruating women and those at risk for anemia ( a blood disease that causes weakness) may need extra iron o Talk to your doctor to see if you need more iron  If you need extra iron: total daily iron recommendation (including Vitamins) is 50 to 100 mg Iron/day  Do not stop taking  or change any vitamins or minerals until you talk to your nutritionist or surgeon  Your nutritionist and/or surgeon must approve all vitamin and mineral supplements  Activity and Exercise: It is important to continue walking at home. Limit your physical activity as instructed by your doctor. During this time, use these guidelines:  Do not lift anything greater than ten  (10) pounds for at least two (2) weeks  Do not go back to work or drive until Designer, industrial/product says you can  You may have sex when you feel comfortable o It is VERY important for female patients to use a reliable birth control method; fertility often increase after surgery o Do not get pregnant for  at least 18 months  Start exercising as soon as your doctor tells you that you can o Make sure your doctor approves any physical activity  Start with a simple walking program  Walk 5-15 minutes each day, 7 days per week  Slowly increase until you are walking 30-45 minutes per day  Consider joining our BELT program. [phone] or email [email or web address] [email or web address]    Special Instructions  Things to remember:  Free counseling is available for you and your family through collaboration between [facility] and [facility]. Please call [phone] and leave a message  Use your CPAP when sleeping if this applies to you  Consider buying a medical alert bracelet that says you had lap-band surgery  You will likely have your first fill (fluid added to your band) 6 - 8 weeks after surgery  [facility] has a free Bariatric Surgery Support Group that meets monthly, the 3rd Thursday, 6pm. Calvert Cantor. You can see classes online at [email or web address]  It is very important to keep all follow up appointments with your surgeon, nutritionist, primary care physician, and behavioral health practitioner o After the first year, please follow up with your bariatric surgeon and nutritionist at least once a year in order  to maintain best weight loss results                    [facility]:  [phone]               [facility]: [phone]               Bariatric Nurse Coordinator: [phone]      Adjustable Gastric Band Home Care Instructions  Rev. 02/2012                                                                  Reviewed and Endorsed                                                   by [facility] Patient Education Committee, Jan, 2014

## 2014-05-01 NOTE — Discharge Summary (Signed)
Physician Discharge Summary  HONG TIMM NWG:[id number] DOB: 04-25-1975 DOA: 04/29/2014  PCP: PROVIDER NOT IN SYSTEM  Admit date: 04/29/2014 Discharge date: 05/01/2014  Recommendations for Outpatient Follow-up:  1.   Follow-up Information    Follow up with HOXWORTH,BENJAMIN T, MD. Schedule an appointment as soon as possible for a visit in 1 week.   Specialty:  General Surgery   Why:  For wound re-check   Contact information:   [address] [place] [address] [phone]      Discharge Diagnoses:  1. H/o Laparoscopic adjustable gastric banding 2009 with revision 2015 2. Infected LapBand port site with cellulitis 3. GERD 4. Obesity  Surgical Procedure: Diagnostic laparoscopy, incision and debridement of skin and subcutaneous tissue at infected lapband port site with removal of lapband port  Discharge Condition: good Disposition: home  Diet recommendation: regular diet using bariatric eating techniques  Filed Weights   04/29/14 2330  Weight: 115.667 kg (255 lb)    History of present illness: The patient is a 39 year old female s/p LapBand revision who has been having cellulitis around her port site since beginning of the month who presents to the ED now with purulent drainage from port site. She underwent laparoscopic adjustable gastric band surgery in 2009 in [place]. She underwent revision of her lap band by Dr. Johna Sheriff in May 2014 due to a slip. We last saw her in the office on April 12 2014 prior to that it was in August 25, 2013.   Her weight at that time in August was 244 pounds. She had a small fill at that time. She states that she had been doing well until beginning of April when she noticed some swelling and redness around her supraumbilical incision were port is. She believes her port has migrated in location. She states that the redness on her scan has worsened since last week. It is now progressing. She had from [place] on her  abdominal wall for the redness had stopped and it has extended past that. She denies any fevers or chills. She had extensive cellulitis and started her on doxycycline. Sent her for CT which showed soft tissue infection around port but no intra-abominal component. No evidence of slip. She underwent EGD by Dr Ezzard Standing late this week to r/o band erosion- no evidence of erosion. She was in process of getting set up for band port removal however she started draining pus from her port site yesterday and came to ED today. She reports some nausea and 1-2 episodes of regurgitation/emesis.    Hospital Course:  Admitted for IV abx and removal of lap band port. She had allergic reaction to vancomycin with rash, lip swelling which resolved after stoppping the vancomycin. Went to OR Sunday morning for removal of infected port and diagnostic laparoscopy, her lap band was drained as well.  Please see op note. POD 1 she was doing well. Tolerating a diet. Had been shown wound care. Ambulated in hall. Pain controlled. Pt has pain contract and will see pain physician for addl pain medications. Will provide a note stating she will need some additional pain med. Discussed wound care and dc instructions.   BP 116/71 mmHg  Pulse 56  Temp(Src) 97.9 F (36.6 C) (Oral)  Resp 8  Ht 5\' 3"  (1.6 m)  Wt 115.667 kg (255 lb)  BMI 45.18 kg/m2  SpO2 99%  LMP 04/27/2014 Alert, nad Soft, nd, nt. Incisions c/d/i; open wound - no cellulitis, no bleeding.  Discharge Instructions  Discharge Instructions    Call MD for:  difficulty breathing, headache or visual disturbances    Complete by:  As directed      Call MD for:  persistant dizziness or light-headedness    Complete by:  As directed      Call MD for:  persistant nausea and vomiting    Complete by:  As directed      Call MD for:  redness, tenderness, or signs of infection (pain, swelling, redness, odor or green/yellow discharge around incision site)    Complete by:  As  directed      Call MD for:  severe uncontrolled pain    Complete by:  As directed      Call MD for:    Complete by:  As directed   Call for Temp >101     Discharge instructions    Complete by:  As directed   Follow a LapBand diet -  Eating techniques 20-20-20 (30-30-30) 20 chews, 20 seconds between bites of food, 20 minutes to eat; sometimes you may need 30 chews, 30 seconds etc Use your nondominant hand to eat with Put fork down between bites of food Use a timer after swallowing to reinforce waiting 20-30 sec between bites of food Use a child/infant size utensil Try not to eat while watching TV  Perform wound care as instructed.   Abscess Care After An abscess (also called a boil or furuncle) is an infected area that contains a collection of pus. Signs and symptoms of an abscess include pain, tenderness, redness, or hardness, or you may feel a moveable soft area under your skin. An abscess can occur anywhere in the body. The infection may spread to surrounding tissues causing cellulitis. A cut (incision) by the surgeon was made over your abscess and the pus was drained out. Gauze may have been packed into the space to provide a drain that will allow the cavity to heal from the inside outwards. The boil may be painful for 5 to 7 days. Most people with a boil do not have high fevers. Your abscess, if seen early, may not have localized, and may not have been lanced. If not, another appointment may be required for this if it does not get better on its own or with medications.  HOME CARE INSTRUCTIONS  Only take over-the-counter or prescription medicines for pain, discomfort, or fever as directed by your caregiver.  When you bathe, soak and then remove gauze or iodoform packs. You may then wash the wound gently with mild soapy water. Repack the wound with a gauze using a qtip. Cover with gauze or do as your caregiver directs.   SEEK IMMEDIATE MEDICAL CARE IF:  You develop increased pain,  swelling, redness, drainage, or bleeding in the wound site.  You develop signs of generalized infection including muscle aches, chills, fever, or a general ill feeling.  An oral temperature above 102 F (38.9 C) develops, not controlled by medication.  See your caregiver for a recheck if you develop any of the symptoms described above. If medications (antibiotics) were prescribed, take them as directed.     Increase activity slowly    Complete by:  As directed             Medication List    STOP taking these medications        diclofenac 75 MG EC tablet  Commonly known as:  VOLTAREN      TAKE these medications  albuterol (2.5 MG/3ML) 0.083% nebulizer solution  Commonly known as:  PROVENTIL  Take 2.5 mg by nebulization every 6 (six) hours as needed for wheezing or shortness of breath.     cetirizine 10 MG tablet  Commonly known as:  ZYRTEC  Take 10 mg by mouth daily as needed for allergies (allergies).     clonazePAM 0.5 MG tablet  Commonly known as:  KLONOPIN  Take 0.5 mg by mouth 3 (three) times daily as needed for anxiety (anxiety).     famotidine 20 MG tablet  Commonly known as:  PEPCID  Take 1 tablet (20 mg total) by mouth 2 (two) times daily.     HYDROcodone-acetaminophen 10-325 MG per tablet  Commonly known as:  NORCO  Take 1 tablet by mouth 4 (four) times daily.     multivitamin with minerals Tabs tablet  Take 1 tablet by mouth daily.     sertraline 50 MG tablet  Commonly known as:  ZOLOFT  Take 50 mg by mouth daily.     sulfamethoxazole-trimethoprim 800-160 MG per tablet  Commonly known as:  BACTRIM DS,SEPTRA DS  Take 1 tablet by mouth 2 (two) times daily.           Follow-up Information    Follow up with HOXWORTH,BENJAMIN T, MD. Schedule an appointment as soon as possible for a visit in 1 week.   Specialty:  General Surgery   Why:  For wound re-check   Contact information:   [address] [place] [address] [phone]         The results of significant diagnostics from this hospitalization (including imaging, microbiology, ancillary and laboratory) are listed below for reference.    Significant Diagnostic Studies: Dg Chest 2 View  04/19/2014   CLINICAL DATA:  Postoperative chest pain since Friday.  EXAM: CHEST  2 VIEW  COMPARISON:  11/25/2010  FINDINGS: The heart size and mediastinal contours are within normal limits. Both lungs are clear. The visualized skeletal structures are unremarkable.  IMPRESSION: No acute cardiopulmonary findings.   Electronically Signed   By: Rudie MeyerP.  Gallerani M.D.   On: 04/19/2014 19:12   Dg Elbow Complete Right  04/29/2014   CLINICAL DATA:  Posterior elbow pain with extension, trauma 1 week ago  EXAM: RIGHT ELBOW - COMPLETE 3+ VIEW  COMPARISON:  None.  FINDINGS: There is no evidence of fracture, dislocation, or joint effusion. There is no evidence of arthropathy or other focal bone abnormality. Soft tissues are unremarkable. Apparent incomplete fusion of medial humeral condyle ossification center with well-defined sclerotic margins and no surrounding obscuration of fat planes or soft tissue abnormality to suggest avulsion fracture.  IMPRESSION: No acute osseous abnormality. Probable normal variant incomplete fusion of medial humeral condyle ossification center, less likely avulsion fracture unless the patient is specifically point tender directly at that area.   Electronically Signed   By: Christiana PellantGretchen  Green M.D.   On: 04/29/2014 19:24   Ct Abdomen Pelvis W Contrast  04/14/2014   CLINICAL DATA:  39 year old female with swelling and pain along lab and port site. Patient with original lap band surgery in 2009 with adjustment in 2014. Currently on antibiotics.  EXAM: CT ABDOMEN AND PELVIS WITH CONTRAST  TECHNIQUE: Multidetector CT imaging of the abdomen and pelvis was performed using the standard protocol following bolus administration of intravenous contrast.  CONTRAST:  125 cc intravenous Isovue-300   COMPARISON:  05/16/2012 CT  FINDINGS: Lower chest:  Unremarkable  Hepatobiliary: The liver and gallbladder are  unremarkable. There is no evidence of biliary dilatation.  Pancreas: Unremarkable  Spleen: Unremarkable  Adrenals/Urinary Tract: The kidneys, adrenal glands and bladder are unremarkable.  Stomach/Bowel: A Lap band is noted in a 2-8 o'clock orientation. The stomach is unremarkable without definite evidence of slip or hernia.  There is moderate inflammation along the lap band port within the anterior left subcutaneous tissues. Mild overlying skin thickening is noted. There is minimal inflammation along the proximal catheter deep to the abdominal wall.  It is difficult to determine if there is fluid along the port secondary to artifact.  No intra-abdominal abscess is identified.  a  There is no evidence of bowel obstruction or pneumoperitoneum. No focal bowel wall thickening is identified.  Vascular/Lymphatic: No enlarged lymph nodes or abdominal aortic aneurysm.  Reproductive: Uterus and adnexal regions are unremarkable.  Other: A small amount of free pelvic fluid is noted and may be physiologic.  Musculoskeletal: No acute or suspicious abnormalities noted. Degenerative changes within the lumbar spine are present.  IMPRESSION: Moderate inflammation surrounding the lap band port in the anterior left subcutaneous tissues and minimal inflammation along the proximal catheter within the abdomen, compatible with infection. It is difficult to determine if there is a small amount of fluid adjacent to the port due to artifact.  The remainder of the lap band is unremarkable.  Small amount of free pelvic fluid -may be physiologic.   Electronically Signed   By: Harmon Pier M.D.   On: 04/14/2014 15:32    Microbiology: Recent Results (from the past 240 hour(s))  Surgical pcr screen     Status: None   Collection Time: 04/30/14 12:58 AM  Result Value Ref Range Status   MRSA, PCR NEGATIVE NEGATIVE Final    Staphylococcus aureus NEGATIVE NEGATIVE Final    Comment:        The Xpert SA Assay (FDA approved for NASAL specimens in patients over 72 years of age), is one component of a comprehensive surveillance program.  Test performance has been validated by [facility] for patients greater than or equal to 47 year old. It is not intended to diagnose infection nor to guide or monitor treatment.      Labs: Basic Metabolic Panel:  Recent Labs Lab 04/30/14 0731  NA 138  K 3.5  CL 107  CO2 27  GLUCOSE 98  BUN 7  CREATININE 0.59  CALCIUM 8.1*   Liver Function Tests: No results for input(s): AST, ALT, ALKPHOS, BILITOT, PROT, ALBUMIN in the last 168 hours. No results for input(s): LIPASE, AMYLASE in the last 168 hours. No results for input(s): AMMONIA in the last 168 hours. CBC:  Recent Labs Lab 04/29/14 2000  WBC 7.4  NEUTROABS 4.9  HGB 11.8*  HCT 36.6  MCV 86.5  PLT 191   Cardiac Enzymes: No results for input(s): CKTOTAL, CKMB, CKMBINDEX, TROPONINI in the last 168 hours. BNP: BNP (last 3 results) No results for input(s): BNP in the last 8760 hours.  ProBNP (last 3 results) No results for input(s): PROBNP in the last 8760 hours.  CBG: No results for input(s): GLUCAP in the last 168 hours.  Active Problems:   Abdominal wall abscess   Time coordinating discharge: 15 minutes  Signed:  Atilano Ina, MD [facility], [place] [phone] 05/01/2014, 9:33 AM

## 2014-05-01 NOTE — Progress Notes (Signed)
Key Points: Use following P&T approved IV to PO non-antibiotic change policy.  Description contains the criteria that are approved Note: Policy Excludes:  Esophagectomy patientsPHARMACIST - PHYSICIAN COMMUNICATION DR:   Andrey CampanileWilson CONCERNING: IV to Oral Route Change Policy  RECOMMENDATION: This patient is receiving Protonix by the intravenous route.  Based on criteria approved by the Pharmacy and Therapeutics Committee, the intravenous medication(s) is/are being converted to the equivalent oral dose form(s).   DESCRIPTION: These criteria include:  The patient is eating (either orally or via tube) and/or has been taking other orally administered medications for a least 24 hours  The patient has no evidence of active gastrointestinal bleeding or impaired GI absorption (gastrectomy, short bowel, patient on TNA or NPO).  If you have questions about this conversion, please contact the Pharmacy Department  []   [phone]( [phone] )  [facility] []   [phone]( [phone] )  [facility]  []   [phone]( [phone] )  [facility] [x]   [phone]( [phone] )  [facility]  Earl ManyLegge, Berania Peedin [place], [facility] 05/01/2014 8:42 AM

## 2014-08-03 ENCOUNTER — Other Ambulatory Visit: Payer: Self-pay | Admitting: General Surgery

## 2014-09-26 ENCOUNTER — Other Ambulatory Visit (HOSPITAL_COMMUNITY): Payer: Self-pay | Admitting: *Deleted

## 2014-09-26 NOTE — Progress Notes (Signed)
Chest xray 2 view 04-19-14 epic ekg 04-19-14 epic

## 2014-09-26 NOTE — Patient Instructions (Addendum)
Samantha Doyle  09/26/2014   Your procedure is scheduled on: Monday September 26th, 2016  Report to [facility]  Entrance take [place]  elevators to 3rd floor to  Short Stay Center at 530 AM.  Call this number if you have problems the morning of surgery [phone]   Remember: ONLY 1 PERSON MAY GO WITH YOU TO SHORT STAY TO GET  READY MORNING OF YOUR SURGERY.  Do not eat food or drink liquids :After Midnight.     Take these medicines the morning of surgery with A SIP OF WATER: none                               You may not have any metal on your body including hair pins and              piercings  Do not wear jewelry, make-up, lotions, powders or perfumes, deodorant             Do not wear nail polish.  Do not shave  48 hours prior to surgery.              Men may shave face and neck.   Do not bring valuables to the hospital. [facility] IS NOT             RESPONSIBLE   FOR VALUABLES.  Contacts, dentures or bridgework may not be worn into surgery.  Leave suitcase in the car. After surgery it may be brought to your room.     Patients discharged the day of surgery will not be allowed to drive home.  Name and phone number of your driver:charles Osei father cell [phone]  Special Instructions: N/A              Please read over the following fact sheets you were given: _____________________________________________________________________             [facility] - Preparing for Surgery Before surgery, you can play an important role.  Because skin is not sterile, your skin needs to be as free of germs as possible.  You can reduce the number of germs on your skin by washing with CHG (chlorahexidine gluconate) soap before surgery.  CHG is an antiseptic cleaner which kills germs and bonds with the skin to continue killing germs even after washing. Please DO NOT use if you have an allergy to CHG or antibacterial soaps.  If your skin becomes  reddened/irritated stop using the CHG and inform your nurse when you arrive at Short Stay. Do not shave (including legs and underarms) for at least 48 hours prior to the first CHG shower.  You may shave your face/neck. Please follow these instructions carefully:  1.  Shower with CHG Soap the night before surgery and the  morning of Surgery.  2.  If you choose to wash your hair, wash your hair first as usual with your  normal  shampoo.  3.  After you shampoo, rinse your hair and body thoroughly to remove the  shampoo.                           4.  Use CHG as you would any other liquid soap.  You can apply chg directly  to the skin and wash  Gently with a scrungie or clean washcloth.  5.  Apply the CHG Soap to your body ONLY FROM THE NECK DOWN.   Do not use on face/ open                           Wound or open sores. Avoid contact with eyes, ears mouth and genitals (private parts).                       Wash face,  Genitals (private parts) with your normal soap.             6.  Wash thoroughly, paying special attention to the area where your surgery  will be performed.  7.  Thoroughly rinse your body with warm water from the neck down.  8.  DO NOT shower/wash with your normal soap after using and rinsing off  the CHG Soap.                9.  Pat yourself dry with a clean towel.            10.  Wear clean pajamas.            11.  Place clean sheets on your bed the night of your first shower and do not  sleep with pets. Day of Surgery : Do not apply any lotions/deodorants the morning of surgery.  Please wear clean clothes to the hospital/surgery center.  FAILURE TO FOLLOW THESE INSTRUCTIONS MAY RESULT IN THE CANCELLATION OF YOUR SURGERY PATIENT SIGNATURE_________________________________  NURSE SIGNATURE__________________________________  ________________________________________________________________________

## 2014-09-28 ENCOUNTER — Encounter (HOSPITAL_COMMUNITY): Payer: Self-pay

## 2014-09-28 ENCOUNTER — Encounter (HOSPITAL_COMMUNITY)
Admission: RE | Admit: 2014-09-28 | Discharge: 2014-09-28 | Disposition: A | Discharge status: Home / Self Care | Payer: BLUE CROSS/BLUE SHIELD | Source: Ambulatory Visit | Attending: General Surgery | Admitting: General Surgery

## 2014-09-28 DIAGNOSIS — Z01818 Encounter for other preprocedural examination: Secondary | ICD-10-CM | POA: Insufficient documentation

## 2014-09-28 HISTORY — DX: Carpal tunnel syndrome, unspecified upper limb: G56.00

## 2014-09-28 HISTORY — DX: Unspecified chronic bronchitis: J42

## 2014-09-28 LAB — BASIC METABOLIC PANEL
ANION GAP: 7 (ref 5–15)
BUN: 12 mg/dL (ref 6–20)
CO2: 29 mmol/L (ref 22–32)
Calcium: 9.1 mg/dL (ref 8.9–10.3)
Chloride: 102 mmol/L (ref 101–111)
Creatinine, Ser: 0.47 mg/dL (ref 0.44–1.00)
GFR calc Af Amer: >60 mL/min (ref >60)
GLUCOSE: 81 mg/dL (ref 65–99)
POTASSIUM: 4.2 mmol/L (ref 3.5–5.1)
Sodium: 138 mmol/L (ref 135–145)

## 2014-09-28 LAB — CBC
HEMATOCRIT: 39 % (ref 36.0–46.0)
HEMOGLOBIN: 12.2 g/dL (ref 12.0–15.0)
MCH: 26.8 pg (ref 26.0–34.0)
MCHC: 31.3 g/dL (ref 30.0–36.0)
MCV: 85.5 fL (ref 78.0–100.0)
Platelets: 226 10*3/uL (ref 150–400)
RBC: 4.56 MIL/uL (ref 3.87–5.11)
RDW: 15.2 % (ref 11.5–15.5)
WBC: 9.2 10*3/uL (ref 4.0–10.5)

## 2014-09-28 LAB — HCG, SERUM, QUALITATIVE: Preg, Serum: NEGATIVE

## 2014-10-01 MED ORDER — DEXTROSE 5 % IV SOLN
3.0000 g | INTRAVENOUS | Status: AC
  Administered 2014-10-02: 3 g via INTRAVENOUS
  Filled 2014-10-01 (×2): qty 3000

## 2014-10-01 NOTE — Anesthesia Preprocedure Evaluation (Addendum)
Anesthesia Evaluation  Patient identified by MRN, date of birth, ID band Patient awake    Reviewed: Allergy & Precautions, H&P , NPO status , Patient's Chart, lab work & pertinent test results  Airway Mallampati: I  TM Distance: >3 FB Neck ROM: Full    Dental  (+) Chipped, Missing, Dental Advisory Given   Pulmonary Current Smoker,  bronchitis   Pulmonary exam normal breath sounds clear to auscultation       Cardiovascular hypertension, Normal cardiovascular exam Rhythm:Regular Rate:Normal     Neuro/Psych  Headaches, Depression    GI/Hepatic negative GI ROS, Neg liver ROS,   Endo/Other  negative endocrine ROSMorbid obesity  Renal/GU negative Renal ROS  negative genitourinary   Musculoskeletal negative musculoskeletal ROS (+)   Abdominal (+) + obese,   Peds negative pediatric ROS (+)  Hematology negative hematology ROS (+)   Anesthesia Other Findings   Reproductive/Obstetrics negative OB ROS                          Anesthesia Physical  Anesthesia Plan  ASA: III  Anesthesia Plan: General   Post-op Pain Management:    Induction: Intravenous  Airway Management Planned: Oral ETT  Additional Equipment:   Intra-op Plan:   Post-operative Plan: Extubation in OR  Informed Consent: I have reviewed the patients History and Physical, chart, labs and discussed the procedure including the risks, benefits and alternatives for the proposed anesthesia with the patient or authorized representative who has indicated his/her understanding and acceptance.   Dental advisory given  Plan Discussed with: Surgeon, Anesthesiologist and CRNA  Anesthesia Plan Comments: (Previous Grade 1 intubation and easy mask recorded)        Anesthesia Quick Evaluation

## 2014-10-01 NOTE — H&P (Signed)
  History of Present Illness Sharlet Salina T. Handsome Anglin MD; 08/03/2014 10:49 AM) The patient is a 39 year old female is here for lapband followup. She returns for further follow-up after removal of her infected port with a history of remote lap band placement out of town. I had previously repaired a slip of her lap band. At that point she had lost 200 pounds but a lot of it was due to the complication and she has gained 100 pounds back since repair of the slip. She reports her wound is completely healed. She is feeling well. She is ready to have her port replaced and get back on track in terms of her weight.   Allergies Fay Records, CMA; 08/03/2014 10:28 AM) Demerol *ANALGESICS - OPIOID* Hives, Nausea, Vomiting. MONOSODIUM GLUTAMATE Hives.  Medication History Fay Records, CMA; 08/03/2014 10:29 AM) Leandro Reasoner (10-325MG  Tablet, Oral four times daily) Active. Magnesium Citrate Active. Calcium 600 (1500 (600 Ca)MG Tablet, Oral) Active. B6 Natural (  Tablet, Oral) Active. Medications Reconciled  Vitals Fay Records CMA; 08/03/2014 10:29 AM) 08/03/2014 10:29 AM Weight: 267.2 lb Height: 63in Body Surface Area: 2.32 m Body Mass Index: 47.33 kg/m Temp.: 98.61F(Oral)  Pulse: 70 (Regular)  BP: 130/76 (Sitting, Left Arm, Standard)    Physical Exam Sharlet Salina T. Kennley Schwandt MD; 08/03/2014 10:50 AM) The physical exam findings are as follows: Note:General: Alert, obese Caucasian female, in no distress Skin: Warm and dry without rash or infection. HEENT: No palpable masses or thyromegaly. Sclera nonicteric. Pupils equal round and reactive. Oropharynx clear. Lungs: Breath sounds clear and equal. No wheezing or increased work of breathing. Cardiovascular: Regular rate and rhythm without murmer. No JVD or edema. Peripheral pulses intact. No carotid bruits. Abdomen: Nondistended. Soft and nontender. No masses palpable. Previous port site wound and left mid abdomen is completely healed  without evidence of infection Extremities: No edema or joint swelling or deformity. No chronic venous stasis changes. Neurologic: Alert and fully oriented. Gait normal. No focal weakness. Psychiatric: Normal mood and affect. Thought content appropriate with normal judgement and insight    Assessment & Plan Sharlet Salina T. Selinda Korzeniewski MD; 08/03/2014 10:51 AM) LAP-BAND SURGERY STATUS (V45.86  Z98.84) Impression: Status post removal of infected LAP-BAND port. History of excellent weight loss previously. No evidence of erosion or other complications from her band. She feels at this point she would like to continue to work with her band and have the port replaced. We will schedule her for laparoscopy and placement of lap band port. I discussed the procedure in detail including its nature and recovery as well as risks of anesthetic complications, bleeding, infection or rare risk of visceral injury. She understands and agrees to proceed. Current Plans  Schedule for Surgery Laparoscopy and placement of lap band port under general anesthesia as an outpatient

## 2014-10-02 ENCOUNTER — Encounter (HOSPITAL_COMMUNITY)
Admission: RE | Disposition: A | Discharge status: Home / Self Care | Payer: Self-pay | Source: Ambulatory Visit | Attending: General Surgery

## 2014-10-02 ENCOUNTER — Ambulatory Visit (HOSPITAL_COMMUNITY): Payer: BLUE CROSS/BLUE SHIELD | Admitting: Anesthesiology

## 2014-10-02 ENCOUNTER — Encounter (HOSPITAL_COMMUNITY): Payer: Self-pay | Admitting: *Deleted

## 2014-10-02 ENCOUNTER — Ambulatory Visit (HOSPITAL_COMMUNITY)
Admission: RE | Admit: 2014-10-02 | Discharge: 2014-10-02 | Disposition: A | Discharge status: Home / Self Care | Payer: BLUE CROSS/BLUE SHIELD | Source: Ambulatory Visit | Attending: General Surgery | Admitting: General Surgery

## 2014-10-02 DIAGNOSIS — T85518D Breakdown (mechanical) of other gastrointestinal prosthetic devices, implants and grafts, subsequent encounter: Secondary | ICD-10-CM

## 2014-10-02 DIAGNOSIS — Z4651 Encounter for fitting and adjustment of gastric lap band: Secondary | ICD-10-CM | POA: Diagnosis not present

## 2014-10-02 DIAGNOSIS — Z79891 Long term (current) use of opiate analgesic: Secondary | ICD-10-CM | POA: Diagnosis not present

## 2014-10-02 DIAGNOSIS — Z79899 Other long term (current) drug therapy: Secondary | ICD-10-CM | POA: Diagnosis not present

## 2014-10-02 DIAGNOSIS — F17200 Nicotine dependence, unspecified, uncomplicated: Secondary | ICD-10-CM | POA: Diagnosis not present

## 2014-10-02 DIAGNOSIS — Z9884 Bariatric surgery status: Secondary | ICD-10-CM | POA: Diagnosis not present

## 2014-10-02 DIAGNOSIS — Z6841 Body Mass Index (BMI) 40.0 and over, adult: Secondary | ICD-10-CM | POA: Insufficient documentation

## 2014-10-02 DIAGNOSIS — I10 Essential (primary) hypertension: Secondary | ICD-10-CM | POA: Diagnosis not present

## 2014-10-02 DIAGNOSIS — IMO0001 Reserved for inherently not codable concepts without codable children: Secondary | ICD-10-CM

## 2014-10-02 HISTORY — PX: GASTRIC BANDING PORT REVISION: SHX5246

## 2014-10-02 SURGERY — GASTRIC BANDING PORT REVISION
Anesthesia: General

## 2014-10-02 MED ORDER — PROPOFOL 10 MG/ML IV BOLUS
INTRAVENOUS | Status: AC
  Filled 2014-10-02: qty 20

## 2014-10-02 MED ORDER — ATROPINE SULFATE 0.4 MG/ML IJ SOLN
INTRAMUSCULAR | Status: AC
  Filled 2014-10-02: qty 2

## 2014-10-02 MED ORDER — FENTANYL CITRATE (PF) 250 MCG/5ML IJ SOLN
INTRAMUSCULAR | Status: AC
  Filled 2014-10-02: qty 25

## 2014-10-02 MED ORDER — SUCCINYLCHOLINE CHLORIDE 20 MG/ML IJ SOLN
INTRAMUSCULAR | Status: DC | PRN
  Administered 2014-10-02: 100 mg via INTRAVENOUS

## 2014-10-02 MED ORDER — SODIUM CHLORIDE 0.9 % IJ SOLN
INTRAMUSCULAR | Status: DC | PRN
  Administered 2014-10-02: 60 mL

## 2014-10-02 MED ORDER — GLYCOPYRROLATE 0.2 MG/ML IJ SOLN
INTRAMUSCULAR | Status: AC
  Filled 2014-10-02: qty 4

## 2014-10-02 MED ORDER — NEOSTIGMINE METHYLSULFATE 10 MG/10ML IV SOLN
INTRAVENOUS | Status: AC
  Filled 2014-10-02: qty 1

## 2014-10-02 MED ORDER — LIP MEDEX EX OINT
TOPICAL_OINTMENT | CUTANEOUS | Status: AC
  Filled 2014-10-02: qty 7

## 2014-10-02 MED ORDER — DEXAMETHASONE SODIUM PHOSPHATE 10 MG/ML IJ SOLN
INTRAMUSCULAR | Status: AC
  Filled 2014-10-02: qty 1

## 2014-10-02 MED ORDER — PROPOFOL 10 MG/ML IV BOLUS
INTRAVENOUS | Status: DC | PRN
  Administered 2014-10-02: 200 mg via INTRAVENOUS

## 2014-10-02 MED ORDER — ROCURONIUM BROMIDE 100 MG/10ML IV SOLN
INTRAVENOUS | Status: AC
  Filled 2014-10-02: qty 1

## 2014-10-02 MED ORDER — ROCURONIUM BROMIDE 100 MG/10ML IV SOLN
INTRAVENOUS | Status: DC | PRN
  Administered 2014-10-02: 30 mg via INTRAVENOUS

## 2014-10-02 MED ORDER — FENTANYL CITRATE (PF) 100 MCG/2ML IJ SOLN
INTRAMUSCULAR | Status: DC | PRN
  Administered 2014-10-02: 50 ug via INTRAVENOUS

## 2014-10-02 MED ORDER — BUPIVACAINE-EPINEPHRINE 0.5% -1:200000 IJ SOLN
INTRAMUSCULAR | Status: DC | PRN
  Administered 2014-10-02: 15 mL

## 2014-10-02 MED ORDER — SODIUM CHLORIDE 0.9 % IJ SOLN
INTRAMUSCULAR | Status: AC
  Filled 2014-10-02: qty 10

## 2014-10-02 MED ORDER — BUPIVACAINE-EPINEPHRINE (PF) 0.5% -1:200000 IJ SOLN
INTRAMUSCULAR | Status: AC
  Filled 2014-10-02: qty 30

## 2014-10-02 MED ORDER — DEXAMETHASONE SODIUM PHOSPHATE 10 MG/ML IJ SOLN
INTRAMUSCULAR | Status: DC | PRN
  Administered 2014-10-02: 10 mg via INTRAVENOUS

## 2014-10-02 MED ORDER — FENTANYL CITRATE (PF) 100 MCG/2ML IJ SOLN
25.0000 ug | INTRAMUSCULAR | Status: DC | PRN
  Administered 2014-10-02: 50 ug via INTRAVENOUS

## 2014-10-02 MED ORDER — LACTATED RINGERS IV SOLN
INTRAVENOUS | Status: DC | PRN
  Administered 2014-10-02: 07:00:00 via INTRAVENOUS

## 2014-10-02 MED ORDER — FENTANYL CITRATE (PF) 100 MCG/2ML IJ SOLN
INTRAMUSCULAR | Status: AC
  Filled 2014-10-02: qty 2

## 2014-10-02 MED ORDER — EPHEDRINE SULFATE 50 MG/ML IJ SOLN
INTRAMUSCULAR | Status: AC
  Filled 2014-10-02: qty 1

## 2014-10-02 MED ORDER — ONDANSETRON HCL 4 MG/2ML IJ SOLN
INTRAMUSCULAR | Status: AC
  Filled 2014-10-02: qty 2

## 2014-10-02 MED ORDER — HYDROCODONE-ACETAMINOPHEN 10-325 MG PO TABS: 1.0000 | ORAL_TABLET | ORAL | Status: DC | PRN

## 2014-10-02 MED ORDER — MIDAZOLAM HCL 5 MG/5ML IJ SOLN
INTRAMUSCULAR | Status: DC | PRN
  Administered 2014-10-02: 2 mg via INTRAVENOUS

## 2014-10-02 MED ORDER — ONDANSETRON HCL 4 MG/2ML IJ SOLN
INTRAMUSCULAR | Status: DC | PRN
  Administered 2014-10-02: 4 mg via INTRAVENOUS

## 2014-10-02 MED ORDER — LIDOCAINE HCL (CARDIAC) 20 MG/ML IV SOLN
INTRAVENOUS | Status: AC
  Filled 2014-10-02: qty 5

## 2014-10-02 MED ORDER — MIDAZOLAM HCL 2 MG/2ML IJ SOLN
INTRAMUSCULAR | Status: AC
  Filled 2014-10-02: qty 4

## 2014-10-02 MED ORDER — LIDOCAINE HCL (CARDIAC) 20 MG/ML IV SOLN
INTRAVENOUS | Status: DC | PRN
  Administered 2014-10-02: 100 mg via INTRAVENOUS

## 2014-10-02 MED ORDER — GLYCOPYRROLATE 0.2 MG/ML IJ SOLN
INTRAMUSCULAR | Status: DC | PRN
  Administered 2014-10-02: .8 mg via INTRAVENOUS

## 2014-10-02 MED ORDER — PROMETHAZINE HCL 25 MG/ML IJ SOLN: 6.2500 mg | INTRAMUSCULAR | Status: DC | PRN

## 2014-10-02 MED ORDER — NEOSTIGMINE METHYLSULFATE 10 MG/10ML IV SOLN
INTRAVENOUS | Status: DC | PRN
  Administered 2014-10-02: 5 mg via INTRAVENOUS

## 2014-10-02 MED ORDER — SODIUM CHLORIDE 0.9 % IJ SOLN
INTRAMUSCULAR | Status: AC
  Filled 2014-10-02: qty 50

## 2014-10-02 SURGICAL SUPPLY — 33 items
BENZOIN TINCTURE PRP APPL 2/3 (GAUZE/BANDAGES/DRESSINGS) IMPLANT
BLADE HEX COATED 2.75 (ELECTRODE) ×2 IMPLANT
CABLE HIGH FREQUENCY MONO STRZ (ELECTRODE) ×2 IMPLANT
CHLORAPREP W/TINT 26ML (MISCELLANEOUS) ×4 IMPLANT
COVER SURGICAL LIGHT HANDLE (MISCELLANEOUS) IMPLANT
DECANTER SPIKE VIAL GLASS SM (MISCELLANEOUS) ×2 IMPLANT
DRAPE LAPAROSCOPIC ABDOMINAL (DRAPES) ×2 IMPLANT
ELECT REM PT RETURN 9FT ADLT (ELECTROSURGICAL) ×2
ELECTRODE REM PT RTRN 9FT ADLT (ELECTROSURGICAL) ×1 IMPLANT
GLOVE BIOGEL PI IND STRL 7.0 (GLOVE) ×6 IMPLANT
GLOVE BIOGEL PI INDICATOR 7.0 (GLOVE) ×6
GOWN STRL REUS W/TWL LRG LVL3 (GOWN DISPOSABLE) ×2 IMPLANT
GOWN STRL REUS W/TWL XL LVL3 (GOWN DISPOSABLE) ×4 IMPLANT
KIT ACCESS PORT VG (Band) ×2 IMPLANT
KIT BASIN OR (CUSTOM PROCEDURE TRAY) ×2 IMPLANT
LIQUID BAND (GAUZE/BANDAGES/DRESSINGS) ×2 IMPLANT
MESH PROLENE 3X6 (Mesh General) ×2 IMPLANT
NEEDLE HYPO 22GX1.5 SAFETY (NEEDLE) ×2 IMPLANT
NS IRRIG 1000ML POUR BTL (IV SOLUTION) ×2 IMPLANT
PACK GENERAL/GYN (CUSTOM PROCEDURE TRAY) ×2 IMPLANT
SLEEVE XCEL OPT CAN 5 100 (ENDOMECHANICALS) ×2 IMPLANT
STAPLER VISISTAT 35W (STAPLE) IMPLANT
STRIP CLOSURE SKIN 1/2X4 (GAUZE/BANDAGES/DRESSINGS) IMPLANT
SUT MNCRL AB 4-0 PS2 18 (SUTURE) ×2 IMPLANT
SUT PROLENE 2 0 CT2 30 (SUTURE) ×4 IMPLANT
SUT VIC AB 2-0 SH 27 (SUTURE)
SUT VIC AB 2-0 SH 27X BRD (SUTURE) IMPLANT
SUT VIC AB 3-0 SH 27 (SUTURE) ×1
SUT VIC AB 3-0 SH 27X BRD (SUTURE) ×1 IMPLANT
SYR CONTROL 10ML LL (SYRINGE) ×4 IMPLANT
SYRINGE 10CC LL (SYRINGE) ×2 IMPLANT
TOWEL OR 17X26 10 PK STRL BLUE (TOWEL DISPOSABLE) ×2 IMPLANT
TROCAR BLADELESS OPT 5 100 (ENDOMECHANICALS) ×2 IMPLANT

## 2014-10-02 NOTE — Progress Notes (Signed)
Pt refused Pain med perscription, saying she was managed by the pain management clinic. Perscription discarded in shredder (witnessed by [employer], Charity fundraiser.)

## 2014-10-02 NOTE — Transfer of Care (Signed)
Immediate Anesthesia Transfer of Care Note  Patient: Samantha Doyle  Procedure(s) Performed: Procedure(s): LAPROSCOPIC PLACEMENT OF LAP BAND PORT (N/A)  Patient Location: PACU  Anesthesia Type:General  Level of Consciousness: awake, alert , oriented and patient cooperative  Airway & Oxygen Therapy: Patient Spontanous Breathing and Patient connected to face mask oxygen  Post-op Assessment: Report given to RN, Post -op Vital signs reviewed and stable and Patient moving all extremities  Post vital signs: Reviewed and stable  Last Vitals:  Filed Vitals:   10/02/14 0535  BP: 141/83  Pulse: 73  Temp: 37.1 C  Resp: 16    Complications: No apparent anesthesia complications

## 2014-10-02 NOTE — Anesthesia Procedure Notes (Signed)
Procedure Name: Intubation Date/Time: 10/02/2014 7:36 AM Performed by: Jarvis Newcomer A Pre-anesthesia Checklist: Patient identified, Emergency Drugs available, Suction available, Patient being monitored and Timeout performed Patient Re-evaluated:Patient Re-evaluated prior to inductionOxygen Delivery Method: Circle system utilized Preoxygenation: Pre-oxygenation with 100% oxygen Intubation Type: IV induction Ventilation: Mask ventilation without difficulty and Oral airway inserted - appropriate to patient size Laryngoscope Size: Mac and 4 Grade View: Grade I Tube type: Oral Tube size: 7.5 mm Number of attempts: 1 Airway Equipment and Method: Stylet Placement Confirmation: ETT inserted through vocal cords under direct vision,  positive ETCO2 and breath sounds checked- equal and bilateral Secured at: 23 cm Tube secured with: Tape Dental Injury: Teeth and Oropharynx as per pre-operative assessment

## 2014-10-02 NOTE — Op Note (Signed)
Preoperative Diagnosis: Morbid Obesity  Postoprative Diagnosis: Morbid Obesity  Procedure: Procedure(s): LAPROSCOPIC PLACEMENT OF LAP BAND PORT   Surgeon: Glenna Fellows T   Assistants: none  Anesthesia:  General endotracheal anesthesia  Indications: patient is a 39 year old female with a history of morbid obesity and status post placement of lap band out of town several years ago. She has had a previous slip replaced here and then the spring presented with an infection at her port site. Workup including upper endoscopy, upper GI series and diagnostic laparoscopy showed no evidence of erosion or other problem with her band. Her port was removed and the wound has completely healed. She desires replacement of her port as she has gained quite a bit of weight after it was removed. We discussed the nature of the procedure and its indications and risks detailed elsewhere in all her questions were answered.    Procedure Detail:  Patient was brought to the operating room, placed in the supine position on the operating table, and general endotracheal anesthesia induced. She received preoperative IV antibiotics. PAS weren't place. The abdomen was widely sterilely prepped and draped. Patient timeout was performed and correct procedure verified. The patient's old port site was in the left upper quadrant and I chose to place the new port in the right upper quadrant. Access was obtained with a 5 mm Optiview trocar after infiltrating with local anesthesia using the corner of her old port site incision the left upper quadrant. Pneumoperitoneum was established without difficulty and no evidence of trocar injury. Another 5 mm trocar was placed at one of the previous old incisions in the right upper quadrant right plan to place the new port. The lap band tubing was identified. There was no evidence of inflammation or problem around the tubing. It was traced back up to the subhepatic space. I could not expose the  band itself without further dissection but there was no evidence of inflammation or any abnormalities. The end of the tubing was identified and was brought out through the 5 mm trocar in the right upper quadrant. The tubing was trimmed and attached to a new flushed port to which I had sutured a piece of Prolene to the back with interrupted 2-0 Prolene sutures. This 5 mm trocar site incision was lengthened to allow placement of the port and a subtenon's pocket was created. The port and band was flushed several times with saline and I left 2.5 mL of saline in the band. The port was placed in a subcutaneous position just lateral and inferior to the incision. The tubing was seen to curve smoothly into the abdomen. The subcutaneous tissue was closed with interrupted 3-0 Vicryl and the skin with subcuticular 5-0 Monocryl and Dermabond. Sponge Isthmic counts were correct.    Findings: As above  Estimated Blood Loss:  Minimal         Drains: none  Blood Given: none          Specimens: none        Complications:  * No complications entered in OR log *         Disposition: PACU - hemodynamically stable.         Condition: stable

## 2014-10-02 NOTE — Discharge Instructions (Signed)
° °                ° °ADJUSTABLE GASTRIC BAND ° Home Care Instructions ° ° These instructions are to help you care for yourself when you go home. ° °Call: If you have any problems. °• Call [phone] and ask for the surgeon on call °• If you need immediate assistance come to the ER at [place]. Tell the ER staff you are a new post-op gastric banding patient  °Signs and symptoms to report: • Severe  vomiting or nausea °o If you cannot handle clear liquids for longer than 1 day, call your surgeon °• Abdominal pain which does not get better after taking your pain medication °• Fever greater than 100.4°  F and chills °• Heart rate over 100 beats a minute °• Trouble breathing °• Chest pain °• Redness,  swelling, drainage, or foul odor at incision (surgical) sites °• If your incisions open or pull apart °• Swelling or pain in calf (lower leg) °• Diarrhea (Loose bowel movements that happen often), frequent watery, uncontrolled bowel movements °• Constipation, (no bowel movements for 3 days) if this happens: °o Take Milk of Magnesia, 2 tablespoons by mouth, 3 times a day for 2 days if needed °o Stop taking Milk of Magnesia once you have had a bowel movement °o Call your doctor if constipation continues °Or °o Take Miralax  (instead of Milk of Magnesia) following the label instructions °o Stop taking Miralax once you have had a bowel movement °o Call your doctor if constipation continues °• Anything you think is “abnormal for you” °  °Normal side effects after surgery: • Unable to sleep at night or unable to concentrate °• Irritability °• Being tearful (crying) or depressed ° °These are common complaints, possibly related to your anesthesia, stress of surgery, and change in lifestyle, that usually go away a few weeks after surgery. If these feelings continue, call your medical doctor.  °Wound Care: You may have surgical glue, steri-strips, or staples over your incisions after surgery °• Surgical glue: Looks like clear  film over your incisions and will wear off a little at a time °• Steri-strips: Adhesive strips of tape over your incisions. You may notice a yellowish color on skin under the steri-strips. This is used to make the steri-strips stick better. Do not pull the steri-strips off - let them fall off °• Staples: Staples may be removed before you leave the hospital °o If you go home with staples, call [facility] for an appointment with your surgeon’s nurse to have staples removed 10 days after surgery, [phone] °• Showering: You may shower two (2) days after your surgery unless your surgeon tells you differently °o Wash gently around incisions with warm soapy water, rinse well, and gently pat dry °o If you have a drain (tube from your incision), you may need someone to hold this while you shower °o No tub baths until staples are removed and incisions are healed °  °Medications: • Medications should be liquid or crushed if larger than the size of a dime °• Extended release pills (medication that releases a little bit at a time through the  day) should not be crushed °• Depending on the size and number of medications you take, you may need to space (take a few throughout the day)/change the time you take your medications so that you do not over-fill your pouch (smaller stomach) °• Make sure you follow-up with you primary care physician   to make medication changes needed during rapid weight loss and life -style changes °• If you have diabetes, follow up with your doctor that orders your diabetes medication(s) within one week after surgery and check your blood sugar regularly ° °• Do not drive while taking narcotics (pain medications) ° °• Do not take acetaminophen (Tylenol) and Roxicet or Lortab Elixir at the same time since these pain medications contain acetaminophen °  °Diet:  °First 2 Weeks You will see the nutritionist about two (2) weeks after your surgery. The nutritionist will increase the types of  foods you can eat if you are handling liquids well: °• If you have severe vomiting or nausea and cannot handle clear liquids lasting longer than 1 day call your surgeon °For Same Day Surgery Discharge Patients: °• The day of surgery drink water only: 2 ounces every 4 hours °• If you are handling water, start drinking your high protein shake the next morning °For Overnight Stay Patients: °• Begin by drinking 2 ounces of a high protein every 3 hours, 5-6 times per day °• Slowly increase the amount you drink as tolerated °• You may find it easier to slowly sip shakes throughout the day. It is important to get your proteins in first °  ° Protein Shake °• Drink at least 2 ounces of shake 5-6 times per day °• Each serving of protein shakes (usually 8-12 ounces) should have a minimum of: °o 15 grams of protein °o And no more than 5 grams of carbohydrate °• Goal for protein each day: °o Men = 80 grams per day °o Women = 60 grams per day °• Protein powder may be added to fluids such as non-fat milk or Lactaid milk or Soy milk (limit to 35 grams added protein powder per serving) ° °Hydration °• Slowly increase the amount of water and other clear liquids as tolerated (See Acceptable Fluids) °• Slowly increase the amount of protein shake as tolerated °• Sip fluids slowly and throughout the day °• May use sugar substitutes in small amounts (no more than 6-8 packets per day; i.e. Splenda) ° °Fluid Goal °• The first goal is to drink at least 8 ounces of protein shake/drink per day (or as directed by the nutritionist); some examples of protein shakes are Syntrax, Nectar, Adkins Advantage, EAS Edge HP, and Unjury. - See handout from pre-op Bariatric Education Class: °o Slowly increase the amount of protein shake you drink as tolerated °o You may find it easier to slowly sip shakes throughout the day °o It is important to get your proteins in first °• Your fluid goal is to drink 64-100 ounces of fluid daily °o It may take a few weeks  to build up to this  °• 32 oz. (or more) should be full liquids (see below for examples) °• Liquids should not contain sugar, caffeine, or carbonation ° °Clear Liquids: °• Water of Sugar-free flavored water (i.e. Fruit H²O, Propel) °• Decaffeinated coffee or tea (sugar-free) °• Crystal lite, Wyler’s Lite, Minute Maid Lite °• Sugar-free Jell-O °• Bouillon or broth °• Sugar-free Popsicle:    - Less than 20 calories each; Limit 1 per day ° ° ° ° °  ° Full Liquids: °                  Protein Shakes/Drinks + 2 choices per day of other full liquids °• Full liquids must be: °o No More Than 12 grams of Carbs per serving °o No More Than 3 grams   of Fat per serving °• Strained low-fat cream soup °• Non-Fat milk °• Fat-free Lactaid Milk °• Sugar-free yogurt (Dannon Lite & Fit, Greek yogurt) °  °Vitamins and Minerals • Start 1 day after surgery unless otherwise directed by your surgeon °• 1 Chewable Multivitamin / Multimineral Supplement with iron (i.e. Centrum for Adults) °• Chewable Calcium Citrate with Vitamin D-3 °(Example: 3 Chewable Calcium  Plus 600 with vitamin D-3) °o Take 500 mg three (3) times a day for a total of 1500 mg per day °o Do not take all 3 doses of calcium at one time as it may cause constipation, and you can only absorb 500 mg at a time °o Do not mix multivitamins containing iron with calcium supplements;  take 2 hours apart °o Do not substitute Tums (calcium carbonate) for your calcium °• Menstruating women and those at risk for anemia ( a blood disease that causes weakness) may need extra iron °o Talk to your doctor to see if you need more iron °• If you need extra iron: total daily iron recommendation (including Vitamins) is 50 to 100 mg Iron/day °• Do not stop taking or change any vitamins or minerals until you talk to your nutritionist or surgeon °• Your nutritionist and/or surgeon must approve all vitamin and mineral supplements  °Activity and Exercise: It is important to continue walking at home.  Limit your physical activity as instructed by your doctor. During this time, use these guidelines: °• Do not lift anything greater than ten  (10) pounds for at least two (2) weeks °• Do not go back to work or drive until your surgeon says you can °• You may have sex when you feel comfortable °o It is VERY important for female patients to use a reliable birth control method; fertility often increase after surgery °o Do not get pregnant for at least 18 months °• Start exercising as soon as your doctor tells you that you can °o Make sure your doctor approves any physical activity °• Start with a simple walking program °• Walk 5-15 minutes each day, 7 days per week °• Slowly increase until you are walking 30-45 minutes per day °• Consider joining our BELT program. [phone] or email [email or web address] ° °  °Special Instructions  Things to remember: °• Free counseling is available for you and your family through collaboration between [facility] and [employer]. Please call [phone] and leave a message °• Use your CPAP when sleeping if this applies to you °• Consider buying a medical alert bracelet that says you had lap-band surgery °• You will likely have your first fill (fluid added to your band) 6 - 8 weeks after surgery °• [facility] has a free Bariatric Surgery Support Group that meets monthly, the 3rd Thursday, 6pm. [facility] Classrooms. You can see classes online at [email or web address] °• It is very important to keep all follow up appointments with your surgeon, nutritionist, primary care physician, and behavioral health practitioner °o After the first year, please follow up with your bariatric surgeon and nutritionist at least once a year in order to maintain best weight loss results °      °             [facility]:  [phone] ° °             [facility]: [phone] ° °             Bariatric Nurse Coordinator:   [phone] °  °   Adjustable Gastric Band Home Care Instructions  Rev. 02/2012                                                            ° °     Reviewed and Endorsed °                                                  by [facility] Patient Education Committee, Jan, 2014 ° °

## 2014-10-02 NOTE — Anesthesia Postprocedure Evaluation (Signed)
  Anesthesia Post-op Note  Patient: Samantha Doyle  Procedure(s) Performed: Procedure(s) (LRB): LAPROSCOPIC PLACEMENT OF LAP BAND PORT (N/A)  Patient Location: PACU  Anesthesia Type: General  Level of Consciousness: awake and alert   Airway and Oxygen Therapy: Patient Spontanous Breathing  Post-op Pain: mild  Post-op Assessment: Post-op Vital signs reviewed, Patient's Cardiovascular Status Stable, Respiratory Function Stable, Patent Airway and No signs of Nausea or vomiting  Last Vitals:  Filed Vitals:   10/02/14 0834  BP: 147/78  Pulse: 73  Temp: 36.9 C  Resp: 22    Post-op Vital Signs: stable   Complications: No apparent anesthesia complications

## 2014-10-02 NOTE — Interval H&P Note (Signed)
History and Physical Interval Note:  10/02/2014 7:10 AM  Samantha Doyle  has presented today for surgery, with the diagnosis of Morbid Obesity  The various methods of treatment have been discussed with the patient and family. After consideration of risks, benefits and other options for treatment, the patient has consented to  Procedure(s): GASTRIC PLACEMENT OF LAP BAND PORT (N/A) as a surgical intervention .  The patient's history has been reviewed, patient examined, no change in status, stable for surgery.  I have reviewed the patient's chart and labs.  Questions were answered to the patient's satisfaction.     HOXWORTH,BENJAMIN T

## 2014-10-17 ENCOUNTER — Encounter: Payer: Self-pay | Admitting: *Deleted

## 2014-10-17 ENCOUNTER — Emergency Department (INDEPENDENT_AMBULATORY_CARE_PROVIDER_SITE_OTHER)
Admission: EM | Admit: 2014-10-17 | Discharge: 2014-10-17 | Disposition: A | Discharge status: Home / Self Care | Payer: BLUE CROSS/BLUE SHIELD | Source: Home / Self Care | Attending: Family Medicine | Admitting: Family Medicine

## 2014-10-17 DIAGNOSIS — G43909 Migraine, unspecified, not intractable, without status migrainosus: Secondary | ICD-10-CM | POA: Diagnosis not present

## 2014-10-17 MED ORDER — KETOROLAC TROMETHAMINE 60 MG/2ML IM SOLN
60.0000 mg | Freq: Once | INTRAMUSCULAR | Status: AC
  Administered 2014-10-17: 60 mg via INTRAMUSCULAR

## 2014-10-17 MED ORDER — DEXAMETHASONE SODIUM PHOSPHATE 10 MG/ML IJ SOLN
10.0000 mg | Freq: Once | INTRAMUSCULAR | Status: AC
  Administered 2014-10-17: 10 mg via INTRAMUSCULAR

## 2014-10-17 MED ORDER — ONDANSETRON HCL 4 MG/2ML IJ SOLN
4.0000 mg | Freq: Once | INTRAMUSCULAR | Status: AC
  Administered 2014-10-17: 4 mg via INTRAMUSCULAR

## 2014-10-17 MED ORDER — METOCLOPRAMIDE HCL 5 MG/ML IJ SOLN
5.0000 mg | Freq: Once | INTRAMUSCULAR | Status: AC
  Administered 2014-10-17: 5 mg via INTRAMUSCULAR

## 2014-10-17 MED ORDER — ONDANSETRON 4 MG PO TBDP
4.0000 mg | ORAL_TABLET | Freq: Once | ORAL | Status: AC
  Administered 2014-10-17: 4 mg via ORAL

## 2014-10-17 MED ORDER — RIZATRIPTAN BENZOATE 5 MG PO TBDP: 5.0000 mg | ORAL_TABLET | ORAL | Status: DC | PRN

## 2014-10-17 NOTE — ED Notes (Addendum)
Pt c/o migraine x last night, nausea began at 0400. Reports hx of migraine. Had lap band port surgery 3 wks ago.

## 2014-10-17 NOTE — ED Provider Notes (Signed)
CSN: [id number]     Arrival date & time 10/17/14  1115 History   First MD Initiated Contact with Patient 10/17/14 1156     Chief Complaint  Patient presents with  . Migraine     HPI Comments: Patient has a history of migraine headaches that usually respond to Maxalt.  She developed a recurrent migraine last night that was triggered by MSG when she ate in a [employer].  Her migraine is typical:  Frontal area and sharp.  She has had nausea with vomiting at 4am.  Her last migraine was about 3 months ago. She requests a refill of Maxalt.  Patient is a 39 y.o. female presenting with migraines. The history is provided by the patient.  Migraine The current episode started yesterday. The problem occurs constantly. The problem has not changed since onset.Exacerbated by: light. Nothing relieves the symptoms. Treatments tried: hydrocodone. The treatment provided no relief.    Past Medical History  Diagnosis Date  . LAP-BAND surgery status   . Arthritis   . Cervical dysplasia   . Depression   . Chronic back pain   . Degenerative disc disease   . Spondylarthrosis   . Migraine   . Hypertension     off all bp meds x 3-4 years  . Chronic bronchitis (HCC)     hx of  . Carpal tunnel syndrome     right wrist, numb all the time   Past Surgical History  Procedure Laterality Date  . Laparoscopic gastric banding  03/2007    [facility] - [facility]; Dr Jorja Loa Hipp  . Carpal tunnel release Left   . Varicose vein surgery Bilateral 2009  . Cervical cone biopsy    . Laparoscopic revision of gastric band N/A 05/17/2012    Procedure: LAPAROSCOPIC REVISION OF SLIPPED GASTRIC BAND;  Surgeon: Mariella Saa, MD;  Location: [facility];  Service: General;  Laterality: N/A;  . Esophagogastroduodenoscopy N/A 04/27/2014    Procedure: ESOPHAGOGASTRODUODENOSCOPY (EGD);  Surgeon: Ovidio Kin, MD;  Location: [facility];  Service: General;  Laterality: N/A;  . Laparoscopy N/A 04/30/2014   Procedure: DIAGNOSTIC LAPAROSCOPY WITH REMOVAL OF INFECTED LAP BAND PORT WITH DEBRIDEMENT SUBCUTANEOUS ABSCESS;  Surgeon: Gaynelle Adu, MD;  Location: [facility];  Service: General;  Laterality: N/A;  . Gastric banding port revision N/A 10/02/2014    Procedure: LAPROSCOPIC PLACEMENT OF LAP BAND PORT;  Surgeon: Glenna Fellows, MD;  Location: [facility];  Service: General;  Laterality: N/A;   History reviewed. No pertinent family history. Social History  Substance Use Topics  . Smoking status: Current Every Day Smoker -- 0.25 packs/day for 5 years    Types: Cigarettes  . Smokeless tobacco: Never Used  . Alcohol Use: No   OB History    No data available     Review of Systems  Constitutional: Negative for fever, chills and diaphoresis.  All other systems reviewed and are negative.   Allergies  Demerol; Monosodium glutamate; Vancomycin; and Tape  Home Medications   Prior to Admission medications   Medication Sig Start Date End Date Taking? Authorizing Provider  aspirin EC 81 MG tablet Take 81 mg by mouth daily.    Historical Provider, MD  calcium carbonate (OS-CAL) 600 MG TABS tablet Take 600 mg by mouth daily with breakfast.    Historical Provider, MD  cetirizine (ZYRTEC) 10 MG tablet Take 10 mg by mouth daily as needed for allergies (allergies).    Historical Provider, MD  clonazePAM (KLONOPIN) 0.5 MG  tablet Take 0.5 mg by mouth 3 (three) times daily as needed for anxiety (anxiety).     Historical Provider, MD  Cyanocobalamin (VITAMIN B-12 IJ) Inject as directed every 30 (thirty) days. Last dose was 09/14/2014    Historical Provider, MD  diclofenac (VOLTAREN) 75 MG EC tablet Take 75 mg by mouth 2 (two) times daily.    Historical Provider, MD  HYDROcodone-acetaminophen (NORCO) 10-325 MG per tablet Take 1-2 tablets by mouth every 4 (four) hours as needed (pain). 10/02/14   Glenna Fellows, MD  Magnesium 250 MG TABS Take 1 tablet by mouth daily.    Historical Provider, MD  Multiple Vitamin  (MULITIVITAMIN WITH MINERALS) TABS Take 1 tablet by mouth daily.      Historical Provider, MD  pyridOXINE (VITAMIN B-6) 100 MG tablet Take 100 mg by mouth 3 (three) times daily.    Historical Provider, MD  rizatriptan (MAXALT-MLT) 5 MG disintegrating tablet Take 1 tablet (5 mg total) by mouth as needed for migraine. May repeat in 2 hours if needed 10/17/14   Lattie Haw, MD   Meds Ordered and Administered this Visit   Medications  ondansetron (ZOFRAN-ODT) disintegrating tablet 4 mg (4 mg Oral Given 10/17/14 1115)  ondansetron (ZOFRAN) injection 4 mg (4 mg Intramuscular Given 10/17/14 1140)  ketorolac (TORADOL) injection 60 mg (60 mg Intramuscular Given 10/17/14 1247)  dexamethasone (DECADRON) injection 10 mg (10 mg Intramuscular Given 10/17/14 1247)  metoCLOPramide (REGLAN) injection 5 mg (5 mg Intramuscular Given 10/17/14 1247)    BP 157/99 mmHg  Pulse 60  Temp(Src) 98.3 F (36.8 C) (Oral)  Resp 18  Ht  (1.6 m)  Wt 265 lb (120.203 kg)  BMI 46.95 kg/m2  SpO2 96%  LMP 10/09/2014 No data found.   Physical Exam Nursing notes and Vital Signs reviewed. Appearance:  Patient appears stated age, and uncomfortable but in no acute distress.  Patient is obese (BMI 47.0) Eyes:  Pupils are equal, round, and reactive to light and accomodation.  Extraocular movement is intact.  Conjunctivae are not inflamed.  Fundi benign.  Mild photophobia present.  Ears:  Canals normal.  Tympanic membranes normal.  Nose:  Normal turbinates.  No sinus tenderness.   Pharynx:  Normal Neck:  Supple.  No adenopathy  Lungs:  Clear to auscultation.  Breath sounds are equal.  Moving air well. Heart:  Regular rate and rhythm without murmurs, rubs, or gallops.  Abdomen:  Nontender without masses or hepatosplenomegaly.  Bowel sounds are present.  No CVA or flank tenderness.  Extremities:  No edema.  No calf tenderness Skin:  No rash present.  Neurologic:  Cranial nerves 2 through 12 are normal.  Patellar,  achilles, and elbow reflexes are normal.  Cerebellar function is intact (finger-to-nose and rapid alternating hand movement).  Gait and station are normal.     ED Course  Procedures  None    MDM   1. Migraine without status migrainosus, not intractable, unspecified migraine type    Administered Zofran ODT  PO Administered Zofran  IM;Toradol  IM; Decadron  IM; Reglan  IM Rx for Maxalt MLT  for future use. Followup with PCP If symptoms become significantly worse during the night or over the weekend, proceed to the local emergency room.    Lattie Haw, MD 10/22/14 [phone]

## 2014-10-17 NOTE — Discharge Instructions (Signed)
If symptoms become significantly worse during the night or over the weekend, proceed to the local emergency room.    Migraine Headache A migraine headache is an intense, throbbing pain on one or both sides of your head. A migraine can last for 30 minutes to several hours. CAUSES  The exact cause of a migraine headache is not always known. However, a migraine may be caused when nerves in the brain become irritated and release chemicals that cause inflammation. This causes pain. Certain things may also trigger migraines, such as:  Alcohol.  Smoking.  Stress.  Menstruation.  Aged cheeses.  Foods or drinks that contain nitrates, glutamate, aspartame, or tyramine.  Lack of sleep.  Chocolate.  Caffeine.  Hunger.  Physical exertion.  Fatigue.  Medicines used to treat chest pain (nitroglycerine), birth control pills, estrogen, and some blood pressure medicines. SIGNS AND SYMPTOMS  Pain on one or both sides of your head.  Pulsating or throbbing pain.  Severe pain that prevents daily activities.  Pain that is aggravated by any physical activity.  Nausea, vomiting, or both.  Dizziness.  Pain with exposure to bright lights, loud noises, or activity.  General sensitivity to bright lights, loud noises, or smells. Before you get a migraine, you may get warning signs that a migraine is coming (aura). An aura may include:  Seeing flashing lights.  Seeing bright spots, halos, or zigzag lines.  Having tunnel vision or blurred vision.  Having feelings of numbness or tingling.  Having trouble talking.  Having muscle weakness. DIAGNOSIS  A migraine headache is often diagnosed based on:  Symptoms.  Physical exam.  A CT scan or MRI of your head. These imaging tests cannot diagnose migraines, but they can help rule out other causes of headaches. TREATMENT Medicines may be given for pain and nausea. Medicines can also be given to help prevent recurrent migraines.  HOME  CARE INSTRUCTIONS  Only take over-the-counter or prescription medicines for pain or discomfort as directed by your health care provider. The use of long-term narcotics is not recommended.  Lie down in a dark, quiet room when you have a migraine.  Keep a journal to find out what may trigger your migraine headaches. For example, write down:  What you eat and drink.  How much sleep you get.  Any change to your diet or medicines.  Limit alcohol consumption.  Quit smoking if you smoke.  Get 7-9 hours of sleep, or as recommended by your health care provider.  Limit stress.  Keep lights dim if bright lights bother you and make your migraines worse. SEEK IMMEDIATE MEDICAL CARE IF:   Your migraine becomes severe.  You have a fever.  You have a stiff neck.  You have vision loss.  You have muscular weakness or loss of muscle control.  You start losing your balance or have trouble walking.  You feel faint or pass out.  You have severe symptoms that are different from your first symptoms. MAKE SURE YOU:   Understand these instructions.  Will watch your condition.  Will get help right away if you are not doing well or get worse.   This information is not intended to replace advice given to you by your health care provider. Make sure you discuss any questions you have with your health care provider.   Document Released: 12/23/2004 Document Revised: 01/13/2014 Document Reviewed: 08/30/2012 Elsevier Interactive Patient Education [employer]

## 2015-02-16 DIAGNOSIS — K219 Gastro-esophageal reflux disease without esophagitis: Secondary | ICD-10-CM | POA: Insufficient documentation

## 2015-02-16 DIAGNOSIS — F17200 Nicotine dependence, unspecified, uncomplicated: Secondary | ICD-10-CM | POA: Insufficient documentation

## 2015-02-16 DIAGNOSIS — E538 Deficiency of other specified B group vitamins: Secondary | ICD-10-CM | POA: Insufficient documentation

## 2015-02-16 DIAGNOSIS — G43909 Migraine, unspecified, not intractable, without status migrainosus: Secondary | ICD-10-CM | POA: Insufficient documentation

## 2015-02-21 ENCOUNTER — Encounter: Payer: Self-pay | Admitting: *Deleted

## 2015-02-21 ENCOUNTER — Emergency Department (INDEPENDENT_AMBULATORY_CARE_PROVIDER_SITE_OTHER): Payer: BLUE CROSS/BLUE SHIELD

## 2015-02-21 ENCOUNTER — Emergency Department (INDEPENDENT_AMBULATORY_CARE_PROVIDER_SITE_OTHER)
Admission: EM | Admit: 2015-02-21 | Discharge: 2015-02-21 | Disposition: A | Discharge status: Home / Self Care | Payer: BLUE CROSS/BLUE SHIELD | Source: Home / Self Care | Attending: Family Medicine | Admitting: Family Medicine

## 2015-02-21 DIAGNOSIS — S62635A Displaced fracture of distal phalanx of left ring finger, initial encounter for closed fracture: Secondary | ICD-10-CM

## 2015-02-21 DIAGNOSIS — X58XXXA Exposure to other specified factors, initial encounter: Secondary | ICD-10-CM | POA: Diagnosis not present

## 2015-02-21 DIAGNOSIS — S62605A Fracture of unspecified phalanx of left ring finger, initial encounter for closed fracture: Secondary | ICD-10-CM

## 2015-02-21 NOTE — ED Notes (Signed)
Pt c/o LT 4th finger injury x 1330 today after hitting it on her car door. She took Lortab at 1700 today.

## 2015-02-21 NOTE — ED Provider Notes (Signed)
CSN: [id number]     Arrival date & time 02/21/15  2011 History   First MD Initiated Contact with Patient 02/21/15 2028     Chief Complaint  Patient presents with  . Finger Injury   (Consider location/radiation/quality/duration/timing/severity/associated sxs/prior Treatment) HPI The pt is a 40yo female presenting to [facility] with c/o gradually worsening Left ring finger pain that started around 1330 this afternoon when she accidentally jammed her finger on the top of her car door while trying to get in.  She thought she just broke the tip of her nail off so she buddy tapped her fingers together but this evening she removed the tape and noticed her finger looked deformed, swollen and bruised. She notes she cannot move the tip of her finger very much. Pain is aching and throbbing, 4/10 at this time. She has a hx of chronic back pain, which she takes Lortab, so she took that around 1700 which provided moderate relief but swelling and bruising has continued to worsen. She is Right hand dominant.  She goes to Dr. Orie Fisherman and Bertram Gala, Orthopedist Office, for her back.   Past Medical History  Diagnosis Date  . LAP-BAND surgery status   . Arthritis   . Cervical dysplasia   . Depression   . Chronic back pain   . Degenerative disc disease   . Spondylarthrosis   . Migraine   . Hypertension     off all bp meds x 3-4 years  . Chronic bronchitis (HCC)     hx of  . Carpal tunnel syndrome     right wrist, numb all the time   Past Surgical History  Procedure Laterality Date  . Laparoscopic gastric banding  03/2007    [facility] - [facility]; Dr Jorja Loa Hipp  . Carpal tunnel release Left   . Varicose vein surgery Bilateral 2009  . Cervical cone biopsy    . Laparoscopic revision of gastric band N/A 05/17/2012    Procedure: LAPAROSCOPIC REVISION OF SLIPPED GASTRIC BAND;  Surgeon: Mariella Saa, MD;  Location: [facility];  Service: General;  Laterality: N/A;  . Esophagogastroduodenoscopy N/A  04/27/2014    Procedure: ESOPHAGOGASTRODUODENOSCOPY (EGD);  Surgeon: Ovidio Kin, MD;  Location: [facility];  Service: General;  Laterality: N/A;  . Laparoscopy N/A 04/30/2014    Procedure: DIAGNOSTIC LAPAROSCOPY WITH REMOVAL OF INFECTED LAP BAND PORT WITH DEBRIDEMENT SUBCUTANEOUS ABSCESS;  Surgeon: Gaynelle Adu, MD;  Location: [facility];  Service: General;  Laterality: N/A;  . Gastric banding port revision N/A 10/02/2014    Procedure: LAPROSCOPIC PLACEMENT OF LAP BAND PORT;  Surgeon: Glenna Fellows, MD;  Location: [facility];  Service: General;  Laterality: N/A;   History reviewed. No pertinent family history. Social History  Substance Use Topics  . Smoking status: Current Every Day Smoker -- 0.25 packs/day for 5 years    Types: Cigarettes  . Smokeless tobacco: Never Used  . Alcohol Use: No   OB History    No data available     Review of Systems  Musculoskeletal: Positive for myalgias, joint swelling and arthralgias.  Skin: Positive for color change. Negative for wound.  Neurological: Positive for weakness. Negative for numbness.    Allergies  Demerol; Monosodium glutamate; Vancomycin; and Tape  Home Medications   Prior to Admission medications   Medication Sig Start Date End Date Taking? Authorizing Provider  lisinopril (PRINIVIL,ZESTRIL) 10 MG tablet Take 10 mg by mouth daily.   Yes Historical Provider, MD  aspirin EC 81 MG tablet  Take 81 mg by mouth daily.    Historical Provider, MD  calcium carbonate (OS-CAL) 600 MG TABS tablet Take 600 mg by mouth daily with breakfast.    Historical Provider, MD  cetirizine (ZYRTEC) 10 MG tablet Take 10 mg by mouth daily as needed for allergies (allergies).    Historical Provider, MD  clonazePAM (KLONOPIN) 0.5 MG tablet Take 0.5 mg by mouth 3 (three) times daily as needed for anxiety (anxiety).     Historical Provider, MD  Cyanocobalamin (VITAMIN B-12 IJ) Inject as directed every 30 (thirty) days. Last dose was 09/14/2014    Historical Provider,  MD  diclofenac (VOLTAREN) 75 MG EC tablet Take 75 mg by mouth 2 (two) times daily.    Historical Provider, MD  HYDROcodone-acetaminophen (NORCO) 10-325 MG per tablet Take 1-2 tablets by mouth every 4 (four) hours as needed (pain). 10/02/14   Glenna Fellows, MD  Magnesium 250 MG TABS Take 1 tablet by mouth daily.    Historical Provider, MD  Multiple Vitamin (MULITIVITAMIN WITH MINERALS) TABS Take 1 tablet by mouth daily.      Historical Provider, MD  pyridOXINE (VITAMIN B-6) 100 MG tablet Take 100 mg by mouth 3 (three) times daily.    Historical Provider, MD  rizatriptan (MAXALT-MLT) 5 MG disintegrating tablet Take 1 tablet (5 mg total) by mouth as needed for migraine. May repeat in 2 hours if needed 10/17/14   Lattie Haw, MD   Meds Ordered and Administered this Visit  Medications - No data to display  BP 148/85 mmHg  Pulse 61  Temp(Src) 98.1 F (36.7 C) (Oral)  Resp 18  Ht  (1.6 m)  Wt 268 lb (121.564 kg)  BMI 47.49 kg/m2  SpO2 99%  LMP 02/19/2015 No data found.   Physical Exam  Constitutional: She is oriented to person, place, and time. She appears well-developed and well-nourished.  HENT:  Head: Normocephalic and atraumatic.  Eyes: EOM are normal.  Neck: Normal range of motion.  Cardiovascular: Normal rate.   Left 4th finger: cap refill < 3 seconds  Pulmonary/Chest: Effort normal.  Musculoskeletal: She exhibits edema and tenderness.  Left 4th finger, mallet finger deformity with mild edema to distal aspect of finger. Tenderness of DIP. Limited flexion at DIP.   Neurological: She is alert and oriented to person, place, and time.  Left 4th finger: normal sensation  Skin: Skin is warm and dry.  Left 4th finger: skin in tact. Ecchymosis around DIP and nailbed. Nailbed in tact. No subungual hematoma.   Psychiatric: She has a normal mood and affect. Her behavior is normal.  Nursing note and vitals reviewed.   ED Course  Procedures (including critical care  time)  Labs Review Labs Reviewed - No data to display  Imaging Review Dg Hand Complete Left  02/21/2015  CLINICAL DATA:  Left finger injury after slamming in car door. Pain and ring finger. EXAM: LEFT HAND - COMPLETE 3+ VIEW COMPARISON:  None. FINDINGS: Three views study shows avulsion injury involving the posterior base of the ring finger distal phalanx. No other acute fracture is evident. Old ulnar styloid fracture noted. Degenerative changes are seen in the first carpometacarpal joint. IMPRESSION: Acute fracture involving the posterior base of the ring finger distal phalanx. Electronically Signed   By: Kennith Center M.D.   On: 02/21/2015 20:41     MDM   1. Fracture of fourth finger, left, closed, initial encounter     Pt presenting to [facility] with Left 4th finger  deformity, pain, swelling, and bruising.  Plain films: acute fracture involving posterior base of ring finger distal phalanx.    Finger splint applied and fingers tapped with buddy tapping technique.  Pt advised to call Sports Medicine in the morning to schedule a f/u appointment.  Pt does have an orthopedist for her back but states she would be interested in finding one in [place] as it is closer to where she lives.   Junius Finner, PA-C 02/22/15 [phone]

## 2015-02-21 NOTE — Discharge Instructions (Signed)
You may take your pain medications as prescribed as well as ibuprofen.  Please call the Sports Medicine office tomorrow morning to schedule a follow up appointment for further evaluation and continued management of your broken finger.     Finger Fracture Finger fractures are breaks in the bones of the fingers. There are many types of fractures. There are also different ways of treating these fractures. Your doctor will talk with you about the best way to treat your fracture. Injury is the main cause of broken fingers. This includes:  Injuries while playing sports.  Workplace injuries.  Falls. HOME CARE  Follow your doctor's instructions for:  Activities.  Exercises.  Physical therapy.  Take medicines only as told by your doctor for pain, discomfort, or fever. GET HELP IF: You have pain or swelling that limits:  The motion of your fingers.  The use of your fingers. GET HELP RIGHT AWAY IF:  You cannot feel your fingers, or your fingers become numb.   This information is not intended to replace advice given to you by your health care provider. Make sure you discuss any questions you have with your health care provider.   Document Released: 06/11/2007 Document Revised: 01/13/2014 Document Reviewed: 08/04/2012 Elsevier Interactive Patient Education [employer]

## 2015-02-22 ENCOUNTER — Encounter: Payer: Self-pay | Admitting: Family Medicine

## 2015-02-22 ENCOUNTER — Ambulatory Visit (INDEPENDENT_AMBULATORY_CARE_PROVIDER_SITE_OTHER): Payer: BLUE CROSS/BLUE SHIELD | Admitting: Family Medicine

## 2015-02-22 VITALS — BP 134/69 | HR 62 | Wt 267.0 lb

## 2015-02-22 DIAGNOSIS — IMO0001 Reserved for inherently not codable concepts without codable children: Secondary | ICD-10-CM | POA: Insufficient documentation

## 2015-02-22 DIAGNOSIS — M20012 Mallet finger of left finger(s): Secondary | ICD-10-CM

## 2015-02-22 NOTE — Progress Notes (Signed)
   Subjective:    I'm seeing this patient as a consultation for:  Junius Finner PA-C  CC: Left 4th digit mallet finger  HPI: Patient suffered an injury to her left hand yesterday afternoon. She was seen in urgent care where she was diagnosed with a bony mallet finger. She notes minimal pain. She was splinted in extension and presents to clinic today for follow-up and evaluation. She feels well. She works as an Retail banker at [employer]  Past medical history, Surgical history, Family history not pertinant except as noted below, Social history, Allergies, and medications have been entered into the medical record, reviewed, and no changes needed.   Review of Systems: No headache, visual changes, nausea, vomiting, diarrhea, constipation, dizziness, abdominal pain, skin rash, fevers, chills, night sweats, weight loss, swollen lymph nodes, body aches, joint swelling, muscle aches, chest pain, shortness of breath, mood changes, visual or auditory hallucinations.   Objective:    General: Well Developed, well nourished, and in no acute distress.  Neuro/Psych: Alert and oriented x3, extra-ocular muscles intact, able to move all 4 extremities, sensation grossly intact. Skin: Warm and dry, no rashes noted.  Respiratory: Not using accessory muscles, speaking in full sentences, trachea midline.  Cardiovascular: Pulses palpable, no extremity edema. Abdomen: Does not appear distended. MSK: Left 4th digit with ecchymosis and palmar flexion deformity with extension lag. Capillary refill and sensation intact.   STACK splint applied  No results found for this or any previous visit (from the past 24 hour(s)). Dg Hand Complete Left  02/21/2015  CLINICAL DATA:  Left finger injury after slamming in car door. Pain and ring finger. EXAM: LEFT HAND - COMPLETE 3+ VIEW COMPARISON:  None. FINDINGS: Three views study shows avulsion injury involving the posterior base of the ring finger distal phalanx. No other acute  fracture is evident. Old ulnar styloid fracture noted. Degenerative changes are seen in the first carpometacarpal joint. IMPRESSION: Acute fracture involving the posterior base of the ring finger distal phalanx. Electronically Signed   By: Kennith Center M.D.   On: 02/21/2015 20:41    Impression and Recommendations:   This case required medical decision making of moderate complexity.

## 2015-02-22 NOTE — Patient Instructions (Signed)
Thank you for coming in today. Return in 2 weeks. Keep the finger from bending.   Mallet Finger Mallet finger is an injury that occurs from a blow to the tip of your straightened finger or thumb. It is also known as baseball finger. The blow to your fingertip causes it to bend farther than normal, which tears the cord that attaches to the tip of your finger (extensor tendon). Your extensor tendon is what straightens the end of your finger. If this tendon is damaged, you will not be able to straighten your fingertip. Sometimes, a piece of bone may be pulled away with the tendon (avulsion injury), or the tendon may tear completely. In some cases, surgery may be required to repair the damage. CAUSES Mallet finger is caused by a hard, direct hit to the tip of your finger or thumb. This injury often happens from getting hit in the finger with a hard ball, such as a baseball. RISK FACTORS This injury is more likely to happen if you play sports that use a hard ball. SYMPTOMS  The main symptom of this injury is not being able to straighten the tip of your finger. You can manually straighten your fingertip with your other hand, but the finger cannot straighten on its own. Other symptoms may include:  Pain.  Swelling.  Bruising.  Blood under the fingernail. DIAGNOSIS  Your health care provider may suspect mallet finger if you are not able to extend your fingertip, especially if you recently injured your hand. Your health care provider will do a physical exam. This may include X-rays to see if a piece of bone has been pulled away or if the finger joint has separated (dislocated). TREATMENT  Mallet finger may be treated with:  Wearing a splint on your fingertip to keep it straight (extended) while the tendon heals.  Surgery to repair the tendon, in severe cases. This may involve:  The use of a pin or screw to keep your finger extended and your tendon attached.  Taking a piece of tendon from  another part of your body (graft) to replace a torn tendon. HOME CARE INSTRUCTIONS   Take medicines only as directed by your health care provider.  Wear the splint as directed by your health care provider. Remove it only as directed by your health care provider.  If you take your splint off to dry it or change it, gently press your finger on a flat surface to keep it straight.  If directed, apply ice to the injured area:   Put ice in a plastic bag.   Place a towel between your skin and the bag.   Leave the ice on for 20 minutes, 2-3 times a day.  Raise the injured area above the level of your heart while you are sitting or lying down. SEEK MEDICAL CARE IF:   You have pain or swelling that is getting worse.   Your finger feels cold.   You cannot extend your finger after treatment. SEEK IMMEDIATE MEDICAL CARE IF:   Even after loosening your splint, your finger is:  Very red and swollen.  White or blue.  Numb or tingling.   This information is not intended to replace advice given to you by your health care provider. Make sure you discuss any questions you have with your health care provider.   Document Released: 12/21/1999 Document Revised: 05/09/2014 Document Reviewed: 10/26/2013 Elsevier Interactive Patient Education [employer]

## 2015-02-22 NOTE — Assessment & Plan Note (Signed)
Plan for extension splinting. Recheck in 2 weeks.

## 2015-02-28 ENCOUNTER — Ambulatory Visit (INDEPENDENT_AMBULATORY_CARE_PROVIDER_SITE_OTHER): Payer: BLUE CROSS/BLUE SHIELD | Admitting: Osteopathic Medicine

## 2015-02-28 DIAGNOSIS — Z5329 Procedure and treatment not carried out because of patient's decision for other reasons: Secondary | ICD-10-CM

## 2015-02-28 NOTE — Progress Notes (Signed)
NO SHOW

## 2015-03-13 ENCOUNTER — Ambulatory Visit (INDEPENDENT_AMBULATORY_CARE_PROVIDER_SITE_OTHER): Payer: BLUE CROSS/BLUE SHIELD | Admitting: Family Medicine

## 2015-03-13 ENCOUNTER — Ambulatory Visit (INDEPENDENT_AMBULATORY_CARE_PROVIDER_SITE_OTHER): Payer: BLUE CROSS/BLUE SHIELD

## 2015-03-13 ENCOUNTER — Encounter: Payer: Self-pay | Admitting: Family Medicine

## 2015-03-13 VITALS — BP 140/97 | HR 72 | Wt 268.0 lb

## 2015-03-13 DIAGNOSIS — M20012 Mallet finger of left finger(s): Secondary | ICD-10-CM | POA: Diagnosis not present

## 2015-03-13 DIAGNOSIS — X58XXXD Exposure to other specified factors, subsequent encounter: Secondary | ICD-10-CM | POA: Diagnosis not present

## 2015-03-13 DIAGNOSIS — IMO0001 Reserved for inherently not codable concepts without codable children: Secondary | ICD-10-CM

## 2015-03-13 DIAGNOSIS — S62635G Displaced fracture of distal phalanx of left ring finger, subsequent encounter for fracture with delayed healing: Secondary | ICD-10-CM

## 2015-03-13 NOTE — Progress Notes (Signed)
Samantha Doyle is a 40 y.o. female who presents to [facility]: [facility] Care today for follow-up left fourth digit mallet fracture. Patient was originally seen on February 16. She is placed into a stack splint. Since then she has done well with minimal pain. She has been very diligent about careful splinting of her finger when the splint is off.   Past Medical History  Diagnosis Date  . LAP-BAND surgery status   . Arthritis   . Cervical dysplasia   . Depression   . Chronic back pain   . Degenerative disc disease   . Spondylarthrosis   . Migraine   . Hypertension     off all bp meds x 3-4 years  . Chronic bronchitis (HCC)     hx of  . Carpal tunnel syndrome     right wrist, numb all the time   Past Surgical History  Procedure Laterality Date  . Laparoscopic gastric banding  03/2007    [facility] - [facility]; Dr Jorja Loa Hipp  . Carpal tunnel release Left   . Varicose vein surgery Bilateral 2009  . Cervical cone biopsy    . Laparoscopic revision of gastric band N/A 05/17/2012    Procedure: LAPAROSCOPIC REVISION OF SLIPPED GASTRIC BAND;  Surgeon: Mariella Saa, MD;  Location: [facility];  Service: General;  Laterality: N/A;  . Esophagogastroduodenoscopy N/A 04/27/2014    Procedure: ESOPHAGOGASTRODUODENOSCOPY (EGD);  Surgeon: Ovidio Kin, MD;  Location: [facility];  Service: General;  Laterality: N/A;  . Laparoscopy N/A 04/30/2014    Procedure: DIAGNOSTIC LAPAROSCOPY WITH REMOVAL OF INFECTED LAP BAND PORT WITH DEBRIDEMENT SUBCUTANEOUS ABSCESS;  Surgeon: Gaynelle Adu, MD;  Location: [facility];  Service: General;  Laterality: N/A;  . Gastric banding port revision N/A 10/02/2014    Procedure: LAPROSCOPIC PLACEMENT OF LAP BAND PORT;  Surgeon: Glenna Fellows, MD;  Location: [facility];  Service: General;  Laterality: N/A;   Social History  Substance Use Topics  . Smoking status: Current  Every Day Smoker -- 0.25 packs/day for 5 years    Types: Cigarettes  . Smokeless tobacco: Never Used  . Alcohol Use: No   family history is not on file.  ROS as above Medications: Current Outpatient Prescriptions  Medication Sig Dispense Refill  . aspirin EC 81 MG tablet Take 81 mg by mouth daily.    . cetirizine (ZYRTEC) 10 MG tablet Take 10 mg by mouth daily as needed for allergies (allergies).    . diclofenac (VOLTAREN) 75 MG EC tablet Take 75 mg by mouth 2 (two) times daily.    Marland Kitchen HYDROcodone-acetaminophen (NORCO) 10-325 MG per tablet Take 1-2 tablets by mouth every 4 (four) hours as needed (pain). 40 tablet 0  . lisinopril (PRINIVIL,ZESTRIL) 10 MG tablet Take 10 mg by mouth daily.    . Multiple Vitamin (MULITIVITAMIN WITH MINERALS) TABS Take 1 tablet by mouth daily.      . rizatriptan (MAXALT-MLT) 5 MG disintegrating tablet Take 1 tablet (5 mg total) by mouth as needed for migraine. May repeat in 2 hours if needed 9 tablet 1   No current facility-administered medications for this visit.   Allergies  Allergen Reactions  . Demerol Hives and Nausea And Vomiting  . Monosodium Glutamate Nausea And Vomiting and Other (See Comments)    migraines  . Vancomycin Itching    Face/back warm and red.  Pt lips and tongue swelled, hives  . Tape Rash    Plastic  tape     Exam:  BP 140/97 mmHg  Pulse 72  Wt 268 lb (121.564 kg)  LMP 02/19/2015 Gen: Well NAD Left fourth digit well-appearing no skin maceration.  X-ray left fourth digit Improved location of bone fragment without any obvious ossification or bone healing.  No results found for this or any previous visit (from the past 24 hour(s)). No results found.   Please see individual assessment and plan sections.

## 2015-03-13 NOTE — Patient Instructions (Signed)
Thank you for coming in today. Continue splinting.  Return in 3 weeks.

## 2015-03-13 NOTE — Assessment & Plan Note (Signed)
Not yet healed. Recheck in 3 weeks.

## 2015-04-05 ENCOUNTER — Encounter: Payer: Self-pay | Admitting: Family Medicine

## 2015-04-05 ENCOUNTER — Ambulatory Visit (INDEPENDENT_AMBULATORY_CARE_PROVIDER_SITE_OTHER): Payer: BLUE CROSS/BLUE SHIELD | Admitting: Family Medicine

## 2015-04-05 ENCOUNTER — Ambulatory Visit (INDEPENDENT_AMBULATORY_CARE_PROVIDER_SITE_OTHER): Payer: BLUE CROSS/BLUE SHIELD

## 2015-04-05 VITALS — BP 138/80 | HR 67 | Wt 267.0 lb

## 2015-04-05 DIAGNOSIS — IMO0001 Reserved for inherently not codable concepts without codable children: Secondary | ICD-10-CM

## 2015-04-05 DIAGNOSIS — X58XXXD Exposure to other specified factors, subsequent encounter: Secondary | ICD-10-CM

## 2015-04-05 DIAGNOSIS — S62635D Displaced fracture of distal phalanx of left ring finger, subsequent encounter for fracture with routine healing: Secondary | ICD-10-CM | POA: Diagnosis not present

## 2015-04-05 DIAGNOSIS — M20012 Mallet finger of left finger(s): Secondary | ICD-10-CM | POA: Diagnosis not present

## 2015-04-05 NOTE — Progress Notes (Signed)
Samantha Doyle is a 40 y.o. female who presents to [facility]: Primary Care today for follow-up left mallet fracture. Patient was originally seen on December 16 where she was diagnosed with a mallet fracture of her left fourth digit. In the interim she's been splinted in extension with a stack splint. She feels well and is currently pain-free.   Past Medical History  Diagnosis Date  . LAP-BAND surgery status   . Arthritis   . Cervical dysplasia   . Depression   . Chronic back pain   . Degenerative disc disease   . Spondylarthrosis   . Migraine   . Hypertension     off all bp meds x 3-4 years  . Chronic bronchitis (HCC)     hx of  . Carpal tunnel syndrome     right wrist, numb all the time   Past Surgical History  Procedure Laterality Date  . Laparoscopic gastric banding  03/2007    [facility] - [facility]; Dr Jorja Loa Hipp  . Carpal tunnel release Left   . Varicose vein surgery Bilateral 2009  . Cervical cone biopsy    . Laparoscopic revision of gastric band N/A 05/17/2012    Procedure: LAPAROSCOPIC REVISION OF SLIPPED GASTRIC BAND;  Surgeon: Mariella Saa, MD;  Location: [facility];  Service: General;  Laterality: N/A;  . Esophagogastroduodenoscopy N/A 04/27/2014    Procedure: ESOPHAGOGASTRODUODENOSCOPY (EGD);  Surgeon: Ovidio Kin, MD;  Location: [facility];  Service: General;  Laterality: N/A;  . Laparoscopy N/A 04/30/2014    Procedure: DIAGNOSTIC LAPAROSCOPY WITH REMOVAL OF INFECTED LAP BAND PORT WITH DEBRIDEMENT SUBCUTANEOUS ABSCESS;  Surgeon: Gaynelle Adu, MD;  Location: [facility];  Service: General;  Laterality: N/A;  . Gastric banding port revision N/A 10/02/2014    Procedure: LAPROSCOPIC PLACEMENT OF LAP BAND PORT;  Surgeon: Glenna Fellows, MD;  Location: [facility];  Service: General;  Laterality: N/A;   Social History  Substance Use Topics  . Smoking status: Current  Every Day Smoker -- 0.25 packs/day for 5 years    Types: Cigarettes  . Smokeless tobacco: Never Used  . Alcohol Use: No   family history is not on file.  ROS as above Medications: Current Outpatient Prescriptions  Medication Sig Dispense Refill  . aspirin EC 81 MG tablet Take 81 mg by mouth daily.    . cetirizine (ZYRTEC) 10 MG tablet Take 10 mg by mouth daily as needed for allergies (allergies).    . diclofenac (VOLTAREN) 75 MG EC tablet Take 75 mg by mouth 2 (two) times daily.    Marland Kitchen HYDROcodone-acetaminophen (NORCO) 10-325 MG per tablet Take 1-2 tablets by mouth every 4 (four) hours as needed (pain). 40 tablet 0  . lisinopril (PRINIVIL,ZESTRIL) 10 MG tablet Take 10 mg by mouth daily.    . Multiple Vitamin (MULITIVITAMIN WITH MINERALS) TABS Take 1 tablet by mouth daily.      . penicillin v potassium (VEETID) 500 MG tablet   0  . rizatriptan (MAXALT-MLT) 5 MG disintegrating tablet Take 1 tablet (5 mg total) by mouth as needed for migraine. May repeat in 2 hours if needed 9 tablet 1   No current facility-administered medications for this visit.   Allergies  Allergen Reactions  . Demerol Hives and Nausea And Vomiting  . Monosodium Glutamate Nausea And Vomiting and Other (See Comments)    migraines  . Vancomycin Itching    Face/back warm and red.  Pt lips and tongue  swelled, hives  . Tape Rash    Plastic tape     Exam:  Wt 267 lb (121.11 kg)  LMP 03/29/2015 Gen: Well NAD Left fourth digit is normal appearing and nontender. Motion not tested.  X-ray finger shows continued fracture line with narrowed spacing. No significant callus formation. Awaiting radiology review  No results found for this or any previous visit (from the past 24 hour(s)). No results found.  Mallet fracture. Clinically doing well. Continue immobilization for 4 more weeks. Recheck in about a month.

## 2015-04-05 NOTE — Patient Instructions (Signed)
Thank you for coming in today. Return in 1 month.  Continue the brace.  Return sooner if needed

## 2015-04-06 NOTE — Progress Notes (Signed)
Quick Note:  Fracture still there ______

## 2015-05-03 ENCOUNTER — Ambulatory Visit (INDEPENDENT_AMBULATORY_CARE_PROVIDER_SITE_OTHER): Payer: BLUE CROSS/BLUE SHIELD | Admitting: Family Medicine

## 2015-05-03 ENCOUNTER — Encounter: Payer: Self-pay | Admitting: Family Medicine

## 2015-05-03 ENCOUNTER — Ambulatory Visit (INDEPENDENT_AMBULATORY_CARE_PROVIDER_SITE_OTHER): Payer: BLUE CROSS/BLUE SHIELD

## 2015-05-03 VITALS — BP 135/87 | HR 66 | Wt 263.0 lb

## 2015-05-03 DIAGNOSIS — W228XXD Striking against or struck by other objects, subsequent encounter: Secondary | ICD-10-CM

## 2015-05-03 DIAGNOSIS — S62665D Nondisplaced fracture of distal phalanx of left ring finger, subsequent encounter for fracture with routine healing: Secondary | ICD-10-CM

## 2015-05-03 DIAGNOSIS — IMO0001 Reserved for inherently not codable concepts without codable children: Secondary | ICD-10-CM

## 2015-05-03 DIAGNOSIS — M20012 Mallet finger of left finger(s): Secondary | ICD-10-CM | POA: Diagnosis not present

## 2015-05-03 NOTE — Assessment & Plan Note (Signed)
With fibrous union. Clinically healed. Discontinue splinting and resume normal life.

## 2015-05-03 NOTE — Patient Instructions (Signed)
Thank you for coming in today. Resume your life.

## 2015-05-03 NOTE — Progress Notes (Signed)
Samantha MayansHeather A Gabrys is a 40 y.o. female who [facility]: [facility] today for follow-up  mallet fracture left fourth digit. This originally was diagnosed on February 15. She's had immobilization since with Stax splint. She feels fine. She denies any pain. She continues immobilization.    Past Medical History  Diagnosis Date  . LAP-BAND surgery status   . Arthritis   . Cervical dysplasia   . Depression   . Chronic back pain   . Degenerative disc disease   . Spondylarthrosis   . Migraine   . Hypertension     off all bp meds x 3-4 years  . Chronic bronchitis (HCC)     hx of  . Carpal tunnel syndrome     right wrist, numb all the time   Past Surgical History  Procedure Laterality Date  . Laparoscopic gastric banding  03/2007    [facility] - [facility]; Dr Jorja Loaim Hipp  . Carpal tunnel release Left   . Varicose vein surgery Bilateral 2009  . Cervical cone biopsy    . Laparoscopic revision of gastric band N/A 05/17/2012    Procedure: LAPAROSCOPIC REVISION OF SLIPPED GASTRIC BAND;  Surgeon: Mariella SaaBenjamin T Hoxworth, MD;  Location: [facility];  Service: General;  Laterality: N/A;  . Esophagogastroduodenoscopy N/A 04/27/2014    Procedure: ESOPHAGOGASTRODUODENOSCOPY (EGD);  Surgeon: Ovidio Kinavid Newman, MD;  [facility];  Service: General;  Laterality: N/A;  . Laparoscopy N/A 04/30/2014    Procedure: DIAGNOSTIC LAPAROSCOPY WITH REMOVAL OF INFECTED LAP BAND PORT WITH DEBRIDEMENT SUBCUTANEOUS ABSCESS;  Surgeon: Gaynelle AduEric Wilson, MD;  Location: [facility];  Service: General;  Laterality: N/A;  . Gastric banding port revision N/A 10/02/2014    Procedure: LAPROSCOPIC PLACEMENT OF LAP BAND PORT;  Surgeon: Glenna FellowsBenjamin Hoxworth, MD;  Location: [facility];  Service: General;  Laterality: N/A;   Social History  Substance Use Topics  . Smoking status: Current Every Day Smoker -- 0.25 packs/day for 5 years   Types: Cigarettes  . Smokeless tobacco: Never Used  . Alcohol Use: No   family history is not on file.  ROS as above Medications: Current Outpatient Prescriptions  Medication Sig Dispense Refill  . aspirin EC 81 MG tablet Take 81 mg by mouth daily.    . cetirizine (ZYRTEC) 10 MG tablet Take 10 mg by mouth daily as needed for allergies (allergies).    . diclofenac (VOLTAREN) 75 MG EC tablet Take 75 mg by mouth 2 (two) times daily.    Marland Kitchen. HYDROcodone-acetaminophen (NORCO) 10-325 MG per tablet Take 1-2 tablets by mouth every 4 (four) hours as needed (pain). 40 tablet 0  . lisinopril (PRINIVIL,ZESTRIL) 10 MG tablet Take 10 mg by mouth daily.    . Multiple Vitamin (MULITIVITAMIN WITH MINERALS) TABS Take 1 tablet by mouth daily.      . penicillin v potassium (VEETID) 500 MG tablet   0  . rizatriptan (MAXALT-MLT) 5 MG disintegrating tablet Take 1 tablet (5 mg total) by mouth as needed for migraine. May repeat in 2 hours if needed 9 tablet 1   No current facility-administered medications for this visit.   Allergies  Allergen Reactions  . Demerol Hives and Nausea And Vomiting  . Monosodium Glutamate Nausea And Vomiting and Other (See Comments)    migraines  . Vancomycin Itching    Face/back warm and red.  Pt lips and tongue swelled, hives  . Tape Rash    Plastic tape  Exam:  LMP 03/29/2015 Gen: Well NAD Left 4th digit. Well-appearing nontender normal extension strength. Impaired flexion motion. Patient notes stiffness    Xray finger: Nonunion but corticated fracture fragment appears well. No further displacement.  No results found for this or any previous visit (from the past 24 hour(s)). No results found.   Please see individual assessment and plan sections.

## 2015-05-04 NOTE — Progress Notes (Signed)
Quick Note:  Normal, no changes. ______ 

## 2015-05-10 ENCOUNTER — Ambulatory Visit (INDEPENDENT_AMBULATORY_CARE_PROVIDER_SITE_OTHER): Payer: BLUE CROSS/BLUE SHIELD | Admitting: Family Medicine

## 2015-05-10 ENCOUNTER — Encounter: Payer: Self-pay | Admitting: Family Medicine

## 2015-05-10 VITALS — BP 145/88 | HR 68 | Wt 261.0 lb

## 2015-05-10 DIAGNOSIS — M25642 Stiffness of left hand, not elsewhere classified: Secondary | ICD-10-CM | POA: Diagnosis not present

## 2015-05-10 DIAGNOSIS — M20012 Mallet finger of left finger(s): Secondary | ICD-10-CM | POA: Diagnosis not present

## 2015-05-10 DIAGNOSIS — M25649 Stiffness of unspecified hand, not elsewhere classified: Secondary | ICD-10-CM | POA: Insufficient documentation

## 2015-05-10 DIAGNOSIS — IMO0001 Reserved for inherently not codable concepts without codable children: Secondary | ICD-10-CM

## 2015-05-10 NOTE — Patient Instructions (Signed)
Thank you for coming in today. Attend hand therapy in [place].  Call [phone] and ask to speak with the physical therapy/occupational therapy department to schedule if you don't hear from them.. Return in 4 weeks

## 2015-05-10 NOTE — Assessment & Plan Note (Signed)
Due to immobilization. Refer to hand therapy.

## 2015-05-10 NOTE — Assessment & Plan Note (Signed)
The extensor tendon is still intact which is great news. I don't think mallet fracture is the cause of her pain. I believe this is due to stiffness due to extensive immobilization. Plan for referral to occupational therapy for hand rehabilitation. Check in a month.

## 2015-05-10 NOTE — Progress Notes (Signed)
Samantha MayansHeather A Doyle is a 40 y.o. female who [facility] today for follow-up left fourth digit pain. Patient has been seen extensively for a mallet fracture of her left fourth digit. She was released to resume normal activities about a week ago. Since then she notes pain and soreness on the palmar aspect of the hand and stiffness in the DIP. She's resumed use of the stack splint especially at work which seems to help.   Past Medical History  Diagnosis Date  . LAP-BAND surgery status   . Arthritis   . Cervical dysplasia   . Depression   . Chronic back pain   . Degenerative disc disease   . Spondylarthrosis   . Migraine   . Hypertension     off all bp meds x 3-4 years  . Chronic bronchitis (HCC)     hx of  . Carpal tunnel syndrome     right wrist, numb all the time   Past Surgical History  Procedure Laterality Date  . Laparoscopic gastric banding  03/2007    [facility]; Dr Jorja Loaim Hipp  . Carpal tunnel release Left   . Varicose vein surgery Bilateral 2009  . Cervical cone biopsy    . Laparoscopic revision of gastric band N/A 05/17/2012    Procedure: LAPAROSCOPIC REVISION OF SLIPPED GASTRIC BAND;  Surgeon: Mariella SaaBenjamin T Hoxworth, MD;  Location: [facility];  Service: General;  Laterality: N/A;  . Esophagogastroduodenoscopy N/A 04/27/2014    Procedure: ESOPHAGOGASTRODUODENOSCOPY (EGD);  Surgeon: Ovidio Kinavid Newman, MD;  [facility];  Service: General;  Laterality: N/A;  . Laparoscopy N/A 04/30/2014    Procedure: DIAGNOSTIC LAPAROSCOPY WITH REMOVAL OF INFECTED LAP BAND PORT WITH DEBRIDEMENT SUBCUTANEOUS ABSCESS;  Surgeon: Gaynelle AduEric Wilson, MD;  Location: [facility];  Service: General;  Laterality: N/A;  . Gastric banding port revision N/A 10/02/2014    Procedure: LAPROSCOPIC PLACEMENT OF LAP BAND PORT;  Surgeon: Glenna FellowsBenjamin Hoxworth, MD;  Location: [facility];  Service:  General;  Laterality: N/A;   Social History  Substance Use Topics  . Smoking status: Current Every Day Smoker -- 0.25 packs/day for 5 years    Types: Cigarettes  . Smokeless tobacco: Never Used  . Alcohol Use: No   family history is not on file.  ROS as above Medications: Current Outpatient Prescriptions  Medication Sig Dispense Refill  . aspirin EC 81 MG tablet Take 81 mg by mouth daily.    . cetirizine (ZYRTEC) 10 MG tablet Take 10 mg by mouth daily as needed for allergies (allergies).    . diclofenac (VOLTAREN) 75 MG EC tablet Take 75 mg by mouth 2 (two) times daily.    Marland Kitchen. HYDROcodone-acetaminophen (NORCO) 10-325 MG per tablet Take 1-2 tablets by mouth every 4 (four) hours as needed (pain). 40 tablet 0  . lisinopril (PRINIVIL,ZESTRIL) 10 MG tablet Take 10 mg by mouth daily.    . Multiple Vitamin (MULITIVITAMIN WITH MINERALS) TABS Take 1 tablet by mouth daily.      . penicillin v potassium (VEETID) 500 MG tablet   0  . rizatriptan (MAXALT-MLT) 5 MG disintegrating tablet Take 1 tablet (5 mg total) by mouth as needed for migraine. May repeat in 2 hours if needed 9 tablet 1   No current facility-administered medications for this visit.   Allergies  Allergen Reactions  . Demerol Hives and Nausea And Vomiting  . Monosodium Glutamate Nausea And Vomiting and Other (See Comments)  migraines  . Vancomycin Itching    Face/back warm and red.  Pt lips and tongue swelled, hives  . Tape Rash    Plastic tape     Exam:  BP 145/88 mmHg  Pulse 68  Wt 261 lb (118.389 kg) Gen: Well NAD Left fourth digit normal-appearing. Mildly tender at the DIP. Normally and intact extension strength with no significant extensor lag. Flexion range of motion is limited  No results found for this or any previous visit (from the past 24 hour(s)). No results found.   Please see individual assessment and plan sections.

## 2015-05-14 DIAGNOSIS — M4726 Other spondylosis with radiculopathy, lumbar region: Secondary | ICD-10-CM | POA: Diagnosis not present

## 2015-05-14 DIAGNOSIS — G894 Chronic pain syndrome: Secondary | ICD-10-CM | POA: Diagnosis not present

## 2015-05-14 DIAGNOSIS — Z79891 Long term (current) use of opiate analgesic: Secondary | ICD-10-CM | POA: Diagnosis not present

## 2015-05-14 DIAGNOSIS — M5136 Other intervertebral disc degeneration, lumbar region: Secondary | ICD-10-CM | POA: Diagnosis not present

## 2015-06-12 DIAGNOSIS — M5136 Other intervertebral disc degeneration, lumbar region: Secondary | ICD-10-CM | POA: Diagnosis not present

## 2015-07-16 DIAGNOSIS — M791 Myalgia: Secondary | ICD-10-CM | POA: Diagnosis not present

## 2015-07-16 DIAGNOSIS — M4726 Other spondylosis with radiculopathy, lumbar region: Secondary | ICD-10-CM | POA: Diagnosis not present

## 2015-07-16 DIAGNOSIS — Z79891 Long term (current) use of opiate analgesic: Secondary | ICD-10-CM | POA: Diagnosis not present

## 2015-07-16 DIAGNOSIS — G894 Chronic pain syndrome: Secondary | ICD-10-CM | POA: Diagnosis not present

## 2015-07-16 DIAGNOSIS — M5136 Other intervertebral disc degeneration, lumbar region: Secondary | ICD-10-CM | POA: Diagnosis not present

## 2015-08-23 DIAGNOSIS — G5603 Carpal tunnel syndrome, bilateral upper limbs: Secondary | ICD-10-CM | POA: Diagnosis not present

## 2015-08-23 DIAGNOSIS — M5412 Radiculopathy, cervical region: Secondary | ICD-10-CM | POA: Diagnosis not present

## 2015-08-23 DIAGNOSIS — M545 Low back pain: Secondary | ICD-10-CM | POA: Diagnosis not present

## 2015-08-23 DIAGNOSIS — M5417 Radiculopathy, lumbosacral region: Secondary | ICD-10-CM | POA: Diagnosis not present

## 2015-08-23 DIAGNOSIS — M542 Cervicalgia: Secondary | ICD-10-CM | POA: Diagnosis not present

## 2015-08-29 DIAGNOSIS — I10 Essential (primary) hypertension: Secondary | ICD-10-CM | POA: Diagnosis not present

## 2015-09-06 DIAGNOSIS — M47816 Spondylosis without myelopathy or radiculopathy, lumbar region: Secondary | ICD-10-CM | POA: Diagnosis not present

## 2015-09-06 DIAGNOSIS — M4316 Spondylolisthesis, lumbar region: Secondary | ICD-10-CM | POA: Diagnosis not present

## 2015-09-06 DIAGNOSIS — M4726 Other spondylosis with radiculopathy, lumbar region: Secondary | ICD-10-CM | POA: Diagnosis not present

## 2015-09-06 DIAGNOSIS — M791 Myalgia: Secondary | ICD-10-CM | POA: Diagnosis not present

## 2015-09-06 DIAGNOSIS — G894 Chronic pain syndrome: Secondary | ICD-10-CM | POA: Diagnosis not present

## 2015-09-06 DIAGNOSIS — M5136 Other intervertebral disc degeneration, lumbar region: Secondary | ICD-10-CM | POA: Diagnosis not present

## 2015-10-07 DIAGNOSIS — N83209 Unspecified ovarian cyst, unspecified side: Secondary | ICD-10-CM

## 2015-10-07 HISTORY — DX: Unspecified ovarian cyst, unspecified side: N83.209

## 2015-10-22 DIAGNOSIS — M5416 Radiculopathy, lumbar region: Secondary | ICD-10-CM | POA: Diagnosis not present

## 2015-10-22 DIAGNOSIS — M4316 Spondylolisthesis, lumbar region: Secondary | ICD-10-CM | POA: Diagnosis not present

## 2015-10-22 DIAGNOSIS — M9973 Connective tissue and disc stenosis of intervertebral foramina of lumbar region: Secondary | ICD-10-CM | POA: Diagnosis not present

## 2015-11-01 DIAGNOSIS — M5136 Other intervertebral disc degeneration, lumbar region: Secondary | ICD-10-CM | POA: Diagnosis not present

## 2015-11-01 DIAGNOSIS — G894 Chronic pain syndrome: Secondary | ICD-10-CM | POA: Diagnosis not present

## 2015-11-01 DIAGNOSIS — Z79891 Long term (current) use of opiate analgesic: Secondary | ICD-10-CM | POA: Diagnosis not present

## 2015-11-01 DIAGNOSIS — M4726 Other spondylosis with radiculopathy, lumbar region: Secondary | ICD-10-CM | POA: Diagnosis not present

## 2015-11-13 DIAGNOSIS — G894 Chronic pain syndrome: Secondary | ICD-10-CM | POA: Diagnosis not present

## 2015-11-13 DIAGNOSIS — M791 Myalgia: Secondary | ICD-10-CM | POA: Diagnosis not present

## 2015-11-23 DIAGNOSIS — D259 Leiomyoma of uterus, unspecified: Secondary | ICD-10-CM | POA: Diagnosis not present

## 2015-11-23 DIAGNOSIS — N83209 Unspecified ovarian cyst, unspecified side: Secondary | ICD-10-CM | POA: Diagnosis not present

## 2015-11-23 DIAGNOSIS — N83202 Unspecified ovarian cyst, left side: Secondary | ICD-10-CM | POA: Diagnosis not present

## 2015-12-17 DIAGNOSIS — G894 Chronic pain syndrome: Secondary | ICD-10-CM | POA: Diagnosis not present

## 2015-12-17 DIAGNOSIS — M791 Myalgia: Secondary | ICD-10-CM | POA: Diagnosis not present

## 2015-12-27 DIAGNOSIS — M5136 Other intervertebral disc degeneration, lumbar region: Secondary | ICD-10-CM | POA: Diagnosis not present

## 2015-12-27 DIAGNOSIS — G894 Chronic pain syndrome: Secondary | ICD-10-CM | POA: Diagnosis not present

## 2015-12-27 DIAGNOSIS — M4726 Other spondylosis with radiculopathy, lumbar region: Secondary | ICD-10-CM | POA: Diagnosis not present

## 2015-12-27 DIAGNOSIS — Z79891 Long term (current) use of opiate analgesic: Secondary | ICD-10-CM | POA: Diagnosis not present

## 2016-01-12 ENCOUNTER — Encounter: Payer: Self-pay | Admitting: *Deleted

## 2016-01-12 ENCOUNTER — Emergency Department (INDEPENDENT_AMBULATORY_CARE_PROVIDER_SITE_OTHER)
Admission: EM | Admit: 2016-01-12 | Discharge: 2016-01-12 | Disposition: A | Discharge status: Home / Self Care | Payer: BLUE CROSS/BLUE SHIELD | Source: Home / Self Care | Attending: Family Medicine | Admitting: Family Medicine

## 2016-01-12 DIAGNOSIS — R112 Nausea with vomiting, unspecified: Secondary | ICD-10-CM | POA: Diagnosis not present

## 2016-01-12 MED ORDER — ONDANSETRON 4 MG PO TBDP: ORAL_TABLET | ORAL | 0 refills | Status: DC

## 2016-01-12 MED ORDER — ONDANSETRON 4 MG PO TBDP
4.0000 mg | ORAL_TABLET | Freq: Once | ORAL | Status: AC
  Administered 2016-01-12: 4 mg via ORAL

## 2016-01-12 NOTE — Discharge Instructions (Signed)
Begin clear liquids for about 12 to 18 hours, then may begin a BRAT diet (Bananas, Rice, Applesauce, Toast) when nausea and dizziness resolved.  Then gradually advance to a regular diet as tolerated.  Avoid milk products until well. °  °If symptoms become significantly worse during the night or over the weekend, proceed to the local emergency room.  °

## 2016-01-12 NOTE — ED Triage Notes (Signed)
Patient c/o vomiting without diarrhea x yesterday.

## 2016-01-12 NOTE — ED Provider Notes (Signed)
[facility]    CSN: [id number] Arrival date & time: 01/12/16  1719     History   Chief Complaint Chief Complaint  Patient presents with  . Emesis    HPI Samantha Doyle is a 41 y.o. female.   Patient reports that she developed sweating and nausea/vomiting yesterday at 9pm.  She had eaten a meal 1.5 hours prior that had contained MSG which she does not tolerate.  She had nausea again this morning, and had an episode of vomiting at 3:30pm today.  No abdominal pain or diarrhea.  No fever.  She is able to take fluids.   The history is provided by the patient.  Emesis  Severity:  Mild Duration:  1 day Timing:  Sporadic Quality:  Stomach contents Able to tolerate:  Liquids Progression:  Improving Chronicity:  New Recent urination:  Normal Relieved by:  None tried Worsened by:  Nothing Ineffective treatments:  None tried Associated symptoms: no abdominal pain, no arthralgias, no chills, no cough, no diarrhea, no fever, no myalgias, no sore throat and no URI   Risk factors: no sick contacts, no suspect food intake and no travel to endemic areas     Past Medical History:  Diagnosis Date  . Arthritis   . Carpal tunnel syndrome    right wrist, numb all the time  . Cervical dysplasia   . Chronic back pain   . Chronic bronchitis (HCC)    hx of  . Degenerative disc disease   . Depression   . Hypertension    off all bp meds x 3-4 years  . LAP-BAND surgery status   . Migraine   . Spondylarthrosis     Patient Active Problem List   Diagnosis Date Noted  . Stiffness of finger joint 05/10/2015  . Mallet deformity of fourth finger, left 02/22/2015  . Lapband APS 2009 [facility] 05/16/2012  . Encounter for fitting or adjustment of gastric lap band 05/13/2012    Past Surgical History:  Procedure Laterality Date  . CARPAL TUNNEL RELEASE Left   . CERVICAL CONE BIOPSY    . ESOPHAGOGASTRODUODENOSCOPY N/A 04/27/2014   Procedure: ESOPHAGOGASTRODUODENOSCOPY (EGD);   Surgeon: Ovidio Kin, MD;  Location: [facility];  Service: General;  Laterality: N/A;  . GASTRIC BANDING PORT REVISION N/A 10/02/2014   Procedure: LAPROSCOPIC PLACEMENT OF LAP BAND PORT;  Surgeon: Glenna Fellows, MD;  Location: [facility];  Service: General;  Laterality: N/A;  . LAPAROSCOPIC GASTRIC BANDING  03/2007   [facility] - [facility]; Dr Jorja Loa Hipp  . LAPAROSCOPIC REVISION OF GASTRIC BAND N/A 05/17/2012   Procedure: LAPAROSCOPIC REVISION OF SLIPPED GASTRIC BAND;  Surgeon: Mariella Saa, MD;  [facility]: [facility];  Service: General;  Laterality: N/A;  . LAPAROSCOPY N/A 04/30/2014   Procedure: DIAGNOSTIC LAPAROSCOPY WITH REMOVAL OF INFECTED LAP BAND PORT WITH DEBRIDEMENT SUBCUTANEOUS ABSCESS;  Surgeon: Gaynelle Adu, MD;  Location: [facility];  Service: General;  Laterality: N/A;  . VARICOSE VEIN SURGERY Bilateral 2009    OB History    No data available       Home Medications    Prior to Admission medications   Medication Sig Start Date End Date Taking? Authorizing Provider  aspirin EC 81 MG tablet Take 81 mg by mouth daily.    Historical Provider, MD  cetirizine (ZYRTEC) 10 MG tablet Take 10 mg by mouth daily as needed for allergies (allergies).    Historical Provider, MD  diclofenac (VOLTAREN) 75 MG EC tablet Take 75  mg by mouth 2 (two) times daily.    Historical Provider, MD  lisinopril (PRINIVIL,ZESTRIL) 10 MG tablet Take 10 mg by mouth daily.    Historical Provider, MD  Multiple Vitamin (MULITIVITAMIN WITH MINERALS) TABS Take 1 tablet by mouth daily.      Historical Provider, MD  ondansetron (ZOFRAN ODT) 4 MG disintegrating tablet Take one tab by mouth Q6hr prn nausea.  Dissolve under tongue. 01/12/16   Lattie Haw, MD  rizatriptan (MAXALT-MLT) 5 MG disintegrating tablet Take 1 tablet (5 mg total) by mouth as needed for migraine. May repeat in 2 hours if needed 10/17/14   Lattie Haw, MD    Family History History reviewed. No pertinent family history.  Social  History Social History  Substance Use Topics  . Smoking status: Current Every Day Smoker    Packs/day: 0.25    Years: 5.00    Types: Cigarettes  . Smokeless tobacco: Never Used  . Alcohol use No     Allergies   Demerol; Monosodium glutamate; Vancomycin; and Tape   Review of Systems Review of Systems  Constitutional: Negative for chills and fever.  HENT: Negative for sore throat.   Respiratory: Negative for cough.   Gastrointestinal: Positive for vomiting. Negative for abdominal pain and diarrhea.  Musculoskeletal: Negative for arthralgias and myalgias.  All other systems reviewed and are negative.    Physical Exam Triage Vital Signs ED Triage Vitals  Enc Vitals Group     BP 01/12/16 1741 109/73     Pulse Rate 01/12/16 1741 84     Resp 01/12/16 1741 16     Temp 01/12/16 1741 98.1 F (36.7 C)     Temp Source 01/12/16 1741 Oral     SpO2 01/12/16 1741 100 %     Weight 01/12/16 1742 241 lb (109.3 kg)     Height --      Head Circumference --      Peak Flow --      Pain Score 01/12/16 1743 0     Pain Loc --      Pain Edu? --      Excl. in GC? --    No data found.   Updated Vital Signs BP 109/73 (BP Location: Right Arm)   Pulse 84   Temp 98.1 F (36.7 C) (Oral)   Resp 16   Wt 241 lb (109.3 kg)   LMP 12/23/2015   SpO2 100%   BMI 42.69 kg/m   Visual Acuity Right Eye Distance:   Left Eye Distance:   Bilateral Distance:    Right Eye Near:   Left Eye Near:    Bilateral Near:     Physical Exam Nursing notes and Vital Signs reviewed. Appearance:  Patient appears stated age, and in no acute distress Eyes:  Pupils are equal, round, and reactive to light and accomodation.  Extraocular movement is intact.  Conjunctivae are not inflamed  Ears:  Canals normal.  Tympanic membranes normal.  Nose:   Normal turbinates.  No sinus tenderness.    Pharynx:  Normal; moist mucous membranes  Neck:  Supple.  No adenopathy. Lungs:  Clear to auscultation.  Breath sounds  are equal.  Moving air well. Heart:  Regular rate and rhythm without murmurs, rubs, or gallops.  Abdomen:  Nontender without masses or hepatosplenomegaly.  Bowel sounds are present.  No CVA or flank tenderness.  Extremities:  No edema.  Skin:  No rash present.    UC Treatments / Results  Labs (  all labs ordered are listed, but only abnormal results are displayed) Labs Reviewed - No data to display  EKG  EKG Interpretation None       Radiology No results found.  Procedures Procedures (including critical care time)  Medications Ordered in UC Medications  ondansetron (ZOFRAN-ODT) disintegrating tablet 4 mg (4 mg Oral Given 01/12/16 1744)     Initial Impression / Assessment and Plan / UC Course  I have reviewed the triage vital signs and the nursing notes.  Pertinent labs & imaging results that were available during my care of the patient were reviewed by me and considered in my medical decision making (see chart for details).  Clinical Course   Begin clear liquids for about 12 to 18 hours, then may begin a BRAT diet (Bananas, Rice, Applesauce, Toast) when nausea and dizziness resolved.  Then gradually advance to a regular diet as tolerated.  Avoid milk products until well.   If symptoms become significantly worse during the night or over the weekend, proceed to the local emergency room.  Followup with Family Doctor if not improved in 3 days.     Final Clinical Impressions(s) / UC Diagnoses   Final diagnoses:  Non-intractable vomiting with nausea, unspecified vomiting type    New Prescriptions New Prescriptions   ONDANSETRON (ZOFRAN ODT) 4 MG DISINTEGRATING TABLET    Take one tab by mouth Q6hr prn nausea.  Dissolve under tongue.     Lattie HawStephen A Kaliyah Gladman, MD 01/29/16 1351

## 2016-01-17 ENCOUNTER — Ambulatory Visit: Payer: Self-pay | Admitting: Family Medicine

## 2016-01-28 ENCOUNTER — Encounter: Payer: Self-pay | Admitting: Family Medicine

## 2016-01-28 ENCOUNTER — Ambulatory Visit: Payer: Self-pay | Admitting: Family Medicine

## 2016-01-28 NOTE — Progress Notes (Deleted)
Office Note 01/28/2016  CC: No chief complaint on file.   HPI:  Samantha Doyle is a 41 y.o. {desc; ethnicity:[id number]} female who is here to establish care. Patient's most recent primary MD: *** Old records in EPIC/[facility] EMR were reviewed prior to or during today's visit.  Past Medical History:  Diagnosis Date  . Arthritis   . Carpal tunnel syndrome    right wrist, numb all the time  . Cervical dysplasia   . Chronic back pain   . Chronic bronchitis (HCC)    hx of  . Degenerative disc disease   . Depression   . Hypertension    off all bp meds x 3-4 years  . LAP-BAND surgery status   . Migraine   . Spondylarthrosis     Past Surgical History:  Procedure Laterality Date  . CARPAL TUNNEL RELEASE Left   . CERVICAL CONE BIOPSY    . ESOPHAGOGASTRODUODENOSCOPY N/A 04/27/2014   Procedure: ESOPHAGOGASTRODUODENOSCOPY (EGD);  Surgeon: Ovidio Kin, MD;  Location: [facility];  Service: General;  Laterality: N/A;  . GASTRIC BANDING PORT REVISION N/A 10/02/2014   Procedure: LAPROSCOPIC PLACEMENT OF LAP BAND PORT;  Surgeon: Glenna Fellows, MD;  Location: [facility];  Service: General;  Laterality: N/A;  . LAPAROSCOPIC GASTRIC BANDING  03/2007   [facility]; Dr Jorja Loa Hipp  . LAPAROSCOPIC REVISION OF GASTRIC BAND N/A 05/17/2012   Procedure: LAPAROSCOPIC REVISION OF SLIPPED GASTRIC BAND;  Surgeon: Mariella Saa, MD;  Location: [facility];  Service: General;  Laterality: N/A;  . LAPAROSCOPY N/A 04/30/2014   Procedure: DIAGNOSTIC LAPAROSCOPY WITH REMOVAL OF INFECTED LAP BAND PORT WITH DEBRIDEMENT SUBCUTANEOUS ABSCESS;  Surgeon: Gaynelle Adu, MD;  Location: [facility];  Service: General;  Laterality: N/A;  . VARICOSE VEIN SURGERY Bilateral 2009    No family history on file.  Social History   Social History  . Marital status: Single    Spouse name: N/A  . Number of children: N/A  . Years of education: N/A   Occupational History  . Not on file.   Social History  Main Topics  . Smoking status: Current Every Day Smoker    Packs/day: 0.25    Years: 5.00    Types: Cigarettes  . Smokeless tobacco: Never Used  . Alcohol use No  . Drug use: No  . Sexual activity: Yes    Birth control/ protection: None   Other Topics Concern  . Not on file   Social History Narrative  . No narrative on file    Outpatient Encounter Prescriptions as of 01/28/2016  Medication Sig  . aspirin EC 81 MG tablet Take 81 mg by mouth daily.  . cetirizine (ZYRTEC) 10 MG tablet Take 10 mg by mouth daily as needed for allergies (allergies).  . diclofenac (VOLTAREN) 75 MG EC tablet Take 75 mg by mouth 2 (two) times daily.  Marland Kitchen lisinopril (PRINIVIL,ZESTRIL) 10 MG tablet Take 10 mg by mouth daily.  . Multiple Vitamin (MULITIVITAMIN WITH MINERALS) TABS Take 1 tablet by mouth daily.    . ondansetron (ZOFRAN ODT) 4 MG disintegrating tablet Take one tab by mouth Q6hr prn nausea.  Dissolve under tongue.  . rizatriptan (MAXALT-MLT) 5 MG disintegrating tablet Take 1 tablet (5 mg total) by mouth as needed for migraine. May repeat in 2 hours if needed   No facility-administered encounter medications on file as of 01/28/2016.     Allergies  Allergen Reactions  . Demerol Hives and Nausea And Vomiting  .  Monosodium Glutamate Nausea And Vomiting and Other (See Comments)    migraines  . Vancomycin Itching    Face/back warm and red.  Pt lips and tongue swelled, hives  . Tape Rash    Plastic tape    ROS *** PE; Last menstrual period 12/23/2015. *** Pertinent labs:  No results found for: TSH Lab Results  Component Value Date   WBC 9.2 09/28/2014   HGB 12.2 09/28/2014   HCT 39.0 09/28/2014   MCV 85.5 09/28/2014   PLT 226 09/28/2014   Lab Results  Component Value Date   CREATININE 0.47 09/28/2014   BUN 12 09/28/2014   NA 138 09/28/2014   K 4.2 09/28/2014   CL 102 09/28/2014   CO2 29 09/28/2014   Lab Results  Component Value Date   ALT 18 04/19/2014   AST 18 04/19/2014    ALKPHOS 54 04/19/2014   BILITOT 0.5 04/19/2014   No results found for: CHOL No results found for: HDL No results found for: LDLCALC No results found for: TRIG No results found for: CHOLHDL  ASSESSMENT AND PLAN:   No problem-specific Assessment & Plan notes found for this encounter.   No Follow-up on file.

## 2016-02-06 ENCOUNTER — Encounter: Payer: Self-pay | Admitting: Family Medicine

## 2016-02-06 ENCOUNTER — Ambulatory Visit (INDEPENDENT_AMBULATORY_CARE_PROVIDER_SITE_OTHER): Payer: BLUE CROSS/BLUE SHIELD | Admitting: Family Medicine

## 2016-02-06 VITALS — BP 140/84 | HR 69 | Temp 98.0°F | Resp 16 | Ht 63.0 in | Wt 245.0 lb

## 2016-02-06 DIAGNOSIS — F320 Major depressive disorder, single episode, mild: Secondary | ICD-10-CM

## 2016-02-06 DIAGNOSIS — I10 Essential (primary) hypertension: Secondary | ICD-10-CM

## 2016-02-06 DIAGNOSIS — Z87410 Personal history of cervical dysplasia: Secondary | ICD-10-CM | POA: Diagnosis not present

## 2016-02-06 DIAGNOSIS — Z9884 Bariatric surgery status: Secondary | ICD-10-CM

## 2016-02-06 DIAGNOSIS — F17200 Nicotine dependence, unspecified, uncomplicated: Secondary | ICD-10-CM

## 2016-02-06 MED ORDER — BUPROPION HCL ER (XL) 300 MG PO TB24: 300.0000 mg | ORAL_TABLET | Freq: Every day | ORAL | 11 refills | Status: DC

## 2016-02-06 MED ORDER — LISINOPRIL 30 MG PO TABS: 30.0000 mg | ORAL_TABLET | Freq: Every day | ORAL | 6 refills | Status: DC

## 2016-02-06 NOTE — Progress Notes (Signed)
Office Note 02/06/2016  CC:  Chief Complaint  Patient presents with  . Establish Care    Requesting refills   HPI:  Samantha Doyle is a 41 y.o. female who is here to establish care. Patient's most recent primary MD: [facility] in [place]. Old records in EPIC/[facility] EMR were reviewed prior to or during today's visit.  Hx of successful smoking cessation with wellbutrin xl 300 mg and has been off it 2 weeks and just started smoking again.  It also helped her depression go into remission.  She really wants to get back on this med!  Home bp monitoring: 140s/80s when taking med daily.  160s systolic when misses meds. Her lisinopril was increased to 20mg  qd  By Dr. Oneal Grout several months ago (October? Per pt)  And pt says labs were done at that time.  Most recent pap was 2016, will be due again this summer.  She does not have a GYN MD.  Hx of lap band surgery and some revisions.  She reports having vit B12 deficiency since that time but has only had to have intermittent vit B12 injections.  Currently takes daily subligual dose of vit B12.   Past Medical History:  Diagnosis Date  . Arthritis   . Carpal tunnel syndrome    right wrist, numb all the time  . Cervical dysplasia   . Chronic back pain   . Chronic bronchitis (HCC)    hx of  . DDD (degenerative disc disease), lumbar    MRI 10/2015: grade I (7mm) anterolisthesis L4 on L5, w/ disc herniation with encroachment on spinal nerves at L4-S1 levels.  . Depression   . History of fracture of phalanx of finger    left, 4th finger distal phalanx  . History of non anemic vitamin B12 deficiency    s/p lap band surgery per pt report.  . Hyperlipidemia   . Hypertension    off all bp meds x 3-4 years  . LAP-BAND surgery status   . Migraine   . Ovarian cyst 10/2015   LEFT, 4 cm--noted on L spine MRI w/out contrast.  F/u u/s imaging showed simple cyst.  . Spondylarthrosis     Past Surgical History:  Procedure Laterality Date  . CARPAL  TUNNEL RELEASE Left   . CERVICAL CONE BIOPSY    . COLONOSCOPY     2015  . ESOPHAGOGASTRODUODENOSCOPY N/A 04/27/2014   Procedure: ESOPHAGOGASTRODUODENOSCOPY (EGD);  Surgeon: Ovidio Kin, MD;  Location: [facility];  Service: General;  Laterality: N/A;  . GASTRIC BANDING PORT REVISION N/A 10/02/2014   Procedure: LAPROSCOPIC PLACEMENT OF LAP BAND PORT;  Surgeon: Glenna Fellows, MD;  Location: [facility];  Service: General;  Laterality: N/A;  . LAPAROSCOPIC GASTRIC BANDING  03/2007   [facility] - [facility]; Dr Jorja Loa Hipp  . LAPAROSCOPIC REVISION OF GASTRIC BAND N/A 05/17/2012   Procedure: LAPAROSCOPIC REVISION OF SLIPPED GASTRIC BAND;  Surgeon: Mariella Saa, MD;  Location: [facility];  Service: General;  Laterality: N/A;  . LAPAROSCOPY N/A 04/30/2014   Procedure: DIAGNOSTIC LAPAROSCOPY WITH REMOVAL OF INFECTED LAP BAND PORT WITH DEBRIDEMENT SUBCUTANEOUS ABSCESS;  Surgeon: Gaynelle Adu, MD;  Location: [facility];  Service: General;  Laterality: N/A;  . VARICOSE VEIN SURGERY Bilateral 2009    Family History  Problem Relation Age of Onset  . Alcohol abuse Mother   . Drug abuse Mother   . Arthritis Mother   . Cancer Mother   . Hyperlipidemia Mother   . Heart disease  Mother   . Hypertension Mother   . Non-Hodgkin's lymphoma Mother   . Alcohol abuse Father   . Arthritis Father   . Hyperlipidemia Father   . Heart disease Father   . Stroke Father   . Hypertension Father   . Mental illness Father   . Diabetes Father   . Hyperlipidemia Sister   . Hypertension Sister   . Arthritis Maternal Grandmother   . Cancer Maternal Grandmother   . Hypertension Maternal Grandmother   . Arthritis Maternal Grandfather   . Arthritis Paternal Grandmother   . Cancer Paternal Grandmother   . Hyperlipidemia Paternal Grandmother   . Heart disease Paternal Grandmother   . Hypertension Paternal Grandmother   . Diabetes Paternal Grandmother   . Arthritis Paternal Grandfather   . Hyperlipidemia  Paternal Grandfather   . Heart disease Paternal Grandfather   . Stroke Paternal Grandfather   . Hypertension Paternal Grandfather     Social History   Social History  . Marital status: Single    Spouse name: N/A  . Number of children: N/A  . Years of education: N/A   Occupational History  . Not on file.   Social History Main Topics  . Smoking status: Former Smoker    Packs/day: 0.25    Years: 5.00    Types: Cigarettes  . Smokeless tobacco: Never Used  . Alcohol use No  . Drug use: No  . Sexual activity: Yes    Birth control/ protection: None   Other Topics Concern  . Not on file   Social History Narrative   Single, no children.   Occup: Retail banker   Tobacco: 6 pack-yr hx--quit with wellbutrin.   No alcohol or drugs.    Outpatient Encounter Prescriptions as of 02/06/2016  Medication Sig  . HYDROcodone-acetaminophen (NORCO) 10-325 MG tablet   . meloxicam (MOBIC) 15 MG tablet Take by mouth.  . Multiple Vitamin (MULITIVITAMIN WITH MINERALS) TABS Take 1 tablet by mouth daily.    . rizatriptan (MAXALT-MLT) 5 MG disintegrating tablet Take 1 tablet (5 mg total) by mouth as needed for migraine. May repeat in 2 hours if needed  . vitamin C (ASCORBIC ACID) 500 MG tablet Take 500 mg by mouth.  . [DISCONTINUED] lisinopril (PRINIVIL,ZESTRIL) 10 MG tablet Take 20 mg by mouth daily.   Marland Kitchen aspirin EC 81 MG tablet Take 81 mg by mouth daily.  Marland Kitchen buPROPion (WELLBUTRIN XL) 300 MG 24 hr tablet Take 1 tablet (300 mg total) by mouth daily.  . cetirizine (ZYRTEC) 10 MG tablet Take 10 mg by mouth daily as needed for allergies (allergies).  . Cyanocobalamin (VITAMIN B-12) 1000 MCG SUBL Place under the tongue.  . diclofenac (VOLTAREN) 75 MG EC tablet Take 75 mg by mouth 2 (two) times daily.  Marland Kitchen lisinopril (PRINIVIL,ZESTRIL) 30 MG tablet Take 1 tablet (30 mg total) by mouth daily.  . ondansetron (ZOFRAN ODT) 4 MG disintegrating tablet Take one tab by mouth Q6hr prn nausea.  Dissolve under  tongue. (Patient not taking: Reported on 02/06/2016)   No facility-administered encounter medications on file as of 02/06/2016.     Allergies  Allergen Reactions  . Vancomycin Itching and Anaphylaxis    Face/back warm and red.  Pt lips and tongue swelled, hives  . Demerol Hives and Nausea And Vomiting  . Meperidine Nausea And Vomiting and Hives  . Monosodium Glutamate Nausea And Vomiting and Other (See Comments)    migraines  . Tape Rash    Plastic tape  ROS Review of Systems  Constitutional: Negative for fatigue and fever.  HENT: Negative for congestion and sore throat.   Eyes: Negative for visual disturbance.  Respiratory: Negative for cough.   Cardiovascular: Negative for chest pain.  Gastrointestinal: Negative for abdominal pain and nausea.  Genitourinary: Negative for dysuria.  Musculoskeletal: Positive for back pain (chronic). Negative for joint swelling.  Skin: Negative for rash.  Neurological: Negative for weakness and headaches.  Hematological: Negative for adenopathy.    PE; Blood pressure 140/84, pulse 69, temperature 98 F (36.7 C), temperature source Temporal, resp. rate 16, height 5\' 3"  (1.6 m), weight 245 lb (111.1 kg), last menstrual period 01/17/2016, SpO2 97 %. Body mass index is 43.4 kg/m.  Gen: Alert, well appearing.  Patient is oriented to person, place, time, and situation. AFFECT: pleasant, lucid thought and speech. WGN:FAOZENT:Eyes: no injection, icteris, swelling, or exudate.  EOMI, PERRLA. Mouth: lips without lesion/swelling.  Oral mucosa pink and moist. Oropharynx without erythema, exudate, or swelling.  CV: RRR, no m/r/g.   LUNGS: CTA bilat, nonlabored resps, good aeration in all lung fields. ABD: soft, NT/ND EXT: no clubbing, cyanosis, or edema.   Pertinent labs:  None today  ASSESSMENT AND PLAN:   New pt; pt to try to bring her most recent labs from Dr. Migdalia DkGarvin's office to us.  1) Depression; pt states that wellbutrin xl 300mg  very much helped  this. Will get her back on this med at 300mg  dosing since she was tolerating this well and has only been off it a couple of weeks.  2) Tobacco dependence: wellbutrin xl helped her quit, but since going off this med 2 wks ago--she has felt strong urge to smoke, even smoke a cigarette yesterday.  3) HTN, not ideal control.  Increase lisinopril to 30mg  qd.  Continue home bp monitoring. An After Visit Summary was printed and given to the patient.  4) Hx of lap band bariatric surgery: hx of vit B12 def.  Consider recheck vit B12 level at f/u in 4 wks.  5) Hx of cervical dysplasia: approx 20 yrs ago per pt report.  Repeat paps have been wnl per her report. Will plan on repeat Pap/pelvic around June 2018.  An After Visit Summary was printed and given to the patient.  Return in about 4 weeks (around 03/05/2016) for f/u HTN/get BMET.  Signed:  Santiago BumpersPhil McGowen, MD           02/06/2016

## 2016-02-20 DIAGNOSIS — Z79891 Long term (current) use of opiate analgesic: Secondary | ICD-10-CM | POA: Diagnosis not present

## 2016-02-20 DIAGNOSIS — M5136 Other intervertebral disc degeneration, lumbar region: Secondary | ICD-10-CM | POA: Diagnosis not present

## 2016-02-20 DIAGNOSIS — G894 Chronic pain syndrome: Secondary | ICD-10-CM | POA: Diagnosis not present

## 2016-02-20 DIAGNOSIS — M791 Myalgia: Secondary | ICD-10-CM | POA: Diagnosis not present

## 2016-02-20 DIAGNOSIS — M4726 Other spondylosis with radiculopathy, lumbar region: Secondary | ICD-10-CM | POA: Diagnosis not present

## 2016-03-04 ENCOUNTER — Other Ambulatory Visit: Payer: Self-pay | Admitting: *Deleted

## 2016-03-04 MED ORDER — BUPROPION HCL ER (XL) 300 MG PO TB24: 300.0000 mg | ORAL_TABLET | Freq: Every day | ORAL | 3 refills | Status: DC

## 2016-03-04 NOTE — Telephone Encounter (Signed)
Fax from [facility] [place], [address] requesting 90 day supply.  RF request for bupropion LOV: 02/06/16  Next ov: 03/05/16 Last written: 02/06/16 #30 w/ 11RF

## 2016-03-05 ENCOUNTER — Ambulatory Visit: Payer: BLUE CROSS/BLUE SHIELD | Admitting: Family Medicine

## 2016-03-05 NOTE — Progress Notes (Deleted)
OFFICE VISIT  03/05/2016   CC: No chief complaint on file.    HPI:    Patient is a 41 y.o.  female who presents for 4 week f/u uncontrolled HTN.  Past Medical History:  Diagnosis Date  . Arthritis   . Carpal tunnel syndrome    right wrist, numb all the time  . Cervical dysplasia   . Chronic back pain    Degenerative lumbar spondylosis with grade 2 spondylolisthesis L4 on L5 with bilater pars defects L4.   Marland Kitchen Chronic bronchitis (HCC)    hx of  . DDD (degenerative disc disease), lumbar    MRI 10/2015: grade I (7mm) anterolisthesis L4 on L5, w/ disc herniation with encroachment on spinal nerves at L4-S1 levels.  . Depression   . History of fracture of phalanx of finger    left, 4th finger distal phalanx  . History of non anemic vitamin B12 deficiency    s/p lap band surgery per pt report.  . Hyperlipidemia   . Hypertension    off all bp meds x 3-4 years  . LAP-BAND surgery status   . Migraine   . Ovarian cyst 10/2015   LEFT, 4 cm--noted on L spine MRI w/out contrast.  F/u u/s imaging showed simple cyst.  . Spondylarthrosis     Past Surgical History:  Procedure Laterality Date  . CARPAL TUNNEL RELEASE Left   . CERVICAL CONE BIOPSY    . COLONOSCOPY     2015  . ESOPHAGOGASTRODUODENOSCOPY N/A 04/27/2014   Procedure: ESOPHAGOGASTRODUODENOSCOPY (EGD);  Surgeon: Ovidio Kin, MD;  Location: [facility];  Service: General;  Laterality: N/A;  . GASTRIC BANDING PORT REVISION N/A 10/02/2014   Procedure: LAPROSCOPIC PLACEMENT OF LAP BAND PORT;  Surgeon: Glenna Fellows, MD;  Location: [facility];  Service: General;  Laterality: N/A;  . LAPAROSCOPIC GASTRIC BANDING  03/2007   [facility] - [facility]; Dr Jorja Loa Hipp  . LAPAROSCOPIC REVISION OF GASTRIC BAND N/A 05/17/2012   Procedure: LAPAROSCOPIC REVISION OF SLIPPED GASTRIC BAND;  Surgeon: Mariella Saa, MD;  Location: [facility];  Service: General;  Laterality: N/A;  . LAPAROSCOPY N/A 04/30/2014   Procedure: DIAGNOSTIC  LAPAROSCOPY WITH REMOVAL OF INFECTED LAP BAND PORT WITH DEBRIDEMENT SUBCUTANEOUS ABSCESS;  Surgeon: Gaynelle Adu, MD;  Location: [facility];  Service: General;  Laterality: N/A;  . VARICOSE VEIN SURGERY Bilateral 2009    Outpatient Medications Prior to Visit  Medication Sig Dispense Refill  . aspirin EC 81 MG tablet Take 81 mg by mouth daily.    Marland Kitchen buPROPion (WELLBUTRIN XL) 300 MG 24 hr tablet Take 1 tablet (300 mg total) by mouth daily. 90 tablet 3  . cetirizine (ZYRTEC) 10 MG tablet Take 10 mg by mouth daily as needed for allergies (allergies).    . Cyanocobalamin (VITAMIN B-12) 1000 MCG SUBL Place under the tongue.    . diclofenac (VOLTAREN) 75 MG EC tablet Take 75 mg by mouth 2 (two) times daily.    Marland Kitchen HYDROcodone-acetaminophen (NORCO) 10-325 MG tablet   0  . lisinopril (PRINIVIL,ZESTRIL) 30 MG tablet Take 1 tablet (30 mg total) by mouth daily. 30 tablet 6  . meloxicam (MOBIC) 15 MG tablet Take by mouth.    . Multiple Vitamin (MULITIVITAMIN WITH MINERALS) TABS Take 1 tablet by mouth daily.      . ondansetron (ZOFRAN ODT) 4 MG disintegrating tablet Take one tab by mouth Q6hr prn nausea.  Dissolve under tongue. (Patient not taking: Reported on 02/06/2016) 12 tablet 0  .  rizatriptan (MAXALT-MLT) 5 MG disintegrating tablet Take 1 tablet (5 mg total) by mouth as needed for migraine. May repeat in 2 hours if needed 9 tablet 1  . vitamin C (ASCORBIC ACID) 500 MG tablet Take 500 mg by mouth.     No facility-administered medications prior to visit.     Allergies  Allergen Reactions  . Vancomycin Itching and Anaphylaxis    Face/back warm and red.  Pt lips and tongue swelled, hives  . Demerol Hives and Nausea And Vomiting  . Meperidine Nausea And Vomiting and Hives  . Monosodium Glutamate Nausea And Vomiting and Other (See Comments)    migraines  . Tape Rash    Plastic tape    ROS As per HPI  PE: There were no vitals taken for this visit. ***  LABS:    Chemistry      Component Value  Date/Time   NA 138 09/28/2014 1345   K 4.2 09/28/2014 1345   CL 102 09/28/2014 1345   CO2 29 09/28/2014 1345   BUN 12 09/28/2014 1345   CREATININE 0.47 09/28/2014 1345      Component Value Date/Time   CALCIUM 9.1 09/28/2014 1345   ALKPHOS 54 04/19/2014 1837   AST 18 04/19/2014 1837   ALT 18 04/19/2014 1837   BILITOT 0.5 04/19/2014 1837       IMPRESSION AND PLAN:  No problem-specific Assessment & Plan notes found for this encounter.   FOLLOW UP: No Follow-up on file.

## 2016-03-21 ENCOUNTER — Encounter: Payer: Self-pay | Admitting: Family Medicine

## 2016-03-21 ENCOUNTER — Encounter: Payer: Self-pay | Admitting: *Deleted

## 2016-03-21 ENCOUNTER — Ambulatory Visit (INDEPENDENT_AMBULATORY_CARE_PROVIDER_SITE_OTHER): Payer: BLUE CROSS/BLUE SHIELD | Admitting: Family Medicine

## 2016-03-21 VITALS — BP 136/108 | HR 68 | Temp 98.3°F | Resp 16 | Ht 63.0 in | Wt 241.5 lb

## 2016-03-21 DIAGNOSIS — F32A Depression, unspecified: Secondary | ICD-10-CM

## 2016-03-21 DIAGNOSIS — F418 Other specified anxiety disorders: Secondary | ICD-10-CM

## 2016-03-21 DIAGNOSIS — F17200 Nicotine dependence, unspecified, uncomplicated: Secondary | ICD-10-CM | POA: Diagnosis not present

## 2016-03-21 DIAGNOSIS — I10 Essential (primary) hypertension: Secondary | ICD-10-CM | POA: Diagnosis not present

## 2016-03-21 DIAGNOSIS — F19982 Other psychoactive substance use, unspecified with psychoactive substance-induced sleep disorder: Secondary | ICD-10-CM

## 2016-03-21 DIAGNOSIS — F419 Anxiety disorder, unspecified: Secondary | ICD-10-CM

## 2016-03-21 DIAGNOSIS — Z9884 Bariatric surgery status: Secondary | ICD-10-CM

## 2016-03-21 DIAGNOSIS — F329 Major depressive disorder, single episode, unspecified: Secondary | ICD-10-CM

## 2016-03-21 LAB — VITAMIN B12: Vitamin B-12: 828 pg/mL (ref 211–911)

## 2016-03-21 LAB — BASIC METABOLIC PANEL
BUN: 12 mg/dL (ref 6–23)
CHLORIDE: 101 meq/L (ref 96–112)
CO2: 28 meq/L (ref 19–32)
Calcium: 9.1 mg/dL (ref 8.4–10.5)
Creatinine, Ser: 0.61 mg/dL (ref 0.40–1.20)
GFR: 114.26 mL/min (ref >60.00)
GLUCOSE: 76 mg/dL (ref 70–99)
POTASSIUM: 4.3 meq/L (ref 3.5–5.1)
SODIUM: 136 meq/L (ref 135–145)

## 2016-03-21 MED ORDER — CLONAZEPAM 0.5 MG PO TABS: ORAL_TABLET | ORAL | 5 refills | Status: DC

## 2016-03-21 MED ORDER — RIZATRIPTAN BENZOATE 5 MG PO TBDP: 5.0000 mg | ORAL_TABLET | ORAL | 3 refills | Status: DC | PRN

## 2016-03-21 NOTE — Progress Notes (Signed)
Pre visit review using our clinic review tool, if applicable. No additional management support is needed unless otherwise documented below in the visit note. 

## 2016-03-21 NOTE — Progress Notes (Signed)
OFFICE VISIT  03/21/2016   CC:  Chief Complaint  Patient presents with  . Follow-up    HTN, pt is not fasting.   . Insomnia    thinks it may be from the bupropion   HPI:    Patient is a 41 y.o.  female who presents for 6 week f/u HTN, depression, smoking cessation.  Wants to discuss anxiety/insomnia problems as well.  Last visit we got her back on wellbutrin xl 300mg .  This has helped well with anx/dep. Also, lisinopril was increased to 30mg  to help better control her bp.  HTN: home monitoring 130s over 70s.  Works on getting a lot of steps each day.  Anxiety: has been promoted at work, more anxiety lately, one panic attack at work..  Was on klonopin at high/frequent doses at one time in remote past, but she does not want to get back on this as anxiety treatment med b/c was taking too much.  Says the wellbutrin seems to be causing her insomnia but b/c it helps so much with depression she does NOT want to come off of it..  Takes a benadryl most nights to go to sleep. She does want to try getting back on 0.5mg  clonazepam qhs to help with sleep.  She has successfully quit smoking!  Past Medical History:  Diagnosis Date  . Arthritis   . Carpal tunnel syndrome    right wrist, numb all the time  . Cervical dysplasia   . Chronic back pain    Degenerative lumbar spondylosis with grade 2 spondylolisthesis L4 on L5 with bilater pars defects L4.   Marland Kitchen. Chronic bronchitis (HCC)    hx of  . DDD (degenerative disc disease), lumbar    MRI 10/2015: grade I (7mm) anterolisthesis L4 on L5, w/ disc herniation with encroachment on spinal nerves at L4-S1 levels.  . Depression   . History of fracture of phalanx of finger    left, 4th finger distal phalanx  . History of non anemic vitamin B12 deficiency    s/p lap band surgery per pt report.  . Hyperlipidemia   . Hypertension    off all bp meds x 3-4 years  . LAP-BAND surgery status   . Migraine   . Ovarian cyst 10/2015   LEFT, 4 cm--noted  on L spine MRI w/out contrast.  F/u u/s imaging showed simple cyst.  . Spondylarthrosis     Past Surgical History:  Procedure Laterality Date  . CARPAL TUNNEL RELEASE Left   . CERVICAL CONE BIOPSY    . COLONOSCOPY     2015  . ESOPHAGOGASTRODUODENOSCOPY N/A 04/27/2014   Procedure: ESOPHAGOGASTRODUODENOSCOPY (EGD);  Surgeon: Ovidio Kinavid Newman, MD;  [facility];  Service: General;  Laterality: N/A;  . GASTRIC BANDING PORT REVISION N/A 10/02/2014   Procedure: LAPROSCOPIC PLACEMENT OF LAP BAND PORT;  Surgeon: Glenna FellowsBenjamin Hoxworth, MD;  Location: [facility];  Service: General;  Laterality: N/A;  . LAPAROSCOPIC GASTRIC BANDING  03/2007   [facility]; Dr Jorja Loaim Hipp  . LAPAROSCOPIC REVISION OF GASTRIC BAND N/A 05/17/2012   Procedure: LAPAROSCOPIC REVISION OF SLIPPED GASTRIC BAND;  Surgeon: Mariella SaaBenjamin T Hoxworth, MD;  [facility]: [facility];  Service: General;  Laterality: N/A;  . LAPAROSCOPY N/A 04/30/2014   Procedure: DIAGNOSTIC LAPAROSCOPY WITH REMOVAL OF INFECTED LAP BAND PORT WITH DEBRIDEMENT SUBCUTANEOUS ABSCESS;  Surgeon: Gaynelle AduEric Wilson, MD;  Location: [facility];  Service: General;  Laterality: N/A;  . VARICOSE VEIN SURGERY Bilateral 2009    Outpatient Medications  Prior to Visit  Medication Sig Dispense Refill  . buPROPion (WELLBUTRIN XL) 300 MG 24 hr tablet Take 1 tablet (300 mg total) by mouth daily. 90 tablet 3  . cetirizine (ZYRTEC) 10 MG tablet Take 10 mg by mouth daily as needed for allergies (allergies).    Marland Kitchen HYDROcodone-acetaminophen (NORCO) 10-325 MG tablet   0  . lisinopril (PRINIVIL,ZESTRIL) 30 MG tablet Take 1 tablet (30 mg total) by mouth daily. 30 tablet 6  . meloxicam (MOBIC) 15 MG tablet Take by mouth.    . Multiple Vitamin (MULITIVITAMIN WITH MINERALS) TABS Take 1 tablet by mouth daily.      . vitamin C (ASCORBIC ACID) 500 MG tablet Take 500 mg by mouth.    . rizatriptan (MAXALT-MLT) 5 MG disintegrating tablet Take 1 tablet (5 mg total) by mouth as needed for  migraine. May repeat in 2 hours if needed 9 tablet 1  . aspirin EC 81 MG tablet Take 81 mg by mouth daily.    . Cyanocobalamin (VITAMIN B-12) 1000 MCG SUBL Place under the tongue.    . diclofenac (VOLTAREN) 75 MG EC tablet Take 75 mg by mouth 2 (two) times daily.    . ondansetron (ZOFRAN ODT) 4 MG disintegrating tablet Take one tab by mouth Q6hr prn nausea.  Dissolve under tongue. (Patient not taking: Reported on 02/06/2016) 12 tablet 0   No facility-administered medications prior to visit.     Allergies  Allergen Reactions  . Vancomycin Itching and Anaphylaxis    Face/back warm and red.  Pt lips and tongue swelled, hives  . Demerol Hives and Nausea And Vomiting  . Meperidine Nausea And Vomiting and Hives  . Monosodium Glutamate Nausea And Vomiting and Other (See Comments)    migraines  . Tape Rash    Plastic tape    ROS As per HPI  PE: Blood pressure (!) 136/108, pulse 68, temperature 98.3 F (36.8 C), temperature source Oral, resp. rate 16, height 5\' 3"  (1.6 m), weight 241 lb 8 oz (109.5 kg), last menstrual period 03/10/2016, SpO2 97 %. Gen: Alert, well appearing.  Patient is oriented to person, place, time, and situation. AFFECT: pleasant, lucid thought and speech.  Nervous/anxious. CV: RRR, no m/r/g.   LUNGS: CTA bilat, nonlabored resps, good aeration in all lung fields. EXT: no clubbing, cyanosis, or edema.   LABS:    Chemistry      Component Value Date/Time   NA 138 09/28/2014 1345   K 4.2 09/28/2014 1345   CL 102 09/28/2014 1345   CO2 29 09/28/2014 1345   BUN 12 09/28/2014 1345   CREATININE 0.47 09/28/2014 1345      Component Value Date/Time   CALCIUM 9.1 09/28/2014 1345   ALKPHOS 54 04/19/2014 1837   AST 18 04/19/2014 1837   ALT 18 04/19/2014 1837   BILITOT 0.5 04/19/2014 1837      IMPRESSION AND PLAN:  1) HTN; The current medical regimen is effective;  continue present plan and medications. Lytes/cr today.  2) Depression: well controlled on wellbutrin  xL 300mg  qd.  3) Anxiety: she doesn't want any additional med for treatment of this.  4) Insomnia, secondary to wellbutrin.  Pt does NOT want to come off wellbutrin, but asks for 0.5mg  clonazepam to take at bedtime to help her get to sleep.  I am ok with this as long as she shows no sign of escalating dose or using it for anxiety during daytime.  Clonazepam 0.5mg  1 tab qhs prn, #30, RF  x 5.  5) Tob cessation: successful on wellbutrin.  Congratulated pt today.  6) Hx of bariatric surgery (lap band) and was on vit B12 injections at one time.  Now on oral vit B12. Recheck vit b12 level today.  An After Visit Summary was printed and given to the patient.  FOLLOW UP: Return in about 3 months (around 06/21/2016) for GYN/PAP.  Signed:  Santiago Bumpers, MD           03/21/2016

## 2016-04-02 ENCOUNTER — Ambulatory Visit: Payer: BLUE CROSS/BLUE SHIELD | Admitting: Family Medicine

## 2016-04-17 DIAGNOSIS — G894 Chronic pain syndrome: Secondary | ICD-10-CM | POA: Diagnosis not present

## 2016-04-17 DIAGNOSIS — M791 Myalgia: Secondary | ICD-10-CM | POA: Diagnosis not present

## 2016-04-24 DIAGNOSIS — Z79891 Long term (current) use of opiate analgesic: Secondary | ICD-10-CM | POA: Diagnosis not present

## 2016-04-24 DIAGNOSIS — G894 Chronic pain syndrome: Secondary | ICD-10-CM | POA: Diagnosis not present

## 2016-04-24 DIAGNOSIS — M4726 Other spondylosis with radiculopathy, lumbar region: Secondary | ICD-10-CM | POA: Diagnosis not present

## 2016-04-24 DIAGNOSIS — M5136 Other intervertebral disc degeneration, lumbar region: Secondary | ICD-10-CM | POA: Diagnosis not present

## 2016-05-12 DIAGNOSIS — M5136 Other intervertebral disc degeneration, lumbar region: Secondary | ICD-10-CM | POA: Diagnosis not present

## 2016-05-12 DIAGNOSIS — M4726 Other spondylosis with radiculopathy, lumbar region: Secondary | ICD-10-CM | POA: Diagnosis not present

## 2016-05-12 DIAGNOSIS — M5106 Intervertebral disc disorders with myelopathy, lumbar region: Secondary | ICD-10-CM | POA: Diagnosis not present

## 2016-05-12 DIAGNOSIS — Z79891 Long term (current) use of opiate analgesic: Secondary | ICD-10-CM | POA: Diagnosis not present

## 2016-05-12 DIAGNOSIS — G894 Chronic pain syndrome: Secondary | ICD-10-CM | POA: Diagnosis not present

## 2016-05-28 DIAGNOSIS — M4306 Spondylolysis, lumbar region: Secondary | ICD-10-CM | POA: Diagnosis not present

## 2016-05-28 DIAGNOSIS — M47896 Other spondylosis, lumbar region: Secondary | ICD-10-CM | POA: Diagnosis not present

## 2016-05-28 DIAGNOSIS — M47816 Spondylosis without myelopathy or radiculopathy, lumbar region: Secondary | ICD-10-CM | POA: Diagnosis not present

## 2016-05-28 DIAGNOSIS — M4316 Spondylolisthesis, lumbar region: Secondary | ICD-10-CM | POA: Diagnosis not present

## 2016-05-28 DIAGNOSIS — M5136 Other intervertebral disc degeneration, lumbar region: Secondary | ICD-10-CM | POA: Diagnosis not present

## 2016-06-03 DIAGNOSIS — M791 Myalgia: Secondary | ICD-10-CM | POA: Diagnosis not present

## 2016-06-12 DIAGNOSIS — M4306 Spondylolysis, lumbar region: Secondary | ICD-10-CM | POA: Diagnosis not present

## 2016-06-12 DIAGNOSIS — M4316 Spondylolisthesis, lumbar region: Secondary | ICD-10-CM | POA: Diagnosis not present

## 2016-06-19 ENCOUNTER — Ambulatory Visit (INDEPENDENT_AMBULATORY_CARE_PROVIDER_SITE_OTHER): Payer: BLUE CROSS/BLUE SHIELD | Admitting: Family Medicine

## 2016-06-19 ENCOUNTER — Encounter: Payer: Self-pay | Admitting: Family Medicine

## 2016-06-19 VITALS — BP 166/94 | HR 78 | Temp 98.5°F | Resp 16 | Ht 63.0 in | Wt 248.0 lb

## 2016-06-19 DIAGNOSIS — F410 Panic disorder [episodic paroxysmal anxiety] without agoraphobia: Secondary | ICD-10-CM | POA: Diagnosis not present

## 2016-06-19 DIAGNOSIS — F331 Major depressive disorder, recurrent, moderate: Secondary | ICD-10-CM | POA: Diagnosis not present

## 2016-06-19 DIAGNOSIS — F411 Generalized anxiety disorder: Secondary | ICD-10-CM

## 2016-06-19 DIAGNOSIS — I10 Essential (primary) hypertension: Secondary | ICD-10-CM | POA: Diagnosis not present

## 2016-06-19 MED ORDER — LISINOPRIL 40 MG PO TABS: 40.0000 mg | ORAL_TABLET | Freq: Every day | ORAL | 1 refills | Status: DC

## 2016-06-19 MED ORDER — PAROXETINE HCL 20 MG PO TABS: 20.0000 mg | ORAL_TABLET | Freq: Every day | ORAL | 1 refills | Status: DC

## 2016-06-19 NOTE — Progress Notes (Signed)
OFFICE VISIT  06/19/2016   CC:  Chief Complaint  Patient presents with  . Follow-up    RCI,    HPI:    Patient is a 41 y.o. Caucasian female who presents for 3 mo f/u anx/dep and HTN.  Pt starts crying right away and states she is sad all the time--gradually worsening for months.  Feels excessive pressure/overwhelmed at work.  She is a Dietitian.  Had panic attack at work recently. She saw neurosurgeon and she needs back surgery: she is significantly sad/distressed about this---this seemed to be the trigger for her getting acutely worse from a mood standpoint.  She isolates, has anhedonia.  No self medicating with alcohol or drugs. Appetite is down.  Sleep: poor secondary to mind racing with worry. She tries to journal to get things out of her head.   Other contributors: her father lives here now and she feels pressure to take care of him--he had a TIA recently. Her boyfriend is supportive. Has been on paxil in the past and it helped and she was able to d/c this after 3-6 months.  HTN: compliant with lisinopril daily.  Home bp monitoring typically is 130s/80s. Some HAs last couple days-not migrainous per pt.  No vision c/o.  No dizziness.  Past Medical History:  Diagnosis Date  . Arthritis   . Carpal tunnel syndrome    right wrist, numb all the time  . Cervical dysplasia   . Chronic back pain    Degenerative lumbar spondylosis with grade 2 spondylolisthesis L4 on L5 with bilater pars defects L4.   Marland Kitchen Chronic bronchitis (HCC)    hx of  . DDD (degenerative disc disease), lumbar    MRI 10/2015: grade I (7mm) anterolisthesis L4 on L5, w/ disc herniation with encroachment on spinal nerves at L4-S1 levels.  . Depression   . History of fracture of phalanx of finger    left, 4th finger distal phalanx  . History of non anemic vitamin B12 deficiency    s/p lap band surgery per pt report.  . Hyperlipidemia   . Hypertension    off all bp meds x 3-4 years  . LAP-BAND  surgery status   . Migraine   . Ovarian cyst 10/2015   LEFT, 4 cm--noted on L spine MRI w/out contrast.  F/u u/s imaging showed simple cyst.  . Spondylarthrosis     Past Surgical History:  Procedure Laterality Date  . CARPAL TUNNEL RELEASE Left   . CERVICAL CONE BIOPSY    . COLONOSCOPY     2015  . ESOPHAGOGASTRODUODENOSCOPY N/A 04/27/2014   Procedure: ESOPHAGOGASTRODUODENOSCOPY (EGD);  Surgeon: Ovidio Kin, MD;  [facility];  Service: General;  Laterality: N/A;  . GASTRIC BANDING PORT REVISION N/A 10/02/2014   Procedure: LAPROSCOPIC PLACEMENT OF LAP BAND PORT;  Surgeon: Glenna Fellows, MD;  [facility];  Service: General;  Laterality: N/A;  . LAPAROSCOPIC GASTRIC BANDING  03/2007   [facility] - [facility]; Dr Jorja Loa Hipp  . LAPAROSCOPIC REVISION OF GASTRIC BAND N/A 05/17/2012   Procedure: LAPAROSCOPIC REVISION OF SLIPPED GASTRIC BAND;  Surgeon: Mariella Saa, MD;  [facility];  Service: General;  Laterality: N/A;  . LAPAROSCOPY N/A 04/30/2014   Procedure: DIAGNOSTIC LAPAROSCOPY WITH REMOVAL OF INFECTED LAP BAND PORT WITH DEBRIDEMENT SUBCUTANEOUS ABSCESS;  Surgeon: Gaynelle Adu, MD;  [facility];  Service: General;  Laterality: N/A;  . VARICOSE VEIN SURGERY Bilateral 2009    Outpatient Medications Prior to Visit  Medication Sig Dispense Refill  . buPROPion (WELLBUTRIN XL) 300 MG 24 hr tablet Take 1 tablet (300 mg total) by mouth daily. 90 tablet 3  . cetirizine (ZYRTEC) 10 MG tablet Take 10 mg by mouth daily as needed for allergies (allergies).    . clonazePAM (KLONOPIN) 0.5 MG tablet 1 tab po qhs prn 30 tablet 5  . gabapentin (NEURONTIN) 300 MG capsule Take 900 mg by mouth at bedtime.    . Multiple Vitamin (MULITIVITAMIN WITH MINERALS) TABS Take 1 tablet by mouth daily.      . rizatriptan (MAXALT-MLT) 5 MG disintegrating tablet Take 1 tablet (5 mg total) by mouth as needed for migraine. May repeat in 2 hours if needed 9 tablet 3  . vitamin C  (ASCORBIC ACID) 500 MG tablet Take 500 mg by mouth.    Marland Kitchen. lisinopril (PRINIVIL,ZESTRIL) 30 MG tablet Take 1 tablet (30 mg total) by mouth daily. 30 tablet 6  . HYDROcodone-acetaminophen (NORCO) 10-325 MG tablet   0  . meloxicam (MOBIC) 15 MG tablet Take by mouth.     No facility-administered medications prior to visit.     Allergies  Allergen Reactions  . Vancomycin Itching and Anaphylaxis    Face/back warm and red.  Pt lips and tongue swelled, hives  . Demerol Hives and Nausea And Vomiting  . Meperidine Nausea And Vomiting and Hives  . Monosodium Glutamate Nausea And Vomiting and Other (See Comments)    migraines  . Tape Rash    Plastic tape    ROS As per HPI  PE: Blood pressure (!) 166/94, pulse 78, temperature 98.5 F (36.9 C), temperature source Oral, resp. rate 16, height 5\' 3"  (1.6 m), weight 248 lb (112.5 kg), last menstrual period 06/12/2016, SpO2 98 %. Gen: Alert, well appearing.  Patient is oriented to person, place, time, and situation. AFFECT: pleasant, lucid thought and speech.  However, she cries on/off during interview. CV: RRR, no m/r/g.   LUNGS: CTA bilat, nonlabored resps, good aeration in all lung fields.  LABS:    Chemistry      Component Value Date/Time   NA 136 03/21/2016 1156   K 4.3 03/21/2016 1156   CL 101 03/21/2016 1156   CO2 28 03/21/2016 1156   BUN 12 03/21/2016 1156   CREATININE 0.61 03/21/2016 1156      Component Value Date/Time   CALCIUM 9.1 03/21/2016 1156   ALKPHOS 54 04/19/2014 1837   AST 18 04/19/2014 1837   ALT 18 04/19/2014 1837   BILITOT 0.5 04/19/2014 1837     No results found for: CHOL, HDL, LDLCALC, LDLDIRECT, TRIG, CHOLHDL  Lab Results  Component Value Date   WBC 9.2 09/28/2014   HGB 12.2 09/28/2014   HCT 39.0 09/28/2014   MCV 85.5 09/28/2014   PLT 226 09/28/2014   No results found for: TSH Lab Results  Component Value Date   VITAMINB12 828 03/21/2016    IMPRESSION AND PLAN:  1) MDD, recurrent episode,  moderate severity.  Also with GAD with panic. She is much worse since hearing news that she needs back surgery. Continue wellbutrin xl 300mg  qd and add paxil 20mg  qd.  Continue clonazepam as hs/sleep aid and occ as prn med for panic. Therapeutic expectations and side effect profile of medication discussed today.  Patient's questions answered. She declines counseling at this time b/c she says she does not have enough time. She has a good support network.  2) HTN: not ideal control.  Likely some acute increases due  to excessive emotional stress and back pain lately.  Increase lisinopril to 40mg  qd.  Continue home bp monitoring.  An After Visit Summary was printed and given to the patient.  FOLLOW UP: Return in about 4 weeks (around 07/17/2016) for f/u mood/anx/HTN.  Signed:  Santiago Bumpers, MD           06/19/2016

## 2016-07-14 DIAGNOSIS — G894 Chronic pain syndrome: Secondary | ICD-10-CM | POA: Diagnosis not present

## 2016-07-14 DIAGNOSIS — M4726 Other spondylosis with radiculopathy, lumbar region: Secondary | ICD-10-CM | POA: Diagnosis not present

## 2016-07-14 DIAGNOSIS — M5106 Intervertebral disc disorders with myelopathy, lumbar region: Secondary | ICD-10-CM | POA: Diagnosis not present

## 2016-07-14 DIAGNOSIS — Z79891 Long term (current) use of opiate analgesic: Secondary | ICD-10-CM | POA: Diagnosis not present

## 2016-07-14 DIAGNOSIS — M5136 Other intervertebral disc degeneration, lumbar region: Secondary | ICD-10-CM | POA: Diagnosis not present

## 2016-07-17 ENCOUNTER — Encounter: Payer: Self-pay | Admitting: *Deleted

## 2016-07-17 DIAGNOSIS — F329 Major depressive disorder, single episode, unspecified: Secondary | ICD-10-CM | POA: Insufficient documentation

## 2016-07-17 DIAGNOSIS — F32A Depression, unspecified: Secondary | ICD-10-CM | POA: Insufficient documentation

## 2016-07-18 ENCOUNTER — Ambulatory Visit: Payer: Self-pay | Admitting: Family Medicine

## 2016-07-21 ENCOUNTER — Encounter: Payer: Self-pay | Admitting: Family Medicine

## 2016-07-24 ENCOUNTER — Telehealth: Payer: Self-pay | Admitting: Family Medicine

## 2016-07-24 NOTE — Telephone Encounter (Signed)
SW pt and she stated that Dr. Milinda CaveMcGowen was suppose to send in an new antidepressant to go with her bupropion and a new Rx for her lisinopril the 40mg  since they increased it.   I advised pt that Dr. Milinda CaveMcGowen did send in a Rx for paxil and a Rx for the lisinopril 40mg . I advised pt that I would call pharmacy and see if they received Rx.   SW [facility]resa [facility]Kelly at [facility] and she stated that they did receive Rx's but pt did not come by and pick them up so they put them back. I advised her to go ahead and get Rx's ready and pt would pick them up this afternoon.   I advised pt and she stated that she never received a text from them letting her know that the Rx's were ready. I advised her that the pharmacy is getting those Rx's ready.

## 2016-07-24 NOTE — Telephone Encounter (Signed)
Patient doesn't have her new medication. The pharmacy does not have the Rx. Please call her.

## 2016-07-25 IMAGING — CR DG CHEST 2V
2 series · 2 of 2 positions shown · non-contrast
Comparison: 11/25/2010

CLINICAL DATA: Postoperative chest pain since [REDACTED].

EXAM:
CHEST  2 VIEW

[w chest pa]
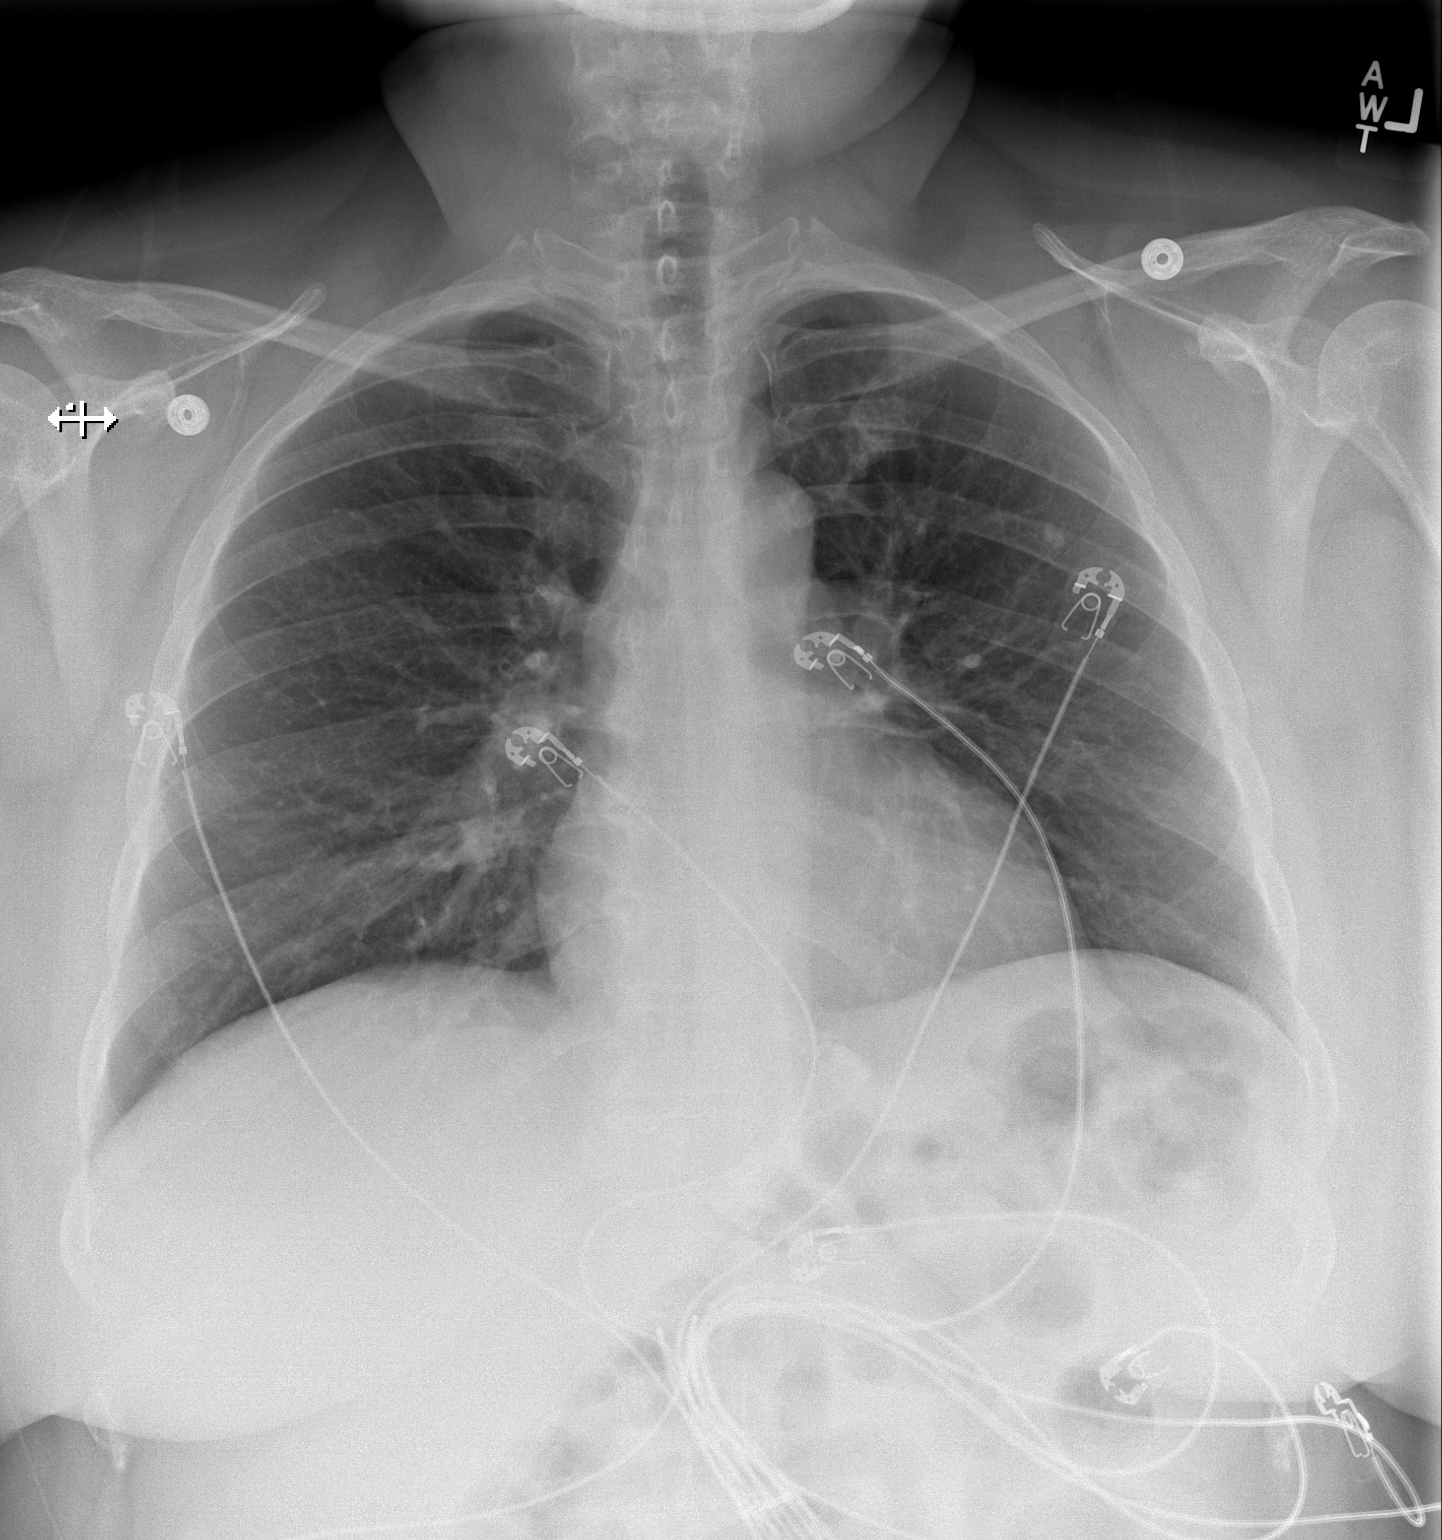

[w chest lat]
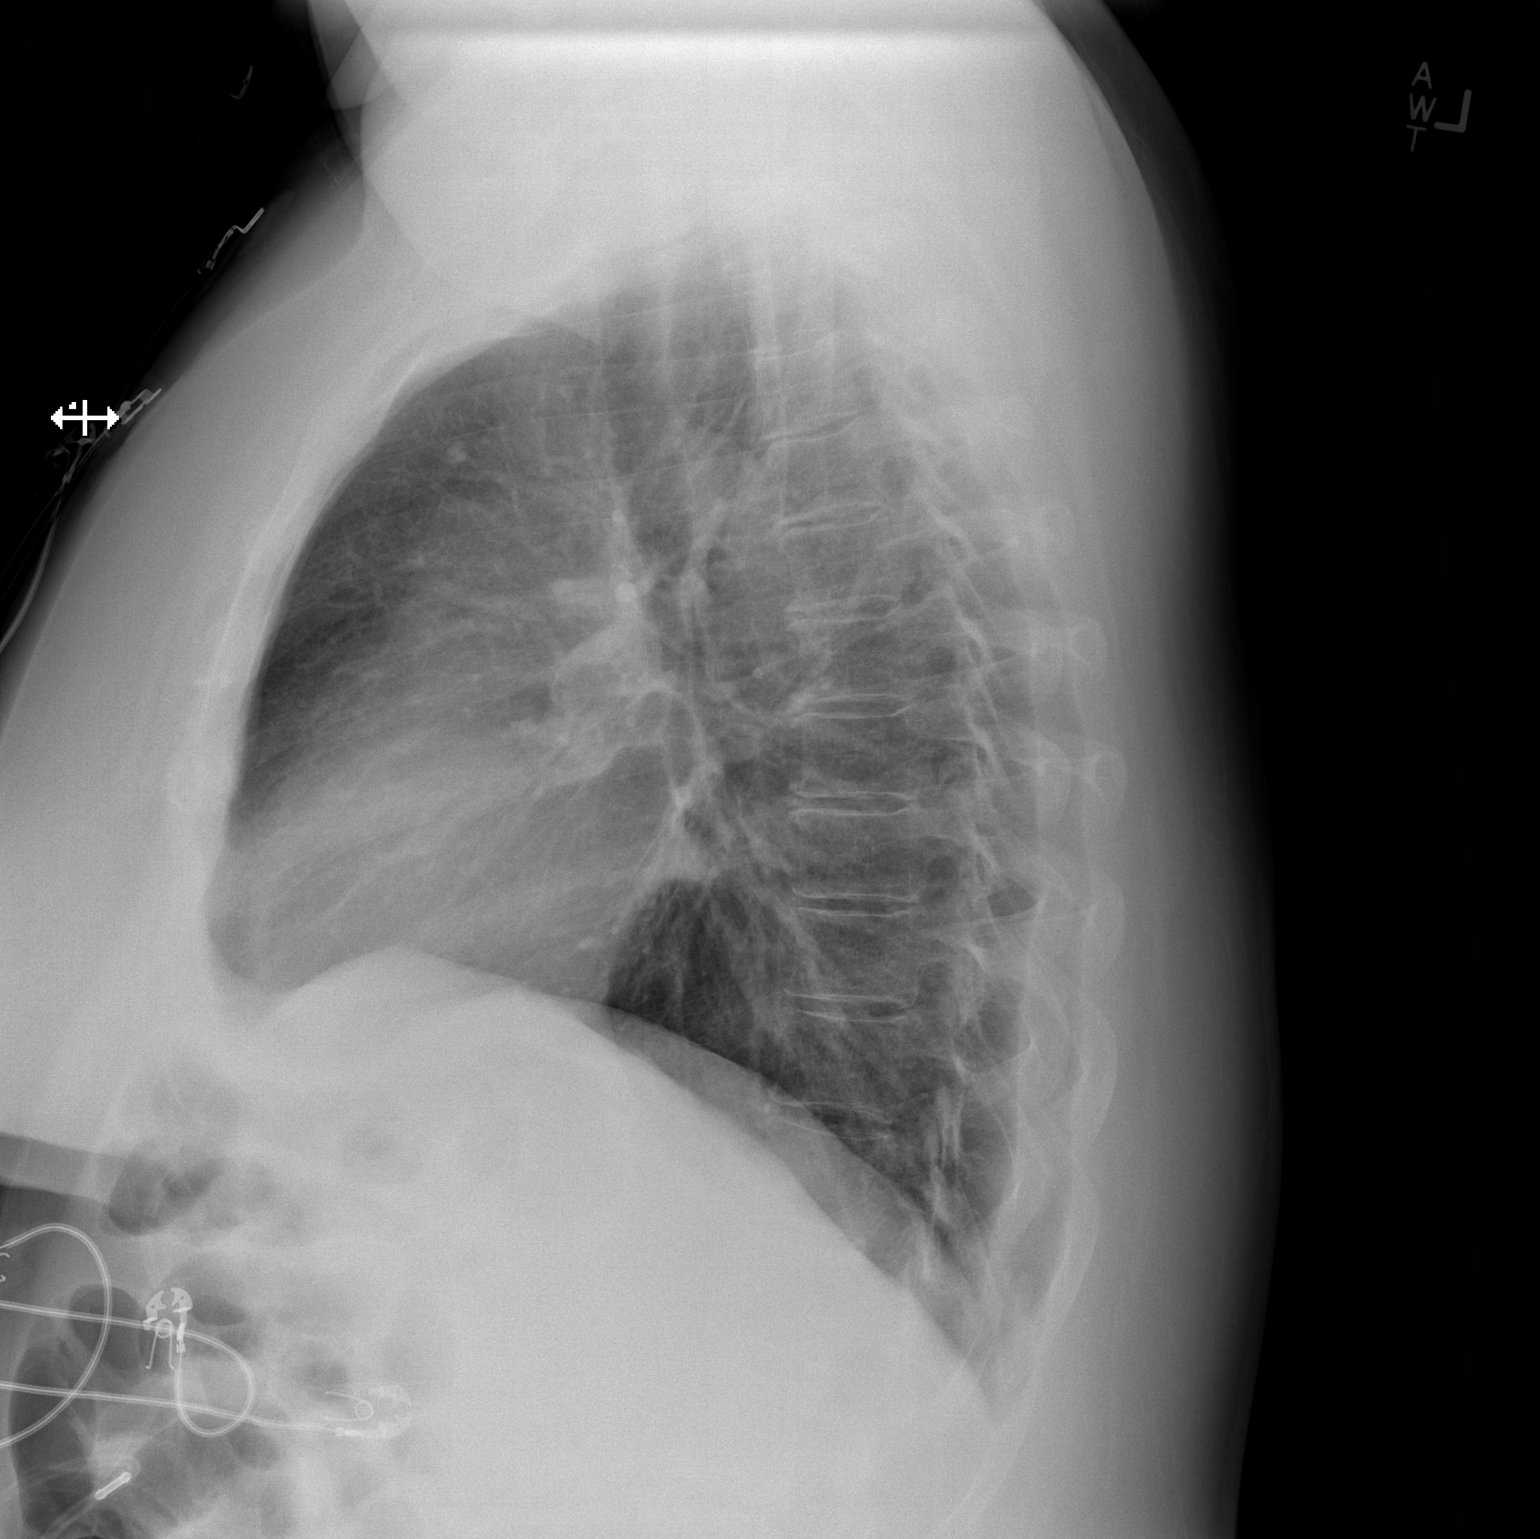

[2 of 2 positions shown; findings below may reference images not displayed]

FINDINGS: The heart size and mediastinal contours are within normal limits.
Both lungs are clear. The visualized skeletal structures are
unremarkable.
IMPRESSION: No acute cardiopulmonary findings.

## 2016-09-02 ENCOUNTER — Other Ambulatory Visit: Payer: Self-pay | Admitting: Family Medicine

## 2016-09-02 MED ORDER — LISINOPRIL 40 MG PO TABS: 40.0000 mg | ORAL_TABLET | Freq: Every day | ORAL | 5 refills | Status: DC

## 2016-09-02 NOTE — Telephone Encounter (Signed)
[facility] [address] 

## 2016-09-15 DIAGNOSIS — M5106 Intervertebral disc disorders with myelopathy, lumbar region: Secondary | ICD-10-CM | POA: Diagnosis not present

## 2016-09-15 DIAGNOSIS — M4726 Other spondylosis with radiculopathy, lumbar region: Secondary | ICD-10-CM | POA: Diagnosis not present

## 2016-09-15 DIAGNOSIS — Z79891 Long term (current) use of opiate analgesic: Secondary | ICD-10-CM | POA: Diagnosis not present

## 2016-09-15 DIAGNOSIS — M5136 Other intervertebral disc degeneration, lumbar region: Secondary | ICD-10-CM | POA: Diagnosis not present

## 2016-09-15 DIAGNOSIS — G894 Chronic pain syndrome: Secondary | ICD-10-CM | POA: Diagnosis not present

## 2016-09-15 DIAGNOSIS — M791 Myalgia: Secondary | ICD-10-CM | POA: Diagnosis not present

## 2016-11-06 DIAGNOSIS — G894 Chronic pain syndrome: Secondary | ICD-10-CM | POA: Diagnosis not present

## 2016-11-06 DIAGNOSIS — Z79891 Long term (current) use of opiate analgesic: Secondary | ICD-10-CM | POA: Diagnosis not present

## 2016-11-06 DIAGNOSIS — M5136 Other intervertebral disc degeneration, lumbar region: Secondary | ICD-10-CM | POA: Diagnosis not present

## 2016-11-06 DIAGNOSIS — M4726 Other spondylosis with radiculopathy, lumbar region: Secondary | ICD-10-CM | POA: Diagnosis not present

## 2016-11-06 DIAGNOSIS — M5106 Intervertebral disc disorders with myelopathy, lumbar region: Secondary | ICD-10-CM | POA: Diagnosis not present

## 2016-12-02 ENCOUNTER — Encounter (HOSPITAL_COMMUNITY): Payer: Self-pay

## 2016-12-25 DIAGNOSIS — Z79891 Long term (current) use of opiate analgesic: Secondary | ICD-10-CM | POA: Diagnosis not present

## 2016-12-25 DIAGNOSIS — M5136 Other intervertebral disc degeneration, lumbar region: Secondary | ICD-10-CM | POA: Diagnosis not present

## 2016-12-25 DIAGNOSIS — M4726 Other spondylosis with radiculopathy, lumbar region: Secondary | ICD-10-CM | POA: Diagnosis not present

## 2016-12-25 DIAGNOSIS — M5106 Intervertebral disc disorders with myelopathy, lumbar region: Secondary | ICD-10-CM | POA: Diagnosis not present

## 2016-12-25 DIAGNOSIS — M791 Myalgia, unspecified site: Secondary | ICD-10-CM | POA: Diagnosis not present

## 2016-12-25 DIAGNOSIS — G894 Chronic pain syndrome: Secondary | ICD-10-CM | POA: Diagnosis not present

## 2017-01-06 HISTORY — PX: BACK SURGERY: SHX140

## 2017-01-10 DIAGNOSIS — R110 Nausea: Secondary | ICD-10-CM | POA: Diagnosis not present

## 2017-01-10 DIAGNOSIS — G43009 Migraine without aura, not intractable, without status migrainosus: Secondary | ICD-10-CM | POA: Diagnosis not present

## 2017-01-16 ENCOUNTER — Ambulatory Visit: Payer: BLUE CROSS/BLUE SHIELD | Admitting: Family Medicine

## 2017-01-23 ENCOUNTER — Encounter: Payer: Self-pay | Admitting: Family Medicine

## 2017-01-23 ENCOUNTER — Ambulatory Visit (INDEPENDENT_AMBULATORY_CARE_PROVIDER_SITE_OTHER): Payer: BLUE CROSS/BLUE SHIELD | Admitting: Family Medicine

## 2017-01-23 VITALS — BP 136/92 | HR 65 | Resp 16 | Wt 264.0 lb

## 2017-01-23 DIAGNOSIS — M25532 Pain in left wrist: Secondary | ICD-10-CM

## 2017-01-23 DIAGNOSIS — F409 Phobic anxiety disorder, unspecified: Secondary | ICD-10-CM

## 2017-01-23 DIAGNOSIS — F5105 Insomnia due to other mental disorder: Secondary | ICD-10-CM | POA: Diagnosis not present

## 2017-01-23 DIAGNOSIS — M25432 Effusion, left wrist: Secondary | ICD-10-CM

## 2017-01-23 DIAGNOSIS — I10 Essential (primary) hypertension: Secondary | ICD-10-CM | POA: Diagnosis not present

## 2017-01-23 DIAGNOSIS — F3341 Major depressive disorder, recurrent, in partial remission: Secondary | ICD-10-CM

## 2017-01-23 MED ORDER — LISINOPRIL 40 MG PO TABS: 40.0000 mg | ORAL_TABLET | Freq: Every day | ORAL | 3 refills | Status: DC

## 2017-01-23 MED ORDER — CLONAZEPAM 0.5 MG PO TABS: ORAL_TABLET | ORAL | 1 refills | Status: DC

## 2017-01-23 NOTE — Progress Notes (Signed)
OFFICE VISIT  01/23/2017   CC:  Chief Complaint  Patient presents with  . Follow-up    RCI     HPI:    Patient is a 42 y.o. Caucasian female who presents for 7 mo f/u GAD and MDD--added paxil 20mg  to wellbutrin xl 300mg  at last visit. Continued clonazepam as hs sleep med and occasionally as prn med for panic.  She declined counseling at that time. She reports that when she added the paxil she felt weird and paranoid.  She stopped taking it and this feeling went away. She has adjusted better to her new job/responsibilities. Recent death of a coworker and friend--this has her down some lately. She ran out of clonazepam a couple weeks ago.  She was taking this med pretty regularly for help with sleep, but never took it in daytime. She is about to go out of work for back surgery---around 05/2017.  Back pain inhibiting her from doing any exercise and from doing certain physical things at work.  Neurosurgeon --Dr. Alvester Morin.  Has f/u appt with him next week. HTN:  Of note, she has a chronic pain syndrome which is managed by pain specialist, Dr. Oneal Grout.  Gets periodic urine drug screens there.  Onset 3 weeks ago, left wrist pain along distal 1/3 of ulna, feels a knot.  The pain ends up making her drop things from hand. No radiation of pain, no paresthesias. No trauma or strain recalled prior to onset. Taking diclofenac.  HTN: checks two times per week and it is 130/80s.  Past Medical History:  Diagnosis Date  . Arthritis   . Carpal tunnel syndrome    right wrist, numb all the time  . Cervical dysplasia   . Chronic back pain    Degenerative lumbar spondylosis with grade 2 spondylolisthesis L4 on L5 with bilater pars defects L4.   Marland Kitchen Chronic bronchitis (HCC)    hx of  . DDD (degenerative disc disease), lumbar    MRI 10/2015: grade I (7mm) anterolisthesis L4 on L5, w/ disc herniation with encroachment on spinal nerves at L4-S1 levels.  . Depression   . History of fracture of phalanx of  finger    left, 4th finger distal phalanx  . History of non anemic vitamin B12 deficiency    s/p lap band surgery per pt report.  . Hyperlipidemia   . Hypertension    off all bp meds x 3-4 years  . LAP-BAND surgery status   . Migraine   . Ovarian cyst 10/2015   LEFT, 4 cm--noted on L spine MRI w/out contrast.  F/u u/s imaging showed simple cyst.  . Spondylarthrosis     Past Surgical History:  Procedure Laterality Date  . CARPAL TUNNEL RELEASE Left   . CERVICAL CONE BIOPSY    . COLONOSCOPY     2015  . ESOPHAGOGASTRODUODENOSCOPY N/A 04/27/2014   Procedure: ESOPHAGOGASTRODUODENOSCOPY (EGD);  Surgeon: Ovidio Kin, MD;  [facility];  Service: General;  Laterality: N/A;  . GASTRIC BANDING PORT REVISION N/A 10/02/2014   Procedure: LAPROSCOPIC PLACEMENT OF LAP BAND PORT;  Surgeon: Glenna Fellows, MD;  [facility]: [facility];  Service: General;  Laterality: N/A;  . LAPAROSCOPIC GASTRIC BANDING  03/2007   [facility]; Dr Jorja Loa Hipp  . LAPAROSCOPIC REVISION OF GASTRIC BAND N/A 05/17/2012   Procedure: LAPAROSCOPIC REVISION OF SLIPPED GASTRIC BAND;  Surgeon: Mariella Saa, MD;  [facility];  Service: General;  Laterality: N/A;  . LAPAROSCOPY N/A 04/30/2014  Procedure: DIAGNOSTIC LAPAROSCOPY WITH REMOVAL OF INFECTED LAP BAND PORT WITH DEBRIDEMENT SUBCUTANEOUS ABSCESS;  Surgeon: Gaynelle Adu, MD;  Location: [facility];  Service: General;  Laterality: N/A;  . VARICOSE VEIN SURGERY Bilateral 2009    Outpatient Medications Prior to Visit  Medication Sig Dispense Refill  . buPROPion (WELLBUTRIN XL) 300 MG 24 hr tablet Take 1 tablet (300 mg total) by mouth daily. 90 tablet 3  . cetirizine (ZYRTEC) 10 MG tablet Take 10 mg by mouth daily as needed for allergies (allergies).    . diclofenac (VOLTAREN) 75 MG EC tablet Take 1 tablet by mouth daily.    Marland Kitchen gabapentin (NEURONTIN) 800 MG tablet Take 800 mg by mouth 3 (three) times daily.  6  . morphine (MS CONTIN) 30 MG 12  hr tablet Take 1 tablet by mouth every 12 (twelve) hours as needed.    Marland Kitchen morphine (MSIR) 15 MG tablet Take 1 tablet by mouth every 12 (twelve) hours as needed.    . Multiple Vitamin (MULITIVITAMIN WITH MINERALS) TABS Take 1 tablet by mouth daily.      . rizatriptan (MAXALT-MLT) 5 MG disintegrating tablet Take 1 tablet (5 mg total) by mouth as needed for migraine. May repeat in 2 hours if needed 9 tablet 3  . vitamin C (ASCORBIC ACID) 500 MG tablet Take 500 mg by mouth.    Marland Kitchen lisinopril (PRINIVIL,ZESTRIL) 40 MG tablet Take 1 tablet (40 mg total) by mouth daily. 30 tablet 5  . gabapentin (NEURONTIN) 300 MG capsule Take 900 mg by mouth at bedtime.    . clonazePAM (KLONOPIN) 0.5 MG tablet 1 tab po qhs prn (Patient not taking: Reported on 01/23/2017) 30 tablet 5  . PARoxetine (PAXIL) 20 MG tablet Take 1 tablet (20 mg total) by mouth daily. (Patient not taking: Reported on 01/23/2017) 30 tablet 1   No facility-administered medications prior to visit.     Allergies  Allergen Reactions  . Vancomycin Itching and Anaphylaxis    Face/back warm and red.  Pt lips and tongue swelled, hives  . Demerol Hives and Nausea And Vomiting  . Meperidine Nausea And Vomiting and Hives  . Monosodium Glutamate Nausea And Vomiting and Other (See Comments)    migraines  . Tape Rash    Plastic tape    ROS As per HPI  PE: Blood pressure (!) 136/92, pulse 65, resp. rate 16, weight 264 lb (119.7 kg), SpO2 98 %. Gen: Alert, well appearing.  Patient is oriented to person, place, time, and situation. AFFECT: pleasant, lucid thought and speech. CV: RRR, no m/r/g.   LUNGS: CTA bilat, nonlabored resps, good aeration in all lung fields. EXT: 1+ pitting edema bilat LLs Left forearm/wrist: no forearm bony or soft tissue tenderness or swelling or deformity. She has a soft tissue swelling focally at the very distal aspect of the ulna on lateral, dorsal region--tenderness in this spot as well.  This does not feel like a distinct  nodule and it does not move with wrist or finger ROM.  NO change in tenderness when forearm pronated or supinated.  No overlying erythema.  Hand/fingers strength 5/5 bilat.  N/V intact.  LABS:    Chemistry      Component Value Date/Time   NA 136 03/21/2016 1156   K 4.3 03/21/2016 1156   CL 101 03/21/2016 1156   CO2 28 03/21/2016 1156   BUN 12 03/21/2016 1156   CREATININE 0.61 03/21/2016 1156      Component Value Date/Time   CALCIUM 9.1  03/21/2016 1156   ALKPHOS 54 04/19/2014 1837   AST 18 04/19/2014 1837   ALT 18 04/19/2014 1837   BILITOT 0.5 04/19/2014 1837       IMPRESSION AND PLAN:  1) HTN: The current medical regimen is effective;  continue present plan and medications. BMET at next f/u in 6 mo for CPE.  2) MDD, partial remission, also with GAD/anxiety induced insomnia. She wants to stick with current med regimen of wellbutrin xl 300 mg qd and clonazepam 0.5mg  bid prn (mostly hs--but even then she does not take it EVERY single night---almost NEVER during the daytime). Controlled substance contract reviewed with pt, signed, and put in chart today. We'll get her pain mgmt MD to fax us copies of her periodic UDS's done there.  3) Left distal ulna/wrist swelling and pain: no trauma/strain recalled. There is an indistinct swelling of soft tissue in the region but I don't know what this is. I'll check x-ray to make sure no bony abnormality in the region. Additionally, will refer to sports medicine MD, Dr. Berline Choughigby, for further help with evaluation and management of this problem.  An After Visit Summary was printed and given to the patient.  FOLLOW UP: Return in about 1 year (around 01/23/2018) for annual CPE (fasting).  Signed:  Santiago BumpersPhil Gareth Fitzner, MD           01/23/2017

## 2017-02-12 DIAGNOSIS — M4316 Spondylolisthesis, lumbar region: Secondary | ICD-10-CM | POA: Diagnosis not present

## 2017-02-12 DIAGNOSIS — Z01818 Encounter for other preprocedural examination: Secondary | ICD-10-CM | POA: Diagnosis not present

## 2017-02-12 DIAGNOSIS — M4306 Spondylolysis, lumbar region: Secondary | ICD-10-CM | POA: Diagnosis not present

## 2017-02-16 ENCOUNTER — Encounter: Payer: Self-pay | Admitting: Family Medicine

## 2017-02-16 ENCOUNTER — Ambulatory Visit: Payer: Self-pay

## 2017-02-16 DIAGNOSIS — Z5321 Procedure and treatment not carried out due to patient leaving prior to being seen by health care provider: Secondary | ICD-10-CM | POA: Diagnosis not present

## 2017-02-16 DIAGNOSIS — E860 Dehydration: Secondary | ICD-10-CM | POA: Diagnosis not present

## 2017-02-16 NOTE — Telephone Encounter (Signed)
  Reason for Disposition . Patient sounds very sick or weak to the triager  Answer Assessment - Initial Assessment Questions 1. LOCATION: "Where does it hurt?"      Behind her eyes 2. ONSET: "When did the headache start?" (Minutes, hours or days)      Started Friday. 3. PATTERN: "Does the pain come and go, or has it been constant since it started?"     Constant 4. SEVERITY: "How bad is the pain?" and "What does it keep you from doing?"  (e.g., Scale 1-10; mild, moderate, or severe)   - MILD (1-3): doesn't interfere with normal activities    - MODERATE (4-7): interferes with normal activities or awakens from sleep    - SEVERE (8-10): excruciating pain, unable to do any normal activities        6 5. RECURRENT SYMPTOM: "Have you ever had headaches before?" If so, ask: "When was the last time?" and "What happened that time?"      Yes 6. CAUSE: "What do you think is causing the headache?"     Migraines 7. MIGRAINE: "Have you been diagnosed with migraine headaches?" If so, ask: "Is this headache similar?"      Yes 8. HEAD INJURY: "Has there been any recent injury to the head?"      No 9. OTHER SYMPTOMS: "Do you have any other symptoms?" (fever, stiff neck, eye pain, sore throat, cold symptoms)     Headache 10. PREGNANCY: "Is there any chance you are pregnant?" "When was your last menstrual period?"       No  Protocols used: HEADACHE-A-AH

## 2017-03-05 DIAGNOSIS — M5106 Intervertebral disc disorders with myelopathy, lumbar region: Secondary | ICD-10-CM | POA: Diagnosis not present

## 2017-03-05 DIAGNOSIS — Z79891 Long term (current) use of opiate analgesic: Secondary | ICD-10-CM | POA: Diagnosis not present

## 2017-03-05 DIAGNOSIS — G894 Chronic pain syndrome: Secondary | ICD-10-CM | POA: Diagnosis not present

## 2017-03-05 DIAGNOSIS — M4726 Other spondylosis with radiculopathy, lumbar region: Secondary | ICD-10-CM | POA: Diagnosis not present

## 2017-03-05 DIAGNOSIS — M5136 Other intervertebral disc degeneration, lumbar region: Secondary | ICD-10-CM | POA: Diagnosis not present

## 2017-03-19 ENCOUNTER — Other Ambulatory Visit: Payer: Self-pay | Admitting: Family Medicine

## 2017-04-02 ENCOUNTER — Telehealth: Payer: Self-pay | Admitting: *Deleted

## 2017-04-02 DIAGNOSIS — G894 Chronic pain syndrome: Secondary | ICD-10-CM | POA: Diagnosis not present

## 2017-04-02 DIAGNOSIS — M791 Myalgia, unspecified site: Secondary | ICD-10-CM | POA: Diagnosis not present

## 2017-04-02 NOTE — Telephone Encounter (Signed)
Noted  

## 2017-04-02 NOTE — Telephone Encounter (Signed)
FYI

## 2017-04-02 NOTE — Telephone Encounter (Signed)
Copied from CRM [phone]. Topic: General - Other >> Apr 01, 2017  5:09 PM Debroah LoopLander, Lumin L wrote: Reason for CRM: Elonda Huskyassandra, w [employer] calling to let Dr. Milinda CaveMcGowen  know that the patient has [facility] and will be receiving education on her health care issues over the next 2-3 mos.

## 2017-04-03 ENCOUNTER — Encounter: Payer: Self-pay | Admitting: Family Medicine

## 2017-04-08 DIAGNOSIS — M4316 Spondylolisthesis, lumbar region: Secondary | ICD-10-CM | POA: Diagnosis not present

## 2017-04-08 DIAGNOSIS — M5126 Other intervertebral disc displacement, lumbar region: Secondary | ICD-10-CM | POA: Diagnosis not present

## 2017-04-08 DIAGNOSIS — Z01818 Encounter for other preprocedural examination: Secondary | ICD-10-CM | POA: Diagnosis not present

## 2017-04-08 DIAGNOSIS — M5136 Other intervertebral disc degeneration, lumbar region: Secondary | ICD-10-CM | POA: Diagnosis not present

## 2017-04-08 DIAGNOSIS — M5125 Other intervertebral disc displacement, thoracolumbar region: Secondary | ICD-10-CM | POA: Diagnosis not present

## 2017-04-22 DIAGNOSIS — M5136 Other intervertebral disc degeneration, lumbar region: Secondary | ICD-10-CM | POA: Diagnosis not present

## 2017-04-22 DIAGNOSIS — M4726 Other spondylosis with radiculopathy, lumbar region: Secondary | ICD-10-CM | POA: Diagnosis not present

## 2017-04-22 DIAGNOSIS — G894 Chronic pain syndrome: Secondary | ICD-10-CM | POA: Diagnosis not present

## 2017-04-22 DIAGNOSIS — M791 Myalgia, unspecified site: Secondary | ICD-10-CM | POA: Diagnosis not present

## 2017-04-22 DIAGNOSIS — Z79891 Long term (current) use of opiate analgesic: Secondary | ICD-10-CM | POA: Diagnosis not present

## 2017-04-22 DIAGNOSIS — M5106 Intervertebral disc disorders with myelopathy, lumbar region: Secondary | ICD-10-CM | POA: Diagnosis not present

## 2017-04-27 ENCOUNTER — Encounter: Payer: Self-pay | Admitting: Family Medicine

## 2017-04-27 ENCOUNTER — Encounter: Payer: BLUE CROSS/BLUE SHIELD | Admitting: Family Medicine

## 2017-04-27 NOTE — Progress Notes (Deleted)
Office Note 04/27/2017  CC: No chief complaint on file.   HPI:  Samantha Doyle is a 42 y.o. White female who is here for annual health maintenance exam. GYN MD?-- No mammogram or Pap in EMR.  Past Medical History:  Diagnosis Date  . Arthritis   . Carpal tunnel syndrome    right wrist, numb all the time  . Cervical dysplasia   . Chronic back pain    Degenerative lumbar spondylosis with grade 2 spondylolisthesis L4 on L5 with bilater pars defects L4.   Marland Kitchen Chronic bronchitis (HCC)    hx of  . DDD (degenerative disc disease), lumbar    MRI 10/2015: grade I (7mm) anterolisthesis L4 on L5, w/ disc herniation with encroachment on spinal nerves at L4-S1 levels.  . Depression   . History of fracture of phalanx of finger    left, 4th finger distal phalanx  . History of non anemic vitamin B12 deficiency    s/p lap band surgery per pt report.  . Hyperlipidemia   . Hypertension    off all bp meds x 3-4 years  . LAP-BAND surgery status   . Migraine syndrome   . Ovarian cyst 10/2015   LEFT, 4 cm--noted on L spine MRI w/out contrast.  F/u u/s imaging showed simple cyst.  . Spondylarthrosis     Past Surgical History:  Procedure Laterality Date  . CARPAL TUNNEL RELEASE Left   . CERVICAL CONE BIOPSY    . COLONOSCOPY     2015  . ESOPHAGOGASTRODUODENOSCOPY N/A 04/27/2014   Procedure: ESOPHAGOGASTRODUODENOSCOPY (EGD);  Surgeon: Ovidio Kin, MD;  Location: [facility];  Service: General;  Laterality: N/A;  . GASTRIC BANDING PORT REVISION N/A 10/02/2014   Procedure: LAPROSCOPIC PLACEMENT OF LAP BAND PORT;  Surgeon: Glenna Fellows, MD;  Location: [facility];  Service: General;  Laterality: N/A;  . LAPAROSCOPIC GASTRIC BANDING  03/2007   [facility] - [facility], [place]; Dr Jorja Loa Hipp  . LAPAROSCOPIC REVISION OF GASTRIC BAND N/A 05/17/2012   Procedure: LAPAROSCOPIC REVISION OF SLIPPED GASTRIC BAND;  Surgeon: Mariella Saa, MD;  Location: [facility];  Service: General;  Laterality:  N/A;  . LAPAROSCOPY N/A 04/30/2014   Procedure: DIAGNOSTIC LAPAROSCOPY WITH REMOVAL OF INFECTED LAP BAND PORT WITH DEBRIDEMENT SUBCUTANEOUS ABSCESS;  Surgeon: Gaynelle Adu, MD;  Location: [facility];  Service: General;  Laterality: N/A;  . VARICOSE VEIN SURGERY Bilateral 2009    Family History  Problem Relation Age of Onset  . Alcohol abuse Mother   . Drug abuse Mother   . Arthritis Mother   . Cancer Mother   . Hyperlipidemia Mother   . Heart disease Mother   . Hypertension Mother   . Non-Hodgkin's lymphoma Mother   . Alcohol abuse Father   . Arthritis Father   . Hyperlipidemia Father   . Heart disease Father   . Stroke Father   . Hypertension Father   . Mental illness Father   . Diabetes Father   . Hyperlipidemia Sister   . Hypertension Sister   . Arthritis Maternal Grandmother   . Cancer Maternal Grandmother   . Hypertension Maternal Grandmother   . Arthritis Maternal Grandfather   . Arthritis Paternal Grandmother   . Cancer Paternal Grandmother   . Hyperlipidemia Paternal Grandmother   . Heart disease Paternal Grandmother   . Hypertension Paternal Grandmother   . Diabetes Paternal Grandmother   . Arthritis Paternal Grandfather   . Hyperlipidemia Paternal Grandfather   . Heart disease Paternal Grandfather   .  Stroke Paternal Grandfather   . Hypertension Paternal Grandfather     Social History   Socioeconomic History  . Marital status: Single    Spouse name: Not on file  . Number of children: Not on file  . Years of education: Not on file  . Highest education level: Not on file  Occupational History  . Not on file  Social Needs  . Financial resource strain: Not on file  . Food insecurity:    Worry: Not on file    Inability: Not on file  . Transportation needs:    Medical: Not on file    Non-medical: Not on file  Tobacco Use  . Smoking status: Former Smoker    Packs/day: 0.25    Years: 5.00    Pack years: 1.25    Types: Cigarettes  . Smokeless tobacco:  Never Used  Substance and Sexual Activity  . Alcohol use: No  . Drug use: No  . Sexual activity: Yes    Birth control/protection: None  Lifestyle  . Physical activity:    Days per week: Not on file    Minutes per session: Not on file  . Stress: Not on file  Relationships  . Social connections:    Talks on phone: Not on file    Gets together: Not on file    Attends religious service: Not on file    Active member of club or organization: Not on file    Attends meetings of clubs or organizations: Not on file    Relationship status: Not on file  . Intimate partner violence:    Fear of current or ex partner: Not on file    Emotionally abused: Not on file    Physically abused: Not on file    Forced sexual activity: Not on file  Other Topics Concern  . Not on file  Social History Narrative   Single, no children.   Occup: Retail bankerAircraft mechanic   Tobacco: 6 pack-yr hx--quit with wellbutrin.   No alcohol or drugs.    Outpatient Medications Prior to Visit  Medication Sig Dispense Refill  . buPROPion (WELLBUTRIN XL) 300 MG 24 hr tablet TAKE 1 TABLET BY MOUTH DAILY. 90 tablet 1  . cetirizine (ZYRTEC) 10 MG tablet Take 10 mg by mouth daily as needed for allergies (allergies).    . clonazePAM (KLONOPIN) 0.5 MG tablet 1 tab po qhs prn 90 tablet 1  . diclofenac (VOLTAREN) 75 MG EC tablet Take 1 tablet by mouth daily.    Marland Kitchen. gabapentin (NEURONTIN) 300 MG capsule Take 900 mg by mouth at bedtime.    . gabapentin (NEURONTIN) 800 MG tablet Take 800 mg by mouth 3 (three) times daily.  6  . lisinopril (PRINIVIL,ZESTRIL) 40 MG tablet Take 1 tablet (40 mg total) by mouth daily. 90 tablet 3  . morphine (MS CONTIN) 30 MG 12 hr tablet Take 1 tablet by mouth every 12 (twelve) hours as needed.    Marland Kitchen. morphine (MSIR) 15 MG tablet Take 1 tablet by mouth every 12 (twelve) hours as needed.    . Multiple Vitamin (MULITIVITAMIN WITH MINERALS) TABS Take 1 tablet by mouth daily.      . rizatriptan (MAXALT-MLT) 5 MG  disintegrating tablet Take 1 tablet (5 mg total) by mouth as needed for migraine. May repeat in 2 hours if needed 9 tablet 3  . vitamin C (ASCORBIC ACID) 500 MG tablet Take 500 mg by mouth.     No facility-administered medications prior to visit.  Allergies  Allergen Reactions  . Vancomycin Itching and Anaphylaxis    Face/back warm and red.  Pt lips and tongue swelled, hives  . Demerol Hives and Nausea And Vomiting  . Meperidine Nausea And Vomiting and Hives  . Monosodium Glutamate Nausea And Vomiting and Other (See Comments)    migraines  . Tape Rash    Plastic tape    ROS *** PE; There were no vitals taken for this visit. *** Pertinent labs:  No results found for: TSH Lab Results  Component Value Date   WBC 9.2 09/28/2014   HGB 12.2 09/28/2014   HCT 39.0 09/28/2014   MCV 85.5 09/28/2014   PLT 226 09/28/2014   Lab Results  Component Value Date   CREATININE 0.61 03/21/2016   BUN 12 03/21/2016   NA 136 03/21/2016   K 4.3 03/21/2016   CL 101 03/21/2016   CO2 28 03/21/2016   Lab Results  Component Value Date   ALT 18 04/19/2014   AST 18 04/19/2014   ALKPHOS 54 04/19/2014   BILITOT 0.5 04/19/2014   No results found for: CHOL No results found for: HDL No results found for: LDLCALC No results found for: TRIG No results found for: CHOLHDL   ASSESSMENT AND PLAN:   Health maintenance exam: Reviewed age and gender appropriate health maintenance issues (prudent diet, regular exercise, health risks of tobacco and excessive alcohol, use of seatbelts, fire alarms in home, use of sunscreen).  Also reviewed age and gender appropriate health screening as well as vaccine recommendations. Vaccines: Tdap due Labs: fasting HP. Cervical ca screening: Breast ca screening: Colon ca screening: average risk patient= start screening at age 64 yrs.  An After Visit Summary was printed and given to the patient.   FOLLOW UP:  No follow-ups on file.  Signed:  Santiago Bumpers,  MD           04/27/2017

## 2017-05-05 ENCOUNTER — Encounter: Payer: Self-pay | Admitting: Family Medicine

## 2017-05-05 DIAGNOSIS — Z01810 Encounter for preprocedural cardiovascular examination: Secondary | ICD-10-CM | POA: Diagnosis not present

## 2017-05-05 DIAGNOSIS — G5601 Carpal tunnel syndrome, right upper limb: Secondary | ICD-10-CM | POA: Diagnosis not present

## 2017-05-05 DIAGNOSIS — Z01812 Encounter for preprocedural laboratory examination: Secondary | ICD-10-CM | POA: Diagnosis not present

## 2017-05-05 DIAGNOSIS — Z01818 Encounter for other preprocedural examination: Secondary | ICD-10-CM | POA: Diagnosis not present

## 2017-05-05 DIAGNOSIS — M549 Dorsalgia, unspecified: Secondary | ICD-10-CM | POA: Diagnosis not present

## 2017-05-05 DIAGNOSIS — M4316 Spondylolisthesis, lumbar region: Secondary | ICD-10-CM | POA: Diagnosis not present

## 2017-05-18 DIAGNOSIS — M4316 Spondylolisthesis, lumbar region: Secondary | ICD-10-CM | POA: Diagnosis not present

## 2017-05-18 DIAGNOSIS — G5601 Carpal tunnel syndrome, right upper limb: Secondary | ICD-10-CM | POA: Diagnosis not present

## 2017-05-19 DIAGNOSIS — K567 Ileus, unspecified: Secondary | ICD-10-CM | POA: Diagnosis not present

## 2017-05-19 DIAGNOSIS — M4316 Spondylolisthesis, lumbar region: Secondary | ICD-10-CM | POA: Diagnosis not present

## 2017-05-19 DIAGNOSIS — M4326 Fusion of spine, lumbar region: Secondary | ICD-10-CM | POA: Diagnosis not present

## 2017-05-19 DIAGNOSIS — Z6841 Body Mass Index (BMI) 40.0 and over, adult: Secondary | ICD-10-CM | POA: Diagnosis not present

## 2017-05-19 DIAGNOSIS — M4306 Spondylolysis, lumbar region: Secondary | ICD-10-CM | POA: Diagnosis not present

## 2017-05-19 DIAGNOSIS — G5601 Carpal tunnel syndrome, right upper limb: Secondary | ICD-10-CM | POA: Diagnosis not present

## 2017-05-19 DIAGNOSIS — I10 Essential (primary) hypertension: Secondary | ICD-10-CM | POA: Diagnosis not present

## 2017-05-19 DIAGNOSIS — F329 Major depressive disorder, single episode, unspecified: Secondary | ICD-10-CM | POA: Diagnosis not present

## 2017-05-19 DIAGNOSIS — F419 Anxiety disorder, unspecified: Secondary | ICD-10-CM | POA: Diagnosis not present

## 2017-05-19 DIAGNOSIS — G8929 Other chronic pain: Secondary | ICD-10-CM | POA: Diagnosis not present

## 2017-05-19 DIAGNOSIS — M5442 Lumbago with sciatica, left side: Secondary | ICD-10-CM | POA: Diagnosis not present

## 2017-05-19 DIAGNOSIS — Z79899 Other long term (current) drug therapy: Secondary | ICD-10-CM | POA: Diagnosis not present

## 2017-05-19 DIAGNOSIS — Z4789 Encounter for other orthopedic aftercare: Secondary | ICD-10-CM | POA: Diagnosis not present

## 2017-05-19 DIAGNOSIS — Z981 Arthrodesis status: Secondary | ICD-10-CM | POA: Diagnosis not present

## 2017-05-19 DIAGNOSIS — M1990 Unspecified osteoarthritis, unspecified site: Secondary | ICD-10-CM | POA: Diagnosis not present

## 2017-05-20 DIAGNOSIS — Z4789 Encounter for other orthopedic aftercare: Secondary | ICD-10-CM | POA: Diagnosis not present

## 2017-05-20 DIAGNOSIS — G5601 Carpal tunnel syndrome, right upper limb: Secondary | ICD-10-CM | POA: Diagnosis not present

## 2017-05-20 DIAGNOSIS — M4306 Spondylolysis, lumbar region: Secondary | ICD-10-CM | POA: Diagnosis not present

## 2017-05-20 DIAGNOSIS — Z981 Arthrodesis status: Secondary | ICD-10-CM | POA: Diagnosis not present

## 2017-05-21 DIAGNOSIS — M4316 Spondylolisthesis, lumbar region: Secondary | ICD-10-CM | POA: Diagnosis not present

## 2017-05-24 DIAGNOSIS — M4316 Spondylolisthesis, lumbar region: Secondary | ICD-10-CM | POA: Diagnosis not present

## 2017-05-26 MED ORDER — SENNA 8.6 MG PO TABS
2.00 | ORAL_TABLET | ORAL | Status: DC
Start: 2017-05-24 — End: 2017-05-26

## 2017-05-26 MED ORDER — CLONAZEPAM 0.5 MG PO TABS
0.50 | ORAL_TABLET | ORAL | Status: DC
Start: ? — End: 2017-05-26

## 2017-05-26 MED ORDER — FAMOTIDINE 20 MG PO TABS
20.00 | ORAL_TABLET | ORAL | Status: DC
Start: 2017-05-24 — End: 2017-05-26

## 2017-05-26 MED ORDER — DIPHENHYDRAMINE HCL 50 MG/ML IJ SOLN
12.50 | INTRAMUSCULAR | Status: DC
Start: ? — End: 2017-05-26

## 2017-05-26 MED ORDER — DOCUSATE SODIUM 100 MG PO CAPS
100.00 | ORAL_CAPSULE | ORAL | Status: DC
Start: 2017-05-24 — End: 2017-05-26

## 2017-05-26 MED ORDER — PROMETHAZINE HCL 25 MG PO TABS
25.00 | ORAL_TABLET | ORAL | Status: DC
Start: ? — End: 2017-05-26

## 2017-05-26 MED ORDER — ENOXAPARIN SODIUM 40 MG/0.4ML ~~LOC~~ SOLN
40.00 | SUBCUTANEOUS | Status: DC
Start: 2017-05-25 — End: 2017-05-26

## 2017-05-26 MED ORDER — CYCLOBENZAPRINE HCL 10 MG PO TABS
10.00 | ORAL_TABLET | ORAL | Status: DC
Start: ? — End: 2017-05-26

## 2017-05-26 MED ORDER — ONDANSETRON HCL 4 MG/2ML IJ SOLN
4.00 | INTRAMUSCULAR | Status: DC
Start: ? — End: 2017-05-26

## 2017-05-26 MED ORDER — SUMATRIPTAN SUCCINATE 50 MG PO TABS
50.00 | ORAL_TABLET | ORAL | Status: DC
Start: ? — End: 2017-05-26

## 2017-05-26 MED ORDER — MORPHINE SULFATE ER 30 MG PO TBCR
30.00 | EXTENDED_RELEASE_TABLET | ORAL | Status: DC
Start: 2017-05-24 — End: 2017-05-26

## 2017-05-26 MED ORDER — ACETAMINOPHEN 325 MG PO TABS
650.00 | ORAL_TABLET | ORAL | Status: DC
Start: ? — End: 2017-05-26

## 2017-05-26 MED ORDER — GABAPENTIN 400 MG PO CAPS
800.00 | ORAL_CAPSULE | ORAL | Status: DC
Start: 2017-05-24 — End: 2017-05-26

## 2017-05-26 MED ORDER — HYDRALAZINE HCL 20 MG/ML IJ SOLN
10.00 | INTRAMUSCULAR | Status: DC
Start: ? — End: 2017-05-26

## 2017-05-26 MED ORDER — THERA PO TABS
1.00 | ORAL_TABLET | ORAL | Status: DC
Start: 2017-05-25 — End: 2017-05-26

## 2017-05-26 MED ORDER — BENZOCAINE-MENTHOL 15-3.6 MG MT LOZG
1.00 | LOZENGE | OROMUCOSAL | Status: DC
Start: ? — End: 2017-05-26

## 2017-05-26 MED ORDER — ALUM & MAG HYDROXIDE-SIMETH 200-200-20 MG/5ML PO SUSP
30.00 | ORAL | Status: DC
Start: ? — End: 2017-05-26

## 2017-05-26 MED ORDER — DIPHENHYDRAMINE HCL 25 MG PO CAPS
25.00 | ORAL_CAPSULE | ORAL | Status: DC
Start: ? — End: 2017-05-26

## 2017-05-26 MED ORDER — MORPHINE SULFATE 15 MG PO TABS
15.00 | ORAL_TABLET | ORAL | Status: DC
Start: ? — End: 2017-05-26

## 2017-05-26 MED ORDER — MUPIROCIN 2 % EX OINT
TOPICAL_OINTMENT | CUTANEOUS | Status: DC
Start: 2017-05-24 — End: 2017-05-26

## 2017-05-26 MED ORDER — MORPHINE SULFATE (PF) 4 MG/ML IV SOLN
2.00 | INTRAVENOUS | Status: DC
Start: ? — End: 2017-05-26

## 2017-05-26 MED ORDER — LACTATED RINGERS IV SOLN
75.00 | INTRAVENOUS | Status: DC
Start: ? — End: 2017-05-26

## 2017-05-26 MED ORDER — ACETAMINOPHEN 650 MG RE SUPP
650.00 | RECTAL | Status: DC
Start: ? — End: 2017-05-26

## 2017-05-26 MED ORDER — LISINOPRIL 20 MG PO TABS
40.00 | ORAL_TABLET | ORAL | Status: DC
Start: 2017-05-25 — End: 2017-05-26

## 2017-05-26 MED ORDER — BUPROPION HCL ER (XL) 150 MG PO TB24
300.00 | ORAL_TABLET | ORAL | Status: DC
Start: 2017-05-25 — End: 2017-05-26

## 2017-05-26 MED ORDER — MORPHINE SULFATE (PF) 4 MG/ML IV SOLN
4.00 | INTRAVENOUS | Status: DC
Start: ? — End: 2017-05-26

## 2017-05-26 MED ORDER — ACETAMINOPHEN 500 MG PO TABS
1000.00 | ORAL_TABLET | ORAL | Status: DC
Start: ? — End: 2017-05-26

## 2017-05-26 MED ORDER — LORATADINE 10 MG PO TABS
10.00 | ORAL_TABLET | ORAL | Status: DC
Start: ? — End: 2017-05-26

## 2017-05-29 DIAGNOSIS — E669 Obesity, unspecified: Secondary | ICD-10-CM | POA: Diagnosis not present

## 2017-05-29 DIAGNOSIS — F419 Anxiety disorder, unspecified: Secondary | ICD-10-CM | POA: Diagnosis not present

## 2017-05-29 DIAGNOSIS — I8390 Asymptomatic varicose veins of unspecified lower extremity: Secondary | ICD-10-CM | POA: Diagnosis not present

## 2017-05-29 DIAGNOSIS — Z87891 Personal history of nicotine dependence: Secondary | ICD-10-CM | POA: Diagnosis not present

## 2017-05-29 DIAGNOSIS — I10 Essential (primary) hypertension: Secondary | ICD-10-CM | POA: Diagnosis not present

## 2017-05-29 DIAGNOSIS — Z79891 Long term (current) use of opiate analgesic: Secondary | ICD-10-CM | POA: Diagnosis not present

## 2017-05-29 DIAGNOSIS — M4316 Spondylolisthesis, lumbar region: Secondary | ICD-10-CM | POA: Diagnosis not present

## 2017-05-29 DIAGNOSIS — Z6841 Body Mass Index (BMI) 40.0 and over, adult: Secondary | ICD-10-CM | POA: Diagnosis not present

## 2017-05-29 DIAGNOSIS — Z981 Arthrodesis status: Secondary | ICD-10-CM | POA: Diagnosis not present

## 2017-05-29 DIAGNOSIS — M4326 Fusion of spine, lumbar region: Secondary | ICD-10-CM | POA: Diagnosis not present

## 2017-05-29 DIAGNOSIS — Z4789 Encounter for other orthopedic aftercare: Secondary | ICD-10-CM | POA: Diagnosis not present

## 2017-05-29 DIAGNOSIS — Z9884 Bariatric surgery status: Secondary | ICD-10-CM | POA: Diagnosis not present

## 2017-05-29 DIAGNOSIS — F329 Major depressive disorder, single episode, unspecified: Secondary | ICD-10-CM | POA: Diagnosis not present

## 2017-06-24 DIAGNOSIS — M545 Low back pain: Secondary | ICD-10-CM | POA: Diagnosis not present

## 2017-06-24 DIAGNOSIS — Z981 Arthrodesis status: Secondary | ICD-10-CM | POA: Diagnosis not present

## 2017-06-29 DIAGNOSIS — M5106 Intervertebral disc disorders with myelopathy, lumbar region: Secondary | ICD-10-CM | POA: Diagnosis not present

## 2017-06-29 DIAGNOSIS — G894 Chronic pain syndrome: Secondary | ICD-10-CM | POA: Diagnosis not present

## 2017-06-29 DIAGNOSIS — M4726 Other spondylosis with radiculopathy, lumbar region: Secondary | ICD-10-CM | POA: Diagnosis not present

## 2017-06-29 DIAGNOSIS — Z79891 Long term (current) use of opiate analgesic: Secondary | ICD-10-CM | POA: Diagnosis not present

## 2017-06-29 DIAGNOSIS — M5136 Other intervertebral disc degeneration, lumbar region: Secondary | ICD-10-CM | POA: Diagnosis not present

## 2017-07-20 ENCOUNTER — Encounter: Payer: BLUE CROSS/BLUE SHIELD | Admitting: Family Medicine

## 2017-07-20 DIAGNOSIS — Z0289 Encounter for other administrative examinations: Secondary | ICD-10-CM

## 2017-07-20 NOTE — Progress Notes (Deleted)
Office Note 07/20/2017  CC: No chief complaint on file.   HPI:  Samantha Doyle is a 42 y.o. White female who is here for annual health maintenance exam.   Past Medical History:  Diagnosis Date  . Arthritis   . Carpal tunnel syndrome    right wrist, numb all the time  . Cervical dysplasia   . Chronic back pain    Degenerative lumbar spondylosis with grade 2 spondylolisthesis L4 on L5 with bilater pars defects L4.   Marland Kitchen. Chronic bronchitis (HCC)    hx of  . DDD (degenerative disc disease), lumbar    MRI 10/2015: grade I (7mm) anterolisthesis L4 on L5, w/ disc herniation with encroachment on spinal nerves at L4-S1 levels.  . Depression   . History of fracture of phalanx of finger    left, 4th finger distal phalanx  . History of non anemic vitamin B12 deficiency    s/p lap band surgery per pt report.  . Hyperlipidemia   . Hypertension    off all bp meds x 3-4 years  . LAP-BAND surgery status   . Migraine syndrome   . Ovarian cyst 10/2015   LEFT, 4 cm--noted on L spine MRI w/out contrast.  F/u u/s imaging showed simple cyst.  . Spondylarthrosis     Past Surgical History:  Procedure Laterality Date  . CARPAL TUNNEL RELEASE Left   . CERVICAL CONE BIOPSY    . COLONOSCOPY     2015  . ESOPHAGOGASTRODUODENOSCOPY N/A 04/27/2014   Procedure: ESOPHAGOGASTRODUODENOSCOPY (EGD);  Surgeon: Ovidio Kinavid Newman, MD;  [facility];  Service: General;  Laterality: N/A;  . GASTRIC BANDING PORT REVISION N/A 10/02/2014   Procedure: LAPROSCOPIC PLACEMENT OF LAP BAND PORT;  Surgeon: Glenna FellowsBenjamin Hoxworth, MD;  Location: [facility];  Service: General;  Laterality: N/A;  . LAPAROSCOPIC GASTRIC BANDING  03/2007   [facility] Dr Jorja Loaim Hipp  . LAPAROSCOPIC REVISION OF GASTRIC BAND N/A 05/17/2012   Procedure: LAPAROSCOPIC REVISION OF SLIPPED GASTRIC BAND;  Surgeon: Mariella SaaBenjamin T Hoxworth, MD;  Location: [facility];  Service: General;  Laterality: N/A;  . LAPAROSCOPY N/A 04/30/2014   Procedure: DIAGNOSTIC LAPAROSCOPY WITH REMOVAL OF INFECTED LAP BAND PORT WITH DEBRIDEMENT SUBCUTANEOUS ABSCESS;  Surgeon: Gaynelle AduEric Wilson, MD;  Location: [facility];  Service: General;  Laterality: N/A;  . VARICOSE VEIN SURGERY Bilateral 2009    Family History  Problem Relation Age of Onset  . Alcohol abuse Mother   . Drug abuse Mother   . Arthritis Mother   . Cancer Mother   . Hyperlipidemia Mother   . Heart disease Mother   . Hypertension Mother   . Non-Hodgkin's lymphoma Mother   . Alcohol abuse Father   . Arthritis Father   . Hyperlipidemia Father   . Heart disease Father   . Stroke Father   . Hypertension Father   . Mental illness Father   . Diabetes Father   . Hyperlipidemia Sister   . Hypertension Sister   . Arthritis Maternal Grandmother   . Cancer Maternal Grandmother   . Hypertension Maternal Grandmother   . Arthritis Maternal Grandfather   . Arthritis Paternal Grandmother   . Cancer Paternal Grandmother   . Hyperlipidemia Paternal Grandmother   . Heart disease Paternal Grandmother   . Hypertension Paternal Grandmother   . Diabetes Paternal Grandmother   . Arthritis Paternal Grandfather   . Hyperlipidemia Paternal Grandfather   . Heart disease Paternal Grandfather   . Stroke Paternal Grandfather   .  Hypertension Paternal Grandfather     Social History   Socioeconomic History  . Marital status: Single    Spouse name: Not on file  . Number of children: Not on file  . Years of education: Not on file  . Highest education level: Not on file  Occupational History  . Not on file  Social Needs  . Financial resource strain: Not on file  . Food insecurity:    Worry: Not on file    Inability: Not on file  . Transportation needs:    Medical: Not on file    Non-medical: Not on file  Tobacco Use  . Smoking status: Former Smoker    Packs/day: 0.25    Years: 5.00    Pack years: 1.25    Types: Cigarettes  . Smokeless tobacco: Never Used  Substance and Sexual  Activity  . Alcohol use: No  . Drug use: No  . Sexual activity: Yes    Birth control/protection: None  Lifestyle  . Physical activity:    Days per week: Not on file    Minutes per session: Not on file  . Stress: Not on file  Relationships  . Social connections:    Talks on phone: Not on file    Gets together: Not on file    Attends religious service: Not on file    Active member of club or organization: Not on file    Attends meetings of clubs or organizations: Not on file    Relationship status: Not on file  . Intimate partner violence:    Fear of current or ex partner: Not on file    Emotionally abused: Not on file    Physically abused: Not on file    Forced sexual activity: Not on file  Other Topics Concern  . Not on file  Social History Narrative   Single, no children.   Occup: Retail banker   Tobacco: 6 pack-yr hx--quit with wellbutrin.   No alcohol or drugs.    Outpatient Medications Prior to Visit  Medication Sig Dispense Refill  . buPROPion (WELLBUTRIN XL) 300 MG 24 hr tablet TAKE 1 TABLET BY MOUTH DAILY. 90 tablet 1  . cetirizine (ZYRTEC) 10 MG tablet Take 10 mg by mouth daily as needed for allergies (allergies).    . clonazePAM (KLONOPIN) 0.5 MG tablet 1 tab po qhs prn 90 tablet 1  . diclofenac (VOLTAREN) 75 MG EC tablet Take 1 tablet by mouth daily.    Marland Kitchen gabapentin (NEURONTIN) 300 MG capsule Take 900 mg by mouth at bedtime.    . gabapentin (NEURONTIN) 800 MG tablet Take 800 mg by mouth 3 (three) times daily.  6  . lisinopril (PRINIVIL,ZESTRIL) 40 MG tablet Take 1 tablet (40 mg total) by mouth daily. 90 tablet 3  . morphine (MS CONTIN) 30 MG 12 hr tablet Take 1 tablet by mouth every 12 (twelve) hours as needed.    Marland Kitchen morphine (MSIR) 15 MG tablet Take 1 tablet by mouth every 12 (twelve) hours as needed.    . Multiple Vitamin (MULITIVITAMIN WITH MINERALS) TABS Take 1 tablet by mouth daily.      . rizatriptan (MAXALT-MLT) 5 MG disintegrating tablet Take 1 tablet  (5 mg total) by mouth as needed for migraine. May repeat in 2 hours if needed 9 tablet 3  . vitamin C (ASCORBIC ACID) 500 MG tablet Take 500 mg by mouth.     No facility-administered medications prior to visit.     Allergies  Allergen  Reactions  . Vancomycin Itching and Anaphylaxis    Face/back warm and red.  Pt lips and tongue swelled, hives  . Demerol Hives and Nausea And Vomiting  . Meperidine Nausea And Vomiting and Hives  . Monosodium Glutamate Nausea And Vomiting and Other (See Comments)    migraines  . Tape Rash    Plastic tape    ROS *** PE; There were no vitals taken for this visit. *** Pertinent labs:  No results found for: TSH Lab Results  Component Value Date   WBC 9.2 09/28/2014   HGB 12.2 09/28/2014   HCT 39.0 09/28/2014   MCV 85.5 09/28/2014   PLT 226 09/28/2014   Lab Results  Component Value Date   CREATININE 0.61 03/21/2016   BUN 12 03/21/2016   NA 136 03/21/2016   K 4.3 03/21/2016   CL 101 03/21/2016   CO2 28 03/21/2016   Lab Results  Component Value Date   ALT 18 04/19/2014   AST 18 04/19/2014   ALKPHOS 54 04/19/2014   BILITOT 0.5 04/19/2014   No results found for: CHOL No results found for: HDL No results found for: LDLCALC No results found for: TRIG No results found for: CHOLHDL No results found for: UEAV4U   ASSESSMENT AND PLAN:   No problem-specific Assessment & Plan notes found for this encounter.   FOLLOW UP:  No follow-ups on file.

## 2017-08-07 ENCOUNTER — Ambulatory Visit (INDEPENDENT_AMBULATORY_CARE_PROVIDER_SITE_OTHER): Payer: BLUE CROSS/BLUE SHIELD | Admitting: Family Medicine

## 2017-08-07 ENCOUNTER — Other Ambulatory Visit: Payer: Self-pay

## 2017-08-07 ENCOUNTER — Encounter: Payer: Self-pay | Admitting: Family Medicine

## 2017-08-07 VITALS — BP 133/82 | HR 102 | Temp 98.3°F | Resp 16 | Ht 62.75 in | Wt 274.0 lb

## 2017-08-07 DIAGNOSIS — I10 Essential (primary) hypertension: Secondary | ICD-10-CM | POA: Diagnosis not present

## 2017-08-07 DIAGNOSIS — Z0000 Encounter for general adult medical examination without abnormal findings: Secondary | ICD-10-CM

## 2017-08-07 DIAGNOSIS — E7800 Pure hypercholesterolemia, unspecified: Secondary | ICD-10-CM | POA: Diagnosis not present

## 2017-08-07 DIAGNOSIS — Z1231 Encounter for screening mammogram for malignant neoplasm of breast: Secondary | ICD-10-CM

## 2017-08-07 DIAGNOSIS — Z1239 Encounter for other screening for malignant neoplasm of breast: Secondary | ICD-10-CM

## 2017-08-07 MED ORDER — RIZATRIPTAN BENZOATE 5 MG PO TBDP: 5.0000 mg | ORAL_TABLET | ORAL | 3 refills | Status: DC | PRN

## 2017-08-07 MED ORDER — CLONAZEPAM 0.5 MG PO TABS: ORAL_TABLET | ORAL | 1 refills | Status: DC

## 2017-08-07 NOTE — Patient Instructions (Signed)

## 2017-08-07 NOTE — Progress Notes (Signed)
Office Note 08/07/2017  CC:  Chief Complaint  Patient presents with  . Annual Exam    HPI:  Samantha Doyle is a 42 y.o. White female who is here for annual health maintenance exam. GYN MD:  Got back 05/19/17: L-4 thru S1 discectomy with fusion: Dr. Kendell Bane with [facility].  Also got carpal tunnel release at that time (right). Her pain is much better and she is happy![name] off of morphine (not on ER anymore, next month will be off short acting morphine) with the help of her pain med MD, Dr. Oneal Grout. Planning tentatively on return to work 01/2018.  GAD: she is feeling more anxious lately b/c of coming off the morphine-->has been on pain med so long. Also, worried she may hurt her back again. She ran out of the [place] a week ago.  Uses this med twice per day and it helps.  Past Medical History:  Diagnosis Date  . Arthritis   . Carpal tunnel syndrome    right wrist, numb all the time  . Cervical dysplasia   . Chronic back pain    Degenerative lumbar spondylosis with grade 2 spondylolisthesis L4 on L5 with bilater pars defects L4.   Marland Kitchen Chronic bronchitis (HCC)    hx of  . DDD (degenerative disc disease), lumbar    MRI 10/2015: grade I (7mm) anterolisthesis L4 on L5, w/ disc herniation with encroachment on spinal nerves at L4-S1 levels.  . Depression   . History of fracture of phalanx of finger    left, 4th finger distal phalanx  . History of non anemic vitamin B12 deficiency    s/p lap band surgery per pt report.  . Hyperlipidemia   . Hypertension    off all bp meds x 3-4 years  . LAP-BAND surgery status   . Migraine syndrome   . Ovarian cyst 10/2015   LEFT, 4 cm--noted on L spine MRI w/out contrast.  F/u u/s imaging showed simple cyst.  . Spondylarthrosis     Past Surgical History:  Procedure Laterality Date  . CARPAL TUNNEL RELEASE Left   . CERVICAL CONE BIOPSY    . COLONOSCOPY     2015  . ESOPHAGOGASTRODUODENOSCOPY N/A 04/27/2014   Procedure:  ESOPHAGOGASTRODUODENOSCOPY (EGD);  Surgeon: Ovidio Kin, MD;  Location: [facility];  Service: General;  Laterality: N/A;  . GASTRIC BANDING PORT REVISION N/A 10/02/2014   Procedure: LAPROSCOPIC PLACEMENT OF LAP BAND PORT;  Surgeon: Glenna Fellows, MD;  Location: [facility];  Service: General;  Laterality: N/A;  . LAPAROSCOPIC GASTRIC BANDING  03/2007   [facility] - [facility]; Dr Jorja Loa Hipp  . LAPAROSCOPIC REVISION OF GASTRIC BAND N/A 05/17/2012   Procedure: LAPAROSCOPIC REVISION OF SLIPPED GASTRIC BAND;  Surgeon: Mariella Saa, MD;  Location: [facility];  Service: General;  Laterality: N/A;  . LAPAROSCOPY N/A 04/30/2014   Procedure: DIAGNOSTIC LAPAROSCOPY WITH REMOVAL OF INFECTED LAP BAND PORT WITH DEBRIDEMENT SUBCUTANEOUS ABSCESS;  Surgeon: Gaynelle Adu, MD;  Location: [facility];  Service: General;  Laterality: N/A;  . VARICOSE VEIN SURGERY Bilateral 2009    Family History  Problem Relation Age of Onset  . Alcohol abuse Mother   . Drug abuse Mother   . Arthritis Mother   . Cancer Mother   . Hyperlipidemia Mother   . Heart disease Mother   . Hypertension Mother   . Non-Hodgkin's lymphoma Mother   . Alcohol abuse Father   . Arthritis Father   . Hyperlipidemia Father   .  Heart disease Father   . Stroke Father   . Hypertension Father   . Mental illness Father   . Diabetes Father   . Hyperlipidemia Sister   . Hypertension Sister   . Arthritis Maternal Grandmother   . Cancer Maternal Grandmother   . Hypertension Maternal Grandmother   . Arthritis Maternal Grandfather   . Arthritis Paternal Grandmother   . Cancer Paternal Grandmother   . Hyperlipidemia Paternal Grandmother   . Heart disease Paternal Grandmother   . Hypertension Paternal Grandmother   . Diabetes Paternal Grandmother   . Arthritis Paternal Grandfather   . Hyperlipidemia Paternal Grandfather   . Heart disease Paternal Grandfather   . Stroke Paternal Grandfather   . Hypertension Paternal Grandfather      Social History   Socioeconomic History  . Marital status: Single    Spouse name: Not on file  . Number of children: Not on file  . Years of education: Not on file  . Highest education level: Not on file  Occupational History  . Not on file  Social Needs  . Financial resource strain: Not on file  . Food insecurity:    Worry: Not on file    Inability: Not on file  . Transportation needs:    Medical: Not on file    Non-medical: Not on file  Tobacco Use  . Smoking status: Former Smoker    Packs/day: 0.25    Years: 5.00    Pack years: 1.25    Types: Cigarettes  . Smokeless tobacco: Never Used  Substance and Sexual Activity  . Alcohol use: No  . Drug use: No  . Sexual activity: Yes    Birth control/protection: None  Lifestyle  . Physical activity:    Days per week: Not on file    Minutes per session: Not on file  . Stress: Not on file  Relationships  . Social connections:    Talks on phone: Not on file    Gets together: Not on file    Attends religious service: Not on file    Active member of club or organization: Not on file    Attends meetings of clubs or organizations: Not on file    Relationship status: Not on file  . Intimate partner violence:    Fear of current or ex partner: Not on file    Emotionally abused: Not on file    Physically abused: Not on file    Forced sexual activity: Not on file  Other Topics Concern  . Not on file  Social History Narrative   Single, no children.   Occup: Retail banker   Tobacco: 6 pack-yr hx--quit with wellbutrin.   No alcohol or drugs.    Outpatient Medications Prior to Visit  Medication Sig Dispense Refill  . buPROPion (WELLBUTRIN XL) 300 MG 24 hr tablet TAKE 1 TABLET BY MOUTH DAILY. 90 tablet 1  . cetirizine (ZYRTEC) 10 MG tablet Take 10 mg by mouth daily as needed for allergies (allergies).    . diclofenac (VOLTAREN) 75 MG EC tablet Take 1 tablet by mouth 2 (two) times daily.     Marland Kitchen gabapentin (NEURONTIN) 800  MG tablet Take 800 mg by mouth 3 (three) times daily.  6  . lisinopril (PRINIVIL,ZESTRIL) 40 MG tablet Take 1 tablet (40 mg total) by mouth daily. 90 tablet 3  . morphine (MS CONTIN) 30 MG 12 hr tablet morphine ER 30 mg tablet,extended release    . morphine (MSIR) 15 MG tablet Take  1 tablet by mouth every 12 (twelve) hours as needed.    . Multiple Vitamin (MULITIVITAMIN WITH MINERALS) TABS Take 1 tablet by mouth daily.      . clonazePAM (KLONOPIN) 0.5 MG tablet 1 tab po qhs prn 90 tablet 1  . gabapentin (NEURONTIN) 300 MG capsule Take 900 mg by mouth at bedtime.    Marland Kitchen morphine (MS CONTIN) 30 MG 12 hr tablet Take 1 tablet by mouth every 12 (twelve) hours as needed.    . rizatriptan (MAXALT-MLT) 5 MG disintegrating tablet Take 1 tablet (5 mg total) by mouth as needed for migraine. May repeat in 2 hours if needed 9 tablet 3  . vitamin C (ASCORBIC ACID) 500 MG tablet Take 500 mg by mouth.     No facility-administered medications prior to visit.     Allergies  Allergen Reactions  . Vancomycin Itching and Anaphylaxis    Face/back warm and red.  Pt lips and tongue swelled, hives  . Demerol Hives and Nausea And Vomiting  . Meperidine Nausea And Vomiting and Hives  . Monosodium Glutamate Nausea And Vomiting and Other (See Comments)    migraines  . Tape Rash    Plastic tape    ROS Review of Systems  Constitutional: Negative for appetite change, chills, fatigue and fever.  HENT: Negative for congestion, dental problem, ear pain and sore throat.   Eyes: Negative for discharge, redness and visual disturbance.  Respiratory: Negative for cough, chest tightness, shortness of breath and wheezing.   Cardiovascular: Negative for chest pain, palpitations and leg swelling.  Gastrointestinal: Negative for abdominal pain, blood in stool, diarrhea, nausea and vomiting.  Genitourinary: Negative for difficulty urinating, dysuria, flank pain, frequency, hematuria and urgency.  Musculoskeletal: Negative for  arthralgias, back pain, joint swelling, myalgias and neck stiffness.  Skin: Negative for pallor and rash.  Neurological: Negative for dizziness, speech difficulty, weakness and headaches.  Hematological: Negative for adenopathy. Does not bruise/bleed easily.  Psychiatric/Behavioral: Negative for confusion and sleep disturbance. The patient is not nervous/anxious.     PE; Blood pressure 133/82, pulse (!) 102, temperature 98.3 F (36.8 C), temperature source Oral, resp. rate 16, height 5' 2.75" (1.594 m), weight 274 lb (124.3 kg), last menstrual period 07/10/2017, SpO2 97 %. Female (nurse) chaperone present for exam today. Gen: Alert, well appearing.  Patient is oriented to person, place, time, and situation. AFFECT: pleasant, lucid thought and speech. ENT: Ears: EACs clear, normal epithelium.  TMs with good light reflex and landmarks bilaterally.  Eyes: no injection, icteris, swelling, or exudate.  EOMI, PERRLA. Nose: no drainage or turbinate edema/swelling.  No injection or focal lesion.  Mouth: lips without lesion/swelling.  Oral mucosa pink and moist.  Dentition intact and without obvious caries or gingival swelling.  Oropharynx without erythema, exudate, or swelling.  Neck: supple/nontender.  No LAD, mass, or TM.  Carotid pulses 2+ bilaterally, without bruits. CV: RRR, no m/r/g.   LUNGS: CTA bilat, nonlabored resps, good aeration in all lung fields. ABD: soft, NT, ND, BS normal.  No hepatospenomegaly or mass.  No bruits. EXT: no clubbing, cyanosis, or edema.  Musculoskeletal: no joint swelling, erythema, warmth, or tenderness.  ROM of all joints intact. Skin - no sores or suspicious lesions or rashes or color changes  Pertinent labs:  No results found for: TSH Lab Results  Component Value Date   WBC 9.2 09/28/2014   HGB 12.2 09/28/2014   HCT 39.0 09/28/2014   MCV 85.5 09/28/2014   PLT 226 09/28/2014  Lab Results  Component Value Date   CREATININE 0.61 03/21/2016   BUN 12  03/21/2016   NA 136 03/21/2016   K 4.3 03/21/2016   CL 101 03/21/2016   CO2 28 03/21/2016   Lab Results  Component Value Date   ALT 18 04/19/2014   AST 18 04/19/2014   ALKPHOS 54 04/19/2014   BILITOT 0.5 04/19/2014   No results found for: CHOL No results found for: HDL No results found for: LDLCALC No results found for: TRIG No results found for: CHOLHDL   ASSESSMENT AND PLAN:   1) Health maintenance exam: Reviewed age and gender appropriate health maintenance issues (prudent diet, regular exercise, health risks of tobacco and excessive alcohol, use of seatbelts, fire alarms in home, use of sunscreen).  Also reviewed age and gender appropriate health screening as well as vaccine recommendations. Vaccines: Tdap UTD 2014. Labs: fasting HP labs (had 1 regular coke 2 hours ago).   Cervical ca screening: Hx of cervical dysplasia approx 2 decades ago.  Says paps neg since that time.  Most recent pap was about 3-4 yrs ago---due-->she'll come back in 6 mo for this. Breast ca screening: she is overdue.  [facility]. Colon ca screening: average risk patient= start screening at age 42 yrs.  2) GAD: a bit of increased anxiety lately.  Continue wellbutrin at current dosing. Will eRx clonazepam 0.5mg  for her to take bid prn, #180, RF x 1. CSC in chart--dated 01/23/17.  An After Visit Summary was printed and given to the patient.  FOLLOW UP:  Return in about 6 months (around 02/07/2018) for f/u HTN AND get PAP/PELVIC--30 min.  Signed:  Santiago BumpersPhil Tashawn Laswell, MD           08/07/2017

## 2017-08-08 LAB — CBC WITH DIFFERENTIAL/PLATELET
BASOS ABS: 27 {cells}/uL (ref 0–200)
Basophils Relative: 0.3 %
EOS PCT: 0 %
Eosinophils Absolute: 0 cells/uL — ABNORMAL LOW (ref 15–500)
HCT: 37.8 % (ref 35.0–45.0)
Hemoglobin: 11.7 g/dL (ref 11.7–15.5)
Lymphs Abs: 2216 cells/uL (ref 850–3900)
MCH: 24.9 pg — ABNORMAL LOW (ref 27.0–33.0)
MCHC: 31 g/dL — AB (ref 32.0–36.0)
MCV: 80.4 fL (ref 80.0–100.0)
MONOS PCT: 8.7 %
MPV: 10.3 fL (ref 7.5–12.5)
NEUTROS ABS: 5883 {cells}/uL (ref 1500–7800)
Neutrophils Relative %: 66.1 %
Platelets: 301 10*3/uL (ref 140–400)
RBC: 4.7 10*6/uL (ref 3.80–5.10)
RDW: 15.1 % — AB (ref 11.0–15.0)
Total Lymphocyte: 24.9 %
WBC mixed population: 774 cells/uL (ref 200–950)
WBC: 8.9 10*3/uL (ref 3.8–10.8)

## 2017-08-08 LAB — LIPID PANEL
Cholesterol: 216 mg/dL — ABNORMAL HIGH (ref <200)
HDL: 52 mg/dL (ref >50)
LDL Cholesterol (Calc): 130 mg/dL (calc) — ABNORMAL HIGH
Non-HDL Cholesterol (Calc): 164 mg/dL (calc) — ABNORMAL HIGH (ref <130)
Total CHOL/HDL Ratio: 4.2 (calc) (ref <5.0)
Triglycerides: 204 mg/dL — ABNORMAL HIGH (ref <150)

## 2017-08-08 LAB — TSH: TSH: 1.97 mIU/L

## 2017-08-08 LAB — COMPREHENSIVE METABOLIC PANEL
AG RATIO: 1.3 (calc) (ref 1.0–2.5)
ALBUMIN MSPROF: 4.1 g/dL (ref 3.6–5.1)
ALT: 18 U/L (ref 6–29)
AST: 14 U/L (ref 10–30)
Alkaline phosphatase (APISO): 81 U/L (ref 33–115)
BILIRUBIN TOTAL: 0.5 mg/dL (ref 0.2–1.2)
BUN: 9 mg/dL (ref 7–25)
CALCIUM: 9.6 mg/dL (ref 8.6–10.2)
CHLORIDE: 100 mmol/L (ref 98–110)
CO2: 28 mmol/L (ref 20–32)
Creat: 0.61 mg/dL (ref 0.50–1.10)
GLOBULIN: 3.2 g/dL (ref 1.9–3.7)
Glucose, Bld: 92 mg/dL (ref 65–99)
POTASSIUM: 4 mmol/L (ref 3.5–5.3)
SODIUM: 137 mmol/L (ref 135–146)
TOTAL PROTEIN: 7.3 g/dL (ref 6.1–8.1)

## 2017-08-09 ENCOUNTER — Encounter: Payer: Self-pay | Admitting: Family Medicine

## 2017-08-10 ENCOUNTER — Encounter: Payer: Self-pay | Admitting: *Deleted

## 2017-10-25 ENCOUNTER — Other Ambulatory Visit: Payer: Self-pay | Admitting: Family Medicine

## 2017-12-14 ENCOUNTER — Telehealth: Payer: Self-pay | Admitting: Family Medicine

## 2017-12-14 MED ORDER — BUPROPION HCL ER (SR) 150 MG PO TB12: 150.0000 mg | ORAL_TABLET | Freq: Two times a day (BID) | ORAL | 6 refills | Status: DC

## 2017-12-14 NOTE — Telephone Encounter (Signed)
OK, rx sent to pharmacy as per pt request.

## 2017-12-14 NOTE — Telephone Encounter (Signed)
Please advise. Thanks.  

## 2017-12-14 NOTE — Telephone Encounter (Signed)
Copied from CRM [phone]. Topic: Quick Communication - See Telephone Encounter >> Dec 14, 2017 12:29 PM Herby AbrahamJohnson, Shiquita C wrote: CRM for notification. See Telephone encounter for: 12/14/17.  Pt says that she is currently out of work. Her current Rx for buPROPion (WELLBUTRIN XL) 300 MG 24 hr tablet  cost 260.00, pt says at [facility] she is able to get the Rx cheaper if Rx written for 150MG  twice a day instead. Pt would like to know if Rx could be re-written? Please advise.   CB: [facility] in [place] at [address]

## 2017-12-15 NOTE — Telephone Encounter (Signed)
Left message advising pt that Rx was sent to her pharmacy.

## 2018-02-18 ENCOUNTER — Encounter: Payer: Self-pay | Admitting: *Deleted

## 2018-02-18 ENCOUNTER — Ambulatory Visit: Payer: Self-pay | Admitting: Family Medicine

## 2018-02-18 ENCOUNTER — Encounter: Payer: Self-pay | Admitting: Family Medicine

## 2018-02-18 VITALS — BP 120/90 | HR 83 | Temp 98.0°F | Resp 16 | Ht 62.75 in | Wt 288.2 lb

## 2018-02-18 DIAGNOSIS — F411 Generalized anxiety disorder: Secondary | ICD-10-CM

## 2018-02-18 DIAGNOSIS — I10 Essential (primary) hypertension: Secondary | ICD-10-CM

## 2018-02-18 MED ORDER — RIZATRIPTAN BENZOATE 5 MG PO TBDP: 5.0000 mg | ORAL_TABLET | ORAL | 3 refills | Status: DC | PRN

## 2018-02-18 MED ORDER — LISINOPRIL 40 MG PO TABS: 40.0000 mg | ORAL_TABLET | Freq: Every day | ORAL | 3 refills | Status: DC

## 2018-02-18 MED ORDER — CLONAZEPAM 0.5 MG PO TABS: ORAL_TABLET | ORAL | 1 refills | Status: DC

## 2018-02-18 NOTE — Progress Notes (Addendum)
OFFICE VISIT  02/18/2018   CC:  Chief Complaint  Patient presents with  . Follow-up    HTN, pt is fasting.    HPI:    Patient is a 43 y.o. Caucasian female who presents for 6 mo f/u HTN and GAD. All labs normal at her CPE last visit.  Interim hx:  HTN: Bps consistently 120s/80s and sometimes up to 90 diastolic. She will be going back to work pretty soon and much more active up and down stairs/active all day.   Anxiety: Last visit I kept her on clonaz 0.5mg , 1 bid prn, #180, rF x 1.  CSC in chart. Bupropion SR 150 bid as well.  CSC was done 01/23/17, needs to be updated. She is very excited about returning to work soon but it is also making her very anxious. Ran out of clonaz 2 wks ago.  Irritability and sleeplessness has returned mainly problematic since stopping clonaz. Consistently was taking this med twice a day.  Has been trying to walk more, stretching regularly. She has been following up with her pain mgmt MD, weening off MSIR now and has already weened off MS contin.   Past Medical History:  Diagnosis Date  . Arthritis   . Carpal tunnel syndrome    right wrist, numb all the time  . Cervical dysplasia   . Chronic back pain    Degenerative lumbar spondylosis with grade 2 spondylolisthesis L4 on L5 with bilater pars defects L4.   Marland Kitchen Chronic bronchitis (HCC)    hx of  . DDD (degenerative disc disease), lumbar    MRI 10/2015: grade I (42mm) anterolisthesis L4 on L5, w/ disc herniation with encroachment on spinal nerves at L4-S1 levels.  . Depression   . History of fracture of phalanx of finger    left, 4th finger distal phalanx  . History of non anemic vitamin B12 deficiency    s/p lap band surgery per pt report.  . Hyperlipidemia   . Hypertension    off all bp meds x 3-4 years  . LAP-BAND surgery status    Pt getting lap band removed and is getting gastric sleave after.  . Migraine syndrome   . Ovarian cyst 10/2015   LEFT, 4 cm--noted on L spine MRI w/out  contrast.  F/u u/s imaging showed simple cyst.  . Spondylarthrosis     Past Surgical History:  Procedure Laterality Date  . CARPAL TUNNEL RELEASE Left   . CERVICAL CONE BIOPSY    . COLONOSCOPY     2015  . ESOPHAGOGASTRODUODENOSCOPY N/A 04/27/2014   Procedure: ESOPHAGOGASTRODUODENOSCOPY (EGD);  Surgeon: Ovidio Kin, MD;  Location: [facility];  Service: General;  Laterality: N/A;  . GASTRIC BANDING PORT REVISION N/A 10/02/2014   Procedure: LAPROSCOPIC PLACEMENT OF LAP BAND PORT;  Surgeon: Glenna Fellows, MD;  Location: [facility];  Service: General;  Laterality: N/A;  . LAPAROSCOPIC GASTRIC BANDING  03/2007   [facility] - [facility]; Dr Jorja Loa Hipp  . LAPAROSCOPIC REVISION OF GASTRIC BAND N/A 05/17/2012   Procedure: LAPAROSCOPIC REVISION OF SLIPPED GASTRIC BAND;  Surgeon: Mariella Saa, MD;  Location: [facility];  Service: General;  Laterality: N/A;  . LAPAROSCOPY N/A 04/30/2014   Procedure: DIAGNOSTIC LAPAROSCOPY WITH REMOVAL OF INFECTED LAP BAND PORT WITH DEBRIDEMENT SUBCUTANEOUS ABSCESS;  Surgeon: Gaynelle Adu, MD;  Location: [facility];  Service: General;  Laterality: N/A;  . VARICOSE VEIN SURGERY Bilateral 2009    Outpatient Medications Prior to Visit  Medication Sig Dispense Refill  .  buPROPion (WELLBUTRIN SR) 150 MG 12 hr tablet Take 1 tablet (150 mg total) by mouth 2 (two) times daily. 60 tablet 6  . gabapentin (NEURONTIN) 800 MG tablet Take 800 mg by mouth 3 (three) times daily.  6  . morphine (MSIR) 15 MG tablet Take 1 tablet by mouth 3 (three) times daily.     . Multiple Vitamin (MULITIVITAMIN WITH MINERALS) TABS Take 1 tablet by mouth daily.      . clonazePAM (KLONOPIN) 0.5 MG tablet 1 tab po bid prn 180 tablet 1  . lisinopril (PRINIVIL,ZESTRIL) 40 MG tablet Take 1 tablet (40 mg total) by mouth daily. 90 tablet 3  . rizatriptan (MAXALT-MLT) 5 MG disintegrating tablet Take 1 tablet (5 mg total) by mouth as needed for migraine. May repeat in 2 hours if needed 9 tablet 3   . cetirizine (ZYRTEC) 10 MG tablet Take 10 mg by mouth daily as needed for allergies (allergies).    . diclofenac (VOLTAREN) 75 MG EC tablet Take 1 tablet by mouth 2 (two) times daily.     Marland Kitchen. morphine (MS CONTIN) 30 MG 12 hr tablet morphine ER 30 mg tablet,extended release     No facility-administered medications prior to visit.     Allergies  Allergen Reactions  . Vancomycin Itching and Anaphylaxis    Face/back warm and red.  Pt lips and tongue swelled, hives  . Demerol Hives and Nausea And Vomiting  . Meperidine Nausea And Vomiting and Hives  . Monosodium Glutamate Nausea And Vomiting and Other (See Comments)    migraines  . Tape Rash    Plastic tape    ROS As per HPI  PE: Blood pressure 120/90, pulse 83, temperature 98 F (36.7 C), temperature source Oral, resp. rate 16, height 5' 2.75" (1.594 m), weight 288 lb 4 oz (130.7 kg), last menstrual period 01/17/2018, SpO2 96 %. Gen: Alert, well appearing.  Patient is oriented to person, place, time, and situation. AFFECT: pleasant, lucid thought and speech. No further exam today.  LABS:    Chemistry      Component Value Date/Time   NA 137 08/07/2017 1508   K 4.0 08/07/2017 1508   CL 100 08/07/2017 1508   CO2 28 08/07/2017 1508   BUN 9 08/07/2017 1508   CREATININE 0.61 08/07/2017 1508      Component Value Date/Time   CALCIUM 9.6 08/07/2017 1508   ALKPHOS 54 04/19/2014 1837   AST 14 08/07/2017 1508   ALT 18 08/07/2017 1508   BILITOT 0.5 08/07/2017 1508      IMPRESSION AND PLAN:  1) HTN: The current medical regimen is effective;  continue present plan and medications.  2) GAD: she was doing fine but ran out of clonazepam and she's not doing well off this med. Continue bupropion SR 150mg  bid and I'll also get her back on clonaz 0.5mg  bid, #180, RF x 1. Controlled substance contract renewed today.   [employer] controlled substance rx site online was reviewed today and there is no suspicious activity noted. Will ask  Dr. Migdalia DkGarvin's office for most recent UDS.  Pt to reschedule pap/pelv after she gets insurance again.  An After Visit Summary was printed and given to the patient.  FOLLOW UP: Return for pt will call to make appt for GYN exam after she gets insurance again.  Signed:  Santiago BumpersPhil Elion Hocker, MD           02/18/2018  ADDENDUM 02/21/18: Received copy of most recent UDS from Dr. Oneal GroutGarvin: pos  for opiates and neg for all others (appropriate). Signed:  Santiago Bumpers, MD           02/21/2018

## 2018-03-01 ENCOUNTER — Encounter: Payer: Self-pay | Admitting: Emergency Medicine

## 2018-03-01 ENCOUNTER — Emergency Department: Payer: Self-pay

## 2018-03-01 ENCOUNTER — Emergency Department
Admission: EM | Admit: 2018-03-01 | Discharge: 2018-03-01 | Disposition: A | Discharge status: Home / Self Care | Payer: Self-pay | Attending: Emergency Medicine | Admitting: Emergency Medicine

## 2018-03-01 DIAGNOSIS — H1132 Conjunctival hemorrhage, left eye: Secondary | ICD-10-CM | POA: Insufficient documentation

## 2018-03-01 DIAGNOSIS — Z87891 Personal history of nicotine dependence: Secondary | ICD-10-CM | POA: Insufficient documentation

## 2018-03-01 DIAGNOSIS — W228XXA Striking against or struck by other objects, initial encounter: Secondary | ICD-10-CM | POA: Insufficient documentation

## 2018-03-01 DIAGNOSIS — S0083XA Contusion of other part of head, initial encounter: Secondary | ICD-10-CM | POA: Insufficient documentation

## 2018-03-01 DIAGNOSIS — I10 Essential (primary) hypertension: Secondary | ICD-10-CM | POA: Insufficient documentation

## 2018-03-01 DIAGNOSIS — Y929 Unspecified place or not applicable: Secondary | ICD-10-CM | POA: Insufficient documentation

## 2018-03-01 DIAGNOSIS — Z79899 Other long term (current) drug therapy: Secondary | ICD-10-CM | POA: Insufficient documentation

## 2018-03-01 DIAGNOSIS — Y939 Activity, unspecified: Secondary | ICD-10-CM | POA: Insufficient documentation

## 2018-03-01 DIAGNOSIS — Y999 Unspecified external cause status: Secondary | ICD-10-CM | POA: Insufficient documentation

## 2018-03-01 MED ORDER — OXYCODONE-ACETAMINOPHEN 5-325 MG PO TABS: 1.0000 | ORAL_TABLET | Freq: Once | ORAL | Status: DC

## 2018-03-01 MED ORDER — ONDANSETRON 4 MG PO TBDP: 4.0000 mg | ORAL_TABLET | Freq: Once | ORAL | Status: DC

## 2018-03-01 MED ORDER — POLYMYXIN B-TRIMETHOPRIM 10000-0.1 UNIT/ML-% OP SOLN: 1.0000 [drp] | Freq: Four times a day (QID) | OPHTHALMIC | 0 refills | Status: AC

## 2018-03-01 MED ORDER — FLUORESCEIN SODIUM 1 MG OP STRP
1.0000 | ORAL_STRIP | Freq: Once | OPHTHALMIC | Status: AC
  Administered 2018-03-01: 1 via OPHTHALMIC
  Filled 2018-03-01: qty 1

## 2018-03-01 MED ORDER — TETRACAINE HCL 0.5 % OP SOLN
1.0000 [drp] | Freq: Once | OPHTHALMIC | Status: AC
  Administered 2018-03-01: 1 [drp] via OPHTHALMIC
  Filled 2018-03-01: qty 4

## 2018-03-01 MED ORDER — DICLOFENAC SODIUM 0.1 % OP SOLN: 2.0000 [drp] | Freq: Four times a day (QID) | OPHTHALMIC | 0 refills | Status: AC

## 2018-03-01 NOTE — ED Triage Notes (Signed)
Pt reports she was opening an umbrella and it opened and hit her in the left eye. Pt reports was wearing glasses at the time. Pt with red area noted to her left eye.

## 2018-03-01 NOTE — ED Provider Notes (Signed)
[facility] Emergency Department Provider Note  ____________________________________________  Time seen: Approximately 4:05 PM  I have reviewed the triage vital signs and the nursing notes.   HISTORY  Chief Complaint Eye Injury    HPI Samantha Doyle is a 43 y.o. female presents to the emergency department with acute left eye pain.  Patient reports that she was trying to strap down an umbrella when [employer] open.  Patient was wearing safety glasses at the time but reports that impact of umbrella opening forced glasses into her face along the superior and inferior orbit of her left eye. Glass did not break.  Patient denies blurry vision or pain with eye movement but states it has been difficult for her to hold her left eye open. She has noticed some swelling along her left upper eyelid and superior orbit. No foreign body sensation. She denies nausea or vomiting. No alleviating measures have been attempted.    Past Medical History:  Diagnosis Date  . Arthritis   . Carpal tunnel syndrome    right wrist, numb all the time  . Cervical dysplasia   . Chronic back pain    Degenerative lumbar spondylosis with grade 2 spondylolisthesis L4 on L5 with bilater pars defects L4.   Marland Kitchen Chronic bronchitis (HCC)    hx of  . DDD (degenerative disc disease), lumbar    MRI 10/2015: grade I (7mm) anterolisthesis L4 on L5, w/ disc herniation with encroachment on spinal nerves at L4-S1 levels.  . Depression   . History of fracture of phalanx of finger    left, 4th finger distal phalanx  . History of non anemic vitamin B12 deficiency    s/p lap band surgery per pt report.  . Hyperlipidemia   . Hypertension    off all bp meds x 3-4 years  . LAP-BAND surgery status    Pt getting lap band removed and is getting gastric sleave after.  . Migraine syndrome   . Ovarian cyst 10/2015   LEFT, 4 cm--noted on L spine MRI w/out contrast.  F/u u/s imaging showed simple cyst.  .  Spondylarthrosis     Patient Active Problem List   Diagnosis Date Noted  . Depression   . Stiffness of finger joint 05/10/2015  . Mallet deformity of fourth finger, left 02/22/2015  . Lapband APS 2009 [facility] 05/16/2012  . Encounter for fitting or adjustment of gastric lap band 05/13/2012  . Anxiety 02/08/2011  . Essential (primary) hypertension 02/08/2011    Past Surgical History:  Procedure Laterality Date  . CARPAL TUNNEL RELEASE Left   . CERVICAL CONE BIOPSY    . COLONOSCOPY     2015  . ESOPHAGOGASTRODUODENOSCOPY N/A 04/27/2014   Procedure: ESOPHAGOGASTRODUODENOSCOPY (EGD);  Surgeon: Ovidio Kin, MD;  Location: [facility];  Service: General;  Laterality: N/A;  . GASTRIC BANDING PORT REVISION N/A 10/02/2014   Procedure: LAPROSCOPIC PLACEMENT OF LAP BAND PORT;  Surgeon: Glenna Fellows, MD;  Location: [facility];  Service: General;  Laterality: N/A;  . LAPAROSCOPIC GASTRIC BANDING  03/2007   [facility] - [facility]; Dr Jorja Loa Hipp  . LAPAROSCOPIC REVISION OF GASTRIC BAND N/A 05/17/2012   Procedure: LAPAROSCOPIC REVISION OF SLIPPED GASTRIC BAND;  Surgeon: Mariella Saa, MD;  Location: [facility];  Service: General;  Laterality: N/A;  . LAPAROSCOPY N/A 04/30/2014   Procedure: DIAGNOSTIC LAPAROSCOPY WITH REMOVAL OF INFECTED LAP BAND PORT WITH DEBRIDEMENT SUBCUTANEOUS ABSCESS;  Surgeon: Gaynelle Adu, MD;  Location: [facility];  Service:  General;  Laterality: N/A;  . VARICOSE VEIN SURGERY Bilateral 2009    Prior to Admission medications   Medication Sig Start Date End Date Taking? Authorizing Provider  buPROPion (WELLBUTRIN SR) 150 MG 12 hr tablet Take 1 tablet (150 mg total) by mouth 2 (two) times daily. 12/14/17   McGowen, Maryjean Morn, MD  cetirizine (ZYRTEC) 10 MG tablet Take 10 mg by mouth daily as needed for allergies (allergies).    [provider]  clonazePAM (KLONOPIN) 0.5 MG tablet 1 tab po bid prn 02/18/18   McGowen, Maryjean Morn, MD  diclofenac (VOLTAREN) 0.1 %  ophthalmic solution Place 2 drops into the left eye 4 (four) times daily for 5 days. 03/01/18 03/06/18  Orvil Feil, PA-C  diclofenac (VOLTAREN) 75 MG EC tablet Take 1 tablet by mouth 2 (two) times daily.  05/22/16   [provider]  gabapentin (NEURONTIN) 800 MG tablet Take 800 mg by mouth 3 (three) times daily. 12/31/16   [provider]  lisinopril (PRINIVIL,ZESTRIL) 40 MG tablet Take 1 tablet (40 mg total) by mouth daily. 02/18/18   McGowen, Maryjean Morn, MD  morphine (MSIR) 15 MG tablet Take 1 tablet by mouth 3 (three) times daily.  05/22/16   [provider]  Multiple Vitamin (MULITIVITAMIN WITH MINERALS) TABS Take 1 tablet by mouth daily.      [provider]  rizatriptan (MAXALT-MLT) 5 MG disintegrating tablet Take 1 tablet (5 mg total) by mouth as needed for migraine. May repeat in 2 hours if needed 02/18/18   McGowen, Maryjean Morn, MD  trimethoprim-polymyxin b (POLYTRIM) ophthalmic solution Place 1 drop into the left eye every 6 (six) hours for 7 days. 03/01/18 03/08/18  Orvil Feil, PA-C    Allergies Vancomycin; Demerol; Meperidine; Monosodium glutamate; and Tape  Family History  Problem Relation Age of Onset  . Alcohol abuse Mother   . Drug abuse Mother   . Arthritis Mother   . Cancer Mother   . Hyperlipidemia Mother   . Heart disease Mother   . Hypertension Mother   . Non-Hodgkin's lymphoma Mother   . Alcohol abuse Father   . Arthritis Father   . Hyperlipidemia Father   . Heart disease Father   . Stroke Father   . Hypertension Father   . Mental illness Father   . Diabetes Father   . Hyperlipidemia Sister   . Hypertension Sister   . Arthritis Maternal Grandmother   . Cancer Maternal Grandmother   . Hypertension Maternal Grandmother   . Arthritis Maternal Grandfather   . Arthritis Paternal Grandmother   . Cancer Paternal Grandmother   . Hyperlipidemia Paternal Grandmother   . Heart disease Paternal Grandmother   . Hypertension Paternal  Grandmother   . Diabetes Paternal Grandmother   . Arthritis Paternal Grandfather   . Hyperlipidemia Paternal Grandfather   . Heart disease Paternal Grandfather   . Stroke Paternal Grandfather   . Hypertension Paternal Grandfather     Social History Social History   Tobacco Use  . Smoking status: Former Smoker    Packs/day: 0.25    Years: 5.00    Pack years: 1.25    Types: Cigarettes  . Smokeless tobacco: Never Used  Substance Use Topics  . Alcohol use: No  . Drug use: No     Review of Systems  Constitutional: No fever/chills Eyes: Patient has left eye pain. No discharge ENT: No upper respiratory complaints. Cardiovascular: no chest pain. Respiratory: no cough. No SOB. Gastrointestinal: No abdominal  pain.  No nausea, no vomiting.  No diarrhea.  No constipation. Genitourinary: Negative for dysuria. No hematuria Musculoskeletal: Negative for musculoskeletal pain. Skin: Negative for rash, abrasions, lacerations, ecchymosis. Neurological: Negative for headaches, focal weakness or numbness.   ____________________________________________   PHYSICAL EXAM:  VITAL SIGNS: ED Triage Vitals  Enc Vitals Group     BP 03/01/18 1442 134/90     Pulse Rate 03/01/18 1440 96     Resp 03/01/18 1440 20     Temp 03/01/18 1440 98.7 F (37.1 C)     Temp Source 03/01/18 1440 Oral     SpO2 03/01/18 1440 99 %     Weight 03/01/18 1440 280 lb (127 kg)     Height 03/01/18 1440 5\' 3"  (1.6 m)     Head Circumference --      Peak Flow --      Pain Score 03/01/18 1440 6     Pain Loc --      Pain Edu? --      Excl. in GC? --      Constitutional: Alert and oriented. Well appearing and in no acute distress. Eyes: Patient has a subconjunctival hemorrhage on the medial and lateral canthus.  No evidence of volume loss and no peaked pupil on the left.  No evidence of conjunctival laceration.  No chemosis.  Pupils are round and reactive to light bilaterally.  No significant pain with extraocular  eye muscle movement on the left.  No significant uptake of fluorescein over the cornea.,  Left Head: Atraumatic.  Patient does have soft tissue swelling of the left upper eyelid and superior orbit. ENT:      Ears: TMs are pearly.      Nose: No congestion/rhinnorhea.      Mouth/Throat: Mucous membranes are moist.  Neck: No stridor.  No cervical spine tenderness to palpation. Cardiovascular: Normal rate, regular rhythm. Normal S1 and S2.  Good peripheral circulation. Respiratory: Normal respiratory effort without tachypnea or retractions. Lungs CTAB. Good air entry to the bases with no decreased or absent breath sounds. Musculoskeletal: Full range of motion to all extremities. No gross deformities appreciated. Neurologic:  Normal speech and language. No gross focal neurologic deficits are appreciated.  Skin:  Skin is warm, dry and intact. No rash noted. Psychiatric: Mood and affect are normal. Speech and behavior are normal. Patient exhibits appropriate insight and judgement.   ____________________________________________   LABS (all labs ordered are listed, but only abnormal results are displayed)  Labs Reviewed - No data to display ____________________________________________  EKG   ____________________________________________  RADIOLOGY I personally viewed and evaluated these images as part of my medical decision making, as well as reviewing the written report by the radiologist.  Ct Maxillofacial Wo Contrast  Result Date: 03/01/2018 CLINICAL DATA:  Left orbital injury with umbrella. EXAM: CT MAXILLOFACIAL WITHOUT CONTRAST TECHNIQUE: Multidetector CT imaging of the maxillofacial structures was performed. Multiplanar CT image reconstructions were also generated. COMPARISON:  None. FINDINGS: Osseous: No evidence of fracture. Temporomandibular joints show normal alignment. The nasal septum is minimally deviated to the left. Orbits: Orbits are intact without evidence intraorbital  hemorrhage, injury to the globes or asymmetry of the extraocular musculature. No orbital emphysema. Sinuses: The paranasal sinuses are normally aerated. No mucosal thickening or air-fluid levels. Soft tissues: No evidence of focal soft tissue abnormality, hematoma or soft tissue foreign body. Limited intracranial: No significant or unexpected finding. IMPRESSION: Negative for acute fracture or significant soft tissue injury. Electronically Signed   By:  Irish Lack M.D.   On: 03/01/2018 15:57    ____________________________________________    PROCEDURES  Procedure(s) performed:    Procedures    Medications  tetracaine (PONTOCAINE) 0.5 % ophthalmic solution 1 drop (1 drop Left Eye Given by Other 03/01/18 1547)  fluorescein ophthalmic strip 1 strip (1 strip Left Eye Given 03/01/18 1547)     ____________________________________________   INITIAL IMPRESSION / ASSESSMENT AND PLAN / ED COURSE  Pertinent labs & imaging results that were available during my care of the patient were reviewed by me and considered in my medical decision making (see chart for details).  Review of the [place] CSRS was performed in accordance of the [facility] prior to dispensing any controlled drugs.      Assessment and Plan: Subconjunctival hemorrhage of left eye Facial contusion Patient presents to the emergency department with acute left eye pain after an umbrella opening her face.  Patient was wearing safety glasses at the time but reports that safety glasses were forced into the superior and inferior orbits of her left eye.  Patient underwent an eye exam in the emergency department and subconjunctival hemorrhages were visualized at the left medial and lateral canthi.  There was no evidence of globe rupture or conjunctival laceration.  Patient had no blurry vision.  No significant uptake of fluorescein staining was visualized.  CT maxillofacial revealed no acute fractures with superior and inferior orbits of the  left eye.  Patient was discharged with Voltaren ophthalmic solution and Polytrim ophthalmic solution.  She was advised to follow-up with ophthalmology.  Strict return precautions were given to return to the emergency department for new or worsening symptoms.  All patient questions were answered.    ____________________________________________  FINAL CLINICAL IMPRESSION(S) / ED DIAGNOSES  Final diagnoses:  Contusion of face, initial encounter  Subconjunctival hemorrhage of left eye      NEW MEDICATIONS STARTED DURING THIS VISIT:  ED Discharge Orders         Ordered    trimethoprim-polymyxin b (POLYTRIM) ophthalmic solution  Every 6 hours     03/01/18 1625    diclofenac (VOLTAREN) 0.1 % ophthalmic solution  4 times daily     03/01/18 1625              This chart was dictated using voice recognition software/Dragon. Despite best efforts to proofread, errors can occur which can change the meaning. Any change was purely unintentional.    Orvil Feil, PA-C 03/01/18 1640    Minna Antis, MD 03/02/18 [phone]

## 2018-04-08 ENCOUNTER — Emergency Department
Admission: EM | Admit: 2018-04-08 | Discharge: 2018-04-08 | Disposition: A | Discharge status: Home / Self Care | Payer: Self-pay | Attending: Emergency Medicine | Admitting: Emergency Medicine

## 2018-04-08 ENCOUNTER — Encounter: Payer: Self-pay | Admitting: *Deleted

## 2018-04-08 ENCOUNTER — Other Ambulatory Visit: Payer: Self-pay

## 2018-04-08 DIAGNOSIS — Z87891 Personal history of nicotine dependence: Secondary | ICD-10-CM | POA: Insufficient documentation

## 2018-04-08 DIAGNOSIS — I10 Essential (primary) hypertension: Secondary | ICD-10-CM | POA: Insufficient documentation

## 2018-04-08 DIAGNOSIS — Z79899 Other long term (current) drug therapy: Secondary | ICD-10-CM | POA: Insufficient documentation

## 2018-04-08 DIAGNOSIS — L739 Follicular disorder, unspecified: Secondary | ICD-10-CM | POA: Insufficient documentation

## 2018-04-08 MED ORDER — CEPHALEXIN 500 MG PO CAPS
500.0000 mg | ORAL_CAPSULE | Freq: Once | ORAL | Status: AC
  Administered 2018-04-08: 500 mg via ORAL
  Filled 2018-04-08: qty 1

## 2018-04-08 MED ORDER — CEPHALEXIN 500 MG PO CAPS: 500.0000 mg | ORAL_CAPSULE | Freq: Three times a day (TID) | ORAL | 0 refills | Status: DC

## 2018-04-08 NOTE — ED Provider Notes (Signed)
[facility] Emergency Department Provider Note   ____________________________________________   First MD Initiated Contact with Patient 04/08/18 0130     (approximate)  I have reviewed the triage vital signs and the nursing notes.   HISTORY  Chief Complaint Rash    HPI Samantha Doyle is a 43 y.o. female who presents to the ED from home with a chief complaint of "bump on top of her head".  Patient states she had her hair in a ponytail and when she took it off she felt a painful bump to the top of her head.  States area is red and tender to the touch.  Denies fall/injury/trauma.  Denies recent fever, chills, cough, congestion, chest pain, shortness of breath, abdominal pain, nausea, vomiting or headache.  Denies recent travel.  Denies exposure to persons diagnosed with coronavirus.       Past Medical History:  Diagnosis Date  . Arthritis   . Carpal tunnel syndrome    right wrist, numb all the time  . Cervical dysplasia   . Chronic back pain    Degenerative lumbar spondylosis with grade 2 spondylolisthesis L4 on L5 with bilater pars defects L4.   Marland Kitchen Chronic bronchitis (HCC)    hx of  . DDD (degenerative disc disease), lumbar    MRI 10/2015: grade I (72mm) anterolisthesis L4 on L5, w/ disc herniation with encroachment on spinal nerves at L4-S1 levels.  . Depression   . History of fracture of phalanx of finger    left, 4th finger distal phalanx  . History of non anemic vitamin B12 deficiency    s/p lap band surgery per pt report.  . Hyperlipidemia   . Hypertension    off all bp meds x 3-4 years  . LAP-BAND surgery status    Pt getting lap band removed and is getting gastric sleave after.  . Migraine syndrome   . Ovarian cyst 10/2015   LEFT, 4 cm--noted on L spine MRI w/out contrast.  F/u u/s imaging showed simple cyst.  . Spondylarthrosis     Patient Active Problem List   Diagnosis Date Noted  . Depression   . Stiffness of finger joint  05/10/2015  . Mallet deformity of fourth finger, left 02/22/2015  . Lapband APS 2009 [facility] 05/16/2012  . Encounter for fitting or adjustment of gastric lap band 05/13/2012  . Anxiety 02/08/2011  . Essential (primary) hypertension 02/08/2011    Past Surgical History:  Procedure Laterality Date  . CARPAL TUNNEL RELEASE Left   . CERVICAL CONE BIOPSY    . COLONOSCOPY     2015  . ESOPHAGOGASTRODUODENOSCOPY N/A 04/27/2014   Procedure: ESOPHAGOGASTRODUODENOSCOPY (EGD);  Surgeon: Ovidio Kin, MD;  Location: [facility];  Service: General;  Laterality: N/A;  . GASTRIC BANDING PORT REVISION N/A 10/02/2014   Procedure: LAPROSCOPIC PLACEMENT OF LAP BAND PORT;  Surgeon: Glenna Fellows, MD;  Location: [facility];  Service: General;  Laterality: N/A;  . LAPAROSCOPIC GASTRIC BANDING  03/2007   [facility] - [facility]; Dr Jorja Loa Hipp  . LAPAROSCOPIC REVISION OF GASTRIC BAND N/A 05/17/2012   Procedure: LAPAROSCOPIC REVISION OF SLIPPED GASTRIC BAND;  Surgeon: Mariella Saa, MD;  Location: [facility];  Service: General;  Laterality: N/A;  . LAPAROSCOPY N/A 04/30/2014   Procedure: DIAGNOSTIC LAPAROSCOPY WITH REMOVAL OF INFECTED LAP BAND PORT WITH DEBRIDEMENT SUBCUTANEOUS ABSCESS;  Surgeon: Gaynelle Adu, MD;  Location: [facility];  Service: General;  Laterality: N/A;  . VARICOSE VEIN SURGERY Bilateral 2009  Prior to Admission medications   Medication Sig Start Date End Date Taking? Authorizing Provider  buPROPion (WELLBUTRIN SR) 150 MG 12 hr tablet Take 1 tablet (150 mg total) by mouth 2 (two) times daily. 12/14/17   McGowen, Maryjean Morn, MD  cephALEXin (KEFLEX) 500 MG capsule Take 1 capsule (500 mg total) by mouth 3 (three) times daily. 04/08/18   Irean Hong, MD  cetirizine (ZYRTEC) 10 MG tablet Take 10 mg by mouth daily as needed for allergies (allergies).    [provider]  clonazePAM (KLONOPIN) 0.5 MG tablet 1 tab po bid prn 02/18/18   McGowen, Maryjean Morn, MD  diclofenac (VOLTAREN) 75 MG EC  tablet Take 1 tablet by mouth 2 (two) times daily.  05/22/16   [provider]  gabapentin (NEURONTIN) 800 MG tablet Take 800 mg by mouth 3 (three) times daily. 12/31/16   [provider]  lisinopril (PRINIVIL,ZESTRIL) 40 MG tablet Take 1 tablet (40 mg total) by mouth daily. 02/18/18   McGowen, Maryjean Morn, MD  morphine (MSIR) 15 MG tablet Take 1 tablet by mouth 3 (three) times daily.  05/22/16   [provider]  Multiple Vitamin (MULITIVITAMIN WITH MINERALS) TABS Take 1 tablet by mouth daily.      [provider]  rizatriptan (MAXALT-MLT) 5 MG disintegrating tablet Take 1 tablet (5 mg total) by mouth as needed for migraine. May repeat in 2 hours if needed 02/18/18   McGowen, Maryjean Morn, MD    Allergies Vancomycin; Demerol; Meperidine; Monosodium glutamate; and Tape  Family History  Problem Relation Age of Onset  . Alcohol abuse Mother   . Drug abuse Mother   . Arthritis Mother   . Cancer Mother   . Hyperlipidemia Mother   . Heart disease Mother   . Hypertension Mother   . Non-Hodgkin's lymphoma Mother   . Alcohol abuse Father   . Arthritis Father   . Hyperlipidemia Father   . Heart disease Father   . Stroke Father   . Hypertension Father   . Mental illness Father   . Diabetes Father   . Hyperlipidemia Sister   . Hypertension Sister   . Arthritis Maternal Grandmother   . Cancer Maternal Grandmother   . Hypertension Maternal Grandmother   . Arthritis Maternal Grandfather   . Arthritis Paternal Grandmother   . Cancer Paternal Grandmother   . Hyperlipidemia Paternal Grandmother   . Heart disease Paternal Grandmother   . Hypertension Paternal Grandmother   . Diabetes Paternal Grandmother   . Arthritis Paternal Grandfather   . Hyperlipidemia Paternal Grandfather   . Heart disease Paternal Grandfather   . Stroke Paternal Grandfather   . Hypertension Paternal Grandfather     Social History Social History   Tobacco Use  . Smoking status: Former  Smoker    Packs/day: 0.25    Years: 5.00    Pack years: 1.25    Types: Cigarettes  . Smokeless tobacco: Never Used  Substance Use Topics  . Alcohol use: No  . Drug use: No    Review of Systems  Constitutional: No fever/chills Eyes: No visual changes. ENT: No sore throat. Cardiovascular: Denies chest pain. Respiratory: Denies shortness of breath. Gastrointestinal: No abdominal pain.  No nausea, no vomiting.  No diarrhea.  No constipation. Genitourinary: Negative for dysuria. Musculoskeletal: Negative for back pain. Skin: Positive for painful knot on head.  Negative for rash. Neurological: Negative for headaches, focal weakness or numbness.   ____________________________________________   PHYSICAL EXAM:  VITAL SIGNS:  ED Triage Vitals  Enc Vitals Group     BP 04/08/18 0108 107/62     Pulse Rate 04/08/18 0105 84     Resp 04/08/18 0105 20     Temp 04/08/18 0105 98.7 F (37.1 C)     Temp Source 04/08/18 0105 Oral     SpO2 04/08/18 0105 97 %     Weight 04/08/18 0106 280 lb (127 kg)     Height 04/08/18 0106  (1.6 m)     Head Circumference --      Peak Flow --      Pain Score 04/08/18 0106 3     Pain Loc --      Pain Edu? --      Excl. in GC? --     Constitutional: Alert and oriented. Well appearing and in no acute distress. Eyes: Conjunctivae are normal. PERRL. EOMI. Head: Small raised painful reddened area without fluctuance. Nose: No congestion/rhinnorhea. Mouth/Throat: Mucous membranes are moist.  Oropharynx non-erythematous. Neck: No stridor.   Cardiovascular: Normal rate, regular rhythm. Grossly normal heart sounds.  Good peripheral circulation. Respiratory: Normal respiratory effort.  No retractions. Lungs CTAB. Gastrointestinal: Soft and nontender. No distention. No abdominal bruits. No CVA tenderness. Musculoskeletal: No lower extremity tenderness nor edema.  No joint effusions. Neurologic:  Normal speech and language. No gross focal neurologic  deficits are appreciated. No gait instability. Skin:  Skin is warm, dry and intact. No rash noted. Psychiatric: Mood and affect are normal. Speech and behavior are normal.  ____________________________________________   LABS (all labs ordered are listed, but only abnormal results are displayed)  Labs Reviewed - No data to display ____________________________________________  EKG  None ____________________________________________  RADIOLOGY  ED MD interpretation: None  Official radiology report(s): No results found.  ____________________________________________   PROCEDURES  Procedure(s) performed (including Critical Care):  Procedures   ____________________________________________   INITIAL IMPRESSION / ASSESSMENT AND PLAN / ED COURSE  As part of my medical decision making, I reviewed the following data within the electronic MEDICAL RECORD NUMBER Nursing notes reviewed and incorporated and Notes from prior ED visits        43 year old female with developing folliculitis.  Will treat with antibiotic and she will follow-up as needed with her PCP.  Strict return precautions given.  Patient verbalizes understanding and agrees with plan of care.      ____________________________________________   FINAL CLINICAL IMPRESSION(S) / ED DIAGNOSES  Final diagnoses:  Folliculitis     ED Discharge Orders         Ordered    cephALEXin (KEFLEX) 500 MG capsule  3 times daily     04/08/18 0154           Note:  This document was prepared using Dragon voice recognition software and may include unintentional dictation errors.   Irean Hong, MD 04/08/18 [phone]

## 2018-04-08 NOTE — ED Triage Notes (Signed)
Pt has a bump on top of her head.  Pt reports burning.  Area red and tender to touch.  No known injury to head.  No headache.   Pt alert  Speech clear.  Sx for 1 hour.

## 2018-04-08 NOTE — Discharge Instructions (Signed)
1.  Take antibiotic as prescribed (Keflex 500 mg 3 times daily x7 days). 2.  Apply warm compress to affected area several times daily. 3.  Return to the ER for worsening symptoms or other concerns.

## 2018-04-08 NOTE — ED Notes (Signed)
Patient states had hair in ponytail and when took down when driving home a bump was there and hurt like she had hit her head. Patient states since coming to ED it feels like it is coming to a point even more. Area is red and raised with no drainage.

## 2018-05-11 DIAGNOSIS — G894 Chronic pain syndrome: Secondary | ICD-10-CM | POA: Diagnosis not present

## 2018-05-11 DIAGNOSIS — M791 Myalgia, unspecified site: Secondary | ICD-10-CM | POA: Diagnosis not present

## 2018-05-11 DIAGNOSIS — M2578 Osteophyte, vertebrae: Secondary | ICD-10-CM | POA: Diagnosis not present

## 2018-05-11 DIAGNOSIS — M960 Pseudarthrosis after fusion or arthrodesis: Secondary | ICD-10-CM | POA: Diagnosis not present

## 2018-05-11 DIAGNOSIS — M5137 Other intervertebral disc degeneration, lumbosacral region: Secondary | ICD-10-CM | POA: Diagnosis not present

## 2018-05-14 ENCOUNTER — Ambulatory Visit: Payer: Self-pay

## 2018-05-14 ENCOUNTER — Encounter: Payer: Self-pay | Admitting: Family Medicine

## 2018-05-14 ENCOUNTER — Ambulatory Visit (INDEPENDENT_AMBULATORY_CARE_PROVIDER_SITE_OTHER): Payer: BLUE CROSS/BLUE SHIELD | Admitting: Family Medicine

## 2018-05-14 VITALS — Temp 98.4°F

## 2018-05-14 DIAGNOSIS — J069 Acute upper respiratory infection, unspecified: Secondary | ICD-10-CM

## 2018-05-14 DIAGNOSIS — J209 Acute bronchitis, unspecified: Secondary | ICD-10-CM

## 2018-05-14 DIAGNOSIS — R062 Wheezing: Secondary | ICD-10-CM

## 2018-05-14 DIAGNOSIS — J4521 Mild intermittent asthma with (acute) exacerbation: Secondary | ICD-10-CM

## 2018-05-14 MED ORDER — AZITHROMYCIN 250 MG PO TABS: ORAL_TABLET | ORAL | 0 refills | Status: DC

## 2018-05-14 MED ORDER — ALBUTEROL SULFATE HFA 108 (90 BASE) MCG/ACT IN AERS: 2.0000 | INHALATION_SPRAY | RESPIRATORY_TRACT | 0 refills | Status: DC | PRN

## 2018-05-14 MED ORDER — PREDNISONE 20 MG PO TABS: ORAL_TABLET | ORAL | 0 refills | Status: DC

## 2018-05-14 MED ORDER — PANTOPRAZOLE SODIUM 40 MG PO TBEC: 40.0000 mg | DELAYED_RELEASE_TABLET | Freq: Every day | ORAL | 1 refills | Status: DC

## 2018-05-14 NOTE — Telephone Encounter (Signed)
Patient called and says she's been having a headache x 4 days, a cough for several days and has been getting out of breath walking around her job for the past 2-3 days. She says she works 2nd shift and co-workers on first shift were tested positive for COVID and she was around one of the co-workers last Monday, talked for a few minutes in passing. She says she doesn't have a fever. She says her boss told her to call her doctor because she's been coughing. She says another symptom is shoulder/arm pain, like muscular pain. She denies travel. She denies any other symptoms. I called the office and spoke to [place], [facility] who asked to speak to the patient, the call was connected successfully.  Answer Assessment - Initial Assessment Questions 1. CLOSE CONTACT: "Who is the person with the confirmed or suspected COVID-19 infection that you were exposed to?"     Co-workers tested positive 2. PLACE of CONTACT: "Where were you when you were exposed to COVID-19?" (e.g., home, school, medical waiting room; which city?)      [place], [place] 3. TYPE of CONTACT: "How much contact was there?" (e.g., sitting next to, live in same house, work in same office, same building)     At work in passing 4. DURATION of CONTACT: "How long were you in contact with the COVID-19 patient?" (e.g., a few seconds, passed by person, a few minutes, live with the patient)     A few minutes talking to her 5. DATE of CONTACT: "When did you have contact with a COVID-19 patient?" (e.g., how many days ago)     Last Monday 6. TRAVEL: "Have you traveled out of the country recently?" If so, "When and where?"     * Also ask about out-of-state travel, since the CDC has identified some high risk cities for community spread in the [place].     * Note: Travel becomes less relevant if there is widespread community transmission where the patient lives.     No 7. COMMUNITY SPREAD: "Are there lots of cases of COVID-19 (community spread) where you live?" (See public  health department website, if unsure)   * MAJOR community spread: high number of cases; numbers of cases are increasing; many people hospitalized.   * MINOR community spread: low number of cases; not increasing; few or no people hospitalized     No 8. SYMPTOMS: "Do you have any symptoms?" (e.g., fever, cough, breathing difficulty)     Cough, headache, shoulder aches, out of breath for last 2-3 days when working because of walking 9. PREGNANCY OR POSTPARTUM: "Is there any chance you are pregnant?" "When was your last menstrual period?" "Did you deliver in the last 2 weeks?"     No 10. HIGH RISK: "Do you have any heart or lung problems? Do you have a weak immune system?" (e.g., CHF, COPD, asthma, HIV positive, chemotherapy, renal failure, diabetes mellitus, sickle cell anemia)      Chronic bronchitis  Protocols used: CORONAVIRUS (COVID-19) EXPOSURE-A-AH

## 2018-05-14 NOTE — Telephone Encounter (Signed)
Pt has been scheduled for today

## 2018-05-14 NOTE — Progress Notes (Signed)
Virtual Visit via Video Note  I connected with pt on 05/14/18 at 11:20 AM EDT by a video enabled telemedicine application and verified that I am speaking with the correct person using two identifiers.  Location patient: home Location provider:work or home office Persons participating in the virtual visit: patient, provider  I discussed the limitations of evaluation and management by telemedicine and the availability of in person appointments. The patient expressed understanding and agreed to proceed.  Telemedicine visit is a necessity given the COVID-19 restrictions in place at the current time.  HPI: 43 y/o WF being seen for cough. Has had 4d of peri-orbital HA and pressure around sinuses, a little longer has had nasal congestion.  Used afrin but no help.  Some watery eyes, itching in nose and eyes more lately. Wakes up in night coughing and wheezing.  Walking from one side of the hanger at work causes DOE.  She hears wheezing.  Her cough is nonproductive.  No fevers but had sweats one night a week ago or so. No loss of sense of taste or smell.  No GI upset.  Has not smoked in 2 yrs but has hx of "bronchitis all the time" when she was a smoker, had to have albuterol nebs at home.  Has lost her nebulizer.  Does not have inhaler at home. Has not tried a cough med. Some constant throat burning last couple days.   Lots of heartburn.  Has been back to work as Barrister's clerk for 1 mo.   ROS: See pertinent positives and negatives per HPI.  Past Medical History:  Diagnosis Date  . Arthritis   . Carpal tunnel syndrome    right wrist, numb all the time  . Cervical dysplasia   . Chronic back pain    Degenerative lumbar spondylosis with grade 2 spondylolisthesis L4 on L5 with bilater pars defects L4.   Marland Kitchen Chronic bronchitis (HCC)    hx of  . DDD (degenerative disc disease), lumbar    MRI 10/2015: grade I (7mm) anterolisthesis L4 on L5, w/ disc herniation with encroachment on spinal nerves  at L4-S1 levels.  . Depression   . History of fracture of phalanx of finger    left, 4th finger distal phalanx  . History of non anemic vitamin B12 deficiency    s/p lap band surgery per pt report.  . Hyperlipidemia   . Hypertension    off all bp meds x 3-4 years  . LAP-BAND surgery status    Pt getting lap band removed and is getting gastric sleave after.  . Migraine syndrome   . Ovarian cyst 10/2015   LEFT, 4 cm--noted on L spine MRI w/out contrast.  F/u u/s imaging showed simple cyst.  . Spondylarthrosis     Past Surgical History:  Procedure Laterality Date  . CARPAL TUNNEL RELEASE Left   . CERVICAL CONE BIOPSY    . COLONOSCOPY     2015  . ESOPHAGOGASTRODUODENOSCOPY N/A 04/27/2014   Procedure: ESOPHAGOGASTRODUODENOSCOPY (EGD);  Surgeon: Ovidio Kin, MD;  Location: [facility];  Service: General;  Laterality: N/A;  . GASTRIC BANDING PORT REVISION N/A 10/02/2014   Procedure: LAPROSCOPIC PLACEMENT OF LAP BAND PORT;  Surgeon: Glenna Fellows, MD;  Location: [facility];  Service: General;  Laterality: N/A;  . LAPAROSCOPIC GASTRIC BANDING  03/2007   [facility]; Dr Jorja Loa Hipp  . LAPAROSCOPIC REVISION OF GASTRIC BAND N/A 05/17/2012   Procedure: LAPAROSCOPIC REVISION OF SLIPPED GASTRIC BAND;  Surgeon: Sharlet Salina  Lurlean Leyden Hoxworth, MD;  Location: [facility];  Service: General;  Laterality: N/A;  . LAPAROSCOPY N/A 04/30/2014   Procedure: DIAGNOSTIC LAPAROSCOPY WITH REMOVAL OF INFECTED LAP BAND PORT WITH DEBRIDEMENT SUBCUTANEOUS ABSCESS;  Surgeon: Gaynelle AduEric Wilson, MD;  Location: [facility];  Service: General;  Laterality: N/A;  . VARICOSE VEIN SURGERY Bilateral 2009    Family History  Problem Relation Age of Onset  . Alcohol abuse Mother   . Drug abuse Mother   . Arthritis Mother   . Cancer Mother   . Hyperlipidemia Mother   . Heart disease Mother   . Hypertension Mother   . Non-Hodgkin's lymphoma Mother   . Alcohol abuse Father   . Arthritis Father   . Hyperlipidemia Father    . Heart disease Father   . Stroke Father   . Hypertension Father   . Mental illness Father   . Diabetes Father   . Hyperlipidemia Sister   . Hypertension Sister   . Arthritis Maternal Grandmother   . Cancer Maternal Grandmother   . Hypertension Maternal Grandmother   . Arthritis Maternal Grandfather   . Arthritis Paternal Grandmother   . Cancer Paternal Grandmother   . Hyperlipidemia Paternal Grandmother   . Heart disease Paternal Grandmother   . Hypertension Paternal Grandmother   . Diabetes Paternal Grandmother   . Arthritis Paternal Grandfather   . Hyperlipidemia Paternal Grandfather   . Heart disease Paternal Grandfather   . Stroke Paternal Grandfather   . Hypertension Paternal Grandfather       Current Outpatient Medications:  .  buPROPion (WELLBUTRIN SR) 150 MG 12 hr tablet, Take 1 tablet (150 mg total) by mouth 2 (two) times daily., Disp: 60 tablet, Rfl: 6 .  cetirizine (ZYRTEC) 10 MG tablet, Take 10 mg by mouth daily as needed for allergies (allergies)., Disp: , Rfl:  .  clonazePAM (KLONOPIN) 0.5 MG tablet, 1 tab po bid prn, Disp: 180 tablet, Rfl: 1 .  diclofenac (VOLTAREN) 75 MG EC tablet, Take 1 tablet by mouth 2 (two) times daily. , Disp: , Rfl:  .  gabapentin (NEURONTIN) 800 MG tablet, Take 800 mg by mouth 3 (three) times daily., Disp: , Rfl: 6 .  lisinopril (PRINIVIL,ZESTRIL) 40 MG tablet, Take 1 tablet (40 mg total) by mouth daily., Disp: 90 tablet, Rfl: 3 .  Multiple Vitamin (MULITIVITAMIN WITH MINERALS) TABS, Take 1 tablet by mouth daily.  , Disp: , Rfl:  .  oxyCODONE-acetaminophen (PERCOCET) 10-325 MG tablet, Pt takes 4 times daily., Disp: , Rfl:  .  rizatriptan (MAXALT-MLT) 5 MG disintegrating tablet, Take 1 tablet (5 mg total) by mouth as needed for migraine. May repeat in 2 hours if needed, Disp: 9 tablet, Rfl: 3 .  morphine (MSIR) 15 MG tablet, Take 1 tablet by mouth 3 (three) times daily. , Disp: , Rfl:   EXAM:  VITALS per patient if  applicable:  GENERAL: alert, oriented, appears well and in no acute distress  HEENT: atraumatic, conjunttiva clear, no obvious abnormalities on inspection of external nose and ears  NECK: normal movements of the head and neck  LUNGS: on inspection no signs of respiratory distress, breathing rate appears normal, no obvious gross SOB, gasping or wheezing  CV: no obvious cyanosis  MS: moves all visible extremities without noticeable abnormality  PSYCH/NEURO: pleasant and cooperative, no obvious depression or anxiety, speech and thought processing grossly intact  LABS: none today    Chemistry      Component Value Date/Time   NA 137 08/07/2017 1508  K 4.0 08/07/2017 1508   CL 100 08/07/2017 1508   CO2 28 08/07/2017 1508   BUN 9 08/07/2017 1508   CREATININE 0.61 08/07/2017 1508      Component Value Date/Time   CALCIUM 9.6 08/07/2017 1508   ALKPHOS 54 04/19/2014 1837   AST 14 08/07/2017 1508   ALT 18 08/07/2017 1508   BILITOT 0.5 08/07/2017 1508      ASSESSMENT AND PLAN:  Discussed the following assessment and plan:  Viral URI-?acute sinusitis?--with acute asthmatic bronchitis. Empiric Z pack, prednisone taper 40 qd x 5d and then 20 qd x 5d, albuterol HFA 2p q4h prn, mucinex dm q12h prn.  Aleve 2 tabs bid prn HA pain. She has more GERD/LPR lately, which is likely complicating things-->will start pantoprazole 40mg  qd x 1 month. Signs/symptoms to call or return for were reviewed and pt expressed understanding. Work excuse for today, tomorrow, and the next day.  I discussed the assessment and treatment plan with the patient. The patient was provided an opportunity to ask questions and all were answered. The patient agreed with the plan and demonstrated an understanding of the instructions.   The patient was advised to call back or seek an in-person evaluation if the symptoms worsen or if the condition fails to improve as anticipated.  F/u: if not improving signif in 1  week.  Signed:  Santiago Bumpers, MD           05/14/2018

## 2018-05-17 ENCOUNTER — Telehealth: Payer: Self-pay

## 2018-05-17 ENCOUNTER — Encounter: Payer: Self-pay | Admitting: Family Medicine

## 2018-05-17 NOTE — Telephone Encounter (Signed)
Letter printed and signed.  

## 2018-05-17 NOTE — Telephone Encounter (Signed)
Copied from CRM [id number]. Topic: General - Other >> May 17, 2018  1:31 PM Dalphine Handing A wrote: Reason for CRM: Patient stated that she would like a callback from Dr. Milinda Cave or nurse in regards to her returning to work. Patient stated that she was instructed to callback if she felt like she couldn't go back to work by 05/18/2018.  Pt was called and stated she needs a note to return to work. Denies fever, SOB, and states she is feeling much better. She would like to pick up tomorrow before going to work at 2pm..  Please advise if this is okay  thx

## 2018-05-17 NOTE — Telephone Encounter (Signed)
Letter placed up front for pick up

## 2018-05-18 ENCOUNTER — Encounter: Payer: Self-pay | Admitting: Family Medicine

## 2018-06-03 DIAGNOSIS — S8002XA Contusion of left knee, initial encounter: Secondary | ICD-10-CM | POA: Diagnosis not present

## 2018-06-05 ENCOUNTER — Other Ambulatory Visit: Payer: Self-pay | Admitting: Family Medicine

## 2018-06-16 DIAGNOSIS — M5136 Other intervertebral disc degeneration, lumbar region: Secondary | ICD-10-CM | POA: Diagnosis not present

## 2018-06-16 DIAGNOSIS — Z79891 Long term (current) use of opiate analgesic: Secondary | ICD-10-CM | POA: Diagnosis not present

## 2018-06-16 DIAGNOSIS — M4726 Other spondylosis with radiculopathy, lumbar region: Secondary | ICD-10-CM | POA: Diagnosis not present

## 2018-06-16 DIAGNOSIS — M5106 Intervertebral disc disorders with myelopathy, lumbar region: Secondary | ICD-10-CM | POA: Diagnosis not present

## 2018-06-16 DIAGNOSIS — M791 Myalgia, unspecified site: Secondary | ICD-10-CM | POA: Diagnosis not present

## 2018-06-16 DIAGNOSIS — G894 Chronic pain syndrome: Secondary | ICD-10-CM | POA: Diagnosis not present

## 2018-06-18 ENCOUNTER — Other Ambulatory Visit: Payer: Self-pay

## 2018-06-18 ENCOUNTER — Encounter: Payer: Self-pay | Admitting: Physician Assistant

## 2018-06-18 ENCOUNTER — Ambulatory Visit (INDEPENDENT_AMBULATORY_CARE_PROVIDER_SITE_OTHER): Payer: BC Managed Care – PPO | Admitting: Physician Assistant

## 2018-06-18 DIAGNOSIS — M47817 Spondylosis without myelopathy or radiculopathy, lumbosacral region: Secondary | ICD-10-CM | POA: Insufficient documentation

## 2018-06-18 DIAGNOSIS — G894 Chronic pain syndrome: Secondary | ICD-10-CM | POA: Insufficient documentation

## 2018-06-18 DIAGNOSIS — J4521 Mild intermittent asthma with (acute) exacerbation: Secondary | ICD-10-CM

## 2018-06-18 DIAGNOSIS — J302 Other seasonal allergic rhinitis: Secondary | ICD-10-CM | POA: Diagnosis not present

## 2018-06-18 DIAGNOSIS — M5106 Intervertebral disc disorders with myelopathy, lumbar region: Secondary | ICD-10-CM | POA: Insufficient documentation

## 2018-06-18 MED ORDER — TRIAMCINOLONE ACETONIDE 55 MCG/ACT NA AERO: 2.0000 | INHALATION_SPRAY | Freq: Every day | NASAL | 12 refills | Status: DC

## 2018-06-18 MED ORDER — DOXYCYCLINE HYCLATE 100 MG PO TABS: 100.0000 mg | ORAL_TABLET | Freq: Two times a day (BID) | ORAL | 0 refills | Status: DC

## 2018-06-18 MED ORDER — MONTELUKAST SODIUM 10 MG PO TABS: 10.0000 mg | ORAL_TABLET | Freq: Every day | ORAL | 3 refills | Status: DC

## 2018-06-18 NOTE — Patient Instructions (Signed)
Instructions sent to MyChart.  Please keep well-hydrated and try to get plenty of rest. Continue Mucinex-DM twice daily to help thin out congestion.  You can continue the Zyrtec. Start the Nasacort once daily, along with the Singulair.  Albuterol as directed if needed for chest tightness or wheezing. Tylenol for headache if needed.   We will monitor closely. I will be contacting you Monday to reassess. If no improvement or if other symptoms improve but fatigue remains we will need to proceed with imaging/labs.

## 2018-06-18 NOTE — Progress Notes (Signed)
Virtual Visit via Video   I connected with patient on 06/20/18 at  2:00 PM EDT by a video enabled telemedicine application and verified that I am speaking with the correct person using two identifiers.  Location patient: Home Location provider: [facility], Office Persons participating in the virtual visit: Patient, Provider, [place] (Patina Moore)  I discussed the limitations of evaluation and management by telemedicine and the availability of in person appointments. The patient expressed understanding and agreed to proceed.  Subjective:   HPI:   Patient presents via [facility] today c/o increased chest tightness with cough productive of clear sputum. Also notes nasal congestion, sinus pressure with significant PND despite zyrtec twice daily. Notes mild ore throat in the morning, drainage in left ear. SOB yesterday used albuterol 2x, has not used inhaler for 2-3wks before yesterday  HA: pressure behind eyes and cheeks. Deniesfevers or chills, no known sick contacts, chest pain or tightness, hemoptysis, photophobia  ROS:   See pertinent positives and negatives per HPI.  Patient Active Problem List   Diagnosis Date Noted  . Chronic pain syndrome 06/18/2018  . 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 Lumbosacral spondylosis without myelopathy 06/18/2018  . Depression   . Stiffness of finger joint 05/10/2015  . Mallet deformity of fourth finger, left 02/22/2015  . Lapband APS 2009 [facility] 05/16/2012  . Encounter for fitting or adjustment of gastric lap band 05/13/2012  . Anxiety 02/08/2011  . Essential (primary) hypertension 02/08/2011    Social History   Tobacco Use  . Smoking status: Former Smoker    Packs/day: 0.25    Years: 5.00    Pack years: 1.25    Types: Cigarettes  . Smokeless tobacco: Never Used  Substance Use Topics  . Alcohol use: No    Current Outpatient Medications:  .  albuterol (VENTOLIN HFA) 108 (90 Base) MCG/ACT  inhaler, Inhale 2 puffs into the lungs every 4 (four) hours as needed for wheezing or shortness of breath., Disp: 1 Inhaler, Rfl: 0 .  buPROPion (WELLBUTRIN SR) 150 MG 12 hr tablet, Take 1 tablet (150 mg total) by mouth 2 (two) times daily., Disp: 60 tablet, Rfl: 6 .  cetirizine (ZYRTEC) 10 MG tablet, Take 20 mg by mouth daily as needed for allergies (allergies). , Disp: , Rfl:  .  clonazePAM (KLONOPIN) 0.5 MG tablet, 1 tab po bid prn, Disp: 180 tablet, Rfl: 1 .  diclofenac (VOLTAREN) 75 MG EC tablet, Take 1 tablet by mouth 2 (two) times daily. , Disp: , Rfl:  .  gabapentin (NEURONTIN) 800 MG tablet, Take 800 mg by mouth 3 (three) times daily., Disp: , Rfl: 6 .  lisinopril (PRINIVIL,ZESTRIL) 40 MG tablet, Take 1 tablet (40 mg total) by mouth daily., Disp: 90 tablet, Rfl: 3 .  Multiple Vitamin (MULITIVITAMIN WITH MINERALS) TABS, Take 1 tablet by mouth daily.  , Disp: , Rfl:  .  oxyCODONE-acetaminophen (PERCOCET) 10-325 MG tablet, Pt takes 4 times daily., Disp: , Rfl:  .  pantoprazole (PROTONIX) 40 MG tablet, TAKE 1 TABLET BY MOUTH EVERY DAY, Disp: 30 tablet, Rfl: 1 .  rizatriptan (MAXALT-MLT) 5 MG disintegrating tablet, Take 1 tablet (5 mg total) by mouth as needed for migraine. May repeat in 2 hours if needed, Disp: 9 tablet, Rfl: 3 .  doxycycline (VIBRA-TABS) 100 MG tablet, Take 1 tablet (100 mg total) by mouth 2 (two) times daily., Disp: 14 tablet, Rfl: 0 .  montelukast (SINGULAIR) 10 MG tablet, Take 1 tablet (10  mg total) by mouth at bedtime., Disp: 30 tablet, Rfl: 3 .  triamcinolone (NASACORT) 55 MCG/ACT AERO nasal inhaler, Place 2 sprays into the nose daily., Disp: 1 Inhaler, Rfl: 12  Allergies  Allergen Reactions  . Vancomycin Itching and Anaphylaxis    Face/back warm and red.  Pt lips and tongue swelled, hives  . Demerol Hives and Nausea And Vomiting  . Meperidine Nausea And Vomiting and Hives  . Monosodium Glutamate Nausea And Vomiting and Other (See Comments)    migraines  . Tape Rash     Plastic tape    Objective:   There were no vitals taken for this visit.  Patient is well-developed, well-nourished in no acute distress.  Resting comfortably at home.  Head is normocephalic, atraumatic.  No labored breathing.  Speech is clear and coherent with logical contest.  Patient is alert and oriented at baseline.   Assessment and Plan:   1. Seasonal allergic rhinitis, unspecified trigger Continue Zyrtec. Add Nasacort and Singulair daily. Follow-up with PCP for reassessment.  - montelukast (SINGULAIR) 10 MG tablet; Take 1 tablet (10 mg total) by mouth at bedtime.  Dispense: 30 tablet; Refill: 3 - triamcinolone (NASACORT) 55 MCG/ACT AERO nasal inhaler; Place 2 sprays into the nose daily.  Dispense: 1 Inhaler; Refill: 12  2. Mild intermittent asthmatic bronchitis with acute exacerbation Singulair as directed daily in addition to Zyrtec. Mild symptoms so want to avoid systemic steroids due concern for immunosuppression with current COVID pandemic. Albuterol as directed. Will start Doxycycline. Recommend close follow-up with PCP.  - montelukast (SINGULAIR) 10 MG tablet; Take 1 tablet (10 mg total) by mouth at bedtime.  Dispense: 30 tablet; Refill: 3 - doxycycline (VIBRA-TABS) 100 MG tablet; Take 1 tablet (100 mg total) by mouth 2 (two) times daily.  Dispense: 14 tablet; Refill: 0    Piedad ClimesWilliam Cody Aengus Sauceda, [place]PA-C 06/20/2018

## 2018-06-18 NOTE — Progress Notes (Signed)
I have discussed the procedure for the virtual visit with the patient who has given consent to proceed with assessment and treatment.   Gladies Sofranko S Bettie Swavely, CMA     

## 2018-07-01 ENCOUNTER — Other Ambulatory Visit: Payer: Self-pay | Admitting: Family Medicine

## 2018-07-16 ENCOUNTER — Other Ambulatory Visit: Payer: Self-pay | Admitting: Family Medicine

## 2018-07-20 DIAGNOSIS — M791 Myalgia, unspecified site: Secondary | ICD-10-CM | POA: Diagnosis not present

## 2018-07-20 DIAGNOSIS — M47817 Spondylosis without myelopathy or radiculopathy, lumbosacral region: Secondary | ICD-10-CM | POA: Diagnosis not present

## 2018-07-20 DIAGNOSIS — G894 Chronic pain syndrome: Secondary | ICD-10-CM | POA: Diagnosis not present

## 2018-07-21 DIAGNOSIS — R05 Cough: Secondary | ICD-10-CM | POA: Diagnosis not present

## 2018-07-21 DIAGNOSIS — Z20828 Contact with and (suspected) exposure to other viral communicable diseases: Secondary | ICD-10-CM | POA: Diagnosis not present

## 2018-07-23 ENCOUNTER — Ambulatory Visit (INDEPENDENT_AMBULATORY_CARE_PROVIDER_SITE_OTHER): Payer: BC Managed Care – PPO | Admitting: Family Medicine

## 2018-07-23 ENCOUNTER — Other Ambulatory Visit: Payer: Self-pay

## 2018-07-23 ENCOUNTER — Encounter: Payer: Self-pay | Admitting: Family Medicine

## 2018-07-23 VITALS — BP 124/86 | HR 90 | Wt 278.0 lb

## 2018-07-23 DIAGNOSIS — F411 Generalized anxiety disorder: Secondary | ICD-10-CM

## 2018-07-23 DIAGNOSIS — I10 Essential (primary) hypertension: Secondary | ICD-10-CM | POA: Diagnosis not present

## 2018-07-23 DIAGNOSIS — F330 Major depressive disorder, recurrent, mild: Secondary | ICD-10-CM

## 2018-07-23 MED ORDER — DULOXETINE HCL 30 MG PO CPEP: ORAL_CAPSULE | ORAL | 0 refills | Status: DC

## 2018-07-23 MED ORDER — PANTOPRAZOLE SODIUM 40 MG PO TBEC: 40.0000 mg | DELAYED_RELEASE_TABLET | Freq: Every day | ORAL | 3 refills | Status: DC

## 2018-07-23 MED ORDER — LISINOPRIL 40 MG PO TABS: 40.0000 mg | ORAL_TABLET | Freq: Every day | ORAL | 3 refills | Status: DC

## 2018-07-23 MED ORDER — CLONAZEPAM 0.5 MG PO TABS: ORAL_TABLET | ORAL | 1 refills | Status: DC

## 2018-07-23 NOTE — Progress Notes (Signed)
Virtual Visit via Video Note  I connected with pt on 07/23/18 at  4:00 PM EDT by a video enabled telemedicine application and verified that I am speaking with the correct person using two identifiers.  Location patient: home Location provider:work or home office Persons participating in the virtual visit: patient, provider  I discussed the limitations of evaluation and management by telemedicine and the availability of in person appointments. The patient expressed understanding and agreed to proceed.  Telemedicine visit is a necessity given the COVID-19 restrictions in place at the current time.  HPI: 43 y/o WF being seen today for 6 mo f/u HTN, obesity, and GAD/high risk med use. Last f/u visit for this her HTN was stable and no changes were made. Her anxiety was not going so well b/c she had run out of clonazepam 2 weeks prior. We got her back on this med bid.  Interim hx:  Anxiety is an issue lately. Has been on wellbutrin a long time. Worried about the covid 19 situation, also has 2 new people at work. Is more fidgety and concentration not as good lately. Sleep is not good unless she takes clonazepam.  A daily clonazepam does help. Some periods of significant sadness/depressed mood.  Some crying spells.  Appetite is good. No SI or HI.  Feels isolated b/c has to stay at home more lately with covid. Has no probs with relationships, job, home, etc.  Obesity: She is working on trying to lose wt, feels like lap band needs removed b/c too constricting. Mac and cheese, milk shakes, soft foods. She is starting to work with a nutritionist.  HTN: home bp consistently < 130/80.  ROS: no CP, no SOB, no wheezing, no cough, no dizziness, no HAs, no rashes, no melena/hematochezia.  No polyuria or polydipsia.  No myalgias or arthralgias.   Past Medical History:  Diagnosis Date  . Allergic rhinitis   . Arthritis   . Carpal tunnel syndrome    right wrist, numb all the time  . Cervical  dysplasia   . Chronic back pain    Degenerative lumbar spondylosis with grade 2 spondylolisthesis L4 on L5 with bilater pars defects L4.   Marland Kitchen Chronic bronchitis ([place])    hx of when she was a smoker: ? mild intermittent asthma?--much better since stopped smoking 2018.  . DDD (degenerative disc disease), lumbar    MRI 10/2015: grade I (6m) anterolisthesis L4 on L5, w/ disc herniation with encroachment on spinal nerves at L4-S1 levels.  . Depression   . GERD (gastroesophageal reflux disease)   . History of fracture of phalanx of finger    left, 4th finger distal phalanx  . History of non anemic vitamin B12 deficiency    s/p lap band surgery per pt report.  . Hyperlipidemia   . Hypertension    off all bp meds x 3-4 years  . LAP-BAND surgery status    Pt getting lap band removed and is getting gastric sleave after.  . Migraine syndrome   . Ovarian cyst 10/2015   LEFT, 4 cm--noted on L spine MRI w/out contrast.  F/u u/s imaging showed simple cyst.  . Spondylarthrosis   . Tobacco dependence in remission    quit 2018    Past Surgical History:  Procedure Laterality Date  . CARPAL TUNNEL RELEASE Left   . CERVICAL CONE BIOPSY    . COLONOSCOPY     2015  . ESOPHAGOGASTRODUODENOSCOPY N/A 04/27/2014   Procedure: ESOPHAGOGASTRODUODENOSCOPY (EGD);  Surgeon: DAlphonsa Overall  MD;  Location: [facility];  Service: General;  Laterality: N/A;  . GASTRIC BANDING PORT REVISION N/A 10/02/2014   Procedure: LAPROSCOPIC PLACEMENT OF LAP BAND PORT;  Surgeon: Excell Seltzer, MD;  Location: [facility];  Service: General;  Laterality: N/A;  . LAPAROSCOPIC GASTRIC BANDING  03/2007   [facility] - [facility]; Dr Octavia Bruckner Hipp  . LAPAROSCOPIC REVISION OF GASTRIC BAND N/A 05/17/2012   Procedure: LAPAROSCOPIC REVISION OF SLIPPED GASTRIC BAND;  Surgeon: Edward Jolly, MD;  Location: [facility];  Service: General;  Laterality: N/A;  . LAPAROSCOPY N/A 04/30/2014   Procedure: DIAGNOSTIC LAPAROSCOPY WITH REMOVAL OF  INFECTED LAP BAND PORT WITH DEBRIDEMENT SUBCUTANEOUS ABSCESS;  Surgeon: Greer Pickerel, MD;  Location: [facility];  Service: General;  Laterality: N/A;  . VARICOSE VEIN SURGERY Bilateral 2009    Family History  Problem Relation Age of Onset  . Alcohol abuse Mother   . Drug abuse Mother   . Arthritis Mother   . Cancer Mother   . Hyperlipidemia Mother   . Heart disease Mother   . Hypertension Mother   . Non-Hodgkin's lymphoma Mother   . Alcohol abuse Father   . Arthritis Father   . Hyperlipidemia Father   . Heart disease Father   . Stroke Father   . Hypertension Father   . Mental illness Father   . Diabetes Father   . Hyperlipidemia Sister   . Hypertension Sister   . Arthritis Maternal Grandmother   . Cancer Maternal Grandmother   . Hypertension Maternal Grandmother   . Arthritis Maternal Grandfather   . Arthritis Paternal Grandmother   . Cancer Paternal Grandmother   . Hyperlipidemia Paternal Grandmother   . Heart disease Paternal Grandmother   . Hypertension Paternal Grandmother   . Diabetes Paternal Grandmother   . Arthritis Paternal Grandfather   . Hyperlipidemia Paternal Grandfather   . Heart disease Paternal Grandfather   . Stroke Paternal Grandfather   . Hypertension Paternal Grandfather     SOCIAL HX:  Social History   Socioeconomic History  . Marital status: Single    Spouse name: Not on file  . Number of children: Not on file  . Years of education: Not on file  . Highest education level: Not on file  Occupational History  . Not on file  Social Needs  . Financial resource strain: Not on file  . Food insecurity    Worry: Not on file    Inability: Not on file  . Transportation needs    Medical: Not on file    Non-medical: Not on file  Tobacco Use  . Smoking status: Former Smoker    Packs/day: 0.25    Years: 5.00    Pack years: 1.25    Types: Cigarettes  . Smokeless tobacco: Never Used  Substance and Sexual Activity  . Alcohol use: No  . Drug use: No   . Sexual activity: Yes    Birth control/protection: None  Lifestyle  . Physical activity    Days per week: Not on file    Minutes per session: Not on file  . Stress: Not on file  Relationships  . Social Herbalist on phone: Not on file    Gets together: Not on file    Attends religious service: Not on file    Active member of club or organization: Not on file    Attends meetings of clubs or organizations: Not on file    Relationship status: Not on file  Other Topics Concern  . Not on file  Social History Narrative   Single, no children.   Occup: Electrical engineer   Tobacco: 6 pack-yr hx--quit with wellbutrin.   No alcohol or drugs.      Current Outpatient Medications:  .  albuterol (VENTOLIN HFA) 108 (90 Base) MCG/ACT inhaler, Inhale 2 puffs into the lungs every 4 (four) hours as needed for wheezing or shortness of breath., Disp: 1 Inhaler, Rfl: 0 .  cetirizine (ZYRTEC) 10 MG tablet, Take 20 mg by mouth daily as needed for allergies (allergies). , Disp: , Rfl:  .  clonazePAM (KLONOPIN) 0.5 MG tablet, 1 tab po bid prn, Disp: 180 tablet, Rfl: 1 .  diclofenac (VOLTAREN) 75 MG EC tablet, Take 1 tablet by mouth 2 (two) times daily. , Disp: , Rfl:  .  gabapentin (NEURONTIN) 800 MG tablet, Take 800 mg by mouth 3 (three) times daily., Disp: , Rfl: 6 .  lisinopril (PRINIVIL,ZESTRIL) 40 MG tablet, Take 1 tablet (40 mg total) by mouth daily., Disp: 90 tablet, Rfl: 3 .  montelukast (SINGULAIR) 10 MG tablet, Take 1 tablet (10 mg total) by mouth at bedtime., Disp: 30 tablet, Rfl: 3 .  Multiple Vitamin (MULITIVITAMIN WITH MINERALS) TABS, Take 1 tablet by mouth daily.  , Disp: , Rfl:  .  oxyCODONE-acetaminophen (PERCOCET) 10-325 MG tablet, Pt takes 4 times daily., Disp: , Rfl:  .  pantoprazole (PROTONIX) 40 MG tablet, TAKE 1 TABLET BY MOUTH EVERY DAY, Disp: 30 tablet, Rfl: 1 .  rizatriptan (MAXALT-MLT) 5 MG disintegrating tablet, Take 1 tablet (5 mg total) by mouth as needed for  migraine. May repeat in 2 hours if needed, Disp: 9 tablet, Rfl: 3 .  DULoxetine (CYMBALTA) 30 MG capsule, 1 cap po qd x 15d, then increase to 2 caps po qd, Disp: 45 capsule, Rfl: 0  EXAM:  VITALS per patient if applicable: BP 355/97 (BP Location: Left Arm, Patient Position: Sitting, Cuff Size: Normal)   Pulse 90   Wt 278 lb (126.1 kg)   BMI 49.25 kg/m    GENERAL: alert, oriented, appears well and in no acute distress  HEENT: atraumatic, conjunttiva clear, no obvious abnormalities on inspection of external nose and ears  NECK: normal movements of the head and neck  LUNGS: on inspection no signs of respiratory distress, breathing rate appears normal, no obvious gross SOB, gasping or wheezing  CV: no obvious cyanosis  MS: moves all visible extremities without noticeable abnormality  PSYCH/NEURO: pleasant and cooperative, no obvious depression or anxiety, speech and thought processing grossly intact  LABS: none today  Lab Results  Component Value Date   TSH 1.97 08/07/2017   Lab Results  Component Value Date   WBC 8.9 08/07/2017   HGB 11.7 08/07/2017   HCT 37.8 08/07/2017   MCV 80.4 08/07/2017   PLT 301 08/07/2017   Lab Results  Component Value Date   CREATININE 0.61 08/07/2017   BUN 9 08/07/2017   NA 137 08/07/2017   K 4.0 08/07/2017   CL 100 08/07/2017   CO2 28 08/07/2017   Lab Results  Component Value Date   ALT 18 08/07/2017   AST 14 08/07/2017   ALKPHOS 54 04/19/2014   BILITOT 0.5 08/07/2017   Lab Results  Component Value Date   CHOL 216 (H) 08/07/2017   Lab Results  Component Value Date   HDL 52 08/07/2017   Lab Results  Component Value Date   LDLCALC 130 (H) 08/07/2017   Lab Results  Component Value Date   TRIG 204 (H) 08/07/2017   Lab Results  Component Value Date   CHOLHDL 4.2 08/07/2017    ASSESSMENT AND PLAN:  Discussed the following assessment and plan:  1) GAD and MDD, recurrent (no psychosis): d/c wellbutrin. Start duloxetine  63m qd x 15d, then increase to 2 caps po qd. Continue clonazepam 0.557m1 tab bid.  New rx sent in today for 3 mo supply with 1 RF. [facility] UTD (02/18/18).  2) HTN: The current medical regimen is effective;  continue present plan and medications. RF'd lisinopril.  3) Obesity: she has a lap band that is too constricting. She is adjusting her diet, working with a nutritionist. Unfortunately she is waiting on approval for removal of the lap band.   I discussed the assessment and treatment plan with the patient. The patient was provided an opportunity to ask questions and all were answered. The patient agreed with the plan and demonstrated an understanding of the instructions.   The patient was advised to call back or seek an in-person evaluation if the symptoms worsen or if the condition fails to improve as anticipated.  F/u: 4 wks  Signed:  PhCrissie SicklesMD           07/23/2018

## 2018-08-03 DIAGNOSIS — M791 Myalgia, unspecified site: Secondary | ICD-10-CM | POA: Diagnosis not present

## 2018-08-03 DIAGNOSIS — Z79891 Long term (current) use of opiate analgesic: Secondary | ICD-10-CM | POA: Diagnosis not present

## 2018-08-03 DIAGNOSIS — G894 Chronic pain syndrome: Secondary | ICD-10-CM | POA: Diagnosis not present

## 2018-08-03 DIAGNOSIS — M47816 Spondylosis without myelopathy or radiculopathy, lumbar region: Secondary | ICD-10-CM | POA: Diagnosis not present

## 2018-08-03 DIAGNOSIS — M5106 Intervertebral disc disorders with myelopathy, lumbar region: Secondary | ICD-10-CM | POA: Diagnosis not present

## 2018-08-03 DIAGNOSIS — M5136 Other intervertebral disc degeneration, lumbar region: Secondary | ICD-10-CM | POA: Diagnosis not present

## 2018-08-03 DIAGNOSIS — M4726 Other spondylosis with radiculopathy, lumbar region: Secondary | ICD-10-CM | POA: Diagnosis not present

## 2018-08-04 ENCOUNTER — Encounter: Payer: Self-pay | Admitting: Family Medicine

## 2018-08-04 ENCOUNTER — Telehealth: Payer: Self-pay | Admitting: Family Medicine

## 2018-08-04 MED ORDER — PANTOPRAZOLE SODIUM 40 MG PO TBEC: 40.0000 mg | DELAYED_RELEASE_TABLET | Freq: Two times a day (BID) | ORAL | 1 refills | Status: DC

## 2018-08-04 NOTE — Telephone Encounter (Signed)
Pt is calling and would like  to increase generic protonix 40 mg to next mg. Pt also takes tums for break though acid reflux. [facility] [place].

## 2018-08-04 NOTE — Telephone Encounter (Signed)
Her lap band is too constricting.  This is making her have uncontrollable reflux. She mentioned last f/u visit that she was having trouble getting her surgeon to remove it or adjust it. The 40 mg protonix is the maximum strength pill, but she'll have to start taking it twice a day.  I'll send in new rx and we'll see how she's responding when I see her for scheduled f/u on 08/18/18.-thx

## 2018-08-04 NOTE — Telephone Encounter (Signed)
Contacted patient to see if anything particular triggers heartburn or reflux. She stated nothing in particular triggers increased heartburn and has cut back on certain foods that may cause it to be worse. In the middle of the day, she has to take tums to help.  Please advise, thanks.

## 2018-08-04 NOTE — Telephone Encounter (Signed)
Pt was advised regarding reflux and med was sent.

## 2018-08-06 ENCOUNTER — Other Ambulatory Visit: Payer: Self-pay | Admitting: Family Medicine

## 2018-08-10 ENCOUNTER — Other Ambulatory Visit: Payer: Self-pay | Admitting: Family Medicine

## 2018-08-18 ENCOUNTER — Ambulatory Visit (INDEPENDENT_AMBULATORY_CARE_PROVIDER_SITE_OTHER): Payer: BC Managed Care – PPO | Admitting: Family Medicine

## 2018-08-18 ENCOUNTER — Other Ambulatory Visit: Payer: Self-pay

## 2018-08-18 ENCOUNTER — Encounter: Payer: Self-pay | Admitting: Family Medicine

## 2018-08-18 VITALS — Ht 62.75 in | Wt 278.0 lb

## 2018-08-18 DIAGNOSIS — F330 Major depressive disorder, recurrent, mild: Secondary | ICD-10-CM

## 2018-08-18 DIAGNOSIS — F411 Generalized anxiety disorder: Secondary | ICD-10-CM | POA: Diagnosis not present

## 2018-08-18 NOTE — Progress Notes (Signed)
Virtual Visit via Video Note  I connected with pt on 08/18/18 at  2:00 PM EDT by a video enabled telemedicine application and verified that I am speaking with the correct person using two identifiers.  Location patient: home Location provider:work or home office Persons participating in the virtual visit: patient, provider  I discussed the limitations of evaluation and management by telemedicine and the availability of in person appointments. The patient expressed understanding and agreed to proceed.  Telemedicine visit is a necessity given the COVID-19 restrictions in place at the current time.  HPI: 43 y/o WF being seen today for 3 week f/u GAD/depression Last visit I d/c'd her wellbutrin and started her on duloxetine 30mg  qd with instructions to increase to 2 tabs qd in 15d.  Continued her on clonaz 0.5mg  bid.  Interim hx: She elected to finish out her wellbutrin before switching to duloxetine. She is due to start duloxetine tomorrow. Takes clonaz around noon daily. Has excessive talkativeness and seems hyperfocused and has ability to concentrate on several things at once, starts feeling a lot of internal "hyper" feeling.  She does worry a lot still, has periods of feeling down like she was having when I saw her last.  Still functions, goes to work normally, relationships going ok.  Says she was dx'd with add as a kid but never took meds.  Father and sister with bipolar d/o.  ROS: See pertinent positives and negatives per HPI.  Past Medical History:  Diagnosis Date  . Allergic rhinitis   . Arthritis   . Carpal tunnel syndrome    right wrist, numb all the time  . Cervical dysplasia   . Chronic back pain    Degenerative lumbar spondylosis with grade 2 spondylolisthesis L4 on L5 with bilater pars defects L4.   Marland Kitchen. Chronic bronchitis ([facility])    hx of when she was a smoker: ? mild intermittent asthma?--much better since stopped smoking 2018.  . DDD (degenerative disc disease), lumbar     MRI 10/2015: grade I (7mm) anterolisthesis L4 on L5, w/ disc herniation with encroachment on spinal nerves at L4-S1 levels.  . Depression   . GERD (gastroesophageal reflux disease)    worsened 07/2018 likely associated with lap band being too constricting->increased pantoprazole to 40mg  bid at that time.  Marland Kitchen. History of fracture of phalanx of finger    left, 4th finger distal phalanx  . History of non anemic vitamin B12 deficiency    s/p lap band surgery per pt report.  . Hyperlipidemia   . Hypertension    off all bp meds x 3-4 years  . LAP-BAND surgery status    Pt getting lap band removed and is getting gastric sleave after.  . Migraine syndrome   . Ovarian cyst 10/2015   LEFT, 4 cm--noted on L spine MRI w/out contrast.  F/u u/s imaging showed simple cyst.  . Spondylarthrosis   . Tobacco dependence in remission    quit 2018    Past Surgical History:  Procedure Laterality Date  . CARPAL TUNNEL RELEASE Left   . CERVICAL CONE BIOPSY    . COLONOSCOPY     2015  . ESOPHAGOGASTRODUODENOSCOPY N/A 04/27/2014   Procedure: ESOPHAGOGASTRODUODENOSCOPY (EGD);  Surgeon: Ovidio Kinavid Newman, MD;  [facility];  Service: General;  Laterality: N/A;  . GASTRIC BANDING PORT REVISION N/A 10/02/2014   Procedure: LAPROSCOPIC PLACEMENT OF LAP BAND PORT;  Surgeon: Glenna FellowsBenjamin Hoxworth, MD;  Location: [facility];  Service: General;  Laterality: N/A;  . LAPAROSCOPIC  GASTRIC BANDING  03/2007   [facility]; Dr Jorja Loaim Hipp  . LAPAROSCOPIC REVISION OF GASTRIC BAND N/A 05/17/2012   Procedure: LAPAROSCOPIC REVISION OF SLIPPED GASTRIC BAND;  Surgeon: Mariella SaaBenjamin T Hoxworth, MD;  Location: [facility];  Service: General;  Laterality: N/A;  . LAPAROSCOPY N/A 04/30/2014   Procedure: DIAGNOSTIC LAPAROSCOPY WITH REMOVAL OF INFECTED LAP BAND PORT WITH DEBRIDEMENT SUBCUTANEOUS ABSCESS;  Surgeon: Gaynelle AduEric Wilson, MD;  Location: [facility];  Service: General;  Laterality: N/A;  . VARICOSE VEIN SURGERY Bilateral 2009     Family History  Problem Relation Age of Onset  . Alcohol abuse Mother   . Drug abuse Mother   . Arthritis Mother   . Cancer Mother   . Hyperlipidemia Mother   . Heart disease Mother   . Hypertension Mother   . Non-Hodgkin's lymphoma Mother   . Alcohol abuse Father   . Arthritis Father   . Hyperlipidemia Father   . Heart disease Father   . Stroke Father   . Hypertension Father   . Mental illness Father   . Diabetes Father   . Hyperlipidemia Sister   . Hypertension Sister   . Arthritis Maternal Grandmother   . Cancer Maternal Grandmother   . Hypertension Maternal Grandmother   . Arthritis Maternal Grandfather   . Arthritis Paternal Grandmother   . Cancer Paternal Grandmother   . Hyperlipidemia Paternal Grandmother   . Heart disease Paternal Grandmother   . Hypertension Paternal Grandmother   . Diabetes Paternal Grandmother   . Arthritis Paternal Grandfather   . Hyperlipidemia Paternal Grandfather   . Heart disease Paternal Grandfather   . Stroke Paternal Grandfather   . Hypertension Paternal Grandfather     SOCIAL HX:  Social History   Socioeconomic History  . Marital status: Single    Spouse name: Not on file  . Number of children: Not on file  . Years of education: Not on file  . Highest education level: Not on file  Occupational History  . Not on file  Social Needs  . Financial resource strain: Not on file  . Food insecurity    Worry: Not on file    Inability: Not on file  . Transportation needs    Medical: Not on file    Non-medical: Not on file  Tobacco Use  . Smoking status: Former Smoker    Packs/day: 0.25    Years: 5.00    Pack years: 1.25    Types: Cigarettes  . Smokeless tobacco: Never Used  Substance and Sexual Activity  . Alcohol use: No  . Drug use: No  . Sexual activity: Yes    Birth control/protection: None  Lifestyle  . Physical activity    Days per week: Not on file    Minutes per session: Not on file  . Stress: Not on file   Relationships  . Social Musicianconnections    Talks on phone: Not on file    Gets together: Not on file    Attends religious service: Not on file    Active member of club or organization: Not on file    Attends meetings of clubs or organizations: Not on file    Relationship status: Not on file  Other Topics Concern  . Not on file  Social History Narrative   Single, no children.   Occup: Retail bankerAircraft mechanic   Tobacco: 6 pack-yr hx--quit with wellbutrin.   No alcohol or drugs.      Current Outpatient Medications:  .  albuterol (VENTOLIN HFA) 108 (90 Base) MCG/ACT inhaler, INHALE 2 PUFFS INTO THE LUNGS EVERY 4 (FOUR) HOURS AS NEEDED FOR WHEEZING OR SHORTNESS OF BREATH., Disp: 8 g, Rfl: 0 .  cetirizine (ZYRTEC) 10 MG tablet, Take 20 mg by mouth daily as needed for allergies (allergies). , Disp: , Rfl:  .  clonazePAM (KLONOPIN) 0.5 MG tablet, 1 tab po bid prn, Disp: 180 tablet, Rfl: 1 .  diclofenac (VOLTAREN) 75 MG EC tablet, Take 1 tablet by mouth 2 (two) times daily. , Disp: , Rfl:  .  DULoxetine (CYMBALTA) 30 MG capsule, 2 caps po qd, Disp: 180 capsule, Rfl: 1 .  gabapentin (NEURONTIN) 800 MG tablet, Take 800 mg by mouth 3 (three) times daily., Disp: , Rfl: 6 .  lisinopril (ZESTRIL) 40 MG tablet, Take 1 tablet (40 mg total) by mouth daily., Disp: 90 tablet, Rfl: 3 .  montelukast (SINGULAIR) 10 MG tablet, Take 1 tablet (10 mg total) by mouth at bedtime., Disp: 30 tablet, Rfl: 3 .  Multiple Vitamin (MULITIVITAMIN WITH MINERALS) TABS, Take 1 tablet by mouth daily.  , Disp: , Rfl:  .  oxyCODONE-acetaminophen (PERCOCET) 10-325 MG tablet, Pt takes 4 times daily., Disp: , Rfl:  .  pantoprazole (PROTONIX) 40 MG tablet, Take 1 tablet (40 mg total) by mouth 2 (two) times daily., Disp: 60 tablet, Rfl: 1 .  rizatriptan (MAXALT-MLT) 5 MG disintegrating tablet, Take 1 tablet (5 mg total) by mouth as needed for migraine. May repeat in 2 hours if needed, Disp: 9 tablet, Rfl: 3  EXAM:  VITALS per patient if  applicable: Ht 5' 2.75" (1.594 m)   Wt 278 lb (126.1 kg)   LMP 08/18/2018 (Exact Date)   BMI 49.64 kg/m    GENERAL: alert, oriented, appears well and in no acute distress  HEENT: atraumatic, conjunttiva clear, no obvious abnormalities on inspection of external nose and ears  NECK: normal movements of the head and neck  LUNGS: on inspection no signs of respiratory distress, breathing rate appears normal, no obvious gross SOB, gasping or wheezing  CV: no obvious cyanosis  MS: moves all visible extremities without noticeable abnormality  PSYCH/NEURO: pleasant and cooperative, no obvious depression or anxiety, speech and thought processing grossly intact  LABS: none today    Chemistry      Component Value Date/Time   NA 137 08/07/2017 1508   K 4.0 08/07/2017 1508   CL 100 08/07/2017 1508   CO2 28 08/07/2017 1508   BUN 9 08/07/2017 1508   CREATININE 0.61 08/07/2017 1508      Component Value Date/Time   CALCIUM 9.6 08/07/2017 1508   ALKPHOS 54 04/19/2014 1837   AST 14 08/07/2017 1508   ALT 18 08/07/2017 1508   BILITOT 0.5 08/07/2017 1508      ASSESSMENT AND PLAN:  Discussed the following assessment and plan:  1) MDD and GAD: question possibility of bipolar II d/o. We discussed this and decided to proceed with duloxetine as planned, continue to use clonaz prn.  If not improving in 1 mo OR if worsening before that time then will do trial of lamictal.    I discussed the assessment and treatment plan with the patient. The patient was provided an opportunity to ask questions and all were answered. The patient agreed with the plan and demonstrated an understanding of the instructions.   The patient was advised to call back or seek an in-person evaluation if the symptoms worsen or if the condition fails to improve as anticipated.  F/u: 1 mo  Signed:  Crissie Sickles, MD           08/18/2018

## 2018-09-01 DIAGNOSIS — G43019 Migraine without aura, intractable, without status migrainosus: Secondary | ICD-10-CM | POA: Diagnosis not present

## 2018-09-09 ENCOUNTER — Other Ambulatory Visit: Payer: Self-pay

## 2018-09-09 ENCOUNTER — Ambulatory Visit (INDEPENDENT_AMBULATORY_CARE_PROVIDER_SITE_OTHER): Payer: BC Managed Care – PPO | Admitting: Family Medicine

## 2018-09-09 ENCOUNTER — Encounter: Payer: Self-pay | Admitting: Family Medicine

## 2018-09-09 DIAGNOSIS — K219 Gastro-esophageal reflux disease without esophagitis: Secondary | ICD-10-CM | POA: Diagnosis not present

## 2018-09-09 DIAGNOSIS — F3341 Major depressive disorder, recurrent, in partial remission: Secondary | ICD-10-CM

## 2018-09-09 DIAGNOSIS — F411 Generalized anxiety disorder: Secondary | ICD-10-CM | POA: Diagnosis not present

## 2018-09-09 DIAGNOSIS — G43909 Migraine, unspecified, not intractable, without status migrainosus: Secondary | ICD-10-CM | POA: Diagnosis not present

## 2018-09-09 MED ORDER — RIZATRIPTAN BENZOATE 10 MG PO TABS: 10.0000 mg | ORAL_TABLET | ORAL | 6 refills | Status: DC | PRN

## 2018-09-09 NOTE — Progress Notes (Signed)
Virtual Visit via Video Note  I connected with pt on 09/09/18 at  3:00 PM EDT by a video enabled telemedicine application and verified that I am speaking with the correct person using two identifiers.  Location patient: home Location provider:work or home office Persons participating in the virtual visit: patient, provider  I discussed the limitations of evaluation and management by telemedicine and the availability of in person appointments. The patient expressed understanding and agreed to proceed.  Telemedicine visit is a necessity given the COVID-19 restrictions in place at the current time.  HPI: 43 y/o WF being seen today for 2 week f/u MDD. Last visit we discussed the possibility of bipolar d/o dx but ended up sticking with the plan of seeing how she does on 60mg  qd dose of cymbalta, continue clonaz prn.  Interim hx: Feeling better, more calm, has taken clonaz only once since last visit to help her sleep.  Energy level good, much more up beat. Has only been taking one 30mg  cymbalta (she had not started this med when I saw her last-->I thought she had).  She did try two tabs one day and "I didn't feel like myself->sounds like too lethargic.  No manic/hypomanic behavior.  Sleeping better.  Has had 3 migraines in the last few weeks.  One of them was so bad she went to ED (maxalt no help).  She had vomited repeatedly.  In the ED she got a shot of phenergan.  HA resolved by the end of the night.    Still lots of problems-daily-with GER.  She has consistently eaten a GERD-friendly diet, taking PPI bid and using tums regularly--feels like her GER is horrible still.  No dysphagia or odynophagia. She is awaiting appt with her surgeon to see if she can get her llap band port removed.  ROS: See pertinent positives and negatives per HPI.  Past Medical History:  Diagnosis Date  . Allergic rhinitis   . Arthritis   . Carpal tunnel syndrome    right wrist, numb all the time  . Cervical  dysplasia   . Chronic back pain    Degenerative lumbar spondylosis with grade 2 spondylolisthesis L4 on L5 with bilater pars defects L4.   Marland Kitchen. Chronic bronchitis (HCC)    hx of when she was a smoker: ? mild intermittent asthma?--much better since stopped smoking 2018.  . DDD (degenerative disc disease), lumbar    MRI 10/2015: grade I (7mm) anterolisthesis L4 on L5, w/ disc herniation with encroachment on spinal nerves at L4-S1 levels.  . Depression   . GERD (gastroesophageal reflux disease)    worsened 07/2018 likely associated with lap band being too constricting->increased pantoprazole to 40mg  bid at that time.  Marland Kitchen. History of fracture of phalanx of finger    left, 4th finger distal phalanx  . History of non anemic vitamin B12 deficiency    s/p lap band surgery per pt report.  . Hyperlipidemia   . Hypertension    off all bp meds x 3-4 years  . LAP-BAND surgery status    Pt getting lap band removed and is getting gastric sleave after.  . Migraine syndrome   . Ovarian cyst 10/2015   LEFT, 4 cm--noted on L spine MRI w/out contrast.  F/u u/s imaging showed simple cyst.  . Spondylarthrosis   . Tobacco dependence in remission    quit 2018    Past Surgical History:  Procedure Laterality Date  . CARPAL TUNNEL RELEASE Left   . CERVICAL CONE  BIOPSY    . COLONOSCOPY     2015  . ESOPHAGOGASTRODUODENOSCOPY N/A 04/27/2014   Procedure: ESOPHAGOGASTRODUODENOSCOPY (EGD);  Surgeon: Ovidio Kin, MD;  Location: [facility];  Service: General;  Laterality: N/A;  . GASTRIC BANDING PORT REVISION N/A 10/02/2014   Procedure: LAPROSCOPIC PLACEMENT OF LAP BAND PORT;  Surgeon: Glenna Fellows, MD;  Location: [facility];  Service: General;  Laterality: N/A;  . LAPAROSCOPIC GASTRIC BANDING  03/2007   [facility] - [facility]; Dr Jorja Loa Hipp  . LAPAROSCOPIC REVISION OF GASTRIC BAND N/A 05/17/2012   Procedure: LAPAROSCOPIC REVISION OF SLIPPED GASTRIC BAND;  Surgeon: Mariella Saa, MD;  Location: [facility];  Service: General;  Laterality: N/A;  . LAPAROSCOPY N/A 04/30/2014   Procedure: DIAGNOSTIC LAPAROSCOPY WITH REMOVAL OF INFECTED LAP BAND PORT WITH DEBRIDEMENT SUBCUTANEOUS ABSCESS;  Surgeon: Gaynelle Adu, MD;  Location: [facility];  Service: General;  Laterality: N/A;  . VARICOSE VEIN SURGERY Bilateral 2009    Family History  Problem Relation Age of Onset  . Alcohol abuse Mother   . Drug abuse Mother   . Arthritis Mother   . Cancer Mother   . Hyperlipidemia Mother   . Heart disease Mother   . Hypertension Mother   . Non-Hodgkin's lymphoma Mother   . Alcohol abuse Father   . Arthritis Father   . Hyperlipidemia Father   . Heart disease Father   . Stroke Father   . Hypertension Father   . Mental illness Father   . Diabetes Father   . Hyperlipidemia Sister   . Hypertension Sister   . Arthritis Maternal Grandmother   . Cancer Maternal Grandmother   . Hypertension Maternal Grandmother   . Arthritis Maternal Grandfather   . Arthritis Paternal Grandmother   . Cancer Paternal Grandmother   . Hyperlipidemia Paternal Grandmother   . Heart disease Paternal Grandmother   . Hypertension Paternal Grandmother   . Diabetes Paternal Grandmother   . Arthritis Paternal Grandfather   . Hyperlipidemia Paternal Grandfather   . Heart disease Paternal Grandfather   . Stroke Paternal Grandfather   . Hypertension Paternal Grandfather      Current Outpatient Medications:  .  albuterol (VENTOLIN HFA) 108 (90 Base) MCG/ACT inhaler, INHALE 2 PUFFS INTO THE LUNGS EVERY 4 (FOUR) HOURS AS NEEDED FOR WHEEZING OR SHORTNESS OF BREATH., Disp: 8 g, Rfl: 0 .  cetirizine (ZYRTEC) 10 MG tablet, Take 20 mg by mouth daily as needed for allergies (allergies). , Disp: , Rfl:  .  clonazePAM (KLONOPIN) 0.5 MG tablet, 1 tab po bid prn, Disp: 180 tablet, Rfl: 1 .  diclofenac (VOLTAREN) 75 MG EC tablet, Take 1 tablet by mouth 2 (two) times daily. , Disp: , Rfl:  .  gabapentin (NEURONTIN) 800 MG tablet, Take 800 mg by  mouth 3 (three) times daily., Disp: , Rfl: 6 .  lisinopril (ZESTRIL) 40 MG tablet, Take 1 tablet (40 mg total) by mouth daily., Disp: 90 tablet, Rfl: 3 .  Multiple Vitamin (MULITIVITAMIN WITH MINERALS) TABS, Take 1 tablet by mouth daily.  , Disp: , Rfl:  .  oxyCODONE-acetaminophen (PERCOCET) 10-325 MG tablet, Pt takes 4 times daily., Disp: , Rfl:  .  pantoprazole (PROTONIX) 40 MG tablet, Take 1 tablet (40 mg total) by mouth 2 (two) times daily., Disp: 60 tablet, Rfl: 1 .  rizatriptan (MAXALT-MLT) 5 MG disintegrating tablet, Take 1 tablet (5 mg total) by mouth as needed for migraine. May repeat in 2 hours if needed, Disp: 9 tablet, Rfl:  3 .  DULoxetine (CYMBALTA) 30 MG capsule, 2 caps po qd (Patient not taking: Reported on 08/18/2018), Disp: 180 capsule, Rfl: 1  EXAM:  VITALS per patient if applicable: LMP 89/38/1017 (Exact Date)    GENERAL: alert, oriented, appears well and in no acute distress  HEENT: atraumatic, conjunttiva clear, no obvious abnormalities on inspection of external nose and ears  NECK: normal movements of the head and neck  LUNGS: on inspection no signs of respiratory distress, breathing rate appears normal, no obvious gross SOB, gasping or wheezing  CV: no obvious cyanosis  MS: moves all visible extremities without noticeable abnormality  PSYCH/NEURO: pleasant and cooperative, no obvious depression or anxiety, speech and thought processing grossly intact  LABS: none today.    Chemistry      Component Value Date/Time   NA 137 08/07/2017 1508   K 4.0 08/07/2017 1508   CL 100 08/07/2017 1508   CO2 28 08/07/2017 1508   BUN 9 08/07/2017 1508   CREATININE 0.61 08/07/2017 1508      Component Value Date/Time   CALCIUM 9.6 08/07/2017 1508   ALKPHOS 54 04/19/2014 1837   AST 14 08/07/2017 1508   ALT 18 08/07/2017 1508   BILITOT 0.5 08/07/2017 1508      ASSESSMENT AND PLAN:  Discussed the following assessment and plan:  1) MDD with GAD: improved on cymbalta  30mg  qd for 2+ wks->still need to keep bipolar d/o in back of mind.  Taking clonazepam RARELY. CSC in place/UTD. Will do UDS at f/u in 3 mo.  2) Migraine syndrome: more frequent the last 2 wks, but it is premature to start HA prophylactic med at this time (plus she doesn't want to).  Will have her keep HA diary and will increase her maxalt dose to 10mg  and have her repeat dose in 2h prn.  3) GERD: suspected to be much more problematic due to lap band being too tight. She has to take one more bariatric surg prep class before she is allowed to see the surgeon for consultation regarding getting her lap band removed.  4) Health maintenance: she needs CPE, also needs pap/pelvic (she wants to be referred to GYN for her pap/pelvic but wants to wait and do referral at the time of her cpe in 3 mo).  I discussed the assessment and treatment plan with the patient. The patient was provided an opportunity to ask questions and all were answered. The patient agreed with the plan and demonstrated an understanding of the instructions.   The patient was advised to call back or seek an in-person evaluation if the symptoms worsen or if the condition fails to improve as anticipated.  F/u: 3 mo cpe fasting  Signed:  Crissie Sickles, MD           09/09/2018

## 2018-09-15 ENCOUNTER — Ambulatory Visit: Payer: BC Managed Care – PPO | Admitting: Family Medicine

## 2018-09-26 DIAGNOSIS — G43019 Migraine without aura, intractable, without status migrainosus: Secondary | ICD-10-CM | POA: Diagnosis not present

## 2018-10-05 DIAGNOSIS — I809 Phlebitis and thrombophlebitis of unspecified site: Secondary | ICD-10-CM | POA: Diagnosis not present

## 2018-10-07 ENCOUNTER — Other Ambulatory Visit: Payer: Self-pay | Admitting: Family Medicine

## 2018-10-07 ENCOUNTER — Ambulatory Visit: Payer: BC Managed Care – PPO | Admitting: Family Medicine

## 2018-10-11 DIAGNOSIS — M791 Myalgia, unspecified site: Secondary | ICD-10-CM | POA: Diagnosis not present

## 2018-10-11 DIAGNOSIS — M4726 Other spondylosis with radiculopathy, lumbar region: Secondary | ICD-10-CM | POA: Diagnosis not present

## 2018-10-11 DIAGNOSIS — M5106 Intervertebral disc disorders with myelopathy, lumbar region: Secondary | ICD-10-CM | POA: Diagnosis not present

## 2018-10-11 DIAGNOSIS — G894 Chronic pain syndrome: Secondary | ICD-10-CM | POA: Diagnosis not present

## 2018-10-11 DIAGNOSIS — M5136 Other intervertebral disc degeneration, lumbar region: Secondary | ICD-10-CM | POA: Diagnosis not present

## 2018-10-11 DIAGNOSIS — Z79891 Long term (current) use of opiate analgesic: Secondary | ICD-10-CM | POA: Diagnosis not present

## 2018-10-15 ENCOUNTER — Ambulatory Visit (INDEPENDENT_AMBULATORY_CARE_PROVIDER_SITE_OTHER): Payer: BC Managed Care – PPO | Admitting: Family Medicine

## 2018-10-15 ENCOUNTER — Encounter: Payer: Self-pay | Admitting: Family Medicine

## 2018-10-15 ENCOUNTER — Other Ambulatory Visit: Payer: Self-pay

## 2018-10-15 DIAGNOSIS — F411 Generalized anxiety disorder: Secondary | ICD-10-CM | POA: Diagnosis not present

## 2018-10-15 DIAGNOSIS — I10 Essential (primary) hypertension: Secondary | ICD-10-CM

## 2018-10-15 DIAGNOSIS — F3341 Major depressive disorder, recurrent, in partial remission: Secondary | ICD-10-CM | POA: Diagnosis not present

## 2018-10-15 DIAGNOSIS — G43909 Migraine, unspecified, not intractable, without status migrainosus: Secondary | ICD-10-CM | POA: Diagnosis not present

## 2018-10-15 NOTE — Progress Notes (Signed)
Virtual Visit via Video Note  I connected with Samantha Doyle on 10/15/18 at  2:30 PM EDT by a video enabled telemedicine application and verified that I am speaking with the correct person using two identifiers.  Location patient: home Location provider:work or home office Persons participating in the virtual visit: patient, provider  I discussed the limitations of evaluation and management by telemedicine and the availability of in person appointments. The patient expressed understanding and agreed to proceed.  Telemedicine visit is a necessity given the COVID-19 restrictions in place at the current time.  HPI:  Patient is a 43 y.o. Caucasian female who presents for 1 mo f/u mood disorder, suspected bipolar II vs unipolar dep with GAD.  Last visit she was improved on cymbalta 30 mg qd.  Was taking clonaz rarely.   Was having frequent migraines and was going to do a HA diary.  Update: She had inadvertently been NOT TAKING her lisinopril.  When she restarted it she noted NO FURTHER HA's at all, and her redness in face disappeared.  Says no home bp monitoring but bp check when at her pain clinic f/u recently was "still a little up".  She still feels like her mood is stable on cymbalta 30mg  qd, still taking clonaz on fairly regular basis. PMP AWARE reviewed today and last fill of this med was 08/14/18, #180, rx by me.  No red flags. She says she probably has at least 30 pills left from this bottle still.   ROS: See pertinent positives and negatives per HPI.  Past Medical History:  Diagnosis Date  . Allergic rhinitis   . Arthritis   . Carpal tunnel syndrome    right wrist, numb all the time  . Cervical dysplasia   . Chronic back pain    Degenerative lumbar spondylosis with grade 2 spondylolisthesis L4 on L5 with bilater pars defects L4.   Marland Kitchen Chronic bronchitis ([place])    hx of when she was a smoker: ? mild intermittent asthma?--much better since stopped smoking 2018.  . DDD (degenerative disc  disease), lumbar    MRI 10/2015: grade I (83mm) anterolisthesis L4 on L5, w/ disc herniation with encroachment on spinal nerves at L4-S1 levels.  . Depression   . GERD (gastroesophageal reflux disease)    worsened 07/2018 likely associated with lap band being too constricting->increased pantoprazole to 40mg  bid at that time.  Marland Kitchen History of fracture of phalanx of finger    left, 4th finger distal phalanx  . History of non anemic vitamin B12 deficiency    s/p lap band surgery per Samantha Doyle report.  . Hyperlipidemia   . Hypertension    off all bp meds x 3-4 years  . LAP-BAND surgery status    Samantha Doyle getting lap band removed and is getting gastric sleave after.  . Migraine syndrome   . Ovarian cyst 10/2015   LEFT, 4 cm--noted on L spine MRI w/out contrast.  F/u u/s imaging showed simple cyst.  . Spondylarthrosis   . Tobacco dependence in remission    quit 2018    Past Surgical History:  Procedure Laterality Date  . CARPAL TUNNEL RELEASE Left   . CERVICAL CONE BIOPSY    . COLONOSCOPY     2015  . ESOPHAGOGASTRODUODENOSCOPY N/A 04/27/2014   Procedure: ESOPHAGOGASTRODUODENOSCOPY (EGD);  Surgeon: Alphonsa Overall, MD;  Location: [facility];  Service: General;  Laterality: N/A;  . GASTRIC BANDING PORT REVISION N/A 10/02/2014   Procedure: LAPROSCOPIC PLACEMENT OF LAP BAND PORT;  Surgeon: Marland Kitchen  Hoxworth, MD;  Location: [facility];  Service: General;  Laterality: N/A;  . LAPAROSCOPIC GASTRIC BANDING  03/2007   [facility]; Dr Jorja Loaim Hipp  . LAPAROSCOPIC REVISION OF GASTRIC BAND N/A 05/17/2012   Procedure: LAPAROSCOPIC REVISION OF SLIPPED GASTRIC BAND;  Surgeon: Mariella SaaBenjamin T Hoxworth, MD;  Location: [facility];  Service: General;  Laterality: N/A;  . LAPAROSCOPY N/A 04/30/2014   Procedure: DIAGNOSTIC LAPAROSCOPY WITH REMOVAL OF INFECTED LAP BAND PORT WITH DEBRIDEMENT SUBCUTANEOUS ABSCESS;  Surgeon: Gaynelle AduEric Wilson, MD;  Location: [facility];  Service: General;  Laterality: N/A;  . VARICOSE VEIN SURGERY  Bilateral 2009    Family History  Problem Relation Age of Onset  . Alcohol abuse Mother   . Drug abuse Mother   . Arthritis Mother   . Cancer Mother   . Hyperlipidemia Mother   . Heart disease Mother   . Hypertension Mother   . Non-Hodgkin's lymphoma Mother   . Alcohol abuse Father   . Arthritis Father   . Hyperlipidemia Father   . Heart disease Father   . Stroke Father   . Hypertension Father   . Mental illness Father   . Diabetes Father   . Hyperlipidemia Sister   . Hypertension Sister   . Arthritis Maternal Grandmother   . Cancer Maternal Grandmother   . Hypertension Maternal Grandmother   . Arthritis Maternal Grandfather   . Arthritis Paternal Grandmother   . Cancer Paternal Grandmother   . Hyperlipidemia Paternal Grandmother   . Heart disease Paternal Grandmother   . Hypertension Paternal Grandmother   . Diabetes Paternal Grandmother   . Arthritis Paternal Grandfather   . Hyperlipidemia Paternal Grandfather   . Heart disease Paternal Grandfather   . Stroke Paternal Grandfather   . Hypertension Paternal Grandfather       Current Outpatient Medications:  .  albuterol (VENTOLIN HFA) 108 (90 Base) MCG/ACT inhaler, INHALE 2 PUFFS INTO THE LUNGS EVERY 4 (FOUR) HOURS AS NEEDED FOR WHEEZING OR SHORTNESS OF BREATH., Disp: 8 g, Rfl: 0 .  baclofen (LIORESAL) 10 MG tablet, , Disp: , Rfl:  .  cetirizine (ZYRTEC) 10 MG tablet, Take 20 mg by mouth daily as needed for allergies (allergies). , Disp: , Rfl:  .  clonazePAM (KLONOPIN) 0.5 MG tablet, 1 tab po bid prn, Disp: 180 tablet, Rfl: 1 .  diclofenac (VOLTAREN) 75 MG EC tablet, Take 1 tablet by mouth 2 (two) times daily. , Disp: , Rfl:  .  DULoxetine (CYMBALTA) 30 MG capsule, 2 caps po qd, Disp: 180 capsule, Rfl: 1 .  gabapentin (NEURONTIN) 800 MG tablet, Take 800 mg by mouth 3 (three) times daily., Disp: , Rfl: 6 .  lisinopril (ZESTRIL) 40 MG tablet, Take 1 tablet (40 mg total) by mouth daily., Disp: 90 tablet, Rfl: 3 .   Multiple Vitamin (MULITIVITAMIN WITH MINERALS) TABS, Take 1 tablet by mouth daily.  , Disp: , Rfl:  .  oxyCODONE (ROXICODONE) 15 MG immediate release tablet, TAKE ONE TABLET BY MOUTH EVERY 6 HOURS FOR 30 DAYS, Disp: , Rfl:  .  pantoprazole (PROTONIX) 40 MG tablet, TAKE 1 TABLET BY MOUTH TWICE A DAY, Disp: 60 tablet, Rfl: 1 .  rizatriptan (MAXALT) 10 MG tablet, Take 1 tablet (10 mg total) by mouth as needed for migraine. May repeat in 2 hours if needed, Disp: 10 tablet, Rfl: 6 .  promethazine (PHENERGAN) 25 MG tablet, TAKE 1 TABLET (25 MG TOTAL) BY MOUTH EVERY 8 (EIGHT) HOURS AS NEEDED FOR NAUSEA  OR VOMITING, Disp: , Rfl:   EXAM:  VITALS per patient if applicable: There were no vitals taken for this visit.   GENERAL: alert, oriented, appears well and in no acute distress  HEENT: atraumatic, conjunttiva clear, no obvious abnormalities on inspection of external nose and ears  NECK: normal movements of the head and neck  LUNGS: on inspection no signs of respiratory distress, breathing rate appears normal, no obvious gross SOB, gasping or wheezing  CV: no obvious cyanosis  MS: moves all visible extremities without noticeable abnormality  PSYCH/NEURO: pleasant and cooperative, no obvious depression or anxiety, speech and thought processing grossly intact  ASSESSMENT AND PLAN:  Discussed the following assessment and plan:  1) MDD, recurrent, with GAD: she is in nearly full remission. Continue 30mg  duloxetine qd and clonaz prn. We'll do UDS at f/u visit for CPE in 4-6 wks.  2) Migraine syndrome: now doing excellent since she is back on her lisinopril.  3) HTN: back on lisinopril.  Encouraged her to get bp check at work or home, call if consistently >145/90s. We'll do labs at her f/u in 4-6 wks for her fasting CPE. Of note, she wants to be referred to GYN for her pap/pelvic but wants to wait until the cpe to do the referral.  I discussed the assessment and treatment plan with the  patient. The patient was provided an opportunity to ask questions and all were answered. The patient agreed with the plan and demonstrated an understanding of the instructions.   The patient was advised to call back or seek an in-person evaluation if the symptoms worsen or if the condition fails to improve as anticipated.  F/u: 4-6 wks for CPE, fasting  Signed:  , MD           10/15/2018

## 2018-11-22 DIAGNOSIS — G894 Chronic pain syndrome: Secondary | ICD-10-CM | POA: Diagnosis not present

## 2018-11-22 DIAGNOSIS — M791 Myalgia, unspecified site: Secondary | ICD-10-CM | POA: Diagnosis not present

## 2018-11-22 DIAGNOSIS — Z79891 Long term (current) use of opiate analgesic: Secondary | ICD-10-CM | POA: Diagnosis not present

## 2018-11-22 DIAGNOSIS — M5106 Intervertebral disc disorders with myelopathy, lumbar region: Secondary | ICD-10-CM | POA: Diagnosis not present

## 2018-11-22 DIAGNOSIS — M5136 Other intervertebral disc degeneration, lumbar region: Secondary | ICD-10-CM | POA: Diagnosis not present

## 2018-11-22 DIAGNOSIS — M4726 Other spondylosis with radiculopathy, lumbar region: Secondary | ICD-10-CM | POA: Diagnosis not present

## 2018-12-13 ENCOUNTER — Emergency Department: Payer: BC Managed Care – PPO

## 2018-12-13 ENCOUNTER — Telehealth: Payer: Self-pay

## 2018-12-13 ENCOUNTER — Encounter: Payer: Self-pay | Admitting: Emergency Medicine

## 2018-12-13 ENCOUNTER — Emergency Department
Admission: EM | Admit: 2018-12-13 | Discharge: 2018-12-13 | Disposition: A | Discharge status: Home / Self Care | Payer: BC Managed Care – PPO | Attending: Emergency Medicine | Admitting: Emergency Medicine

## 2018-12-13 ENCOUNTER — Other Ambulatory Visit: Payer: Self-pay

## 2018-12-13 DIAGNOSIS — M7989 Other specified soft tissue disorders: Secondary | ICD-10-CM | POA: Diagnosis not present

## 2018-12-13 DIAGNOSIS — R2242 Localized swelling, mass and lump, left lower limb: Secondary | ICD-10-CM | POA: Diagnosis not present

## 2018-12-13 DIAGNOSIS — I10 Essential (primary) hypertension: Secondary | ICD-10-CM | POA: Diagnosis not present

## 2018-12-13 DIAGNOSIS — Z87891 Personal history of nicotine dependence: Secondary | ICD-10-CM | POA: Diagnosis not present

## 2018-12-13 DIAGNOSIS — R600 Localized edema: Secondary | ICD-10-CM | POA: Diagnosis not present

## 2018-12-13 DIAGNOSIS — R2241 Localized swelling, mass and lump, right lower limb: Secondary | ICD-10-CM | POA: Insufficient documentation

## 2018-12-13 DIAGNOSIS — Z79899 Other long term (current) drug therapy: Secondary | ICD-10-CM | POA: Insufficient documentation

## 2018-12-13 DIAGNOSIS — R609 Edema, unspecified: Secondary | ICD-10-CM

## 2018-12-13 DIAGNOSIS — M79662 Pain in left lower leg: Secondary | ICD-10-CM | POA: Insufficient documentation

## 2018-12-13 NOTE — ED Provider Notes (Signed)
[facility] Emergency Department Provider Note ____________________________________________   First MD Initiated Contact with Patient 12/13/18 2001     (approximate)  I have reviewed the triage vital signs and the nursing notes.   HISTORY  Chief Complaint Leg Swelling    HPI [name] is a 43 y.o. female with PMH as noted below who presents with left leg swelling, worsened over the last 3 or so days, and associated with soreness especially to the inner part of the lower leg.  The patient states that she has some chronic bilateral leg swelling, but the leg swelling on the left has been worse in these last few days.  She denies any trauma or injury.  She has no chest pain or shortness of breath.  She has no history of DVT.  Past Medical History:  Diagnosis Date  . Allergic rhinitis   . Arthritis   . Carpal tunnel syndrome    right wrist, numb all the time  . Cervical dysplasia   . Chronic back pain    Degenerative lumbar spondylosis with grade 2 spondylolisthesis L4 on L5 with bilater pars defects L4.   Marland Kitchen Chronic bronchitis (HCC)    hx of when she was a smoker: ? mild intermittent asthma?--much better since stopped smoking 2018.  . DDD (degenerative disc disease), lumbar    MRI 10/2015: grade I (7mm) anterolisthesis L4 on L5, w/ disc herniation with encroachment on spinal nerves at L4-S1 levels.  . Depression   . GERD (gastroesophageal reflux disease)    worsened 07/2018 likely associated with lap band being too constricting->increased pantoprazole to  bid at that time.  Marland Kitchen History of fracture of phalanx of finger    left, 4th finger distal phalanx  . History of non anemic vitamin B12 deficiency    s/p lap band surgery per pt report.  . Hyperlipidemia   . Hypertension    off all bp meds x 3-4 years  . LAP-BAND surgery status    Pt getting lap band removed and is getting gastric sleave after.  . Migraine syndrome   . Ovarian cyst 10/2015    LEFT, 4 cm--noted on L spine MRI w/out contrast.  F/u u/s imaging showed simple cyst.  . Spondylarthrosis   . Tobacco dependence in remission    quit 2018    Patient Active Problem List   Diagnosis Date Noted  . Chronic pain syndrome 06/18/2018  . 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 Lumbosacral spondylosis without myelopathy 06/18/2018  . Depression   . Spondylolisthesis at L4-L5 level 06/12/2016  . Stiffness of finger joint 05/10/2015  . Mallet deformity of fourth finger, left 02/22/2015  . Migraine 02/16/2015  . GERD (gastroesophageal reflux disease) 02/16/2015  . Tobacco dependence 02/16/2015  . Vitamin B 12 deficiency 02/16/2015  . Lapband APS 2009 [facility] 05/16/2012  . Encounter for fitting or adjustment of gastric lap band 05/13/2012  . Anxiety 02/08/2011  . Essential (primary) hypertension 02/08/2011    Past Surgical History:  Procedure Laterality Date  . CARPAL TUNNEL RELEASE Left   . CERVICAL CONE BIOPSY    . COLONOSCOPY     2015  . ESOPHAGOGASTRODUODENOSCOPY N/A 04/27/2014   Procedure: ESOPHAGOGASTRODUODENOSCOPY (EGD);  Surgeon: Ovidio Kin, MD;  Location: [facility];  Service: General;  Laterality: N/A;  . GASTRIC BANDING PORT REVISION N/A 10/02/2014   Procedure: LAPROSCOPIC PLACEMENT OF LAP BAND PORT;  Surgeon: Glenna Fellows, MD;  Location: [facility];  Service: General;  Laterality: N/A;  . LAPAROSCOPIC GASTRIC BANDING  03/2007   [facility] Dr Jorja Loaim Hipp  . LAPAROSCOPIC REVISION OF GASTRIC BAND N/A 05/17/2012   Procedure: LAPAROSCOPIC REVISION OF SLIPPED GASTRIC BAND;  Surgeon: Mariella SaaBenjamin T Hoxworth, MD;  Location: [facility];  Service: General;  Laterality: N/A;  . LAPAROSCOPY N/A 04/30/2014   Procedure: DIAGNOSTIC LAPAROSCOPY WITH REMOVAL OF INFECTED LAP BAND PORT WITH DEBRIDEMENT SUBCUTANEOUS ABSCESS;  Surgeon: Gaynelle AduEric Wilson, MD;  Location: [facility];  Service: General;  Laterality: N/A;  . VARICOSE VEIN  SURGERY Bilateral 2009    Prior to Admission medications   Medication Sig Start Date End Date Taking? Authorizing Provider  albuterol (VENTOLIN HFA) 108 (90 Base) MCG/ACT inhaler INHALE 2 PUFFS INTO THE LUNGS EVERY 4 (FOUR) HOURS AS NEEDED FOR WHEEZING OR SHORTNESS OF BREATH. 08/11/18   McGowen, Maryjean MornPhilip H, MD  baclofen (LIORESAL) 10 MG tablet     [provider]  cetirizine (ZYRTEC) 10 MG tablet Take 20 mg by mouth daily as needed for allergies (allergies).     [provider]  clonazePAM (KLONOPIN) 0.5 MG tablet 1 tab po bid prn 07/23/18   McGowen, Maryjean MornPhilip H, MD  diclofenac (VOLTAREN) 75 MG EC tablet Take 1 tablet by mouth 2 (two) times daily.  05/22/16   [provider]  DULoxetine (CYMBALTA) 30 MG capsule 2 caps po qd 08/06/18   McGowen, Maryjean MornPhilip H, MD  gabapentin (NEURONTIN) 800 MG tablet Take 800 mg by mouth 3 (three) times daily. 12/31/16   [provider]  lisinopril (ZESTRIL) 40 MG tablet Take 1 tablet (40 mg total) by mouth daily. 07/23/18   McGowen, Maryjean MornPhilip H, MD  Multiple Vitamin (MULITIVITAMIN WITH MINERALS) TABS Take 1 tablet by mouth daily.      [provider]  oxyCODONE (ROXICODONE) 15 MG immediate release tablet TAKE ONE TABLET BY MOUTH EVERY 6 HOURS FOR 30 DAYS 10/11/18   [provider]  pantoprazole (PROTONIX) 40 MG tablet TAKE 1 TABLET BY MOUTH TWICE A DAY 10/07/18   McGowen, Maryjean MornPhilip H, MD  promethazine (PHENERGAN) 25 MG tablet TAKE 1 TABLET (25 MG TOTAL) BY MOUTH EVERY 8 (EIGHT) HOURS AS NEEDED FOR NAUSEA OR VOMITING 09/26/18   [provider]  rizatriptan (MAXALT) 10 MG tablet Take 1 tablet (10 mg total) by mouth as needed for migraine. May repeat in 2 hours if needed 09/09/18   McGowen, Maryjean MornPhilip H, MD    Allergies Vancomycin, Demerol, Meperidine, Monosodium glutamate, and Tape  Family History  Problem Relation Age of Onset  . Alcohol abuse Mother   . Drug abuse Mother   . Arthritis Mother   . Cancer Mother   .  Hyperlipidemia Mother   . Heart disease Mother   . Hypertension Mother   . Non-Hodgkin's lymphoma Mother   . Alcohol abuse Father   . Arthritis Father   . Hyperlipidemia Father   . Heart disease Father   . Stroke Father   . Hypertension Father   . Mental illness Father   . Diabetes Father   . Hyperlipidemia Sister   . Hypertension Sister   . Arthritis Maternal Grandmother   . Cancer Maternal Grandmother   . Hypertension Maternal Grandmother   . Arthritis Maternal Grandfather   . Arthritis Paternal Grandmother   . Cancer Paternal Grandmother   . Hyperlipidemia Paternal Grandmother   . Heart disease Paternal Grandmother   . Hypertension Paternal Grandmother   . Diabetes Paternal Grandmother   . Arthritis Paternal  Grandfather   . Hyperlipidemia Paternal Grandfather   . Heart disease Paternal Grandfather   . Stroke Paternal Grandfather   . Hypertension Paternal Grandfather     Social History Social History   Tobacco Use  . Smoking status: Former Smoker    Packs/day: 0.25    Years: 5.00    Pack years: 1.25    Types: Cigarettes  . Smokeless tobacco: Never Used  Substance Use Topics  . Alcohol use: No  . Drug use: No    Review of Systems  Constitutional: No fever. Eyes: No redness. ENT: No sore throat. Cardiovascular: Denies chest pain. Respiratory: Denies shortness of breath. Gastrointestinal: No vomiting. Genitourinary: Negative for dysuria.  Musculoskeletal: Negative for back pain.  Positive for left leg pain. Skin: Negative for rash. Neurological: Negative for focal weakness or numbness.   ____________________________________________   PHYSICAL EXAM:  VITAL SIGNS: ED Triage Vitals  Enc Vitals Group     BP 12/13/18 1857 (!) 155/86     Pulse Rate 12/13/18 1857 65     Resp 12/13/18 1857 16     Temp 12/13/18 1857 98.8 F (37.1 C)     Temp Source 12/13/18 1857 Oral     SpO2 12/13/18 1857 97 %     Weight 12/13/18 1854 270 lb (122.5 kg)     Height  12/13/18 1854 5\' 2"  (1.575 m)     Head Circumference --      Peak Flow --      Pain Score 12/13/18 1854 6     Pain Loc --      Pain Edu? --      Excl. in [place]? --     Constitutional: Alert and oriented. Well appearing and in no acute distress. Eyes: Conjunctivae are normal.  Head: Atraumatic. Nose: No congestion/rhinnorhea. Mouth/Throat: Mucous membranes are moist.   Neck: Normal range of motion.  Cardiovascular: Normal rate, regular rhythm.Good peripheral circulation. Respiratory: Normal respiratory effort.  No retractions.  Gastrointestinal: No distention.  Musculoskeletal: 1+ edema to the right lower leg, 2+ to the left below the knee.  Intact distal pulses.  Mild medial calf tenderness.  No popliteal or proximal leg tenderness.  Full range of motion at the knee. Neurologic:  Normal speech and language. No gross focal neurologic deficits are appreciated.  Skin:  Skin is warm and dry. No rash noted. Psychiatric: Mood and affect are normal. Speech and behavior are normal.  ____________________________________________   LABS (all labs ordered are listed, but only abnormal results are displayed)  Labs Reviewed - No data to display ____________________________________________  EKG   ____________________________________________  RADIOLOGY    ____________________________________________   PROCEDURES  Procedure(s) performed: No  Procedures  Critical Care performed: No ____________________________________________   INITIAL IMPRESSION / ASSESSMENT AND PLAN / ED COURSE  Pertinent labs & imaging results that were available during my care of the patient were reviewed by me and considered in my medical decision making (see chart for details).  43 year old female with PMH as noted above presents with atraumatic left leg swelling worsened in the last several days.  She reports some chronic bilateral leg swelling at baseline, and states that she stands for many hours at her  job.  She has no chest pain or shortness of breath.  She was sent to the ED for rule out DVT.  On exam she is overall well-appearing.  She has bilateral lower extremity edema but worse on the left.  There is mild tenderness to the medial calf but  no erythema, induration, or cutaneous findings.  There is no bony tenderness and she has normal range of motion.  Differential includes DVT but also possible asymmetrical dependent edema, Baker's cyst, or other benign etiology.  We will obtain a DVT study.  If it is negative, anticipate discharge home with plan for elevation, rest, and PMD follow-up.  ----------------------------------------- 9:20 PM on 12/13/2018 -----------------------------------------  DVT study is negative.  The patient is stable for discharge home.  I counseled her on the results of the work-up and recommended elevation of the legs.  She has PMD follow-up arranged in 3 days.  Return precautions given, and she expresses understanding. ____________________________________________   FINAL CLINICAL IMPRESSION(S) / ED DIAGNOSES  Final diagnoses:  Peripheral edema      NEW MEDICATIONS STARTED DURING THIS VISIT:  New Prescriptions   No medications on file     Note:  This document was prepared using Dragon voice recognition software and may include unintentional dictation errors.    Dionne Bucy, MD 12/13/18 2121

## 2018-12-13 NOTE — Discharge Instructions (Signed)
Keep the legs elevated for the next several days.  You can take Tylenol or ibuprofen for pain.  Return to the ER for new or worsening swelling, severe redness or rash, fever, chest pain, difficulty breathing, or any other new or worsening symptoms that concern you.    Follow-up with your doctor as scheduled.

## 2018-12-13 NOTE — Telephone Encounter (Signed)
Noted  

## 2018-12-13 NOTE — ED Notes (Signed)
US at bedside

## 2018-12-13 NOTE — Telephone Encounter (Signed)
Patient called, reports left lower extremity swelling x 2 days. She reports entire area below knee is pink in color and warm to touch. Denies injury or pain. Patient is typically standing or climbing steps at work, rarely sits. Able to continue work currently. Patient is anxious/worried about waiting for appointment (scheduled tomorrow). Advised patient she can be evaluated at ER for immediate assessment or if symptoms worsen. Pt verbalized understanding, plans to proceed with office visit tomorrow with PCP.

## 2018-12-13 NOTE — ED Notes (Signed)
ED Provider at bedside. 

## 2018-12-13 NOTE — ED Triage Notes (Signed)
C/O left lower leg pain and swelling.  Describes leg and tight and reddened.  Sent to ED to R/O  DVT.

## 2018-12-14 ENCOUNTER — Ambulatory Visit: Payer: BC Managed Care – PPO | Admitting: Family Medicine

## 2018-12-14 NOTE — Progress Notes (Deleted)
OFFICE VISIT  12/14/2018   CC: No chief complaint on file.   HPI:    Patient is a 43 y.o. Caucasian female who presents for leg warmth and swelling the last couple of days.  Past Medical History:  Diagnosis Date  . Allergic rhinitis   . Arthritis   . Carpal tunnel syndrome    right wrist, numb all the time  . Cervical dysplasia   . Chronic back pain    Degenerative lumbar spondylosis with grade 2 spondylolisthesis L4 on L5 with bilater pars defects L4.   Marland Kitchen Chronic bronchitis ([place])    hx of when she was a smoker: ? mild intermittent asthma?--much better since stopped smoking 2018.  . DDD (degenerative disc disease), lumbar    MRI 10/2015: grade I (69mm) anterolisthesis L4 on L5, w/ disc herniation with encroachment on spinal nerves at L4-S1 levels.  . Depression   . GERD (gastroesophageal reflux disease)    worsened 07/2018 likely associated with lap band being too constricting->increased pantoprazole to 40mg  bid at that time.  Marland Kitchen History of fracture of phalanx of finger    left, 4th finger distal phalanx  . History of non anemic vitamin B12 deficiency    s/p lap band surgery per pt report.  . Hyperlipidemia   . Hypertension    off all bp meds x 3-4 years  . LAP-BAND surgery status    Pt getting lap band removed and is getting gastric sleave after.  . Migraine syndrome   . Ovarian cyst 10/2015   LEFT, 4 cm--noted on L spine MRI w/out contrast.  F/u u/s imaging showed simple cyst.  . Spondylarthrosis   . Tobacco dependence in remission    quit 2018    Past Surgical History:  Procedure Laterality Date  . CARPAL TUNNEL RELEASE Left   . CERVICAL CONE BIOPSY    . COLONOSCOPY     2015  . ESOPHAGOGASTRODUODENOSCOPY N/A 04/27/2014   Procedure: ESOPHAGOGASTRODUODENOSCOPY (EGD);  Surgeon: Alphonsa Overall, MD;  Location: [facility];  Service: General;  Laterality: N/A;  . GASTRIC BANDING PORT REVISION N/A 10/02/2014   Procedure: LAPROSCOPIC PLACEMENT OF LAP BAND PORT;  Surgeon:  Excell Seltzer, MD;  Location: [facility];  Service: General;  Laterality: N/A;  . LAPAROSCOPIC GASTRIC BANDING  03/2007   [facility] - [facility]; Dr Octavia Bruckner Hipp  . LAPAROSCOPIC REVISION OF GASTRIC BAND N/A 05/17/2012   Procedure: LAPAROSCOPIC REVISION OF SLIPPED GASTRIC BAND;  Surgeon: Edward Jolly, MD;  Location: [facility];  Service: General;  Laterality: N/A;  . LAPAROSCOPY N/A 04/30/2014   Procedure: DIAGNOSTIC LAPAROSCOPY WITH REMOVAL OF INFECTED LAP BAND PORT WITH DEBRIDEMENT SUBCUTANEOUS ABSCESS;  Surgeon: Greer Pickerel, MD;  Location: [facility];  Service: General;  Laterality: N/A;  . VARICOSE VEIN SURGERY Bilateral 2009    Outpatient Medications Prior to Visit  Medication Sig Dispense Refill  . albuterol (VENTOLIN HFA) 108 (90 Base) MCG/ACT inhaler INHALE 2 PUFFS INTO THE LUNGS EVERY 4 (FOUR) HOURS AS NEEDED FOR WHEEZING OR SHORTNESS OF BREATH. 8 g 0  . baclofen (LIORESAL) 10 MG tablet     . cetirizine (ZYRTEC) 10 MG tablet Take 20 mg by mouth daily as needed for allergies (allergies).     . clonazePAM (KLONOPIN) 0.5 MG tablet 1 tab po bid prn 180 tablet 1  . diclofenac (VOLTAREN) 75 MG EC tablet Take 1 tablet by mouth 2 (two) times daily.     . DULoxetine (CYMBALTA) 30 MG capsule 2 caps po  qd 180 capsule 1  . gabapentin (NEURONTIN) 800 MG tablet Take 800 mg by mouth 3 (three) times daily.  6  . lisinopril (ZESTRIL) 40 MG tablet Take 1 tablet (40 mg total) by mouth daily. 90 tablet 3  . Multiple Vitamin (MULITIVITAMIN WITH MINERALS) TABS Take 1 tablet by mouth daily.      Marland Kitchen oxyCODONE (ROXICODONE) 15 MG immediate release tablet TAKE ONE TABLET BY MOUTH EVERY 6 HOURS FOR 30 DAYS    . pantoprazole (PROTONIX) 40 MG tablet TAKE 1 TABLET BY MOUTH TWICE A DAY 60 tablet 1  . promethazine (PHENERGAN) 25 MG tablet TAKE 1 TABLET (25 MG TOTAL) BY MOUTH EVERY 8 (EIGHT) HOURS AS NEEDED FOR NAUSEA OR VOMITING    . rizatriptan (MAXALT) 10 MG tablet Take 1 tablet (10 mg total) by mouth as  needed for migraine. May repeat in 2 hours if needed 10 tablet 6   No facility-administered medications prior to visit.     Allergies  Allergen Reactions  . Vancomycin Itching and Anaphylaxis    Face/back warm and red.  Pt lips and tongue swelled, hives  . Demerol Hives and Nausea And Vomiting  . Meperidine Nausea And Vomiting and Hives  . Monosodium Glutamate Nausea And Vomiting and Other (See Comments)    migraines  . Tape Rash    Plastic tape    ROS As per HPI  PE: There were no vitals taken for this visit. ***  LABS:    Chemistry      Component Value Date/Time   NA 137 08/07/2017 1508   K 4.0 08/07/2017 1508   CL 100 08/07/2017 1508   CO2 28 08/07/2017 1508   BUN 9 08/07/2017 1508   CREATININE 0.61 08/07/2017 1508      Component Value Date/Time   CALCIUM 9.6 08/07/2017 1508   ALKPHOS 54 04/19/2014 1837   AST 14 08/07/2017 1508   ALT 18 08/07/2017 1508   BILITOT 0.5 08/07/2017 1508     Lab Results  Component Value Date   WBC 8.9 08/07/2017   HGB 11.7 08/07/2017   HCT 37.8 08/07/2017   MCV 80.4 08/07/2017   PLT 301 08/07/2017    IMPRESSION AND PLAN:  No problem-specific Assessment & Plan notes found for this encounter.   An After Visit Summary was printed and given to the patient.  FOLLOW UP: No follow-ups on file.  Signed:  Santiago Bumpers, MD           12/14/2018

## 2018-12-22 ENCOUNTER — Ambulatory Visit: Payer: BC Managed Care – PPO | Admitting: Family Medicine

## 2018-12-22 NOTE — Progress Notes (Deleted)
Office Note 12/22/2018  CC: No chief complaint on file.   HPI:  Samantha Doyle is a 43 y.o. female who is here for ED f/u visit. Went to [facility] ED on 12/13/18 for left lower leg swelling x 3d. She has some chronic bilat LL edema but L leg acutely a bit worse and a bit painful. No hx of DVT.  She does stand all day at work. No recent trauma.  No redness noted on ED exam.  She had no SOB. LE venous doppler u/s was done and showed NO DVT.  Also no mention on radiology report of any popliteal cyst.  Interim hx: ***   Past Medical History:  Diagnosis Date  . Allergic rhinitis   . Arthritis   . Carpal tunnel syndrome    right wrist, numb all the time  . Cervical dysplasia   . Chronic back pain    Degenerative lumbar spondylosis with grade 2 spondylolisthesis L4 on L5 with bilater pars defects L4.   Marland Kitchen Chronic bronchitis ([place])    hx of when she was a smoker: ? mild intermittent asthma?--much better since stopped smoking 2018.  . DDD (degenerative disc disease), lumbar    MRI 10/2015: grade I (37mm) anterolisthesis L4 on L5, w/ disc herniation with encroachment on spinal nerves at L4-S1 levels.  . Depression   . GERD (gastroesophageal reflux disease)    worsened 07/2018 likely associated with lap band being too constricting->increased pantoprazole to 40mg  bid at that time.  Marland Kitchen History of fracture of phalanx of finger    left, 4th finger distal phalanx  . History of non anemic vitamin B12 deficiency    s/p lap band surgery per pt report.  . Hyperlipidemia   . Hypertension    off all bp meds x 3-4 years  . LAP-BAND surgery status    Pt getting lap band removed and is getting gastric sleave after.  . Migraine syndrome   . Ovarian cyst 10/2015   LEFT, 4 cm--noted on L spine MRI w/out contrast.  F/u u/s imaging showed simple cyst.  . Spondylarthrosis   . Tobacco dependence in remission    quit 2018    Past Surgical History:  Procedure Laterality Date   . CARPAL TUNNEL RELEASE Left   . CERVICAL CONE BIOPSY    . COLONOSCOPY     2015  . ESOPHAGOGASTRODUODENOSCOPY N/A 04/27/2014   Procedure: ESOPHAGOGASTRODUODENOSCOPY (EGD);  Surgeon: Alphonsa Overall, MD;  Location: [facility];  Service: General;  Laterality: N/A;  . GASTRIC BANDING PORT REVISION N/A 10/02/2014   Procedure: LAPROSCOPIC PLACEMENT OF LAP BAND PORT;  Surgeon: Excell Seltzer, MD;  Location: [facility];  Service: General;  Laterality: N/A;  . LAPAROSCOPIC GASTRIC BANDING  03/2007   [facility] - [facility]; Dr Octavia Bruckner Hipp  . LAPAROSCOPIC REVISION OF GASTRIC BAND N/A 05/17/2012   Procedure: LAPAROSCOPIC REVISION OF SLIPPED GASTRIC BAND;  Surgeon: Edward Jolly, MD;  Location: [facility];  Service: General;  Laterality: N/A;  . LAPAROSCOPY N/A 04/30/2014   Procedure: DIAGNOSTIC LAPAROSCOPY WITH REMOVAL OF INFECTED LAP BAND PORT WITH DEBRIDEMENT SUBCUTANEOUS ABSCESS;  Surgeon: Greer Pickerel, MD;  Location: [facility];  Service: General;  Laterality: N/A;  . VARICOSE VEIN SURGERY Bilateral 2009    Family History  Problem Relation Age of Onset  . Alcohol abuse Mother   . Drug abuse Mother   . Arthritis Mother   . Cancer Mother   . Hyperlipidemia Mother   . Heart  disease Mother   . Hypertension Mother   . Non-Hodgkin's lymphoma Mother   . Alcohol abuse Father   . Arthritis Father   . Hyperlipidemia Father   . Heart disease Father   . Stroke Father   . Hypertension Father   . Mental illness Father   . Diabetes Father   . Hyperlipidemia Sister   . Hypertension Sister   . Arthritis Maternal Grandmother   . Cancer Maternal Grandmother   . Hypertension Maternal Grandmother   . Arthritis Maternal Grandfather   . Arthritis Paternal Grandmother   . Cancer Paternal Grandmother   . Hyperlipidemia Paternal Grandmother   . Heart disease Paternal Grandmother   . Hypertension Paternal Grandmother   . Diabetes Paternal Grandmother   . Arthritis Paternal Grandfather   .  Hyperlipidemia Paternal Grandfather   . Heart disease Paternal Grandfather   . Stroke Paternal Grandfather   . Hypertension Paternal Grandfather     Social History   Socioeconomic History  . Marital status: Single    Spouse name: Not on file  . Number of children: Not on file  . Years of education: Not on file  . Highest education level: Not on file  Occupational History  . Not on file  Tobacco Use  . Smoking status: Former Smoker    Packs/day: 0.25    Years: 5.00    Pack years: 1.25    Types: Cigarettes  . Smokeless tobacco: Never Used  Substance and Sexual Activity  . Alcohol use: No  . Drug use: No  . Sexual activity: Yes    Birth control/protection: None  Other Topics Concern  . Not on file  Social History Narrative   Single, no children.   Occup: Retail bankerAircraft mechanic   Tobacco: 6 pack-yr hx--quit with wellbutrin.   No alcohol or drugs.   Social Determinants of Health   Financial Resource Strain:   . Difficulty of Paying Living Expenses: Not on file  Food Insecurity:   . Worried About Programme researcher, broadcasting/film/videounning Out of Food in the Last Year: Not on file  . Ran Out of Food in the Last Year: Not on file  Transportation Needs:   . Lack of Transportation (Medical): Not on file  . Lack of Transportation (Non-Medical): Not on file  Physical Activity:   . Days of Exercise per Week: Not on file  . Minutes of Exercise per Session: Not on file  Stress:   . Feeling of Stress : Not on file  Social Connections:   . Frequency of Communication with Friends and Family: Not on file  . Frequency of Social Gatherings with Friends and Family: Not on file  . Attends Religious Services: Not on file  . Active Member of Clubs or Organizations: Not on file  . Attends BankerClub or Organization Meetings: Not on file  . Marital Status: Not on file  Intimate Partner Violence:   . Fear of Current or Ex-Partner: Not on file  . Emotionally Abused: Not on file  . Physically Abused: Not on file  . Sexually  Abused: Not on file    Outpatient Medications Prior to Visit  Medication Sig Dispense Refill  . albuterol (VENTOLIN HFA) 108 (90 Base) MCG/ACT inhaler INHALE 2 PUFFS INTO THE LUNGS EVERY 4 (FOUR) HOURS AS NEEDED FOR WHEEZING OR SHORTNESS OF BREATH. 8 g 0  . baclofen (LIORESAL) 10 MG tablet     . cetirizine (ZYRTEC) 10 MG tablet Take 20 mg by mouth daily as needed for allergies (allergies).     .Marland Kitchen  clonazePAM (KLONOPIN) 0.5 MG tablet 1 tab po bid prn 180 tablet 1  . diclofenac (VOLTAREN) 75 MG EC tablet Take 1 tablet by mouth 2 (two) times daily.     . DULoxetine (CYMBALTA) 30 MG capsule 2 caps po qd 180 capsule 1  . gabapentin (NEURONTIN) 800 MG tablet Take 800 mg by mouth 3 (three) times daily.  6  . lisinopril (ZESTRIL) 40 MG tablet Take 1 tablet (40 mg total) by mouth daily. 90 tablet 3  . Multiple Vitamin (MULITIVITAMIN WITH MINERALS) TABS Take 1 tablet by mouth daily.      Marland Kitchen oxyCODONE (ROXICODONE) 15 MG immediate release tablet TAKE ONE TABLET BY MOUTH EVERY 6 HOURS FOR 30 DAYS    . pantoprazole (PROTONIX) 40 MG tablet TAKE 1 TABLET BY MOUTH TWICE A DAY 60 tablet 1  . promethazine (PHENERGAN) 25 MG tablet TAKE 1 TABLET (25 MG TOTAL) BY MOUTH EVERY 8 (EIGHT) HOURS AS NEEDED FOR NAUSEA OR VOMITING    . rizatriptan (MAXALT) 10 MG tablet Take 1 tablet (10 mg total) by mouth as needed for migraine. May repeat in 2 hours if needed 10 tablet 6   No facility-administered medications prior to visit.    Allergies  Allergen Reactions  . Vancomycin Itching and Anaphylaxis    Face/back warm and red.  Pt lips and tongue swelled, hives  . Demerol Hives and Nausea And Vomiting  . Demerol  [Meperidine Hcl] Other (See Comments)  . Meperidine Nausea And Vomiting and Hives  . Microplegia Msa-Msg  [Plegisol]     Other reaction(s): headache  . Monosodium Glutamate Nausea And Vomiting and Other (See Comments)    migraines  . Tape Rash    Plastic tape    ROS *** PE; There were no vitals taken for  this visit. *** Pertinent labs:  No results found for: DDIMER   Chemistry      Component Value Date/Time   NA 137 08/07/2017 1508   K 4.0 08/07/2017 1508   CL 100 08/07/2017 1508   CO2 28 08/07/2017 1508   BUN 9 08/07/2017 1508   CREATININE 0.61 08/07/2017 1508      Component Value Date/Time   CALCIUM 9.6 08/07/2017 1508   ALKPHOS 54 04/19/2014 1837   AST 14 08/07/2017 1508   ALT 18 08/07/2017 1508   BILITOT 0.5 08/07/2017 1508     Lab Results  Component Value Date   WBC 8.9 08/07/2017   HGB 11.7 08/07/2017   HCT 37.8 08/07/2017   MCV 80.4 08/07/2017   PLT 301 08/07/2017    ASSESSMENT AND PLAN:   No problem-specific Assessment & Plan notes found for this encounter.   An After Visit Summary was printed and given to the patient.  FOLLOW UP:  No follow-ups on file.  Signed:  Santiago Bumpers, MD           12/22/2018

## 2018-12-24 ENCOUNTER — Ambulatory Visit (INDEPENDENT_AMBULATORY_CARE_PROVIDER_SITE_OTHER): Payer: BC Managed Care – PPO | Admitting: Family Medicine

## 2018-12-24 ENCOUNTER — Encounter: Payer: Self-pay | Admitting: Family Medicine

## 2018-12-24 ENCOUNTER — Other Ambulatory Visit: Payer: Self-pay

## 2018-12-24 DIAGNOSIS — R609 Edema, unspecified: Secondary | ICD-10-CM

## 2018-12-24 DIAGNOSIS — M79662 Pain in left lower leg: Secondary | ICD-10-CM

## 2018-12-24 DIAGNOSIS — M7989 Other specified soft tissue disorders: Secondary | ICD-10-CM | POA: Diagnosis not present

## 2018-12-24 MED ORDER — RIZATRIPTAN BENZOATE 10 MG PO TBDP: 10.0000 mg | ORAL_TABLET | ORAL | 6 refills | Status: DC | PRN

## 2018-12-24 NOTE — Progress Notes (Signed)
Virtual Visit via Video Note  I connected with Samantha Doyle on 12/24/18 at  1:30 PM EST by a video enabled telemedicine application and verified that I am speaking with the correct person using two identifiers.  Location patient: home Location provider:work or home office Persons participating in the virtual visit: patient, provider  I discussed the limitations of evaluation and management by telemedicine and the availability of in person appointments. The patient expressed understanding and agreed to proceed.  Telemedicine visit is a necessity given the COVID-19 restrictions in place at the current time.  HPI: 43 y/o WF being seen for f/u recent ED visit on 12/13/18 to [facility] for left leg swelling that had been occurring for several days and worsening, + inner aspect of leg sore as well. Has chronic bilat LL periphe edema.  In ED, L Leg venous doppler u/s that day showed NO DVT, and no mention of popliteal cyst. D/C'd home on no new meds.  PERTINENT IMAGING: 12/13/18 US Venous Img Lower Unilateral Left (DVT eval): CLINICAL DATA:  Left calf pain and swelling  EXAM: LEFT LOWER EXTREMITY VENOUS DOPPLER ULTRASOUND  TECHNIQUE: Gray-scale sonography with graded compression, as well as color Doppler and duplex ultrasound were performed to evaluate the lower extremity deep venous systems from the level of the common femoral vein and including the common femoral, femoral, profunda femoral, popliteal and calf veins including the posterior tibial, peroneal and gastrocnemius veins when visible. The superficial great saphenous vein was also interrogated. Spectral Doppler was utilized to evaluate flow at rest and with distal augmentation maneuvers in the common femoral, femoral and popliteal veins.  COMPARISON:  None.  FINDINGS: Contralateral Common Femoral Vein: Respiratory phasicity is normal and symmetric with the symptomatic side. No evidence of thrombus. Normal  compressibility.  Common Femoral Vein: No evidence of thrombus. Normal compressibility, respiratory phasicity and response to augmentation.  Saphenofemoral Junction: No evidence of thrombus. Normal compressibility and flow on color Doppler imaging.  Profunda Femoral Vein: No evidence of thrombus. Normal compressibility and flow on color Doppler imaging.  Femoral Vein: No evidence of thrombus. Normal compressibility, respiratory phasicity and response to augmentation.  Popliteal Vein: No evidence of thrombus. Normal compressibility, respiratory phasicity and response to augmentation.  Calf Veins: No evidence of thrombus. Normal compressibility and flow on color Doppler imaging.  Superficial Great Saphenous Vein: No evidence of thrombus. Normal compressibility.  Venous Reflux:  Not evaluated on this exam.  Other Findings:  None. IMPRESSION: No evidence of deep venous thrombosis.   Interim hx:  Feeling much better.  She stayed out of work x 2 d at the ED MD's instructions. Wearing medium compression ted hose and shoe inserts and this is helping, the swelling and pain have improved.  Working on eating lower Na diet. NO redness or papules/vesicles.  ROS: See pertinent positives and negatives per HPI.  Past Medical History:  Diagnosis Date  . Allergic rhinitis   . Arthritis   . Carpal tunnel syndrome    right wrist, numb all the time  . Cervical dysplasia   . Chronic back pain    Degenerative lumbar spondylosis with grade 2 spondylolisthesis L4 on L5 with bilater pars defects L4.   Marland Kitchen Chronic bronchitis ([facility])    hx of when she was a smoker: ? mild intermittent asthma?--much better since stopped smoking 2018.  . DDD (degenerative disc disease), lumbar    MRI 10/2015: grade I (40mm) anterolisthesis L4 on L5, w/ disc herniation with encroachment on spinal nerves  at L4-S1 levels.  . Depression   . GERD (gastroesophageal reflux disease)    worsened 07/2018 likely  associated with lap band being too constricting->increased pantoprazole to 40mg  bid at that time.  Marland Kitchen History of fracture of phalanx of finger    left, 4th finger distal phalanx  . History of non anemic vitamin B12 deficiency    s/p lap band surgery per Samantha Doyle report.  . Hyperlipidemia   . Hypertension    off all bp meds x 3-4 years  . LAP-BAND surgery status    Samantha Doyle getting lap band removed and is getting gastric sleave after.  . Migraine syndrome   . Ovarian cyst 10/2015   LEFT, 4 cm--noted on L spine MRI w/out contrast.  F/u u/s imaging showed simple cyst.  . Spondylarthrosis   . Tobacco dependence in remission    quit 2018    Past Surgical History:  Procedure Laterality Date  . CARPAL TUNNEL RELEASE Left   . CERVICAL CONE BIOPSY    . COLONOSCOPY     2015  . ESOPHAGOGASTRODUODENOSCOPY N/A 04/27/2014   Procedure: ESOPHAGOGASTRODUODENOSCOPY (EGD);  Surgeon: Alphonsa Overall, MD;  Location: [facility];  Service: General;  Laterality: N/A;  . GASTRIC BANDING PORT REVISION N/A 10/02/2014   Procedure: LAPROSCOPIC PLACEMENT OF LAP BAND PORT;  Surgeon: Excell Seltzer, MD;  Location: [facility];  Service: General;  Laterality: N/A;  . LAPAROSCOPIC GASTRIC BANDING  03/2007   [facility]; Dr Octavia Bruckner Hipp  . LAPAROSCOPIC REVISION OF GASTRIC BAND N/A 05/17/2012   Procedure: LAPAROSCOPIC REVISION OF SLIPPED GASTRIC BAND;  Surgeon: Edward Jolly, MD;  Location: [facility];  Service: General;  Laterality: N/A;  . LAPAROSCOPY N/A 04/30/2014   Procedure: DIAGNOSTIC LAPAROSCOPY WITH REMOVAL OF INFECTED LAP BAND PORT WITH DEBRIDEMENT SUBCUTANEOUS ABSCESS;  Surgeon: Greer Pickerel, MD;  Location: [facility];  Service: General;  Laterality: N/A;  . VARICOSE VEIN SURGERY Bilateral 2009    Family History  Problem Relation Age of Onset  . Alcohol abuse Mother   . Drug abuse Mother   . Arthritis Mother   . Cancer Mother   . Hyperlipidemia Mother   . Heart disease Mother   . Hypertension  Mother   . Non-Hodgkin's lymphoma Mother   . Alcohol abuse Father   . Arthritis Father   . Hyperlipidemia Father   . Heart disease Father   . Stroke Father   . Hypertension Father   . Mental illness Father   . Diabetes Father   . Hyperlipidemia Sister   . Hypertension Sister   . Arthritis Maternal Grandmother   . Cancer Maternal Grandmother   . Hypertension Maternal Grandmother   . Arthritis Maternal Grandfather   . Arthritis Paternal Grandmother   . Cancer Paternal Grandmother   . Hyperlipidemia Paternal Grandmother   . Heart disease Paternal Grandmother   . Hypertension Paternal Grandmother   . Diabetes Paternal Grandmother   . Arthritis Paternal Grandfather   . Hyperlipidemia Paternal Grandfather   . Heart disease Paternal Grandfather   . Stroke Paternal Grandfather   . Hypertension Paternal Grandfather       Current Outpatient Medications:  .  albuterol (VENTOLIN HFA) 108 (90 Base) MCG/ACT inhaler, INHALE 2 PUFFS INTO THE LUNGS EVERY 4 (FOUR) HOURS AS NEEDED FOR WHEEZING OR SHORTNESS OF BREATH., Disp: 8 g, Rfl: 0 .  baclofen (LIORESAL) 10 MG tablet, Take 10 mg by mouth daily. , Disp: , Rfl:  .  cetirizine (ZYRTEC) 10 MG  tablet, Take 20 mg by mouth daily as needed for allergies (allergies). , Disp: , Rfl:  .  clonazePAM (KLONOPIN) 0.5 MG tablet, 1 tab po bid prn, Disp: 180 tablet, Rfl: 1 .  diclofenac (VOLTAREN) 75 MG EC tablet, Take 1 tablet by mouth 2 (two) times daily. , Disp: , Rfl:  .  DULoxetine (CYMBALTA) 30 MG capsule, 2 caps po qd, Disp: 180 capsule, Rfl: 1 .  gabapentin (NEURONTIN) 800 MG tablet, Take 800 mg by mouth 3 (three) times daily., Disp: , Rfl: 6 .  lisinopril (ZESTRIL) 40 MG tablet, Take 1 tablet (40 mg total) by mouth daily., Disp: 90 tablet, Rfl: 3 .  Multiple Vitamin (MULITIVITAMIN WITH MINERALS) TABS, Take 1 tablet by mouth daily.  , Disp: , Rfl:  .  oxyCODONE (ROXICODONE) 15 MG immediate release tablet, TAKE ONE TABLET BY MOUTH EVERY 6 HOURS FOR 30  DAYS, Disp: , Rfl:  .  pantoprazole (PROTONIX) 40 MG tablet, TAKE 1 TABLET BY MOUTH TWICE A DAY, Disp: 60 tablet, Rfl: 1 .  rizatriptan (MAXALT) 10 MG tablet, Take 1 tablet (10 mg total) by mouth as needed for migraine. May repeat in 2 hours if needed, Disp: 10 tablet, Rfl: 6  EXAM:  VITALS per patient if applicable: There were no vitals taken for this visit.   GENERAL: alert, oriented, appears well and in no acute distress  HEENT: atraumatic, conjunttiva clear, no obvious abnormalities on inspection of external nose and ears  NECK: normal movements of the head and neck  LUNGS: on inspection no signs of respiratory distress, breathing rate appears normal, no obvious gross SOB, gasping or wheezing  CV: no obvious cyanosis  MS: moves all visible extremities without noticeable abnormality  PSYCH/NEURO: pleasant and cooperative, no obvious depression or anxiety, speech and thought processing grossly intact  LABS: none today  Lab Results  Component Value Date   WBC 8.9 08/07/2017   HGB 11.7 08/07/2017   HCT 37.8 08/07/2017   MCV 80.4 08/07/2017   PLT 301 08/07/2017     Chemistry      Component Value Date/Time   NA 137 08/07/2017 1508   K 4.0 08/07/2017 1508   CL 100 08/07/2017 1508   CO2 28 08/07/2017 1508   BUN 9 08/07/2017 1508   CREATININE 0.61 08/07/2017 1508      Component Value Date/Time   CALCIUM 9.6 08/07/2017 1508   ALKPHOS 54 04/19/2014 1837   AST 14 08/07/2017 1508   ALT 18 08/07/2017 1508   BILITOT 0.5 08/07/2017 1508     No results found for: DDIMER   ASSESSMENT AND PLAN:  Discussed the following assessment and plan:  Recent acute left LL pain and swelling. Asymmetric LL venous insufficiency edema: worry of acute DVT recently but doppler normal. She is markedly improved with use of ted hose, low na intake, shoe inserts to help feet pain, and elevation of legs.   I discussed the assessment and treatment plan with the patient. The patient was  provided an opportunity to ask questions and all were answered. The patient agreed with the plan and demonstrated an understanding of the instructions.   The patient was advised to call back or seek an in-person evaluation if the symptoms worsen or if the condition fails to improve as anticipated.  F/u: prn  Signed:  Santiago BumpersPhil Amos Micheals, MD           12/24/2018

## 2018-12-27 ENCOUNTER — Telehealth: Payer: Self-pay

## 2018-12-27 ENCOUNTER — Other Ambulatory Visit: Payer: Self-pay

## 2018-12-27 MED ORDER — PANTOPRAZOLE SODIUM 40 MG PO TBEC: 40.0000 mg | DELAYED_RELEASE_TABLET | Freq: Two times a day (BID) | ORAL | 1 refills | Status: DC

## 2018-12-27 NOTE — Telephone Encounter (Signed)
Patient forgot to ask for a refill of pantoprazole (PROTONIX) 40 MG tablet at her OV last week. Please send it to [facility] in [place]. Thank you

## 2018-12-27 NOTE — Telephone Encounter (Signed)
Rx sent to [facility] [place] 

## 2019-01-07 DIAGNOSIS — D509 Iron deficiency anemia, unspecified: Secondary | ICD-10-CM

## 2019-01-07 DIAGNOSIS — Z1211 Encounter for screening for malignant neoplasm of colon: Secondary | ICD-10-CM

## 2019-01-07 HISTORY — DX: Iron deficiency anemia, unspecified: D50.9

## 2019-01-07 HISTORY — DX: Encounter for screening for malignant neoplasm of colon: Z12.11

## 2019-01-19 ENCOUNTER — Other Ambulatory Visit: Payer: Self-pay | Admitting: Family Medicine

## 2019-01-27 ENCOUNTER — Encounter: Payer: BC Managed Care – PPO | Admitting: Family Medicine

## 2019-01-27 ENCOUNTER — Other Ambulatory Visit: Payer: Self-pay

## 2019-01-27 ENCOUNTER — Ambulatory Visit (INDEPENDENT_AMBULATORY_CARE_PROVIDER_SITE_OTHER): Payer: BC Managed Care – PPO | Admitting: Family Medicine

## 2019-01-27 ENCOUNTER — Encounter: Payer: Self-pay | Admitting: Family Medicine

## 2019-01-27 VITALS — BP 123/85 | HR 62 | Temp 98.4°F | Resp 16 | Ht 62.75 in | Wt 264.6 lb

## 2019-01-27 DIAGNOSIS — I10 Essential (primary) hypertension: Secondary | ICD-10-CM

## 2019-01-27 DIAGNOSIS — L853 Xerosis cutis: Secondary | ICD-10-CM | POA: Diagnosis not present

## 2019-01-27 DIAGNOSIS — Z1211 Encounter for screening for malignant neoplasm of colon: Secondary | ICD-10-CM | POA: Diagnosis not present

## 2019-01-27 DIAGNOSIS — Z1231 Encounter for screening mammogram for malignant neoplasm of breast: Secondary | ICD-10-CM | POA: Diagnosis not present

## 2019-01-27 DIAGNOSIS — Z0000 Encounter for general adult medical examination without abnormal findings: Secondary | ICD-10-CM | POA: Diagnosis not present

## 2019-01-27 DIAGNOSIS — L309 Dermatitis, unspecified: Secondary | ICD-10-CM

## 2019-01-27 MED ORDER — FLUTICASONE PROPIONATE 0.05 % EX CREA: TOPICAL_CREAM | CUTANEOUS | 1 refills | Status: DC

## 2019-01-27 NOTE — Patient Instructions (Signed)
Health Maintenance, Female Adopting a healthy lifestyle and getting preventive care are important in promoting health and wellness. Ask your health care provider about:  The right schedule for you to have regular tests and exams.  Things you can do on your own to prevent diseases and keep yourself healthy. What should I know about diet, weight, and exercise? Eat a healthy diet   Eat a diet that includes plenty of vegetables, fruits, low-fat dairy products, and lean protein.  Do not eat a lot of foods that are high in solid fats, added sugars, or sodium. Maintain a healthy weight Body mass index (BMI) is used to identify weight problems. It estimates body fat based on height and weight. Your health care provider can help determine your BMI and help you achieve or maintain a healthy weight. Get regular exercise Get regular exercise. This is one of the most important things you can do for your health. Most adults should:  Exercise for at least 150 minutes each week. The exercise should increase your heart rate and make you sweat (moderate-intensity exercise).  Do strengthening exercises at least twice a week. This is in addition to the moderate-intensity exercise.  Spend less time sitting. Even light physical activity can be beneficial. Watch cholesterol and blood lipids Have your blood tested for lipids and cholesterol at 45 years of age, then have this test every 5 years. Have your cholesterol levels checked more often if:  Your lipid or cholesterol levels are high.  You are older than 45 years of age.  You are at high risk for heart disease. What should I know about cancer screening? Depending on your health history and family history, you may need to have cancer screening at various ages. This may include screening for:  Breast cancer.  Cervical cancer.  Colorectal cancer.  Skin cancer.  Lung cancer. What should I know about heart disease, diabetes, and high blood  pressure? Blood pressure and heart disease  High blood pressure causes heart disease and increases the risk of stroke. This is more likely to develop in people who have high blood pressure readings, are of African descent, or are overweight.  Have your blood pressure checked: ? Every 3-5 years if you are 18-39 years of age. ? Every year if you are 40 years old or older. Diabetes Have regular diabetes screenings. This checks your fasting blood sugar level. Have the screening done:  Once every three years after age 40 if you are at a normal weight and have a low risk for diabetes.  More often and at a younger age if you are overweight or have a high risk for diabetes. What should I know about preventing infection? Hepatitis B If you have a higher risk for hepatitis B, you should be screened for this virus. Talk with your health care provider to find out if you are at risk for hepatitis B infection. Hepatitis C Testing is recommended for:  Everyone born from 1945 through 1965.  Anyone with known risk factors for hepatitis C. Sexually transmitted infections (STIs)  Get screened for STIs, including gonorrhea and chlamydia, if: ? You are sexually active and are younger than 45 years of age. ? You are older than 45 years of age and your health care provider tells you that you are at risk for this type of infection. ? Your sexual activity has changed since you were last screened, and you are at increased risk for chlamydia or gonorrhea. Ask your health care provider if   you are at risk.  Ask your health care provider about whether you are at high risk for HIV. Your health care provider may recommend a prescription medicine to help prevent HIV infection. If you choose to take medicine to prevent HIV, you should first get tested for HIV. You should then be tested every 3 months for as long as you are taking the medicine. Pregnancy  If you are about to stop having your period (premenopausal) and  you may become pregnant, seek counseling before you get pregnant.  Take 400 to 800 micrograms (mcg) of folic acid every day if you become pregnant.  Ask for birth control (contraception) if you want to prevent pregnancy. Osteoporosis and menopause Osteoporosis is a disease in which the bones lose minerals and strength with aging. This can result in bone fractures. If you are 65 years old or older, or if you are at risk for osteoporosis and fractures, ask your health care provider if you should:  Be screened for bone loss.  Take a calcium or vitamin D supplement to lower your risk of fractures.  Be given hormone replacement therapy (HRT) to treat symptoms of menopause. Follow these instructions at home: Lifestyle  Do not use any products that contain nicotine or tobacco, such as cigarettes, e-cigarettes, and chewing tobacco. If you need help quitting, ask your health care provider.  Do not use street drugs.  Do not share needles.  Ask your health care provider for help if you need support or information about quitting drugs. Alcohol use  Do not drink alcohol if: ? Your health care provider tells you not to drink. ? You are pregnant, may be pregnant, or are planning to become pregnant.  If you drink alcohol: ? Limit how much you use to 0-1 drink a day. ? Limit intake if you are breastfeeding.  Be aware of how much alcohol is in your drink. In the [place] one drink equals one 12 oz bottle of beer (355 mL), one 5 oz glass of wine (148 mL), or one 1 oz glass of hard liquor (44 mL). General instructions  Schedule regular health, dental, and eye exams.  Stay current with your vaccines.  Tell your health care provider if: ? You often feel depressed. ? You have ever been abused or do not feel safe at home. Summary  Adopting a healthy lifestyle and getting preventive care are important in promoting health and wellness.  Follow your health care provider's instructions about healthy  diet, exercising, and getting tested or screened for diseases.  Follow your health care provider's instructions on monitoring your cholesterol and blood pressure. This information is not intended to replace advice given to you by your health care provider. Make sure you discuss any questions you have with your health care provider. Document Revised: 12/16/2017 Document Reviewed: 12/16/2017 Elsevier Patient Education  2020 [employer] Inc[employer]  

## 2019-01-27 NOTE — Progress Notes (Signed)
Office Note 01/27/2019  CC:  Chief Complaint  Patient presents with  . Annual Exam    pt is fasting    HPI:  Samantha Doyle is a 44 y.o. White female who is here for annual health maintenance exam. She does not have a GYN MD.  Peggye Form referred her in the past but she has chosen not to follow through, wants to avoid going to more than one med office if possible.  She would prefer female MD to do her pap/pelvic, though. She says she is as happy with her life as she has been in a very long time. She is not taking cymbalta.  Has chronic anxiety for which cymbalta is taken and helps as well as taking benzo. PMP AWARE reviewed today: most recent rx for clonazepam was filled 12/06/18, # 180, RF x 1, rx by me. She gets pain managed by Dr. Oneal Grout (oxycodone 15mg  tabs). No red flags.  Rash x 3 wks;  L leg, distal thigh, medial aspect with "hard and itchy area".   Applied moisturizer.    Past Medical History:  Diagnosis Date  . Allergic rhinitis   . Arthritis   . Carpal tunnel syndrome    right wrist, numb all the time  . Cervical dysplasia   . Chronic back pain    Degenerative lumbar spondylosis with grade 2 spondylolisthesis L4 on L5 with bilater pars defects L4.   Chronic bronchitis (HCC)    hx of when she was a smoker: ? mild intermittent asthma?--much better since stopped smoking 2018.  . DDD (degenerative disc disease), lumbar    MRI 10/2015: grade I (39mm) anterolisthesis L4 on L5, w/ disc herniation with encroachment on spinal nerves at L4-S1 levels.  . Depression   . GERD (gastroesophageal reflux disease)    worsened 07/2018 likely associated with lap band being too constricting->increased pantoprazole to 40mg  bid at that time.  08/2018 History of fracture of phalanx of finger    left, 4th finger distal phalanx  . History of non anemic vitamin B12 deficiency    s/p lap band surgery per pt report.  . Hyperlipidemia   . Hypertension    off all bp meds x 3-4 years  .  LAP-BAND surgery status    Pt getting lap band removed and is getting gastric sleave after.  . Migraine syndrome   . Ovarian cyst 10/2015   LEFT, 4 cm--noted on L spine MRI w/out contrast.  F/u u/s imaging showed simple cyst.  . Spondylarthrosis   . Tobacco dependence in remission    quit 2018    Past Surgical History:  Procedure Laterality Date  . CARPAL TUNNEL RELEASE Left   . CERVICAL CONE BIOPSY    . COLONOSCOPY     2015  . ESOPHAGOGASTRODUODENOSCOPY N/A 04/27/2014   Procedure: ESOPHAGOGASTRODUODENOSCOPY (EGD);  Surgeon: 2016, MD;  Location: 04/29/2014 ENDOSCOPY;  Service: General;  Laterality: N/A;  . GASTRIC BANDING PORT REVISION N/A 10/02/2014   Procedure: LAPROSCOPIC PLACEMENT OF LAP BAND PORT;  Surgeon: Lucien Mons, MD;  Location: [facility];  Service: General;  Laterality: N/A;  . LAPAROSCOPIC GASTRIC BANDING  03/2007   [facility]; Dr 04/2007 Hipp  . LAPAROSCOPIC REVISION OF GASTRIC BAND N/A 05/17/2012   Procedure: LAPAROSCOPIC REVISION OF SLIPPED GASTRIC BAND;  Surgeon: Jorja Loa, MD;  Location: [facility];  Service: General;  Laterality: N/A;  . LAPAROSCOPY N/A 04/30/2014   Procedure: DIAGNOSTIC LAPAROSCOPY WITH REMOVAL OF INFECTED LAP BAND  PORT WITH DEBRIDEMENT SUBCUTANEOUS ABSCESS;  Surgeon: Gaynelle Adu, MD;  Location: [facility];  Service: General;  Laterality: N/A;  . VARICOSE VEIN SURGERY Bilateral 2009    Family History  Problem Relation Age of Onset  . Alcohol abuse Mother   . Drug abuse Mother   . Arthritis Mother   . Cancer Mother   . Hyperlipidemia Mother   . Heart disease Mother   . Hypertension Mother   . Non-Hodgkin's lymphoma Mother   . Alcohol abuse Father   . Arthritis Father   . Hyperlipidemia Father   . Heart disease Father   . Stroke Father   . Hypertension Father   . Mental illness Father   . Diabetes Father   . Hyperlipidemia Sister   . Hypertension Sister   . Arthritis Maternal Grandmother   . Cancer Maternal  Grandmother   . Hypertension Maternal Grandmother   . Arthritis Maternal Grandfather   . Arthritis Paternal Grandmother   . Cancer Paternal Grandmother   . Hyperlipidemia Paternal Grandmother   . Heart disease Paternal Grandmother   . Hypertension Paternal Grandmother   . Diabetes Paternal Grandmother   . Arthritis Paternal Grandfather   . Hyperlipidemia Paternal Grandfather   . Heart disease Paternal Grandfather   . Stroke Paternal Grandfather   . Hypertension Paternal Grandfather     Social History   Socioeconomic History  . Marital status: Single    Spouse name: Not on file  . Number of children: Not on file  . Years of education: Not on file  . Highest education level: Not on file  Occupational History  . Not on file  Tobacco Use  . Smoking status: Former Smoker    Packs/day: 0.25    Years: 5.00    Pack years: 1.25    Types: Cigarettes  . Smokeless tobacco: Never Used  Substance and Sexual Activity  . Alcohol use: No  . Drug use: No  . Sexual activity: Yes    Birth control/protection: None  Other Topics Concern  . Not on file  Social History Narrative   Single, no children.   Occup: Retail banker   Tobacco: 6 pack-yr hx--quit with wellbutrin.   No alcohol or drugs.   Social Determinants of Health   Financial Resource Strain:   . Difficulty of Paying Living Expenses: Not on file  Food Insecurity:   . Worried About Programme researcher, broadcasting/film/video in the Last Year: Not on file  . Ran Out of Food in the Last Year: Not on file  Transportation Needs:   . Lack of Transportation (Medical): Not on file  . Lack of Transportation (Non-Medical): Not on file  Physical Activity:   . Days of Exercise per Week: Not on file  . Minutes of Exercise per Session: Not on file  Stress:   . Feeling of Stress : Not on file  Social Connections:   . Frequency of Communication with Friends and Family: Not on file  . Frequency of Social Gatherings with Friends and Family: Not on file   . Attends Religious Services: Not on file  . Active Member of Clubs or Organizations: Not on file  . Attends Banker Meetings: Not on file  . Marital Status: Not on file  Intimate Partner Violence:   . Fear of Current or Ex-Partner: Not on file  . Emotionally Abused: Not on file  . Physically Abused: Not on file  . Sexually Abused: Not on file    Outpatient Medications  Prior to Visit  Medication Sig Dispense Refill  . albuterol (VENTOLIN HFA) 108 (90 Base) MCG/ACT inhaler INHALE 2 PUFFS INTO THE LUNGS EVERY 4 (FOUR) HOURS AS NEEDED FOR WHEEZING OR SHORTNESS OF BREATH. 8 g 0  . cetirizine (ZYRTEC) 10 MG tablet Take 20 mg by mouth daily as needed for allergies (allergies).     . clonazePAM (KLONOPIN) 0.5 MG tablet 1 tab po bid prn 180 tablet 1  . diclofenac (VOLTAREN) 75 MG EC tablet Take 1 tablet by mouth 2 (two) times daily.     . DULoxetine (CYMBALTA) 30 MG capsule 2 caps po qd 180 capsule 1  . gabapentin (NEURONTIN) 800 MG tablet Take 800 mg by mouth 3 (three) times daily.  6  . lisinopril (ZESTRIL) 40 MG tablet Take 1 tablet (40 mg total) by mouth daily. 90 tablet 3  . Multiple Vitamin (MULITIVITAMIN WITH MINERALS) TABS Take 1 tablet by mouth daily.      Marland Kitchen oxyCODONE (ROXICODONE) 15 MG immediate release tablet TAKE ONE TABLET BY MOUTH EVERY 6 HOURS FOR 30 DAYS    . pantoprazole (PROTONIX) 40 MG tablet TAKE 1 TABLET BY MOUTH TWICE A DAY 60 tablet 1  . rizatriptan (MAXALT-MLT) 10 MG disintegrating tablet Take 1 tablet (10 mg total) by mouth as needed for migraine. May repeat in 2 hours if needed 10 tablet 6   No facility-administered medications prior to visit.    Allergies  Allergen Reactions  . Vancomycin Itching and Anaphylaxis    Face/back warm and red.  Pt lips and tongue swelled, hives  . Demerol Hives and Nausea And Vomiting  . Demerol  [Meperidine Hcl] Other (See Comments)  . Meperidine Nausea And Vomiting and Hives  . Microplegia Msa-Msg  [Plegisol]      Other reaction(s): headache  . Monosodium Glutamate Nausea And Vomiting and Other (See Comments)    migraines  . Tape Rash    Plastic tape    ROS Review of Systems  Constitutional: Negative for appetite change, chills, fatigue and fever.  HENT: Negative for congestion, dental problem, ear pain and sore throat.   Eyes: Negative for discharge, redness and visual disturbance.  Respiratory: Negative for cough, chest tightness, shortness of breath and wheezing.   Cardiovascular: Negative for chest pain, palpitations and leg swelling.  Gastrointestinal: Negative for abdominal pain, blood in stool, diarrhea, nausea and vomiting.  Genitourinary: Negative for difficulty urinating, dysuria, flank pain, frequency, hematuria and urgency.  Musculoskeletal: Negative for arthralgias, back pain, joint swelling, myalgias and neck stiffness.  Skin: Positive for rash (see hpi). Negative for pallor.  Neurological: Negative for dizziness, speech difficulty, weakness and headaches.  Hematological: Negative for adenopathy. Does not bruise/bleed easily.  Psychiatric/Behavioral: Negative for confusion and sleep disturbance. The patient is not nervous/anxious.     PE; Blood pressure 123/85, pulse 62, temperature 98.4 F (36.9 C), temperature source Temporal, resp. rate 16, height 5' 2.75" (1.594 m), weight 264 lb 9.6 oz (120 kg), last menstrual period 01/19/2019, SpO2 98 %. Body mass index is 47.25 kg/m. Exam chaperoned by Emi Holes, CMA.  Gen: Alert, well appearing.  Patient is oriented to person, place, time, and situation. AFFECT: pleasant, lucid thought and speech. ENT: Ears: EACs clear, normal epithelium.  TMs with good light reflex and landmarks bilaterally.  Eyes: no injection, icteris, swelling, or exudate.  EOMI, PERRLA. Nose: no drainage or turbinate edema/swelling.  No injection or focal lesion.  Mouth: lips without lesion/swelling.  Oral mucosa pink and moist.  Dentition intact  and without  obvious caries or gingival swelling.  Oropharynx without erythema, exudate, or swelling.  Neck: supple/nontender.  No LAD, mass, or TM.  Carotid pulses 2+ bilaterally, without bruits. CV: RRR, no m/r/g.   LUNGS: CTA bilat, nonlabored resps, good aeration in all lung fields. ABD: soft, NT, ND, BS normal.  No hepatospenomegaly or mass.  No bruits. EXT: no clubbing or cyanosis.  Has some scattered, non inflamed varicosities, 1+ pitting edema bilat.  Musculoskeletal: no joint swelling, erythema, warmth, or tenderness.  ROM of all joints intact. Skin - no sores or suspicious lesions.  Distal aspect of L thigh medially has light pinkish/brown freckling that is mildly palpable papular rash, borders not well demarcated.  No vesicles or pustules or hives.   She has mildly dry skin diffusely on legs.  Pertinent labs:  Lab Results  Component Value Date   TSH 1.97 08/07/2017   Lab Results  Component Value Date   WBC 8.9 08/07/2017   HGB 11.7 08/07/2017   HCT 37.8 08/07/2017   MCV 80.4 08/07/2017   PLT 301 08/07/2017   Lab Results  Component Value Date   CREATININE 0.61 08/07/2017   BUN 9 08/07/2017   NA 137 08/07/2017   K 4.0 08/07/2017   CL 100 08/07/2017   CO2 28 08/07/2017   Lab Results  Component Value Date   ALT 18 08/07/2017   AST 14 08/07/2017   ALKPHOS 54 04/19/2014   BILITOT 0.5 08/07/2017   Lab Results  Component Value Date   CHOL 216 (H) 08/07/2017   Lab Results  Component Value Date   HDL 52 08/07/2017   Lab Results  Component Value Date   LDLCALC 130 (H) 08/07/2017   Lab Results  Component Value Date   TRIG 204 (H) 08/07/2017   Lab Results  Component Value Date   CHOLHDL 4.2 08/07/2017    ASSESSMENT AND PLAN:   Health maintenance exam: Reviewed age and gender appropriate health maintenance issues (prudent diet, regular exercise, health risks of tobacco and excessive alcohol, use of seatbelts, fire alarms in home, use of sunscreen).  Also reviewed age  and gender appropriate health screening as well as vaccine recommendations. Vaccines: Tdap UTD.  Flu UTD. Labs: fasting HP labs ordered. Cervical ca screening: Most recent pap was approx :  Pt can't recall.-->she asks if our female MD, Dr. Raoul Pitch, could do her pap/pelvic and I said I would check and see. Breast ca screening: screening mammogram ordered today. Colon ca screening: per latest guidelines, start at age 63->ordered GI referral today.  Her left leg rash is most likely eczematous.  Encouraged her to use moisturizer daily. Rx'd cutivate 0.05% cream to apply bid prn to affected area/rash  An After Visit Summary was printed and given to the patient.  FOLLOW UP:  Return in about 6 months (around 07/27/2019) for routine chronic illness f/u.  Signed:  Crissie Sickles, MD           01/27/2019

## 2019-01-28 LAB — CBC WITH DIFFERENTIAL/PLATELET
Basophils Absolute: 0.1 10*3/uL (ref 0.0–0.1)
Basophils Relative: 0.9 % (ref 0.0–3.0)
Eosinophils Absolute: 0.2 10*3/uL (ref 0.0–0.7)
Eosinophils Relative: 3.1 % (ref 0.0–5.0)
HCT: 32 % — ABNORMAL LOW (ref 36.0–46.0)
Hemoglobin: 9.6 g/dL — ABNORMAL LOW (ref 12.0–15.0)
Lymphocytes Relative: 22.5 % (ref 12.0–46.0)
Lymphs Abs: 1.2 10*3/uL (ref 0.7–4.0)
MCHC: 30 g/dL (ref 30.0–36.0)
MCV: 71 fl — ABNORMAL LOW (ref 78.0–100.0)
Monocytes Absolute: 0.4 10*3/uL (ref 0.1–1.0)
Monocytes Relative: 7.9 % (ref 3.0–12.0)
Neutro Abs: 3.6 10*3/uL (ref 1.4–7.7)
Neutrophils Relative %: 65.6 % (ref 43.0–77.0)
Platelets: 226 10*3/uL (ref 150.0–400.0)
RBC: 4.51 Mil/uL (ref 3.87–5.11)
RDW: 18.7 % — ABNORMAL HIGH (ref 11.5–15.5)
WBC: 5.5 10*3/uL (ref 4.0–10.5)

## 2019-01-28 LAB — COMPREHENSIVE METABOLIC PANEL
ALT: 15 U/L (ref 0–35)
AST: 20 U/L (ref 0–37)
Albumin: 3.9 g/dL (ref 3.5–5.2)
Alkaline Phosphatase: 62 U/L (ref 39–117)
BUN: 11 mg/dL (ref 6–23)
CO2: 29 mEq/L (ref 19–32)
Calcium: 8.9 mg/dL (ref 8.4–10.5)
Chloride: 102 mEq/L (ref 96–112)
Creatinine, Ser: 0.73 mg/dL (ref 0.40–1.20)
GFR: 86.22 mL/min (ref >60.00)
Glucose, Bld: 80 mg/dL (ref 70–99)
Potassium: 3.9 mEq/L (ref 3.5–5.1)
Sodium: 140 mEq/L (ref 135–145)
Total Bilirubin: 0.5 mg/dL (ref 0.2–1.2)
Total Protein: 6.7 g/dL (ref 6.0–8.3)

## 2019-01-28 LAB — LIPID PANEL
Cholesterol: 184 mg/dL (ref 0–200)
HDL: 45.6 mg/dL (ref >39.00)
LDL Cholesterol: 122 mg/dL — ABNORMAL HIGH (ref 0–99)
NonHDL: 138.27
Total CHOL/HDL Ratio: 4
Triglycerides: 81 mg/dL (ref 0.0–149.0)
VLDL: 16.2 mg/dL (ref 0.0–40.0)

## 2019-01-28 LAB — TSH: TSH: 2.36 u[IU]/mL (ref 0.35–4.50)

## 2019-01-31 ENCOUNTER — Other Ambulatory Visit: Payer: BC Managed Care – PPO

## 2019-01-31 ENCOUNTER — Other Ambulatory Visit: Payer: Self-pay | Admitting: Family Medicine

## 2019-01-31 DIAGNOSIS — D509 Iron deficiency anemia, unspecified: Secondary | ICD-10-CM

## 2019-01-31 DIAGNOSIS — Z79891 Long term (current) use of opiate analgesic: Secondary | ICD-10-CM | POA: Diagnosis not present

## 2019-01-31 DIAGNOSIS — M961 Postlaminectomy syndrome, not elsewhere classified: Secondary | ICD-10-CM | POA: Diagnosis not present

## 2019-01-31 DIAGNOSIS — M791 Myalgia, unspecified site: Secondary | ICD-10-CM | POA: Diagnosis not present

## 2019-01-31 DIAGNOSIS — G894 Chronic pain syndrome: Secondary | ICD-10-CM | POA: Diagnosis not present

## 2019-01-31 DIAGNOSIS — M5136 Other intervertebral disc degeneration, lumbar region: Secondary | ICD-10-CM | POA: Diagnosis not present

## 2019-01-31 DIAGNOSIS — M4726 Other spondylosis with radiculopathy, lumbar region: Secondary | ICD-10-CM | POA: Diagnosis not present

## 2019-01-31 DIAGNOSIS — M5106 Intervertebral disc disorders with myelopathy, lumbar region: Secondary | ICD-10-CM | POA: Diagnosis not present

## 2019-02-01 ENCOUNTER — Encounter: Payer: Self-pay | Admitting: Family Medicine

## 2019-02-01 ENCOUNTER — Other Ambulatory Visit: Payer: Self-pay

## 2019-02-01 DIAGNOSIS — D509 Iron deficiency anemia, unspecified: Secondary | ICD-10-CM

## 2019-02-01 LAB — IRON: Iron: 19 ug/dL — ABNORMAL LOW (ref 40–190)

## 2019-02-01 LAB — FERRITIN: Ferritin: 11 ng/mL — ABNORMAL LOW (ref 16–232)

## 2019-02-01 LAB — TRANSFERRIN: Transferrin: 301 mg/dL (ref 188–341)

## 2019-02-02 ENCOUNTER — Other Ambulatory Visit: Payer: Self-pay | Admitting: Family Medicine

## 2019-02-10 ENCOUNTER — Other Ambulatory Visit: Payer: Self-pay | Admitting: Family Medicine

## 2019-02-22 DIAGNOSIS — M47816 Spondylosis without myelopathy or radiculopathy, lumbar region: Secondary | ICD-10-CM | POA: Diagnosis not present

## 2019-02-22 DIAGNOSIS — M791 Myalgia, unspecified site: Secondary | ICD-10-CM | POA: Diagnosis not present

## 2019-03-03 ENCOUNTER — Encounter: Payer: Self-pay | Admitting: Family Medicine

## 2019-03-22 ENCOUNTER — Encounter: Payer: Self-pay | Admitting: Family Medicine

## 2019-04-25 DIAGNOSIS — M5106 Intervertebral disc disorders with myelopathy, lumbar region: Secondary | ICD-10-CM | POA: Diagnosis not present

## 2019-04-25 DIAGNOSIS — G894 Chronic pain syndrome: Secondary | ICD-10-CM | POA: Diagnosis not present

## 2019-04-25 DIAGNOSIS — M5136 Other intervertebral disc degeneration, lumbar region: Secondary | ICD-10-CM | POA: Diagnosis not present

## 2019-04-25 DIAGNOSIS — M4726 Other spondylosis with radiculopathy, lumbar region: Secondary | ICD-10-CM | POA: Diagnosis not present

## 2019-04-25 DIAGNOSIS — Z79891 Long term (current) use of opiate analgesic: Secondary | ICD-10-CM | POA: Diagnosis not present

## 2019-04-25 DIAGNOSIS — M791 Myalgia, unspecified site: Secondary | ICD-10-CM | POA: Diagnosis not present

## 2019-05-18 ENCOUNTER — Other Ambulatory Visit: Payer: Self-pay | Admitting: Family Medicine

## 2019-07-04 ENCOUNTER — Other Ambulatory Visit: Payer: Self-pay | Admitting: Family Medicine

## 2019-07-04 NOTE — Telephone Encounter (Signed)
RF request for cymbalta 30 mg.     Looks like this was d/c'd on 01/27/19 but patient has an upcoming appointment 07/14/19 to " discuss cymbalta since increasing dose"  Please advise rf.

## 2019-07-04 NOTE — Telephone Encounter (Signed)
I don't really know what she is talking about. She was not taking the cymbalta anymore when I last saw her Jan this year and I did not restart it at that time. I'll deny this RF and we'll discuss the situation at upcoming o/v.-thx

## 2019-07-05 MED ORDER — DULOXETINE HCL 30 MG PO CPEP: ORAL_CAPSULE | ORAL | 0 refills | Status: DC

## 2019-07-05 NOTE — Telephone Encounter (Signed)
LMOM for pt to CB to discuss refill request.

## 2019-07-05 NOTE — Addendum Note (Signed)
Addended by: Jeoffrey Massed on: 07/05/2019 10:57 PM   Modules accepted: Orders

## 2019-07-05 NOTE — Telephone Encounter (Signed)
Patient states she has not stopped cymbalta, she stopped paxil due to side effects.  She states she has been taking cymbalta regularly for a long time and just increased it to cymbalta 30 mg 2 capsules per day in the last two months.  Patient has appointment w/ you 07/14/19.  Okay to send in enough meds to get her to her appointment.  She took her last capsules this morning. Please advise.

## 2019-07-05 NOTE — Telephone Encounter (Signed)
Pls tell her I'm sorry about the confusion. I just sent in cymbalta 30mg , 2 caps per day, #30, no RF.

## 2019-07-06 NOTE — Telephone Encounter (Signed)
Patient aware.

## 2019-07-08 ENCOUNTER — Other Ambulatory Visit: Payer: Self-pay

## 2019-07-13 ENCOUNTER — Other Ambulatory Visit: Payer: Self-pay | Admitting: Family Medicine

## 2019-07-14 ENCOUNTER — Other Ambulatory Visit: Payer: Self-pay

## 2019-07-14 ENCOUNTER — Encounter: Payer: Self-pay | Admitting: Family Medicine

## 2019-07-14 ENCOUNTER — Telehealth (INDEPENDENT_AMBULATORY_CARE_PROVIDER_SITE_OTHER): Payer: BC Managed Care – PPO | Admitting: Family Medicine

## 2019-07-14 VITALS — BP 132/89 | Wt 245.0 lb

## 2019-07-14 DIAGNOSIS — F3342 Major depressive disorder, recurrent, in full remission: Secondary | ICD-10-CM | POA: Diagnosis not present

## 2019-07-14 DIAGNOSIS — T819XXA Unspecified complication of procedure, initial encounter: Secondary | ICD-10-CM | POA: Insufficient documentation

## 2019-07-14 DIAGNOSIS — F411 Generalized anxiety disorder: Secondary | ICD-10-CM | POA: Diagnosis not present

## 2019-07-14 MED ORDER — CLONAZEPAM 0.5 MG PO TABS: ORAL_TABLET | ORAL | 0 refills | Status: DC

## 2019-07-14 MED ORDER — DULOXETINE HCL 30 MG PO CPEP: ORAL_CAPSULE | ORAL | 3 refills | Status: DC

## 2019-07-14 NOTE — Progress Notes (Signed)
Virtual Visit via Video Note  I connected with pt on 07/14/19 at  3:00 PM EDT by telephone (b/c video enabled telemedicine application FAILED) and verified that I am speaking with the correct person using two identifiers.  Location patient: home Location provider:work or home office Persons participating in the virtual visit: patient, provider  I discussed the limitations of evaluation and management by telemedicine and the availability of in person appointments. The patient expressed understanding and agreed to proceed.  Telemedicine visit is a necessity given the COVID-19 restrictions in place at the current time.  HPI: 44 y/o WF with whom I am doing a telephone visit today (due to COVID-19 pandemic restrictions)for f/u anx/dep (video visit technology failed) Cymbalta dose: taking one 56m qd, was apprehensive about taking 2 qd. Was told by a few people at work that she was being "mean". Also noted some sadness when at home from work. Started to take 2 per day and about 2-3 wks later she began to feel less anger,irritability, and sadness.  She is happy with taking 1 qam and 1 qpm.  Clonazepam: takes 1 tab qod typically,  has not had it filled in a long time. This helps her acute flares of anxiety and some anxiety-related insomnia very well. Needs this refilled.    PMP AWARE reviewed today: most recent rx for clonaz was filled 11/2018, #180, by me.  No red flags.  ROS: See pertinent positives and negatives per HPI.  Past Medical History:  Diagnosis Date   Allergic rhinitis    Arthritis    Carpal tunnel syndrome    right wrist, numb all the time   Cervical dysplasia    Chronic back pain    Degenerative lumbar spondylosis with grade 2 spondylolisthesis L4 on L5 with bilater pars defects L4.    [place]    hx of when she was a smoker: ? mild intermittent asthma?--much better since stopped smoking 2018.   Colon cancer screening 01/2019   GI was unable to  get in contact with pt   DDD (degenerative disc disease), lumbar    MRI 10/2015: grade I (763m anterolisthesis L4 on L5, w/ disc herniation with encroachment on spinal nerves at L4-S1 levels.   Depression    GERD (gastroesophageal reflux disease)    worsened 07/2018 likely associated with lap band being too constricting->increased pantoprazole to 4059mid at that time.   History of fracture of phalanx of finger    left, 4th finger distal phalanx   History of non anemic vitamin B12 deficiency    s/p lap band surgery per pt report.   Hyperlipidemia    Hypertension    off all bp meds x 3-4 years   Iron deficiency anemia 01/2019   hemoccults x 3 pending, GI referral. GI was unable to get in contact with pt   LAP-BAND surgery status    Pt getting lap band removed and is getting gastric sleave after.   Migraine syndrome    Ovarian cyst 10/2015   LEFT, 4 cm--noted on L spine MRI w/out contrast.  F/u u/s imaging showed simple cyst.   Spondylarthrosis    Tobacco dependence in remission    quit 2018    Past Surgical History:  Procedure Laterality Date   CARPAL TUNNEL RELEASE Left    CERVICAL CONE BIOPSY     COLONOSCOPY     2015   ESOPHAGOGASTRODUODENOSCOPY N/A 04/27/2014   Procedure: ESOPHAGOGASTRODUODENOSCOPY (EGD);  Surgeon: DavAlphonsa OverallD;  Location: [facility];  Service: General;  Laterality: N/A;   GASTRIC BANDING PORT REVISION N/A 10/02/2014   Procedure: LAPROSCOPIC PLACEMENT OF LAP BAND PORT;  Surgeon: Excell Seltzer, MD;  Location: [facility];  Service: General;  Laterality: N/A;   LAPAROSCOPIC GASTRIC BANDING  03/2007   [facility] - [facility]; Dr Octavia Bruckner Hipp   LAPAROSCOPIC REVISION OF GASTRIC BAND N/A 05/17/2012   Procedure: LAPAROSCOPIC REVISION OF SLIPPED GASTRIC BAND;  Surgeon: Edward Jolly, MD;  Location: [facility];  Service: General;  Laterality: N/A;   LAPAROSCOPY N/A 04/30/2014   Procedure: DIAGNOSTIC LAPAROSCOPY WITH REMOVAL OF INFECTED  LAP BAND PORT WITH DEBRIDEMENT SUBCUTANEOUS ABSCESS;  Surgeon: Greer Pickerel, MD;  Location: [facility];  Service: General;  Laterality: N/A;   VARICOSE VEIN SURGERY Bilateral 2009    Family History  Problem Relation Age of Onset   Alcohol abuse Mother    Drug abuse Mother    Arthritis Mother    Cancer Mother    Hyperlipidemia Mother    Heart disease Mother    Hypertension Mother    Non-Hodgkin's lymphoma Mother    Alcohol abuse Father    Arthritis Father    Hyperlipidemia Father    Heart disease Father    Stroke Father    Hypertension Father    Mental illness Father    Diabetes Father    Hyperlipidemia Sister    Hypertension Sister    Arthritis Maternal Grandmother    Cancer Maternal Grandmother    Hypertension Maternal Grandmother    Arthritis Maternal Grandfather    Arthritis Paternal Grandmother    Cancer Paternal Grandmother    Hyperlipidemia Paternal Grandmother    Heart disease Paternal Grandmother    Hypertension Paternal Grandmother    Diabetes Paternal Grandmother    Arthritis Paternal Grandfather    Hyperlipidemia Paternal Grandfather    Heart disease Paternal Grandfather    Stroke Paternal Grandfather    Hypertension Paternal Grandfather      Current Outpatient Medications:    albuterol (VENTOLIN HFA) 108 (90 Base) MCG/ACT inhaler, INHALE 2 PUFFS INTO THE LUNGS EVERY 4 (FOUR) HOURS AS NEEDED FOR WHEEZING OR SHORTNESS OF BREATH., Disp: 8 g, Rfl: 0   baclofen (LIORESAL) 10 MG tablet, Take 10 mg by mouth every 8 (eight) hours., Disp: , Rfl:    cetirizine (ZYRTEC) 10 MG tablet, Take 20 mg by mouth daily as needed for allergies (allergies). , Disp: , Rfl:    clonazePAM (KLONOPIN) 0.5 MG tablet, 1 tab po bid prn, Disp: 180 tablet, Rfl: 1   diclofenac (VOLTAREN) 75 MG EC tablet, Take 1 tablet by mouth 2 (two) times daily. , Disp: , Rfl:    DULoxetine (CYMBALTA) 30 MG capsule, 2 caps po qd, Disp: 30 capsule, Rfl: 0   gabapentin  (NEURONTIN) 800 MG tablet, Take 800 mg by mouth 3 (three) times daily., Disp: , Rfl: 6   lisinopril (ZESTRIL) 40 MG tablet, Take 1 tablet (40 mg total) by mouth daily., Disp: 90 tablet, Rfl: 3   Multiple Vitamin (MULITIVITAMIN WITH MINERALS) TABS, Take 1 tablet by mouth daily.  , Disp: , Rfl:    oxyCODONE (ROXICODONE) 15 MG immediate release tablet, TAKE ONE TABLET BY MOUTH EVERY 6 HOURS FOR 30 DAYS, Disp: , Rfl:    pantoprazole (PROTONIX) 40 MG tablet, TAKE 1 TABLET BY MOUTH TWICE A DAY, Disp: 180 tablet, Rfl: 1   rizatriptan (MAXALT-MLT) 10 MG disintegrating tablet, Take 1 tablet (10 mg total) by mouth as needed for migraine. May repeat  in 2 hours if needed, Disp: 10 tablet, Rfl: 6   fluticasone (CUTIVATE) 0.05 % cream, Apply to affected area twice a day as needed for itchy rash (Patient not taking: Reported on 07/14/2019), Disp: 30 g, Rfl: 1   NARCAN 4 MG/0.1ML LIQD nasal spray kit, USE ONE SPRAY INTRANASAL EVERY TWO TO THREE MINUTES UNTIL EMERGENCY TEAM ARRIVES AS DIRECTED, USE IN OPOID EMERGENCY ONLY (Patient not taking: Reported on 07/14/2019), Disp: , Rfl:   EXAM:  VITALS per patient if applicable: BP 128/78 (BP Location: Left Arm, Patient Position: Sitting, Cuff Size: Large)    Wt 245 lb (111.1 kg)    BMI 43.75 kg/m    GENERAL: alert, oriented, sounds well and in no acute distress No further exam b/c audio visit only.  LABS: none today    Chemistry      Component Value Date/Time   NA 140 01/27/2019 1423   K 3.9 01/27/2019 1423   CL 102 01/27/2019 1423   CO2 29 01/27/2019 1423   BUN 11 01/27/2019 1423   CREATININE 0.73 01/27/2019 1423   CREATININE 0.61 08/07/2017 1508      Component Value Date/Time   CALCIUM 8.9 01/27/2019 1423   ALKPHOS 62 01/27/2019 1423   AST 20 01/27/2019 1423   ALT 15 01/27/2019 1423   BILITOT 0.5 01/27/2019 1423      ASSESSMENT AND PLAN:  Discussed the following assessment and plan:  MDD, recurrent, in remission now on 35m cymbalta  bid. GAD doing well on this med, and she uses clonaz appropriately for increased periods of anxiety and for anxiety-related insomnia. Clonazepam 0.516mbid prn, #180, no RF eRx'd today. RF'd cymbalta 3061mid, #180, RF x 3.  -we discussed possible serious and likely etiologies, options for evaluation and workup, limitations of telemedicine visit vs in person visit, treatment, treatment risks and precautions. Pt prefers to treat via telemedicine empirically rather then risking or undertaking an in person visit at this moment. Patient agrees to seek prompt in person care if worsening, new symptoms arise, or if is not improving with treatment.   I discussed the assessment and treatment plan with the patient. The patient was provided an opportunity to ask questions and all were answered. The patient agreed with the plan and demonstrated an understanding of the instructions.   The patient was advised to call back or seek an in-person evaluation if the symptoms worsen or if the condition fails to improve as anticipated.  F/u: 6 mo CPE  Signed:  PhiCrissie SicklesD           07/14/2019

## 2019-07-20 DIAGNOSIS — M5136 Other intervertebral disc degeneration, lumbar region: Secondary | ICD-10-CM | POA: Diagnosis not present

## 2019-07-20 DIAGNOSIS — M4726 Other spondylosis with radiculopathy, lumbar region: Secondary | ICD-10-CM | POA: Diagnosis not present

## 2019-07-20 DIAGNOSIS — M791 Myalgia, unspecified site: Secondary | ICD-10-CM | POA: Diagnosis not present

## 2019-07-20 DIAGNOSIS — M5106 Intervertebral disc disorders with myelopathy, lumbar region: Secondary | ICD-10-CM | POA: Diagnosis not present

## 2019-07-20 DIAGNOSIS — Z79891 Long term (current) use of opiate analgesic: Secondary | ICD-10-CM | POA: Diagnosis not present

## 2019-07-20 DIAGNOSIS — G894 Chronic pain syndrome: Secondary | ICD-10-CM | POA: Diagnosis not present

## 2019-07-26 ENCOUNTER — Ambulatory Visit: Payer: BC Managed Care – PPO | Attending: Internal Medicine

## 2019-07-26 ENCOUNTER — Other Ambulatory Visit: Payer: Self-pay

## 2019-07-26 DIAGNOSIS — Z23 Encounter for immunization: Secondary | ICD-10-CM

## 2019-07-26 NOTE — Progress Notes (Signed)
   Covid-19 Vaccination Clinic  Name:  Samantha Doyle    MRN: [id number] DOB: 12-Jun-1975  07/26/2019  Samantha Doyle was observed post Covid-19 immunization for 30 minutes based on pre-vaccination screening without incident. She was provided with Vaccine Information Sheet and instruction to access the V-Safe system.   Samantha Doyle was instructed to call 911 with any severe reactions post vaccine: Marland Kitchen Difficulty breathing  . Swelling of face and throat  . A fast heartbeat  . A bad rash all over body  . Dizziness and weakness   Immunizations Administered    Name Date Dose VIS Date Route   Pfizer COVID-19 Vaccine 07/26/2019  1:03 PM 0.3 mL 03/02/2018 Intramuscular   Manufacturer: [employer], [employer]   Lot: [id number]   NDC: [address]-5

## 2019-08-01 ENCOUNTER — Other Ambulatory Visit: Payer: Self-pay

## 2019-08-01 ENCOUNTER — Encounter: Payer: Self-pay | Admitting: Family Medicine

## 2019-08-01 ENCOUNTER — Telehealth: Payer: Self-pay

## 2019-08-01 ENCOUNTER — Telehealth (INDEPENDENT_AMBULATORY_CARE_PROVIDER_SITE_OTHER): Payer: BC Managed Care – PPO | Admitting: Family Medicine

## 2019-08-01 VITALS — Temp 97.6°F

## 2019-08-01 DIAGNOSIS — R062 Wheezing: Secondary | ICD-10-CM | POA: Diagnosis not present

## 2019-08-01 DIAGNOSIS — J209 Acute bronchitis, unspecified: Secondary | ICD-10-CM

## 2019-08-01 NOTE — Progress Notes (Signed)
Virtual Visit via Video Note  I connected with pt on 08/01/19 at  4:00 PM EDT by a video enabled telemedicine application and verified that I am speaking with the correct person using two identifiers.  Location patient: home Location provider:work or home office Persons participating in the virtual visit: patient, provider  I discussed the limitations of evaluation and management by telemedicine and the availability of in person appointments. The patient expressed understanding and agreed to proceed.   HPI: 44 y/o WF being seen today for cough and congestion. Productive cough onset 6 d/a.  Started to feel onset of sx's the day after getting her 1st covid vaccine on 07/26/19. Nasal congestion and runny nose present, has taken her zyrtec, also using albut hfa regularly during this time.  Mild HA consistently last few days.  bp 130/88 today at work.  No fever. Cough gradually worsening.  Also some wheezing and chest tightness/sob.  Albut does help these sx's.  No otc cough med. Prednisolone recently rx'd by her pain mgmt MD for her back has helped some. Started this yesterday '20mg'$ , '16mg'$ , 12 mg, 8 mg 4 mg.  No n/v/d or abd pain, no CP, no ST, no rash.    ROS: See pertinent positives and negatives per HPI.  Past Medical History:  Diagnosis Date  . Allergic rhinitis   . Arthritis   . Carpal tunnel syndrome    right wrist, numb all the time  . Cervical dysplasia   . Chronic back pain    Degenerative lumbar spondylosis with grade 2 spondylolisthesis L4 on L5 with bilater pars defects L4.   Marland Kitchen Chronic bronchitis ([place])    hx of when she was a smoker: ? mild intermittent asthma?--much better since stopped smoking 2018.  . Colon cancer screening 01/2019   GI was unable to get in contact with pt  . DDD (degenerative disc disease), lumbar    MRI 10/2015: grade I (87m) anterolisthesis L4 on L5, w/ disc herniation with encroachment on spinal nerves at L4-S1 levels.  . Depression   . GERD  (gastroesophageal reflux disease)    worsened 07/2018 likely associated with lap band being too constricting->increased pantoprazole to '40mg'$  bid at that time.  .Marland KitchenHistory of fracture of phalanx of finger    left, 4th finger distal phalanx  . History of non anemic vitamin B12 deficiency    s/p lap band surgery per pt report.  . Hyperlipidemia   . Hypertension    off all bp meds x 3-4 years  . Iron deficiency anemia 01/2019   hemoccults x 3 pending, GI referral. GI was unable to get in contact with pt  . LAP-BAND surgery status    Pt getting lap band removed and is getting gastric sleave after.  . Migraine syndrome   . Ovarian cyst 10/2015   LEFT, 4 cm--noted on L spine MRI w/out contrast.  F/u u/s imaging showed simple cyst.  . Spondylarthrosis   . Tobacco dependence in remission    quit 2018    Past Surgical History:  Procedure Laterality Date  . CARPAL TUNNEL RELEASE Left   . CERVICAL CONE BIOPSY    . COLONOSCOPY     2015  . ESOPHAGOGASTRODUODENOSCOPY N/A 04/27/2014   Procedure: ESOPHAGOGASTRODUODENOSCOPY (EGD);  Surgeon: DAlphonsa Overall MD;  [facility];  Service: General;  Laterality: N/A;  . GASTRIC BANDING PORT REVISION N/A 10/02/2014   Procedure: LAPROSCOPIC PLACEMENT OF LAP BAND PORT;  Surgeon: BExcell Seltzer MD;  Location: [facility];  Service: General;  Laterality: N/A;  . LAPAROSCOPIC GASTRIC BANDING  03/2007   [facility] - [facility]; Dr Jorja Loa Hipp  . LAPAROSCOPIC REVISION OF GASTRIC BAND N/A 05/17/2012   Procedure: LAPAROSCOPIC REVISION OF SLIPPED GASTRIC BAND;  Surgeon: Mariella Saa, MD;  Location: [facility];  Service: General;  Laterality: N/A;  . LAPAROSCOPY N/A 04/30/2014   Procedure: DIAGNOSTIC LAPAROSCOPY WITH REMOVAL OF INFECTED LAP BAND PORT WITH DEBRIDEMENT SUBCUTANEOUS ABSCESS;  Surgeon: Gaynelle Adu, MD;  Location: [facility];  Service: General;  Laterality: N/A;  . VARICOSE VEIN SURGERY Bilateral 2009    Family History  Problem Relation  Age of Onset  . Alcohol abuse Mother   . Drug abuse Mother   . Arthritis Mother   . Cancer Mother   . Hyperlipidemia Mother   . Heart disease Mother   . Hypertension Mother   . Non-Hodgkin's lymphoma Mother   . Alcohol abuse Father   . Arthritis Father   . Hyperlipidemia Father   . Heart disease Father   . Stroke Father   . Hypertension Father   . Mental illness Father   . Diabetes Father   . Hyperlipidemia Sister   . Hypertension Sister   . Arthritis Maternal Grandmother   . Cancer Maternal Grandmother   . Hypertension Maternal Grandmother   . Arthritis Maternal Grandfather   . Arthritis Paternal Grandmother   . Cancer Paternal Grandmother   . Hyperlipidemia Paternal Grandmother   . Heart disease Paternal Grandmother   . Hypertension Paternal Grandmother   . Diabetes Paternal Grandmother   . Arthritis Paternal Grandfather   . Hyperlipidemia Paternal Grandfather   . Heart disease Paternal Grandfather   . Stroke Paternal Grandfather   . Hypertension Paternal Grandfather      Current Outpatient Medications:  .  albuterol (VENTOLIN HFA) 108 (90 Base) MCG/ACT inhaler, INHALE 2 PUFFS INTO THE LUNGS EVERY 4 (FOUR) HOURS AS NEEDED FOR WHEEZING OR SHORTNESS OF BREATH., Disp: 8 g, Rfl: 0 .  baclofen (LIORESAL) 10 MG tablet, Take 10 mg by mouth every 8 (eight) hours., Disp: , Rfl:  .  cetirizine (ZYRTEC) 10 MG tablet, Take 20 mg by mouth daily as needed for allergies (allergies). , Disp: , Rfl:  .  clonazePAM (KLONOPIN) 0.5 MG tablet, 1 tab po bid prn, Disp: 180 tablet, Rfl: 0 .  diclofenac (VOLTAREN) 75 MG EC tablet, Take 1 tablet by mouth 2 (two) times daily. , Disp: , Rfl:  .  DULoxetine (CYMBALTA) 30 MG capsule, 1 cap po bid, Disp: 180 capsule, Rfl: 3 .  fluticasone (CUTIVATE) 0.05 % cream, Apply to affected area twice a day as needed for itchy rash (Patient not taking: Reported on 07/14/2019), Disp: 30 g, Rfl: 1 .  gabapentin (NEURONTIN) 800 MG tablet, Take 800 mg by mouth 3  (three) times daily., Disp: , Rfl: 6 .  lisinopril (ZESTRIL) 40 MG tablet, Take 1 tablet (40 mg total) by mouth daily., Disp: 90 tablet, Rfl: 3 .  Multiple Vitamin (MULITIVITAMIN WITH MINERALS) TABS, Take 1 tablet by mouth daily.  , Disp: , Rfl:  .  NARCAN 4 MG/0.1ML LIQD nasal spray kit, USE ONE SPRAY INTRANASAL EVERY TWO TO THREE MINUTES UNTIL EMERGENCY TEAM ARRIVES AS DIRECTED, USE IN OPOID EMERGENCY ONLY (Patient not taking: Reported on 07/14/2019), Disp: , Rfl:  .  oxyCODONE (ROXICODONE) 15 MG immediate release tablet, TAKE ONE TABLET BY MOUTH EVERY 6 HOURS FOR 30 DAYS, Disp: , Rfl:  .  pantoprazole (PROTONIX) 40  MG tablet, TAKE 1 TABLET BY MOUTH TWICE A DAY, Disp: 180 tablet, Rfl: 1 .  rizatriptan (MAXALT-MLT) 10 MG disintegrating tablet, Take 1 tablet (10 mg total) by mouth as needed for migraine. May repeat in 2 hours if needed, Disp: 10 tablet, Rfl: 6  EXAM:  VITALS per patient if applicable: There were no vitals taken for this visit.   GENERAL: alert, oriented, appears well and in no acute distress  HEENT: atraumatic, conjunttiva clear, no obvious abnormalities on inspection of external nose and ears  NECK: normal movements of the head and neck  LUNGS: on inspection no signs of respiratory distress, breathing rate appears normal, no obvious gross SOB, gasping or wheezing  CV: no obvious cyanosis  MS: moves all visible extremities without noticeable abnormality  PSYCH/NEURO: pleasant and cooperative, no obvious depression or anxiety, speech and thought processing grossly intact  ASSESSMENT AND PLAN:  Discussed the following assessment and plan:  Acute asthmatic bronchitis. Viral etiology suspected.  Suspect covid 19 vaccine had nothing to do with this. Continue medrol dose pack (she is on day 2 today).  Continue albuterol hfa q4h prn. Start mucinex dm q12h prn. Continue zyrtec.  Add saline nasal spray prn and get cool mist humidifier.  -we discussed possible serious and  likely etiologies, options for evaluation and workup, limitations of telemedicine visit vs in person visit, treatment, treatment risks and precautions. Pt prefers to treat via telemedicine empirically rather then risking or undertaking an in person visit at this moment. Patient agrees to seek prompt in person care if worsening, new symptoms arise, or if is not improving with treatment.   I discussed the assessment and treatment plan with the patient. The patient was provided an opportunity to ask questions and all were answered. The patient agreed with the plan and demonstrated an understanding of the instructions.   The patient was advised to call back or seek an in-person evaluation if the symptoms worsen or if the condition fails to improve as anticipated.  F/u:  If not signif impr in 3-4 d she'll call, send mychart message, or make f/u appt  Signed:  Crissie Sickles, MD           08/01/2019

## 2019-08-01 NOTE — Telephone Encounter (Signed)
Error - no documentation needed - closing encounter

## 2019-08-09 ENCOUNTER — Other Ambulatory Visit: Payer: Self-pay | Admitting: Family Medicine

## 2019-08-09 ENCOUNTER — Other Ambulatory Visit: Payer: Self-pay

## 2019-08-09 MED ORDER — LISINOPRIL 40 MG PO TABS: 40.0000 mg | ORAL_TABLET | Freq: Every day | ORAL | 1 refills | Status: DC

## 2019-08-16 ENCOUNTER — Ambulatory Visit: Payer: BC Managed Care – PPO | Attending: Internal Medicine

## 2019-08-16 DIAGNOSIS — Z23 Encounter for immunization: Secondary | ICD-10-CM

## 2019-08-16 NOTE — Progress Notes (Signed)
   Covid-19 Vaccination Clinic  Name:  Samantha Doyle    MRN: [id number] DOB: 1975/09/11  08/16/2019  [name] was observed post Covid-19 immunization for 15 minutes without incident. She was provided with Vaccine Information Sheet and instruction to access the V-Safe system.   [name] was instructed to call 911 with any severe reactions post vaccine: Marland Kitchen Difficulty breathing  . Swelling of face and throat  . A fast heartbeat  . A bad rash all over body  . Dizziness and weakness   Immunizations Administered    Name Date Dose VIS Date Route   Pfizer COVID-19 Vaccine 08/16/2019  1:21 PM 0.3 mL 03/02/2018 Intramuscular   Manufacturer: [employer], [employer]   Lot: [id number]   NDC: [address]-1

## 2019-08-23 ENCOUNTER — Ambulatory Visit: Payer: Self-pay

## 2019-09-13 ENCOUNTER — Other Ambulatory Visit: Payer: Self-pay | Admitting: Family Medicine

## 2019-09-26 DIAGNOSIS — M961 Postlaminectomy syndrome, not elsewhere classified: Secondary | ICD-10-CM | POA: Diagnosis not present

## 2019-09-26 DIAGNOSIS — G894 Chronic pain syndrome: Secondary | ICD-10-CM | POA: Diagnosis not present

## 2019-09-26 DIAGNOSIS — M791 Myalgia, unspecified site: Secondary | ICD-10-CM | POA: Diagnosis not present

## 2019-09-26 DIAGNOSIS — Z79891 Long term (current) use of opiate analgesic: Secondary | ICD-10-CM | POA: Diagnosis not present

## 2019-09-26 DIAGNOSIS — M5106 Intervertebral disc disorders with myelopathy, lumbar region: Secondary | ICD-10-CM | POA: Diagnosis not present

## 2019-09-26 DIAGNOSIS — M4726 Other spondylosis with radiculopathy, lumbar region: Secondary | ICD-10-CM | POA: Diagnosis not present

## 2019-09-26 DIAGNOSIS — M5136 Other intervertebral disc degeneration, lumbar region: Secondary | ICD-10-CM | POA: Diagnosis not present

## 2019-11-07 ENCOUNTER — Other Ambulatory Visit: Payer: Self-pay | Admitting: Family Medicine

## 2019-11-08 NOTE — Telephone Encounter (Signed)
RF request for pantoprazole LOV:08/01/19, acute and 07/14/19 [facility] Next ov: F/u: 6 mo CPE Last written:05/18/19(180,1)  Requesting: clonazepam Contract: 02/18/18 UDS: n/a Last Visit:7/26/2, acute and 07/14/19 [facility] Next Visit:F/u: 6 mo CPE Last Refill: 07/14/19(180,0)  Please Advise. Medications pending

## 2019-12-05 ENCOUNTER — Encounter: Payer: Self-pay | Admitting: Family Medicine

## 2019-12-05 ENCOUNTER — Telehealth: Payer: Self-pay

## 2019-12-05 ENCOUNTER — Telehealth (INDEPENDENT_AMBULATORY_CARE_PROVIDER_SITE_OTHER): Payer: BC Managed Care – PPO | Admitting: Family Medicine

## 2019-12-05 DIAGNOSIS — J0101 Acute recurrent maxillary sinusitis: Secondary | ICD-10-CM | POA: Diagnosis not present

## 2019-12-05 MED ORDER — AMOXICILLIN-POT CLAVULANATE 875-125 MG PO TABS: 1.0000 | ORAL_TABLET | Freq: Two times a day (BID) | ORAL | 0 refills | Status: DC

## 2019-12-05 NOTE — Progress Notes (Signed)
Virtual Visit via Video Note  I connected with pt on 12/05/19 at  4:00 PM EST by a video enabled telemedicine application and verified that I am speaking with the correct person using two identifiers.  Location patient: home, [place] Location provider:work or home office Persons participating in the virtual visit: patient, provider  I discussed the limitations of evaluation and management by telemedicine and the availability of in person appointments. The patient expressed understanding and agreed to proceed.  Telemedicine visit is a necessity given the COVID-19 restrictions in place at the current time.  HPI: 44 y/o WF being seen today for resp sx's. Recent HA last week, persisted, then some R upper teeth pain, sinus/facial swelling on that side.  No nasal congestion and no nasal mucous, just her chronic nasal dryness.   No recent teeth on that side with dental probs.  No swelling of gums around teeth at all/no oral swelling. Taking mucinex and antihistamine. No cough, ST, or fevers.  ROS: See pertinent positives and negatives per HPI.  Past Medical History:  Diagnosis Date  . Allergic rhinitis   . Arthritis   . Carpal tunnel syndrome    right wrist, numb all the time  . Cervical dysplasia   . Chronic back pain    Degenerative lumbar spondylosis with grade 2 spondylolisthesis L4 on L5 with bilater pars defects L4.   Marland Kitchen Chronic bronchitis ([place])    hx of when she was a smoker: ? mild intermittent asthma?--much better since stopped smoking 2018.  . Colon cancer screening 01/2019   GI was unable to get in contact with pt  . DDD (degenerative disc disease), lumbar    MRI 10/2015: grade I (47mm) anterolisthesis L4 on L5, w/ disc herniation with encroachment on spinal nerves at L4-S1 levels.  . Depression   . GERD (gastroesophageal reflux disease)    worsened 07/2018 likely associated with lap band being too constricting->increased pantoprazole to $RemoveBeforeD'40mg'[id number]$  bid at that time.  Marland Kitchen History of fracture  of phalanx of finger    left, 4th finger distal phalanx  . History of non anemic vitamin B12 deficiency    s/p lap band surgery per pt report.  . Hyperlipidemia   . Hypertension    off all bp meds x 3-4 years  . Iron deficiency anemia 01/2019   hemoccults x 3 pending, GI referral. GI was unable to get in contact with pt  . LAP-BAND surgery status    Pt getting lap band removed and is getting gastric sleave after.  . Migraine syndrome   . Ovarian cyst 10/2015   LEFT, 4 cm--noted on L spine MRI w/out contrast.  F/u u/s imaging showed simple cyst.  . Spondylarthrosis   . Tobacco dependence in remission    quit 2018    Past Surgical History:  Procedure Laterality Date  . CARPAL TUNNEL RELEASE Left   . CERVICAL CONE BIOPSY    . COLONOSCOPY     2015  . ESOPHAGOGASTRODUODENOSCOPY N/A 04/27/2014   Procedure: ESOPHAGOGASTRODUODENOSCOPY (EGD);  Surgeon: Alphonsa Overall, MD;  Location: [facility];  Service: General;  Laterality: N/A;  . GASTRIC BANDING PORT REVISION N/A 10/02/2014   Procedure: LAPROSCOPIC PLACEMENT OF LAP BAND PORT;  Surgeon: Excell Seltzer, MD;  Location: [facility];  Service: General;  Laterality: N/A;  . LAPAROSCOPIC GASTRIC BANDING  03/2007   [facility]; Dr Octavia Bruckner Hipp  . LAPAROSCOPIC REVISION OF GASTRIC BAND N/A 05/17/2012   Procedure: LAPAROSCOPIC REVISION OF SLIPPED GASTRIC BAND;  Surgeon: Edward Jolly, MD;  Location: [facility];  Service: General;  Laterality: N/A;  . LAPAROSCOPY N/A 04/30/2014   Procedure: DIAGNOSTIC LAPAROSCOPY WITH REMOVAL OF INFECTED LAP BAND PORT WITH DEBRIDEMENT SUBCUTANEOUS ABSCESS;  Surgeon: Greer Pickerel, MD;  Location: [facility];  Service: General;  Laterality: N/A;  . VARICOSE VEIN SURGERY Bilateral 2009     Current Outpatient Medications:  .  albuterol (VENTOLIN HFA) 108 (90 Base) MCG/ACT inhaler, INHALE 2 PUFFS INTO THE LUNGS EVERY 4 (FOUR) HOURS AS NEEDED FOR WHEEZING OR SHORTNESS OF BREATH., Disp: 8 g, Rfl: 0 .   baclofen (LIORESAL) 10 MG tablet, Take 10 mg by mouth every 8 (eight) hours., Disp: , Rfl:  .  cetirizine (ZYRTEC) 10 MG tablet, Take 20 mg by mouth daily as needed for allergies (allergies). , Disp: , Rfl:  .  clonazePAM (KLONOPIN) 0.5 MG tablet, TAKE 1 TABLET BY MOUTH TWICE A DAY AS NEEDED, Disp: 180 tablet, Rfl: 0 .  diclofenac (VOLTAREN) 75 MG EC tablet, Take 1 tablet by mouth 2 (two) times daily. , Disp: , Rfl:  .  DULoxetine (CYMBALTA) 30 MG capsule, 1 cap po bid, Disp: 180 capsule, Rfl: 3 .  fluticasone (CUTIVATE) 0.05 % cream, Apply to affected area twice a day as needed for itchy rash (Patient not taking: Reported on 07/14/2019), Disp: 30 g, Rfl: 1 .  gabapentin (NEURONTIN) 800 MG tablet, Take 800 mg by mouth 3 (three) times daily., Disp: , Rfl: 6 .  lisinopril (ZESTRIL) 40 MG tablet, Take 1 tablet (40 mg total) by mouth daily., Disp: 90 tablet, Rfl: 1 .  methylPREDNISolone (MEDROL DOSEPAK) 4 MG TBPK tablet, Take by mouth as directed., Disp: , Rfl:  .  Multiple Vitamin (MULITIVITAMIN WITH MINERALS) TABS, Take 1 tablet by mouth daily.  , Disp: , Rfl:  .  NARCAN 4 MG/0.1ML LIQD nasal spray kit, USE ONE SPRAY INTRANASAL EVERY TWO TO THREE MINUTES UNTIL EMERGENCY TEAM ARRIVES AS DIRECTED, USE IN OPOID EMERGENCY ONLY (Patient not taking: Reported on 07/14/2019), Disp: , Rfl:  .  oxyCODONE (ROXICODONE) 15 MG immediate release tablet, TAKE ONE TABLET BY MOUTH EVERY 6 HOURS FOR 30 DAYS, Disp: , Rfl:  .  pantoprazole (PROTONIX) 40 MG tablet, TAKE 1 TABLET BY MOUTH TWICE A DAY, Disp: 180 tablet, Rfl: 1 .  rizatriptan (MAXALT-MLT) 10 MG disintegrating tablet, Take 1 tablet (10 mg total) by mouth as needed for migraine. May repeat in 2 hours if needed, Disp: 10 tablet, Rfl: 6  EXAM:  VITALS per patient if applicable:  Vitals with BMI 07/14/2019 01/27/2019 12/13/2018  Height - 5' 2.75" -  Weight 245 lbs 264 lbs 10 oz -  BMI - 84.66 -  Systolic 599 357 017  Diastolic 89 85 85  Pulse - 62 67    GENERAL:  alert, oriented, appears well and in no acute distress  HEENT: atraumatic, conjunttiva clear, no obvious abnormalities on inspection of external nose and ears  NECK: normal movements of the head and neck  LUNGS: on inspection no signs of respiratory distress, breathing rate appears normal, no obvious gross SOB, gasping or wheezing  CV: no obvious cyanosis  MS: moves all visible extremities without noticeable abnormality  PSYCH/NEURO: pleasant and cooperative, no obvious depression or anxiety, speech and thought processing grossly intact  LABS: none today    Chemistry      Component Value Date/Time   NA 140 01/27/2019 1423   K 3.9 01/27/2019 1423   CL 102 01/27/2019 1423  CO2 29 01/27/2019 1423   BUN 11 01/27/2019 1423   CREATININE 0.73 01/27/2019 1423   CREATININE 0.61 08/07/2017 1508      Component Value Date/Time   CALCIUM 8.9 01/27/2019 1423   ALKPHOS 62 01/27/2019 1423   AST 20 01/27/2019 1423   ALT 15 01/27/2019 1423   BILITOT 0.5 01/27/2019 1423     Lab Results  Component Value Date   WBC 5.5 01/27/2019   HGB 9.6 Repeated and verified X2. (L) 01/27/2019   HCT 32.0 (L) 01/27/2019   MCV 71.0 (L) 01/27/2019   PLT 226.0 01/27/2019    ASSESSMENT AND PLAN:  Discussed the following assessment and plan:  Acute sinusitis, similar to her past sinus infections. Augmentin 876m, 1 bid x 10d.  -we discussed possible serious and likely etiologies, options for evaluation and workup, limitations of telemedicine visit vs in person visit, treatment, treatment risks and precautions. Pt prefers to treat via telemedicine empirically rather than in person at this moment.    I discussed the assessment and treatment plan with the patient. The patient was provided an opportunity to ask questions and all were answered. The patient agreed with the plan and demonstrated an understanding of the instructions.   F/u: prn  Signed:  PCrissie Sickles MD           12/05/2019

## 2019-12-05 NOTE — Telephone Encounter (Signed)
Continue 1 tab twice daily as needed

## 2019-12-05 NOTE — Telephone Encounter (Signed)
Patient had VV today with PCP and prescribed augmentin 875-125mg  (20,0). She is currently taking Mucinex DM as well and wanted to know if she needed to continue taking 1 tab or ok to take 2 tabs.  Please advise, thanks.

## 2019-12-06 NOTE — Telephone Encounter (Signed)
Patient advised.

## 2019-12-12 DIAGNOSIS — M4726 Other spondylosis with radiculopathy, lumbar region: Secondary | ICD-10-CM | POA: Diagnosis not present

## 2019-12-12 DIAGNOSIS — Z79891 Long term (current) use of opiate analgesic: Secondary | ICD-10-CM | POA: Diagnosis not present

## 2019-12-12 DIAGNOSIS — M5136 Other intervertebral disc degeneration, lumbar region: Secondary | ICD-10-CM | POA: Diagnosis not present

## 2019-12-12 DIAGNOSIS — M5106 Intervertebral disc disorders with myelopathy, lumbar region: Secondary | ICD-10-CM | POA: Diagnosis not present

## 2019-12-12 DIAGNOSIS — G894 Chronic pain syndrome: Secondary | ICD-10-CM | POA: Diagnosis not present

## 2019-12-12 DIAGNOSIS — M791 Myalgia, unspecified site: Secondary | ICD-10-CM | POA: Diagnosis not present

## 2019-12-12 DIAGNOSIS — M961 Postlaminectomy syndrome, not elsewhere classified: Secondary | ICD-10-CM | POA: Diagnosis not present

## 2019-12-30 ENCOUNTER — Other Ambulatory Visit: Payer: Self-pay | Admitting: Family Medicine

## 2020-01-02 ENCOUNTER — Telehealth: Payer: Self-pay | Admitting: Family Medicine

## 2020-01-02 NOTE — Telephone Encounter (Signed)
Patient asking for refill of Lisinopril. Scheduled physical for 02/02/2020.

## 2020-01-03 ENCOUNTER — Other Ambulatory Visit: Payer: Self-pay

## 2020-01-03 MED ORDER — LISINOPRIL 40 MG PO TABS
40.0000 mg | ORAL_TABLET | Freq: Every day | ORAL | 0 refills | Status: DC
Start: 2020-01-03 — End: 2020-02-02

## 2020-01-03 NOTE — Telephone Encounter (Signed)
Rx sent for 30 day supply, patient notified.

## 2020-02-01 ENCOUNTER — Other Ambulatory Visit: Payer: Self-pay

## 2020-02-02 ENCOUNTER — Encounter: Payer: Self-pay | Admitting: Family Medicine

## 2020-02-02 ENCOUNTER — Ambulatory Visit (INDEPENDENT_AMBULATORY_CARE_PROVIDER_SITE_OTHER): Payer: BC Managed Care – PPO | Admitting: Family Medicine

## 2020-02-02 VITALS — BP 117/74 | HR 64 | Temp 97.8°F | Resp 16 | Ht 62.75 in | Wt 234.0 lb

## 2020-02-02 DIAGNOSIS — Z124 Encounter for screening for malignant neoplasm of cervix: Secondary | ICD-10-CM

## 2020-02-02 DIAGNOSIS — D509 Iron deficiency anemia, unspecified: Secondary | ICD-10-CM

## 2020-02-02 DIAGNOSIS — T887XXA Unspecified adverse effect of drug or medicament, initial encounter: Secondary | ICD-10-CM

## 2020-02-02 DIAGNOSIS — F339 Major depressive disorder, recurrent, unspecified: Secondary | ICD-10-CM

## 2020-02-02 DIAGNOSIS — Z1231 Encounter for screening mammogram for malignant neoplasm of breast: Secondary | ICD-10-CM

## 2020-02-02 DIAGNOSIS — Z0000 Encounter for general adult medical examination without abnormal findings: Secondary | ICD-10-CM

## 2020-02-02 DIAGNOSIS — I10 Essential (primary) hypertension: Secondary | ICD-10-CM

## 2020-02-02 DIAGNOSIS — F411 Generalized anxiety disorder: Secondary | ICD-10-CM

## 2020-02-02 MED ORDER — DULOXETINE HCL 30 MG PO CPEP: ORAL_CAPSULE | ORAL | 3 refills | Status: DC

## 2020-02-02 MED ORDER — LISINOPRIL 40 MG PO TABS: 40.0000 mg | ORAL_TABLET | Freq: Every day | ORAL | 3 refills | Status: DC

## 2020-02-02 NOTE — Progress Notes (Signed)
See student note from this date. Signed:  Phil McGowen, MD           02/02/2020  

## 2020-02-02 NOTE — Progress Notes (Signed)
CC: annual health maintenance exam and f/u IDA  HPI:  Samantha Doyle is a 45 yo female who presents to the clinic today for annual heath maintenance exam and f/u iron deficiency anemia.  Patient found to have iron deficiency anemia at last office visit, 1 year ago. Patient with history of laparoscopic gastric band having problems eating secondary to fullness. Patient reports that she has been trying to eat more iron rich foods (e.g. red meats and spinach) but has not been able to as she feels the gastric band is limiting her eating too much. GI surgery responsible for procedure are aware and patient in process of going through steps to be evaluated for revision/correction to improve dietary symptoms.  Additionally, at last visit, patient was given hemmocult stool tests, but did not complete. Patient not currently taking supplemental iron, but as mentioned has been trying to increase dietary load of iron. Patient reports regular menses that are 5 days in length with heaviest day of bleeding requiring 4 tampons. Patient denies melena or hematochezia. Patient reports tiredness and fatigue.  Menses occur monthly/regular, 4-5 d duration, NOT heavy.  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 She has lost ~30 lbs in the past year, which she attributes to not being able to eat appropriately 2/2 Lap gastric band.  Anxiety stable, not taking as much clonaz as usual last 4-6 wks b/c has been taking baclofen rx'd by her back doctor for back problems lately. Of note, she wants to cut her cymbalta back to 66m once a day b/c says short term memory probs started after we increased her dose to 30 bid back in July 2021. PMP AWARE reviewed today: most recent rx for clonaz was filled 11/08/19, # 180, rx by me. No red flags.    PMH: Past Medical History:  Diagnosis Date  . Allergic rhinitis   . Arthritis   . Carpal tunnel syndrome    right wrist, numb all the time  . Cervical  dysplasia   . Chronic back pain    Degenerative lumbar spondylosis with grade 2 spondylolisthesis L4 on L5 with bilater pars defects L4.   .Marland KitchenChronic bronchitis ([place]    hx of when she was a smoker: ? mild intermittent asthma?--much better since stopped smoking 2018.  . Colon cancer screening 01/2019   GI was unable to get in contact with pt  . DDD (degenerative disc disease), lumbar    MRI 10/2015: grade I (751m anterolisthesis L4 on L5, w/ disc herniation with encroachment on spinal nerves at L4-S1 levels.  . Depression   . GERD (gastroesophageal reflux disease)    worsened 07/2018 likely associated with lap band being too constricting->increased pantoprazole to 4062mid at that time.  . HMarland Kitchenstory of fracture of phalanx of finger    left, 4th finger distal phalanx  . History of non anemic vitamin B12 deficiency    s/p lap band surgery per pt report.  . Hyperlipidemia   . Hypertension    off all bp meds x 3-4 years  . Iron deficiency anemia 01/2019   hemoccults x 3 pending, GI referral. GI was unable to get in contact with pt  . LAP-BAND surgery status    Pt getting lap band removed and is getting gastric sleave after.  . Migraine syndrome   . Ovarian cyst 10/2015   LEFT, 4 cm--noted on L spine MRI w/out contrast.  F/u u/s imaging showed simple cyst.  . Spondylarthrosis   .  Tobacco dependence in remission    quit 2018   Social History   Socioeconomic History  . Marital status: Single    Spouse name: Not on file  . Number of children: Not on file  . Years of education: Not on file  . Highest education level: Not on file  Occupational History  . Not on file  Tobacco Use  . Smoking status: Former Smoker    Packs/day: 0.25    Years: 5.00    Pack years: 1.25    Types: Cigarettes  . Smokeless tobacco: Never Used  Substance and Sexual Activity  . Alcohol use: No  . Drug use: No  . Sexual activity: Yes    Birth control/protection: None  Other Topics Concern  . Not on file   Social History Narrative   Single, no children.   Occup: Electrical engineer   Tobacco: 6 pack-yr hx--quit with wellbutrin.   No alcohol or drugs.   Social Determinants of Health   Financial Resource Strain: Not on file  Food Insecurity: Not on file  Transportation Needs: Not on file  Physical Activity: Not on file  Stress: Not on file  Social Connections: Not on file   Family History  Problem Relation Age of Onset  . Alcohol abuse Mother   . Drug abuse Mother   . Arthritis Mother   . Hyperlipidemia Mother   . Heart disease Mother   . Hypertension Mother   . Non-Hodgkin's lymphoma Mother   . Lung cancer Mother   . Alcohol abuse Father   . Arthritis Father   . Hyperlipidemia Father   . Heart disease Father   . Stroke Father   . Hypertension Father   . Mental illness Father   . Diabetes Father   . Hyperlipidemia Sister   . Hypertension Sister   . Arthritis Maternal Grandmother   . Cancer Maternal Grandmother   . Hypertension Maternal Grandmother   . Arthritis Maternal Grandfather   . Arthritis Paternal Grandmother   . Cancer Paternal Grandmother   . Hyperlipidemia Paternal Grandmother   . Heart disease Paternal Grandmother   . Hypertension Paternal Grandmother   . Diabetes Paternal Grandmother   . Arthritis Paternal Grandfather   . Hyperlipidemia Paternal Grandfather   . Heart disease Paternal Grandfather   . Stroke Paternal Grandfather   . Hypertension Paternal Grandfather     M/A: Current Outpatient Medications on File Prior to Visit  Medication Sig Dispense Refill  . albuterol (VENTOLIN HFA) 108 (90 Base) MCG/ACT inhaler INHALE 2 PUFFS INTO THE LUNGS EVERY 4 (FOUR) HOURS AS NEEDED FOR WHEEZING OR SHORTNESS OF BREATH. 8 g 0  . amoxicillin-clavulanate (AUGMENTIN) 875-125 MG tablet Take 1 tablet by mouth 2 (two) times daily. 20 tablet 0  . baclofen (LIORESAL) 10 MG tablet Take 10 mg by mouth every 8 (eight) hours.    . cetirizine (ZYRTEC) 10 MG tablet Take 20  mg by mouth daily as needed for allergies (allergies).     . clonazePAM (KLONOPIN) 0.5 MG tablet TAKE 1 TABLET BY MOUTH TWICE A DAY AS NEEDED (Patient not taking: Reported on 12/05/2019) 180 tablet 0  . diclofenac (VOLTAREN) 75 MG EC tablet Take 1 tablet by mouth 2 (two) times daily.     . DULoxetine (CYMBALTA) 30 MG capsule 1 cap po bid 180 capsule 3  . fluticasone (CUTIVATE) 0.05 % cream Apply to affected area twice a day as needed for itchy rash (Patient not taking: Reported on 07/14/2019) 30 g 1  .  gabapentin (NEURONTIN) 800 MG tablet Take 800 mg by mouth 3 (three) times daily.  6  . lisinopril (ZESTRIL) 40 MG tablet Take 1 tablet (40 mg total) by mouth daily. 30 tablet 0  . Multiple Vitamin (MULITIVITAMIN WITH MINERALS) TABS Take 1 tablet by mouth daily.      Marland Kitchen NARCAN 4 MG/0.1ML LIQD nasal spray kit USE ONE SPRAY INTRANASAL EVERY TWO TO THREE MINUTES UNTIL EMERGENCY TEAM ARRIVES AS DIRECTED, USE IN OPOID EMERGENCY ONLY (Patient not taking: Reported on 07/14/2019)    . oxyCODONE (ROXICODONE) 15 MG immediate release tablet TAKE ONE TABLET BY MOUTH EVERY 6 HOURS FOR 30 DAYS    . pantoprazole (PROTONIX) 40 MG tablet TAKE 1 TABLET BY MOUTH TWICE A DAY 180 tablet 1  . rizatriptan (MAXALT-MLT) 10 MG disintegrating tablet Take 1 tablet (10 mg total) by mouth as needed for migraine. May repeat in 2 hours if needed 10 tablet 6   No current facility-administered medications on file prior to visit.   Allergies  Allergen Reactions  . Vancomycin Itching and Anaphylaxis    Face/back warm and red.  Pt lips and tongue swelled, hives  . Demerol Hives and Nausea And Vomiting  . Demerol  [Meperidine Hcl] Other (See Comments)  . Meperidine Nausea And Vomiting and Hives  . Microplegia Msa-Msg  [Plegisol]     Other reaction(s): headache  . Monosodium Glutamate Nausea And Vomiting and Other (See Comments)    migraines  . Tape Rash    Plastic tape      SH:   ROS: Review of Systems  Constitutional:  Positive for fatigue. Negative for appetite change, chills and fever.  HENT: Negative for congestion, dental problem, ear pain and sore throat.   Eyes: Negative for discharge, redness and visual disturbance.  Respiratory: Negative for cough, chest tightness, shortness of breath and wheezing.   Cardiovascular: Negative for chest pain, palpitations and leg swelling.  Gastrointestinal: Positive for abdominal pain (discomfort in upper abd when eating). Negative for blood in stool, diarrhea, nausea and vomiting.  Genitourinary: Negative for difficulty urinating, dysuria, flank pain, frequency, hematuria and urgency.  Musculoskeletal: Negative for arthralgias, back pain, joint swelling, myalgias and neck stiffness.  Skin: Negative for pallor and rash.  Neurological: Negative for dizziness, speech difficulty, weakness and headaches.  Hematological: Negative for adenopathy. Does not bruise/bleed easily.  Psychiatric/Behavioral: Negative for confusion and sleep disturbance. The patient is not nervous/anxious.     PE: Vitals with BMI 07/14/2019 01/27/2019 12/13/2018  Height - 5' 2.75" -  Weight 245 lbs 264 lbs 10 oz -  BMI - 32.35 -  Systolic 573 220 254  Diastolic 89 85 85  Pulse - 62 67    Physical Exam Constitutional:      General: She is not in acute distress.    Comments: Overall, appears pale.  HENT:     Head: Normocephalic and atraumatic.     Right Ear: Tympanic membrane, ear canal and external ear normal.     Left Ear: Tympanic membrane, ear canal and external ear normal.     Nose: Nose normal.     Mouth/Throat:     Mouth: Mucous membranes are moist.     Pharynx: Oropharynx is clear.  Eyes:     Extraocular Movements: Extraocular movements intact.     Pupils: Pupils are equal, round, and reactive to light.  Cardiovascular:     Rate and Rhythm: Normal rate and regular rhythm.     Heart sounds: Normal heart sounds.  No murmur heard. No friction rub. No gallop.   Pulmonary:      Effort: Pulmonary effort is normal.     Breath sounds: Normal breath sounds.  Abdominal:     General: Bowel sounds are normal.     Palpations: Abdomen is soft.  Musculoskeletal:     Right lower leg: No edema.     Left lower leg: No edema.  Skin:    General: Skin is warm.  Neurological:     General: No focal deficit present.     Mental Status: She is alert.  Psychiatric:        Mood and Affect: Mood normal.     Labs:   Chemistry      Component Value Date/Time   NA 140 01/27/2019 1423   K 3.9 01/27/2019 1423   CL 102 01/27/2019 1423   CO2 29 01/27/2019 1423   BUN 11 01/27/2019 1423   CREATININE 0.73 01/27/2019 1423   CREATININE 0.61 08/07/2017 1508      Component Value Date/Time   CALCIUM 8.9 01/27/2019 1423   ALKPHOS 62 01/27/2019 1423   AST 20 01/27/2019 1423   ALT 15 01/27/2019 1423   BILITOT 0.5 01/27/2019 1423     Lab Results  Component Value Date   WBC 5.5 01/27/2019   HGB 9.6 Repeated and verified X2. (L) 01/27/2019   HCT 32.0 (L) 01/27/2019   MCV 71.0 (L) 01/27/2019   PLT 226.0 01/27/2019   Lab Results  Component Value Date   TSH 2.36 01/27/2019   Lab Results  Component Value Date   CHOL 184 01/27/2019   HDL 45.60 01/27/2019   LDLCALC 122 (H) 01/27/2019   TRIG 81.0 01/27/2019   CHOLHDL 4 01/27/2019   Lab Results  Component Value Date   IRON 19 (L) 01/31/2019   FERRITIN 11 (L) 01/31/2019    A/P: In summary, Samantha Doyle is a 45 y.o. year old female with a past surgical history of laparoscopic gastric band who presents for annual health maintenance exam and f/u iron deficiency anemia.  1) Iron Deficiency Anemia: Patient found to have iron deficiency anemia 1 yr ago. Currently, patient not taking iron supplementation. Patient has tried to increase dietary iron load without success 2/2 lap gastric band. Today, patient appears grossly pale on exam. Physical exam otherwise unremarkable. Patient open to iron supplementation today. Etiology likely  nutritional 2/2 lap gastric band impacting eating, but this is diagnosis of exclusion. Patient having regular menses with mild/normal menstrual blood loss, likely not the cause of patients anemia. Will need to get fecal hemoccult cards x 3 to rule out possible GI cause of IDA. Patient also due for colonoscopy which may reveal potential cause of iron deficiency. - CBC, Iron studies today. - Hemoccult Cards x 3.  2) MDD/anxiety: Anxiety and depression managed well. Patient would like to decrease cymbalta to qd instead of the current BID dosing. Patient states her memory has decreased since taking BID. - Decrease cymbalta to one 30 mg capsule qd. Cont clonaz 0.68m bid prn.  No new rx needed today.  3) Health Maintenance Exam: We reviewed age and gender appropriate health maintenance issues (prudent diet, regular exercise, health risks of tobacco and excessive alcohol, use of seatbelts, fire alarms in home, use of sunscreen). Also reviewed age and gender appropriate health screening as well as vaccine recommendations.  Labs: check a CBC, CMP, TSH, and FLP. Vaccines: Tdap UTD. Colon ca screening:  refer to GI today for colonoscopy. Breast  cancer screening: patient would like mammogram. Ordered today. Cervical cancer screening: last pap appears to be 2016. Referral to GYN today today per pt's preference.  Follow Up:  1 month (telehealth) for iron deficiency and MDD/anxiety.  Signed: Nanetta Batty, MS3  I personally was present during the history, physical exam, and medical decision-making activities of this service and have verified that the service and findings are accurately documented in the student's note. Signed:  Crissie Sickles, MD           02/02/2020

## 2020-02-03 LAB — COMPREHENSIVE METABOLIC PANEL
ALT: 13 U/L (ref 0–35)
AST: 15 U/L (ref 0–37)
Albumin: 3.7 g/dL (ref 3.5–5.2)
Alkaline Phosphatase: 60 U/L (ref 39–117)
BUN: 9 mg/dL (ref 6–23)
CO2: 32 mEq/L (ref 19–32)
Calcium: 9.5 mg/dL (ref 8.4–10.5)
Chloride: 103 mEq/L (ref 96–112)
Creatinine, Ser: 0.72 mg/dL (ref 0.40–1.20)
GFR: 100.57 mL/min (ref >60.00)
Glucose, Bld: 79 mg/dL (ref 70–99)
Potassium: 4 mEq/L (ref 3.5–5.1)
Sodium: 140 mEq/L (ref 135–145)
Total Bilirubin: 0.5 mg/dL (ref 0.2–1.2)
Total Protein: 6.5 g/dL (ref 6.0–8.3)

## 2020-02-03 LAB — IRON,TIBC AND FERRITIN PANEL
%SAT: 4 % (calc) — ABNORMAL LOW (ref 16–45)
Ferritin: 5 ng/mL — ABNORMAL LOW (ref 16–232)
Iron: 14 ug/dL — ABNORMAL LOW (ref 40–190)
TIBC: 381 mcg/dL (calc) (ref 250–450)

## 2020-02-03 LAB — CBC WITH DIFFERENTIAL/PLATELET
Basophils Absolute: 0.1 10*3/uL (ref 0.0–0.1)
Basophils Relative: 0.9 % (ref 0.0–3.0)
Eosinophils Absolute: 0.1 10*3/uL (ref 0.0–0.7)
Eosinophils Relative: 2.1 % (ref 0.0–5.0)
HCT: 28.1 % — ABNORMAL LOW (ref 36.0–46.0)
Hemoglobin: 8.4 g/dL — ABNORMAL LOW (ref 12.0–15.0)
Lymphocytes Relative: 14.2 % (ref 12.0–46.0)
Lymphs Abs: 1 10*3/uL (ref 0.7–4.0)
MCHC: 30 g/dL (ref 30.0–36.0)
MCV: 67.5 fl — ABNORMAL LOW (ref 78.0–100.0)
Monocytes Absolute: 0.4 10*3/uL (ref 0.1–1.0)
Monocytes Relative: 6.2 % (ref 3.0–12.0)
Neutro Abs: 5.2 10*3/uL (ref 1.4–7.7)
Neutrophils Relative %: 76.6 % (ref 43.0–77.0)
Platelets: 284 10*3/uL (ref 150.0–400.0)
RBC: 4.16 Mil/uL (ref 3.87–5.11)
RDW: 19.1 % — ABNORMAL HIGH (ref 11.5–15.5)
WBC: 6.8 10*3/uL (ref 4.0–10.5)

## 2020-02-03 LAB — LIPID PANEL
Cholesterol: 189 mg/dL (ref 0–200)
HDL: 53.7 mg/dL (ref >39.00)
LDL Cholesterol: 119 mg/dL — ABNORMAL HIGH (ref 0–99)
NonHDL: 135.38
Total CHOL/HDL Ratio: 4
Triglycerides: 80 mg/dL (ref 0.0–149.0)
VLDL: 16 mg/dL (ref 0.0–40.0)

## 2020-02-03 LAB — TSH: TSH: 1.86 u[IU]/mL (ref 0.35–4.50)

## 2020-02-04 DIAGNOSIS — Z20822 Contact with and (suspected) exposure to covid-19: Secondary | ICD-10-CM | POA: Diagnosis not present

## 2020-02-06 ENCOUNTER — Encounter: Payer: Self-pay | Admitting: Family Medicine

## 2020-02-06 ENCOUNTER — Other Ambulatory Visit: Payer: Self-pay

## 2020-02-06 ENCOUNTER — Telehealth (INDEPENDENT_AMBULATORY_CARE_PROVIDER_SITE_OTHER): Payer: BC Managed Care – PPO | Admitting: Family Medicine

## 2020-02-06 VITALS — Temp 100.6°F

## 2020-02-06 DIAGNOSIS — U071 COVID-19: Secondary | ICD-10-CM

## 2020-02-06 DIAGNOSIS — R059 Cough, unspecified: Secondary | ICD-10-CM

## 2020-02-06 HISTORY — DX: COVID-19: U07.1

## 2020-02-06 MED ORDER — HYDROCODONE-HOMATROPINE 5-1.5 MG/5ML PO SYRP: ORAL_SOLUTION | ORAL | 0 refills | Status: DC

## 2020-02-06 NOTE — Progress Notes (Signed)
Virtual Visit via Video Note  I connected with Samantha Doyle on 02/06/20 at  4:00 PM EST by a video enabled telemedicine application and verified that I am speaking with the correct person using two identifiers.  Location patient: home, [place] Location provider:work or home office Persons participating in the virtual visit: patient, provider  I discussed the limitations of evaluation and management by telemedicine and the availability of in person appointments. The patient expressed understanding and agreed to proceed.  Telemedicine visit is a necessity given the COVID-19 restrictions in place at the current time.  HPI: 45 y/o WF being seen today for HA and fatigue. Onset 3-4 d/a ->HA, very tired, developed fever to 101.2 next day, cough and nasal congestion.  Cough forceful and hurts chest to cough.  Not a lot of phlegm coming up.  OTC cough med no help.  Using albut hfa 2 p q4h and it helps some. Covid test positive today.  Eating and drinking well No n/v/d.  ROS: See pertinent positives and negatives per HPI.  Past Medical History:  Diagnosis Date  . Allergic rhinitis   . Arthritis   . Carpal tunnel syndrome    right wrist, numb all the time  . Cervical dysplasia   . Chronic back pain    Degenerative lumbar spondylosis with grade 2 spondylolisthesis L4 on L5 with bilater pars defects L4.   Marland Kitchen Chronic bronchitis ([place])    hx of when she was a smoker: ? mild intermittent asthma?--much better since stopped smoking 2018.  . Colon cancer screening 01/2019   GI was unable to get in contact with Samantha Doyle  . DDD (degenerative disc disease), lumbar    MRI 10/2015: grade I (21m) anterolisthesis L4 on L5, w/ disc herniation with encroachment on spinal nerves at L4-S1 levels.  . Depression   . GERD (gastroesophageal reflux disease)    worsened 07/2018 likely associated with lap band being too constricting->increased pantoprazole to 437mbid at that time.  . Marland Kitchenistory of fracture of phalanx of finger    left,  4th finger distal phalanx  . History of non anemic vitamin B12 deficiency    s/p lap band surgery per Samantha Doyle report.  . Hyperlipidemia   . Hypertension    off all bp meds x 3-4 years  . Iron deficiency anemia 01/2019   hemoccults x 3 pending, GI referral. GI was unable to get in contact with Samantha Doyle  . LAP-BAND surgery status    Samantha Doyle getting lap band removed and is getting gastric sleave after.  . Migraine syndrome   . Ovarian cyst 10/2015   LEFT, 4 cm--noted on L spine MRI w/out contrast.  F/u u/s imaging showed simple cyst.  . Spondylarthrosis   . Tobacco dependence in remission    quit 2018    Past Surgical History:  Procedure Laterality Date  . BACK SURGERY  2019  . CARPAL TUNNEL RELEASE Left   . CERVICAL CONE BIOPSY    . COLONOSCOPY     2015  . ESOPHAGOGASTRODUODENOSCOPY N/A 04/27/2014   Procedure: ESOPHAGOGASTRODUODENOSCOPY (EGD);  Surgeon: DaAlphonsa OverallMD;  [facility];  Service: General;  Laterality: N/A;  . GASTRIC BANDING PORT REVISION N/A 10/02/2014   Procedure: LAPROSCOPIC PLACEMENT OF LAP BAND PORT;  Surgeon: BeExcell SeltzerMD;  [facility];  Service: General;  Laterality: N/A;  . LAPAROSCOPIC GASTRIC BANDING  03/2007   [facility]  . LAPAROSCOPIC REVISION OF GASTRIC BAND N/A 05/17/2012  Procedure: LAPAROSCOPIC REVISION OF SLIPPED GASTRIC BAND;  Surgeon: Edward Jolly, MD;  [facility];  Service: General;  Laterality: N/A;  . LAPAROSCOPY N/A 04/30/2014   Procedure: DIAGNOSTIC LAPAROSCOPY WITH REMOVAL OF INFECTED LAP BAND PORT WITH DEBRIDEMENT SUBCUTANEOUS ABSCESS;  Surgeon: Greer Pickerel, MD;  [facility];  Service: General;  Laterality: N/A;  . VARICOSE VEIN SURGERY Bilateral 2009     Current Outpatient Medications:  .  albuterol (VENTOLIN HFA) 108 (90 Base) MCG/ACT inhaler, INHALE 2 PUFFS INTO THE LUNGS EVERY 4 (FOUR) HOURS AS NEEDED FOR WHEEZING OR SHORTNESS OF BREATH., Disp: 8 g, Rfl: 0 .  baclofen  (LIORESAL) 10 MG tablet, Take 10 mg by mouth every 8 (eight) hours., Disp: , Rfl:  .  cetirizine (ZYRTEC) 10 MG tablet, Take 20 mg by mouth daily as needed for allergies (allergies). , Disp: , Rfl:  .  clonazePAM (KLONOPIN) 0.5 MG tablet, TAKE 1 TABLET BY MOUTH TWICE A DAY AS NEEDED, Disp: 180 tablet, Rfl: 0 .  diclofenac (VOLTAREN) 75 MG EC tablet, Take 1 tablet by mouth 2 (two) times daily. , Disp: , Rfl:  .  DULoxetine (CYMBALTA) 30 MG capsule, 1 cap qd, Disp: 90 capsule, Rfl: 3 .  fluticasone (CUTIVATE) 0.05 % cream, Apply to affected area twice a day as needed for itchy rash (Patient not taking: No sig reported), Disp: 30 g, Rfl: 1 .  gabapentin (NEURONTIN) 800 MG tablet, Take 800 mg by mouth 3 (three) times daily., Disp: , Rfl: 6 .  lisinopril (ZESTRIL) 40 MG tablet, Take 1 tablet (40 mg total) by mouth daily., Disp: 90 tablet, Rfl: 3 .  Multiple Vitamin (MULITIVITAMIN WITH MINERALS) TABS, Take 1 tablet by mouth daily., Disp: , Rfl:  .  NARCAN 4 MG/0.1ML LIQD nasal spray kit, USE ONE SPRAY INTRANASAL EVERY TWO TO THREE MINUTES UNTIL EMERGENCY TEAM ARRIVES AS DIRECTED, USE IN OPOID EMERGENCY ONLY (Patient not taking: No sig reported), Disp: , Rfl:  .  oxyCODONE (ROXICODONE) 15 MG immediate release tablet, TAKE ONE TABLET BY MOUTH EVERY 6 HOURS FOR 30 DAYS, Disp: , Rfl:  .  pantoprazole (PROTONIX) 40 MG tablet, TAKE 1 TABLET BY MOUTH TWICE A DAY, Disp: 180 tablet, Rfl: 1 .  rizatriptan (MAXALT-MLT) 10 MG disintegrating tablet, Take 1 tablet (10 mg total) by mouth as needed for migraine. May repeat in 2 hours if needed, Disp: 10 tablet, Rfl: 6  EXAM:  VITALS per patient if applicable:   Vitals with BMI 02/02/2020 07/14/2019 01/27/2019  Height 5' 2.75" - 5' 2.75"  Weight 234 lbs 245 lbs 264 lbs 10 oz  BMI 41.93 - 79.02  Systolic 409 735 329  Diastolic 74 89 85  Pulse 64 - 62     GENERAL: alert, oriented, appears tired but is in no acute distress.   HEENT: atraumatic, conjunttiva clear, no  obvious abnormalities on inspection of external nose and ears  NECK: normal movements of the head and neck  LUNGS: on inspection no signs of respiratory distress, breathing rate appears normal, no obvious gross SOB, gasping or wheezing  CV: no obvious cyanosis  MS: moves all visible extremities without noticeable abnormality  PSYCH/NEURO: pleasant and cooperative, no obvious depression or anxiety, speech and thought processing grossly intact  LABS: none today    Chemistry      Component Value Date/Time   NA 140 02/02/2020 1500   K 4.0 02/02/2020 1500   CL 103 02/02/2020 1500   CO2 32 02/02/2020 1500  BUN 9 02/02/2020 1500   CREATININE 0.72 02/02/2020 1500   CREATININE 0.61 08/07/2017 1508      Component Value Date/Time   CALCIUM 9.5 02/02/2020 1500   ALKPHOS 60 02/02/2020 1500   AST 15 02/02/2020 1500   ALT 13 02/02/2020 1500   BILITOT 0.5 02/02/2020 1500      ASSESSMENT AND PLAN:  Discussed the following assessment and plan:  Covid respiratory infection. Symptomatic care and quarantine precautions reviewed. Will rx hycodan syrup to help with her painful cough: 1 tsp tid prn, #12m. Cont albut hfa 2p q4h prn+ push fluids and rest. Signs/symptoms to call or return for were reviewed and Samantha Doyle expressed understanding.  I discussed the assessment and treatment plan with the patient. The patient was provided an opportunity to ask questions and all were answered. The patient agreed with the plan and demonstrated an understanding of the instructions.   F/u: if not improving in 4-5 d or if worsening prior  Signed:  PCrissie Sickles MD           02/06/2020

## 2020-02-08 ENCOUNTER — Other Ambulatory Visit: Payer: Self-pay

## 2020-02-08 DIAGNOSIS — D509 Iron deficiency anemia, unspecified: Secondary | ICD-10-CM

## 2020-03-05 DIAGNOSIS — M4726 Other spondylosis with radiculopathy, lumbar region: Secondary | ICD-10-CM | POA: Diagnosis not present

## 2020-03-05 DIAGNOSIS — G894 Chronic pain syndrome: Secondary | ICD-10-CM | POA: Diagnosis not present

## 2020-03-05 DIAGNOSIS — M961 Postlaminectomy syndrome, not elsewhere classified: Secondary | ICD-10-CM | POA: Diagnosis not present

## 2020-03-05 DIAGNOSIS — M5106 Intervertebral disc disorders with myelopathy, lumbar region: Secondary | ICD-10-CM | POA: Diagnosis not present

## 2020-03-05 DIAGNOSIS — M791 Myalgia, unspecified site: Secondary | ICD-10-CM | POA: Diagnosis not present

## 2020-03-05 DIAGNOSIS — M5136 Other intervertebral disc degeneration, lumbar region: Secondary | ICD-10-CM | POA: Diagnosis not present

## 2020-03-05 DIAGNOSIS — Z79891 Long term (current) use of opiate analgesic: Secondary | ICD-10-CM | POA: Diagnosis not present

## 2020-03-07 ENCOUNTER — Ambulatory Visit: Payer: BC Managed Care – PPO

## 2020-03-13 ENCOUNTER — Ambulatory Visit: Payer: BC Managed Care – PPO

## 2020-03-27 ENCOUNTER — Ambulatory Visit: Payer: BC Managed Care – PPO | Admitting: Gastroenterology

## 2020-04-23 DIAGNOSIS — Z79891 Long term (current) use of opiate analgesic: Secondary | ICD-10-CM | POA: Diagnosis not present

## 2020-04-23 DIAGNOSIS — Z5181 Encounter for therapeutic drug level monitoring: Secondary | ICD-10-CM | POA: Diagnosis not present

## 2020-04-23 DIAGNOSIS — G894 Chronic pain syndrome: Secondary | ICD-10-CM | POA: Diagnosis not present

## 2020-04-30 ENCOUNTER — Other Ambulatory Visit: Payer: Self-pay | Admitting: Family Medicine

## 2020-05-07 DIAGNOSIS — M4726 Other spondylosis with radiculopathy, lumbar region: Secondary | ICD-10-CM | POA: Diagnosis not present

## 2020-05-07 DIAGNOSIS — G894 Chronic pain syndrome: Secondary | ICD-10-CM | POA: Diagnosis not present

## 2020-05-07 DIAGNOSIS — M5136 Other intervertebral disc degeneration, lumbar region: Secondary | ICD-10-CM | POA: Diagnosis not present

## 2020-05-07 DIAGNOSIS — M5106 Intervertebral disc disorders with myelopathy, lumbar region: Secondary | ICD-10-CM | POA: Diagnosis not present

## 2020-05-07 DIAGNOSIS — Z79891 Long term (current) use of opiate analgesic: Secondary | ICD-10-CM | POA: Diagnosis not present

## 2020-05-07 DIAGNOSIS — M791 Myalgia, unspecified site: Secondary | ICD-10-CM | POA: Diagnosis not present

## 2020-05-14 ENCOUNTER — Ambulatory Visit: Payer: BC Managed Care – PPO | Admitting: Gastroenterology

## 2020-05-23 ENCOUNTER — Other Ambulatory Visit: Payer: Self-pay | Admitting: Family Medicine

## 2020-06-14 ENCOUNTER — Telehealth: Payer: Self-pay

## 2020-06-14 MED ORDER — DULOXETINE HCL 60 MG PO CPEP: 60.0000 mg | ORAL_CAPSULE | Freq: Every day | ORAL | 3 refills | Status: DC

## 2020-06-14 NOTE — Telephone Encounter (Signed)
LM for pt to return call regarding rx. Last seen 02/02/20, "Decrease cymbalta to one 30 mg capsule qd". Medication was sent [facility] year supply to [facility] [place]

## 2020-06-14 NOTE — Telephone Encounter (Signed)
OK, I just sent in rx for 60mg  capsule, take ONE daily.

## 2020-06-14 NOTE — Telephone Encounter (Signed)
Spoke with pt and she has been taking 2 capsules po qd instead of 1. She feels better on 2 capsules qd instead of 1 due to life circumstances. She was due to f/u for appt 1 month after 02/02/20. Advised to schedule f/u, appt set for Monday 6/13. She has 2 pills left and will be out by tomorrow.  Please Advise.

## 2020-06-14 NOTE — Telephone Encounter (Signed)
Patient refill request.    DULoxetine (CYMBALTA) 30 MG capsule [[id number]]    [id number] [address], [place]

## 2020-06-15 NOTE — Telephone Encounter (Signed)
Pt has been advised of refill and to keep scheduled appt for Monday 6/13

## 2020-06-18 ENCOUNTER — Telehealth (INDEPENDENT_AMBULATORY_CARE_PROVIDER_SITE_OTHER): Payer: BC Managed Care – PPO | Admitting: Family Medicine

## 2020-06-18 ENCOUNTER — Encounter: Payer: Self-pay | Admitting: Family Medicine

## 2020-06-18 ENCOUNTER — Other Ambulatory Visit: Payer: Self-pay

## 2020-06-18 DIAGNOSIS — D500 Iron deficiency anemia secondary to blood loss (chronic): Secondary | ICD-10-CM

## 2020-06-18 DIAGNOSIS — F411 Generalized anxiety disorder: Secondary | ICD-10-CM | POA: Diagnosis not present

## 2020-06-18 DIAGNOSIS — I10 Essential (primary) hypertension: Secondary | ICD-10-CM | POA: Diagnosis not present

## 2020-06-18 DIAGNOSIS — F3340 Major depressive disorder, recurrent, in remission, unspecified: Secondary | ICD-10-CM

## 2020-06-18 MED ORDER — PANTOPRAZOLE SODIUM 40 MG PO TBEC: 1.0000 | DELAYED_RELEASE_TABLET | Freq: Two times a day (BID) | ORAL | 0 refills | Status: DC

## 2020-06-18 NOTE — Progress Notes (Signed)
Virtual Visit via Video Note  I connected with pt on 06/18/20 at  2:00 PM EDT by a video enabled telemedicine application and verified that I am speaking with the correct person using two identifiers.  Location patient: home, [place] Location provider:work or home office Persons participating in the virtual visit: patient, provider  I discussed the limitations of evaluation and management by telemedicine and the availability of in person appointments. The patient expressed understanding and agreed to proceed.   HPI: 45 y/o WF being seen today for f/u "memory loss, anxiety/depression->cymbalta dosing" plus 6 mo f/u iron def anemia and HTN. A/P as of last visit 02/02/20 "1) Iron Deficiency Anemia: Patient found to have iron deficiency anemia 1 yr ago. Currently, patient not taking iron supplementation. Patient has tried to increase dietary iron load without success 2/2 lap gastric band. Today, patient appears grossly pale on exam. Physical exam otherwise unremarkable. Patient open to iron supplementation today. Etiology likely nutritional 2/2 lap gastric band impacting eating, but this is diagnosis of exclusion. Patient having regular menses with mild/normal menstrual blood loss, likely not the cause of patients anemia. Will need to get fecal hemoccult cards x 3 to rule out possible GI cause of IDA. Patient also due for colonoscopy which may reveal potential cause of iron deficiency. - CBC, Iron studies today. - Hemoccult Cards x 3.   2) MDD/anxiety: Anxiety and depression managed well. Patient would like to decrease cymbalta to qd instead of the current BID dosing. Patient states her memory has decreased since taking BID. - Decrease cymbalta to one 30 mg capsule qd. Cont clonaz 0.$RemoveBefor'5mg'PtsIchkSZEtX$  bid prn.  No new rx needed today.   3) Health Maintenance Exam: We reviewed age and gender appropriate health maintenance issues (prudent diet, regular exercise, health risks of tobacco and excessive alcohol, use of  seatbelts, fire alarms in home, use of sunscreen). Also reviewed age and gender appropriate health screening as well as vaccine recommendations. Labs: check a CBC, CMP, TSH, and FLP. Vaccines: Tdap UTD. Colon ca screening:  refer to GI today for colonoscopy. Breast cancer screening: patient would like mammogram. Ordered today. Cervical cancer screening: last pap appears to be 2016. Referral to GYN today today per pt's preference."  INTERIM HX: Says she's feeling pretty good.  Busy as usual.  Had covid Feb 2022.  Iron signif low last visit so I recommended she start FeSO4 325 qd. She only tried taking this on empty stomach and it consistently made her sick. Still trying to eat a more iron rich diet. Pt has not turned in hemoccult cards yet.  Since I saw her last she decided to keep her cymbalta at $RemoveBef'60mg'GThBFwxYKO$  qd dosing.  Mom dx'd with lung ca, pt's GM with coma from covid (all ok now), and pt very stressed and a lot going on for her to cope with so she remained on $RemoveBef'60mg'wJKGXkOwjB$  qd dosing.   Some forgetfulness, managing this ok with using her phone to reminder her of appts, etc. Rarely taking clonazepam, last dose approx 3 mo ago. PMP AWARE reviewed today: most recent rx for clonazepam 0.$RemoveBeforeDE'5mg'mJkcVMSvbNzCqLC$  was filled 11/08/19, # 180, rx by me. No red flags.   No home bp monitoring but she's compliant with lisinopril $RemoveBeforeD'40mg'BXmEJuduAipNsm$  qd.  ROS as above, plus--> no fevers, no CP, no SOB, no wheezing, no cough, no dizziness, no HAs, no rashes, no melena/hematochezia.  No polyuria or polydipsia.  No myalgias or arthralgias.  No focal weakness, paresthesias, or tremors.  No acute vision or  hearing abnormalities.  No dysuria or unusual/new urinary urgency or frequency.  No recent changes in lower legs. No n/v/d or abd pain.  No palpitations.    Past Medical History:  Diagnosis Date   Allergic rhinitis    Arthritis    Carpal tunnel syndrome    right wrist, numb all the time   Cervical dysplasia    Chronic back pain    Degenerative  lumbar spondylosis with grade 2 spondylolisthesis L4 on L5 with bilater pars defects L4.    COVID-19 virus infection 02/06/2020   DDD (degenerative disc disease), lumbar    MRI 10/2015: grade I (28mm) anterolisthesis L4 on L5, w/ disc herniation with encroachment on spinal nerves at L4-S1 levels.   Depression    GERD (gastroesophageal reflux disease)    worsened 07/2018 likely associated with lap band being too constricting->increased pantoprazole to $RemoveBeforeD'40mg'PoQGAWOTIajIKZ$  bid at that time.   History of chronic bronchitis    hx of when she was a smoker: ? mild intermittent asthma?--much better since stopped smoking 2018.   History of fracture of phalanx of finger    left, 4th finger distal phalanx   History of non anemic vitamin B12 deficiency    s/p lap band surgery per pt report.   Hyperlipidemia    Hypertension    off all bp meds x 3-4 years   Iron deficiency anemia 01/2019   hemoccults ordered but never turned in (01/2019-01/2020). Iron supp started 02/2020   LAP-BAND surgery status    Pt getting lap band removed and is getting gastric sleave after.   Migraine syndrome    Ovarian cyst 10/2015   LEFT, 4 cm--noted on L spine MRI w/out contrast.  F/u u/s imaging showed simple cyst.   Spondylarthrosis    Tobacco dependence in remission    quit 2018    Past Surgical History:  Procedure Laterality Date   BACK SURGERY  2019   CARPAL TUNNEL RELEASE Left    CERVICAL CONE BIOPSY     COLONOSCOPY     2015   ESOPHAGOGASTRODUODENOSCOPY N/A 04/27/2014   Procedure: ESOPHAGOGASTRODUODENOSCOPY (EGD);  Surgeon: Alphonsa Overall, MD;  [facility];  Service: General;  Laterality: N/A;   GASTRIC BANDING PORT REVISION N/A 10/02/2014   Procedure: LAPROSCOPIC PLACEMENT OF LAP BAND PORT;  Surgeon: Excell Seltzer, MD;  [facility];  Service: General;  Laterality: N/A;   LAPAROSCOPIC GASTRIC BANDING  03/2007   [facility]; Dr Octavia Bruckner Hipp   LAPAROSCOPIC REVISION OF GASTRIC BAND N/A  05/17/2012   Procedure: LAPAROSCOPIC REVISION OF SLIPPED GASTRIC BAND;  Surgeon: Edward Jolly, MD;  [facility];  Service: General;  Laterality: N/A;   LAPAROSCOPY N/A 04/30/2014   Procedure: DIAGNOSTIC LAPAROSCOPY WITH REMOVAL OF INFECTED LAP BAND PORT WITH DEBRIDEMENT SUBCUTANEOUS ABSCESS;  Surgeon: Greer Pickerel, MD;  [facility];  Service: General;  Laterality: N/A;   VARICOSE VEIN SURGERY Bilateral 2009     Current Outpatient Medications:    baclofen (LIORESAL) 10 MG tablet, Take 10 mg by mouth every 8 (eight) hours., Disp: , Rfl:    cetirizine (ZYRTEC) 10 MG tablet, Take 20 mg by mouth daily as needed for allergies (allergies). , Disp: , Rfl:    diclofenac (VOLTAREN) 75 MG EC tablet, Take 1 tablet by mouth 2 (two) times daily. , Disp: , Rfl:    DULoxetine (CYMBALTA) 60 MG capsule, Take 1 capsule (60 mg total) by mouth daily., Disp: 90 capsule, Rfl: 3  gabapentin (NEURONTIN) 800 MG tablet, Take 800 mg by mouth 3 (three) times daily., Disp: , Rfl: 6   lisinopril (ZESTRIL) 40 MG tablet, Take 1 tablet (40 mg total) by mouth daily., Disp: 90 tablet, Rfl: 3   Multiple Vitamin (MULITIVITAMIN WITH MINERALS) TABS, Take 1 tablet by mouth daily., Disp: , Rfl:    oxyCODONE (ROXICODONE) 15 MG immediate release tablet, TAKE ONE TABLET BY MOUTH EVERY 6 HOURS FOR 30 DAYS, Disp: , Rfl:    pantoprazole (PROTONIX) 40 MG tablet, TAKE 1 TABLET BY MOUTH TWICE A DAY, Disp: 60 tablet, Rfl: 0   rizatriptan (MAXALT-MLT) 10 MG disintegrating tablet, Take 1 tablet (10 mg total) by mouth as needed for migraine. May repeat in 2 hours if needed, Disp: 10 tablet, Rfl: 6   albuterol (VENTOLIN HFA) 108 (90 Base) MCG/ACT inhaler, INHALE 2 PUFFS INTO THE LUNGS EVERY 4 (FOUR) HOURS AS NEEDED FOR WHEEZING OR SHORTNESS OF BREATH. (Patient not taking: Reported on 06/18/2020), Disp: 8 g, Rfl: 0   clonazePAM (KLONOPIN) 0.5 MG tablet, TAKE 1 TABLET BY MOUTH TWICE A DAY AS NEEDED (Patient not taking: Reported on  06/18/2020), Disp: 180 tablet, Rfl: 0   NARCAN 4 MG/0.1ML LIQD nasal spray kit, USE ONE SPRAY INTRANASAL EVERY TWO TO THREE MINUTES UNTIL EMERGENCY TEAM ARRIVES AS DIRECTED, USE IN OPOID EMERGENCY ONLY (Patient not taking: Reported on 06/18/2020), Disp: , Rfl:   EXAM:  VITALS per patient if applicable:  Vitals with BMI 02/02/2020 07/14/2019 01/27/2019  Height 5' 2.75" - 5' 2.75"  Weight 234 lbs 245 lbs 264 lbs 10 oz  BMI 01.31 - 43.88  Systolic 875 797 282  Diastolic 74 89 85  Pulse 64 - 62     GENERAL: alert, oriented, appears well and in no acute distress  HEENT: atraumatic, conjunttiva clear, no obvious abnormalities on inspection of external nose and ears  NECK: normal movements of the head and neck  LUNGS: on inspection no signs of respiratory distress, breathing rate appears normal, no obvious gross SOB, gasping or wheezing  CV: no obvious cyanosis  MS: moves all visible extremities without noticeable abnormality  PSYCH/NEURO: pleasant and cooperative, no obvious depression or anxiety, speech and thought processing grossly intact  LABS: none today    Chemistry      Component Value Date/Time   NA 140 02/02/2020 1500   K 4.0 02/02/2020 1500   CL 103 02/02/2020 1500   CO2 32 02/02/2020 1500   BUN 9 02/02/2020 1500   CREATININE 0.72 02/02/2020 1500   CREATININE 0.61 08/07/2017 1508      Component Value Date/Time   CALCIUM 9.5 02/02/2020 1500   ALKPHOS 60 02/02/2020 1500   AST 15 02/02/2020 1500   ALT 13 02/02/2020 1500   BILITOT 0.5 02/02/2020 1500     Lab Results  Component Value Date   WBC 6.8 02/02/2020   HGB 8.4 Repeated and verified X2. (L) 02/02/2020   HCT 28.1 (L) 02/02/2020   MCV 67.5 Repeated and verified X2. (L) 02/02/2020   PLT 284.0 02/02/2020   Lab Results  Component Value Date   IRON 14 (L) 02/02/2020   TIBC 381 02/02/2020   FERRITIN 5 (L) 02/02/2020   Lab Results  Component Value Date   SUORVIFB37 943 03/21/2016    ASSESSMENT AND  PLAN:  Discussed the following assessment and plan:  1) GAD, recurrent MDD in remission: some memory impairment that very likely stress related.  No progression. Cont cymbalta $RemoveBefor'60mg'kQRAKKfOTzuo$  qd. Cont prn use of  clonaz-->she rarely uses it. Needs CSC updated at next visit.  2) HTN: stable on lisinopril $RemoveBefor'40mg'NbRngmJogYzm$  qd. Lytes/cr ordered--future.  3) Iron deficiency anemia: likely combo of inadequate dietary intake + menstrual blood loss. Has hemoccults at home to look for occult GI bleeding and says she'll do these ASAP. Come by for lab appt for repeat cbc and iron panel. I encouraged her to try FeSO4 325 qd again WITH FOOD to see if she tolerates it from GI standpoint that way.  If not, will proceed with iron infusion.  I discussed the assessment and treatment plan with the patient. The patient was provided an opportunity to ask questions and all were answered. The patient agreed with the plan and demonstrated an understanding of the instructions.   F/u: 34mo  Signed:  Crissie Sickles, MD           06/18/2020

## 2020-06-20 ENCOUNTER — Ambulatory Visit: Payer: BC Managed Care – PPO

## 2020-06-30 DIAGNOSIS — G43011 Migraine without aura, intractable, with status migrainosus: Secondary | ICD-10-CM | POA: Diagnosis not present

## 2020-06-30 DIAGNOSIS — R110 Nausea: Secondary | ICD-10-CM | POA: Diagnosis not present

## 2020-07-01 DIAGNOSIS — Z20822 Contact with and (suspected) exposure to covid-19: Secondary | ICD-10-CM | POA: Diagnosis not present

## 2020-07-10 ENCOUNTER — Other Ambulatory Visit: Payer: Self-pay | Admitting: Family Medicine

## 2020-07-30 DIAGNOSIS — G894 Chronic pain syndrome: Secondary | ICD-10-CM | POA: Diagnosis not present

## 2020-07-30 DIAGNOSIS — Z79891 Long term (current) use of opiate analgesic: Secondary | ICD-10-CM | POA: Diagnosis not present

## 2020-07-30 DIAGNOSIS — M791 Myalgia, unspecified site: Secondary | ICD-10-CM | POA: Diagnosis not present

## 2020-07-30 DIAGNOSIS — M4726 Other spondylosis with radiculopathy, lumbar region: Secondary | ICD-10-CM | POA: Diagnosis not present

## 2020-07-30 DIAGNOSIS — M961 Postlaminectomy syndrome, not elsewhere classified: Secondary | ICD-10-CM | POA: Diagnosis not present

## 2020-07-30 DIAGNOSIS — M5106 Intervertebral disc disorders with myelopathy, lumbar region: Secondary | ICD-10-CM | POA: Diagnosis not present

## 2020-07-30 DIAGNOSIS — M5136 Other intervertebral disc degeneration, lumbar region: Secondary | ICD-10-CM | POA: Diagnosis not present

## 2020-08-01 ENCOUNTER — Telehealth: Payer: Self-pay

## 2020-08-01 ENCOUNTER — Ambulatory Visit (INDEPENDENT_AMBULATORY_CARE_PROVIDER_SITE_OTHER): Payer: BC Managed Care – PPO

## 2020-08-01 ENCOUNTER — Other Ambulatory Visit: Payer: Self-pay

## 2020-08-01 DIAGNOSIS — D509 Iron deficiency anemia, unspecified: Secondary | ICD-10-CM

## 2020-08-01 DIAGNOSIS — D500 Iron deficiency anemia secondary to blood loss (chronic): Secondary | ICD-10-CM | POA: Diagnosis not present

## 2020-08-01 DIAGNOSIS — I10 Essential (primary) hypertension: Secondary | ICD-10-CM

## 2020-08-01 LAB — BASIC METABOLIC PANEL
BUN: 11 mg/dL (ref 6–23)
CO2: 29 mEq/L (ref 19–32)
Calcium: 8.8 mg/dL (ref 8.4–10.5)
Chloride: 103 mEq/L (ref 96–112)
Creatinine, Ser: 0.64 mg/dL (ref 0.40–1.20)
GFR: 105.94 mL/min (ref >60.00)
Glucose, Bld: 85 mg/dL (ref 70–99)
Potassium: 4.3 mEq/L (ref 3.5–5.1)
Sodium: 138 mEq/L (ref 135–145)

## 2020-08-01 LAB — HEMOCCULT SLIDES (X 3 CARDS)
Fecal Occult Blood: NEGATIVE
OCCULT 1: NEGATIVE
OCCULT 2: NEGATIVE
OCCULT 3: NEGATIVE
OCCULT 4: NEGATIVE
OCCULT 5: NEGATIVE

## 2020-08-01 LAB — CBC
HCT: 24.8 % — ABNORMAL LOW (ref 36.0–46.0)
Hemoglobin: 7.3 g/dL — CL (ref 12.0–15.0)
MCHC: 29.6 g/dL — ABNORMAL LOW (ref 30.0–36.0)
MCV: 62.5 fl — ABNORMAL LOW (ref 78.0–100.0)
Platelets: 210 10*3/uL (ref 150.0–400.0)
RBC: 3.97 Mil/uL (ref 3.87–5.11)
RDW: 18.5 % — ABNORMAL HIGH (ref 11.5–15.5)
WBC: 5.4 10*3/uL (ref 4.0–10.5)

## 2020-08-01 NOTE — Telephone Encounter (Signed)
This result has been addressed as a result note attached to lab results.

## 2020-08-01 NOTE — Telephone Encounter (Signed)
-----   Message from Jeoffrey Massed, MD sent at 08/01/2020  4:59 PM EDT ----- Her anemia is a bit worse than 6 mo ago (hb now down to 7.3). Needs iron infusion, pls start this process, dx is iron deficiency anemia unspecified.  Also, she needs to see gastroenterologist to eval for bleeding from her gastrointestinal tract.  I referred her back in Jan this year but she decided not to go.  Pls order GI referral to [facility], dx is iron deficiency anemia.

## 2020-08-01 NOTE — Telephone Encounter (Signed)
CRITICAL VALUE STICKER  CRITICAL VALUE: hemoglobin 7.3  RECEIVER (on-site recipient of call):Samantha Doyle  DATE & TIME NOTIFIED: 1615   MESSENGER (representative from lab):Karen   MD NOTIFIED:[place]   TIME OF NOTIFICATION:1630  RESPONSE:

## 2020-08-02 ENCOUNTER — Encounter: Payer: Self-pay | Admitting: Family Medicine

## 2020-08-02 LAB — IRON,TIBC AND FERRITIN PANEL
%SAT: 4 % (calc) — ABNORMAL LOW (ref 16–45)
Ferritin: 6 ng/mL — ABNORMAL LOW (ref 16–232)
Iron: 15 ug/dL — ABNORMAL LOW (ref 40–190)
TIBC: 407 mcg/dL (calc) (ref 250–450)

## 2020-08-06 ENCOUNTER — Telehealth: Payer: Self-pay | Admitting: Family Medicine

## 2020-08-06 ENCOUNTER — Telehealth: Payer: BC Managed Care – PPO | Admitting: Family Medicine

## 2020-08-06 NOTE — Telephone Encounter (Signed)
Patient called regarding 08/06/20 11:00 appt. I checked with staff and was advised that she needs to keep this appt and virtual visit would be fine. Patient did not want to keep appt either way. I advised her of $50 fee and she stated that was acceptable.

## 2020-08-06 NOTE — Progress Notes (Deleted)
OFFICE VISIT  08/06/2020  CC: No chief complaint on file.   HPI:    Patient is a 45 y.o. Caucasian female who presents for discussion of iron def anemia/recent labs 4 d/a. Hx of IDA, with anemia is a bit worse than 6 mo ago (hb now down to 7.3).  Hemoccults neg x 3.  Suspect she has malabsorption + *** menorrhagia. Never has taken oral iron much d/t forgetting + some intol.  She needs iron infusion.Also, she needs to see gastroenterologist to eval for bleeding from her gastrointestinal tract.  I referred her back in Jan this year but she decidednot to go.  Pls order GI referral to [facility], dx is iron deficiency anemia.  CURRENTLY: ***  Past Medical History:  Diagnosis Date   Allergic rhinitis    Arthritis    Carpal tunnel syndrome    right wrist, numb all the time   Cervical dysplasia    Chronic back pain    Degenerative lumbar spondylosis with grade 2 spondylolisthesis L4 on L5 with bilater pars defects L4.    COVID-19 virus infection 02/06/2020   DDD (degenerative disc disease), lumbar    MRI 10/2015: grade I (12mm) anterolisthesis L4 on L5, w/ disc herniation with encroachment on spinal nerves at L4-S1 levels.   Depression    GERD (gastroesophageal reflux disease)    worsened 07/2018 likely associated with lap band being too constricting->increased pantoprazole to $RemoveBeforeD'40mg'[id number]$  bid at that time.   History of chronic bronchitis    hx of when she was a smoker: ? mild intermittent asthma?--much better since stopped smoking 2018.   History of fracture of phalanx of finger    left, 4th finger distal phalanx   History of non anemic vitamin B12 deficiency    s/p lap band surgery per pt report.   Hyperlipidemia    Hypertension    off all bp meds x 3-4 years   Iron deficiency anemia 01/2019   hemoccults ordered but never turned in (01/2019-01/2020). Iron supp started 02/2020. Hemoccults neg 07/2020, plan for iron infusion.   LAP-BAND surgery status    Pt getting lap band removed and is getting  gastric sleave after.   Migraine syndrome    Ovarian cyst 10/2015   LEFT, 4 cm--noted on L spine MRI w/out contrast.  F/u u/s imaging showed simple cyst.   Spondylarthrosis    Tobacco dependence in remission    quit 2018    Past Surgical History:  Procedure Laterality Date   BACK SURGERY  2019   CARPAL TUNNEL RELEASE Left    CERVICAL CONE BIOPSY     COLONOSCOPY     2015   ESOPHAGOGASTRODUODENOSCOPY N/A 04/27/2014   Procedure: ESOPHAGOGASTRODUODENOSCOPY (EGD);  Surgeon: Alphonsa Overall, MD;  Location: [facility];  Service: General;  Laterality: N/A;   GASTRIC BANDING PORT REVISION N/A 10/02/2014   Procedure: LAPROSCOPIC PLACEMENT OF LAP BAND PORT;  Surgeon: Excell Seltzer, MD;  Location: [facility];  Service: General;  Laterality: N/A;   LAPAROSCOPIC GASTRIC BANDING  03/2007   [facility] - [facility]; Dr Octavia Bruckner Hipp   LAPAROSCOPIC REVISION OF GASTRIC BAND N/A 05/17/2012   Procedure: LAPAROSCOPIC REVISION OF SLIPPED GASTRIC BAND;  Surgeon: Edward Jolly, MD;  Location: [facility];  Service: General;  Laterality: N/A;   LAPAROSCOPY N/A 04/30/2014   Procedure: DIAGNOSTIC LAPAROSCOPY WITH REMOVAL OF INFECTED LAP BAND PORT WITH DEBRIDEMENT SUBCUTANEOUS ABSCESS;  Surgeon: Greer Pickerel, MD;  Location: [facility];  Service: General;  Laterality: N/A;  VARICOSE VEIN SURGERY Bilateral 2009    Outpatient Medications Prior to Visit  Medication Sig Dispense Refill   albuterol (VENTOLIN HFA) 108 (90 Base) MCG/ACT inhaler INHALE 2 PUFFS INTO THE LUNGS EVERY 4 (FOUR) HOURS AS NEEDED FOR WHEEZING OR SHORTNESS OF BREATH. (Patient not taking: Reported on 06/18/2020) 8 g 0   baclofen (LIORESAL) 10 MG tablet Take 10 mg by mouth every 8 (eight) hours.     cetirizine (ZYRTEC) 10 MG tablet Take 20 mg by mouth daily as needed for allergies (allergies).      clonazePAM (KLONOPIN) 0.5 MG tablet TAKE 1 TABLET BY MOUTH TWICE A DAY AS NEEDED (Patient not taking: Reported on 06/18/2020) 180 tablet 0   diclofenac  (VOLTAREN) 75 MG EC tablet Take 1 tablet by mouth 2 (two) times daily.      DULoxetine (CYMBALTA) 60 MG capsule Take 1 capsule (60 mg total) by mouth daily. 90 capsule 3   gabapentin (NEURONTIN) 800 MG tablet Take 800 mg by mouth 3 (three) times daily.  6   lisinopril (ZESTRIL) 40 MG tablet Take 1 tablet (40 mg total) by mouth daily. 90 tablet 3   Multiple Vitamin (MULITIVITAMIN WITH MINERALS) TABS Take 1 tablet by mouth daily.     NARCAN 4 MG/0.1ML LIQD nasal spray kit USE ONE SPRAY INTRANASAL EVERY TWO TO THREE MINUTES UNTIL EMERGENCY TEAM ARRIVES AS DIRECTED, USE IN OPOID EMERGENCY ONLY (Patient not taking: Reported on 06/18/2020)     oxyCODONE (ROXICODONE) 15 MG immediate release tablet TAKE ONE TABLET BY MOUTH EVERY 6 HOURS FOR 30 DAYS     pantoprazole (PROTONIX) 40 MG tablet Take 1 tablet (40 mg total) by mouth 2 (two) times daily. 180 tablet 0   rizatriptan (MAXALT-MLT) 10 MG disintegrating tablet Take 1 tablet (10 mg total) by mouth as needed for migraine. May repeat in 2 hours if needed 10 tablet 6   No facility-administered medications prior to visit.    Allergies  Allergen Reactions   Vancomycin Itching and Anaphylaxis    Face/back warm and red.  Pt lips and tongue swelled, hives   Demerol Hives and Nausea And Vomiting   Demerol  [Meperidine Hcl] Other (See Comments)   Meperidine Nausea And Vomiting and Hives   Microplegia Msa-Msg  [Plegisol]     Other reaction(s): headache   Monosodium Glutamate Nausea And Vomiting and Other (See Comments)    migraines Other reaction(s): headache   Tape Rash    Plastic tape    ROS As per HPI  PE: Vitals with BMI 02/02/2020 07/14/2019 01/27/2019  Height 5' 2.75" - 5' 2.75"  Weight 234 lbs 245 lbs 264 lbs 10 oz  BMI 16.10 - 96.04  Systolic 540 981 191  Diastolic 74 89 85  Pulse 64 - 62     ***  LABS:  Lab Results  Component Value Date   TSH 1.86 02/02/2020   Lab Results  Component Value Date   WBC 5.4 08/01/2020   HGB 7.3  Repeated and verified X2. (LL) 08/01/2020   HCT 24.8 (L) 08/01/2020   MCV 62.5 (L) 08/01/2020   PLT 210.0 08/01/2020   Lab Results  Component Value Date   IRON 15 (L) 08/01/2020   TIBC 407 08/01/2020   FERRITIN 6 (L) 08/01/2020   Lab Results  Component Value Date   VITAMINB12 828 03/21/2016   Lab Results  Component Value Date   CREATININE 0.64 08/01/2020   BUN 11 08/01/2020   NA 138 08/01/2020   K  4.3 08/01/2020   CL 103 08/01/2020   CO2 29 08/01/2020   Lab Results  Component Value Date   ALT 13 02/02/2020   AST 15 02/02/2020   ALKPHOS 60 02/02/2020   BILITOT 0.5 02/02/2020   Lab Results  Component Value Date   CHOL 189 02/02/2020   Lab Results  Component Value Date   HDL 53.70 02/02/2020   Lab Results  Component Value Date   LDLCALC 119 (H) 02/02/2020   Lab Results  Component Value Date   TRIG 80.0 02/02/2020   Lab Results  Component Value Date   CHOLHDL 4 02/02/2020   IMPRESSION AND PLAN:  No problem-specific Assessment & Plan notes found for this encounter.  No labs needed  An After Visit Summary was printed and given to the patient.  FOLLOW UP: No follow-ups on file.  Signed:  Crissie Sickles, MD           08/06/2020

## 2020-08-17 DIAGNOSIS — Z791 Long term (current) use of non-steroidal anti-inflammatories (NSAID): Secondary | ICD-10-CM | POA: Diagnosis not present

## 2020-08-17 DIAGNOSIS — E611 Iron deficiency: Secondary | ICD-10-CM | POA: Diagnosis not present

## 2020-08-17 DIAGNOSIS — D509 Iron deficiency anemia, unspecified: Secondary | ICD-10-CM | POA: Diagnosis not present

## 2020-08-19 LAB — BASIC METABOLIC PANEL
BUN: 13 (ref 4–21)
CO2: 32 — AB (ref 13–22)
Chloride: 103 (ref 99–108)
Creatinine: 0.7 (ref 0.5–1.1)
Glucose: 83
Potassium: 4 (ref 3.4–5.3)
Sodium: 140 (ref 137–147)

## 2020-08-19 LAB — CBC AND DIFFERENTIAL
HCT: 26 — AB (ref 36–46)
Hemoglobin: 7.6 — AB (ref 12.0–16.0)
Neutrophils Absolute: 4.6
Platelets: 265 (ref 150–399)
WBC: 7.2

## 2020-08-19 LAB — COMPREHENSIVE METABOLIC PANEL
Albumin: 3.7 (ref 3.5–5.0)
Calcium: 9.3 (ref 8.7–10.7)
GFR calc non Af Amer: 108

## 2020-08-19 LAB — CBC: RBC: 4.18 (ref 3.87–5.11)

## 2020-08-29 DIAGNOSIS — D509 Iron deficiency anemia, unspecified: Secondary | ICD-10-CM | POA: Diagnosis not present

## 2020-08-29 DIAGNOSIS — K449 Diaphragmatic hernia without obstruction or gangrene: Secondary | ICD-10-CM | POA: Diagnosis not present

## 2020-08-29 DIAGNOSIS — K635 Polyp of colon: Secondary | ICD-10-CM | POA: Diagnosis not present

## 2020-08-29 DIAGNOSIS — K5730 Diverticulosis of large intestine without perforation or abscess without bleeding: Secondary | ICD-10-CM | POA: Diagnosis not present

## 2020-08-29 DIAGNOSIS — D128 Benign neoplasm of rectum: Secondary | ICD-10-CM | POA: Diagnosis not present

## 2020-08-29 HISTORY — PX: ESOPHAGOGASTRODUODENOSCOPY: SHX1529

## 2020-08-29 LAB — HM COLONOSCOPY

## 2020-09-04 ENCOUNTER — Encounter: Payer: Self-pay | Admitting: Family Medicine

## 2020-09-05 ENCOUNTER — Other Ambulatory Visit (HOSPITAL_COMMUNITY): Payer: Self-pay | Admitting: *Deleted

## 2020-09-06 ENCOUNTER — Encounter (HOSPITAL_COMMUNITY)
Admission: RE | Admit: 2020-09-06 | Discharge: 2020-09-06 | Disposition: A | Discharge status: Home / Self Care | Payer: BC Managed Care – PPO | Source: Ambulatory Visit | Attending: Gastroenterology | Admitting: Gastroenterology

## 2020-09-06 DIAGNOSIS — D500 Iron deficiency anemia secondary to blood loss (chronic): Secondary | ICD-10-CM | POA: Diagnosis not present

## 2020-09-06 MED ORDER — SODIUM CHLORIDE 0.9 % IV SOLN
510.0000 mg | INTRAVENOUS | Status: DC
  Administered 2020-09-06: 510 mg via INTRAVENOUS
  Filled 2020-09-06: qty 510

## 2020-09-11 ENCOUNTER — Other Ambulatory Visit: Payer: Self-pay | Admitting: Family Medicine

## 2020-09-13 ENCOUNTER — Encounter (HOSPITAL_COMMUNITY)
Admission: RE | Admit: 2020-09-13 | Discharge: 2020-09-13 | Disposition: A | Discharge status: Home / Self Care | Payer: BC Managed Care – PPO | Source: Ambulatory Visit | Attending: Gastroenterology | Admitting: Gastroenterology

## 2020-09-13 ENCOUNTER — Other Ambulatory Visit: Payer: Self-pay

## 2020-09-13 DIAGNOSIS — D500 Iron deficiency anemia secondary to blood loss (chronic): Secondary | ICD-10-CM | POA: Diagnosis not present

## 2020-09-13 MED ORDER — FERUMOXYTOL INJECTION 510 MG/17 ML
510.0000 mg | INTRAVENOUS | Status: DC
  Administered 2020-09-13: 510 mg via INTRAVENOUS
  Filled 2020-09-13: qty 17

## 2020-09-28 ENCOUNTER — Encounter: Payer: Self-pay | Admitting: Family Medicine

## 2020-10-02 DIAGNOSIS — M47816 Spondylosis without myelopathy or radiculopathy, lumbar region: Secondary | ICD-10-CM | POA: Diagnosis not present

## 2020-10-08 ENCOUNTER — Encounter: Payer: Self-pay | Admitting: Family Medicine

## 2020-10-08 ENCOUNTER — Telehealth (INDEPENDENT_AMBULATORY_CARE_PROVIDER_SITE_OTHER): Payer: BC Managed Care – PPO | Admitting: Family Medicine

## 2020-10-08 DIAGNOSIS — R051 Acute cough: Secondary | ICD-10-CM

## 2020-10-08 DIAGNOSIS — D508 Other iron deficiency anemias: Secondary | ICD-10-CM

## 2020-10-08 DIAGNOSIS — K9089 Other intestinal malabsorption: Secondary | ICD-10-CM

## 2020-10-08 DIAGNOSIS — K219 Gastro-esophageal reflux disease without esophagitis: Secondary | ICD-10-CM

## 2020-10-08 NOTE — Progress Notes (Signed)
Virtual Visit via Video Note  I connected with Samantha Doyle on 10/08/20 at  1:00 PM EDT by telephone b/c a video enabled telemedicine application malfunctioned multiple times.  I verified that I am speaking with the correct person using two identifiers.  Location patient: home, [place] Location provider:work or home office Persons participating in the virtual visit: patient, provider  I discussed the limitations of evaluation and management by telemedicine and the availability of in person appointments. The patient expressed understanding and agreed to proceed.  Telemedicine visit is a necessity given the COVID-19 restrictions in place at the current time.  HPI: 45 y/o female being seen today for cough. Onset of cough 2 week ago.  She noted that onset was coincidental with running out of her zyrtec. She restarted her zyrtec about 1 wk ago and cough has improved significantly.  She got iron infusions x 2 for her IDA (9/1 and 9/8). Feeling a lot better!  Not chewing ice anymore.  Hemoccults were neg. Endoscopies were unrevealing.  She has regular menses w/out heavy bleeding.  Not taking oral iron supp b/c GI upset in the past. Takes PPI bid long term.  She stopped taking her PPI a week ago, bad GER sx's returned yesterday so she restarted pantoprazole at qd yesterday.  ROS: See pertinent positives and negatives per HPI.  Past Medical History:  Diagnosis Date   Allergic rhinitis    Arthritis    Carpal tunnel syndrome    right wrist, numb all the time   Cervical dysplasia    Chronic back pain    Degenerative lumbar spondylosis with grade 2 spondylolisthesis L4 on L5 with bilater pars defects L4.    COVID-19 virus infection 02/06/2020   DDD (degenerative disc disease), lumbar    MRI 10/2015: grade I (7mm) anterolisthesis L4 on L5, w/ disc herniation with encroachment on spinal nerves at L4-S1 levels.   Depression    GERD (gastroesophageal reflux disease)    worsened 07/2018 likely associated  with lap band being too constricting->increased pantoprazole to 40mg bid at that time.   History of chronic bronchitis    hx of when she was a smoker: ? mild intermittent asthma?--much better since stopped smoking 2018.   History of fracture of phalanx of finger    left, 4th finger distal phalanx   History of non anemic vitamin B12 deficiency    s/p lap band surgery per pt report.   Hyperlipidemia    Hypertension    off all bp meds x 3-4 years   Iron deficiency anemia 01/2019   hemoccults ordered but never turned in (01/2019-01/2020). Iron supp started 02/2020. Hemoccults neg 07/2020, plan for iron infusion. 08/2020 EGD with SB bx normal, no source of IDA seen on colonoscopy.   LAP-BAND surgery status    Pt getting lap band removed and is getting gastric sleave after.   Migraine syndrome    Ovarian cyst 10/2015   LEFT, 4 cm--noted on L spine MRI w/out contrast.  F/u u/s imaging showed simple cyst.   Spondylarthrosis    Tobacco dependence in remission    quit 2018    Past Surgical History:  Procedure Laterality Date   BACK SURGERY  2019   CARPAL TUNNEL RELEASE Left    CERVICAL CONE BIOPSY     COLONOSCOPY     2015 normal.  08/2020 (for IDA)->incomplete prep, no adenomas->rpt 5 yrs d/t incomplete prep.   ESOPHAGOGASTRODUODENOSCOPY N/A 04/27/2014   Procedure: ESOPHAGOGASTRODUODENOSCOPY (EGD);  Surgeon: David Newman, MD;    Location: [facility];  Service: General;  Laterality: N/A;   ESOPHAGOGASTRODUODENOSCOPY  08/29/2020   6 cm hiatal hernia noted, o/w normal, SB bx neg.  ?hernia contributing to pt's IDA?   GASTRIC BANDING PORT REVISION N/A 10/02/2014   Procedure: LAPROSCOPIC PLACEMENT OF LAP BAND PORT;  Surgeon: Benjamin Hoxworth, MD;  Location: [facility];  Service: General;  Laterality: N/A;   LAPAROSCOPIC GASTRIC BANDING  03/2007   [facility] - [facility]; Dr Tim Hipp   LAPAROSCOPIC REVISION OF GASTRIC BAND N/A 05/17/2012   Procedure: LAPAROSCOPIC REVISION OF SLIPPED GASTRIC  BAND;  Surgeon: Benjamin T Hoxworth, MD;  Location: [facility];  Service: General;  Laterality: N/A;   LAPAROSCOPY N/A 04/30/2014   Procedure: DIAGNOSTIC LAPAROSCOPY WITH REMOVAL OF INFECTED LAP BAND PORT WITH DEBRIDEMENT SUBCUTANEOUS ABSCESS;  Surgeon: Eric Wilson, MD;  Location: [facility];  Service: General;  Laterality: N/A;   VARICOSE VEIN SURGERY Bilateral 2009     Current Outpatient Medications:    albuterol (VENTOLIN HFA) 108 (90 Base) MCG/ACT inhaler, INHALE 2 PUFFS INTO THE LUNGS EVERY 4 (FOUR) HOURS AS NEEDED FOR WHEEZING OR SHORTNESS OF BREATH., Disp: 8 g, Rfl: 0   baclofen (LIORESAL) 10 MG tablet, Take 10 mg by mouth every 8 (eight) hours., Disp: , Rfl:    diclofenac (VOLTAREN) 75 MG EC tablet, Take 1 tablet by mouth 2 (two) times daily. , Disp: , Rfl:    DULoxetine (CYMBALTA) 60 MG capsule, Take 1 capsule (60 mg total) by mouth daily., Disp: 90 capsule, Rfl: 3   gabapentin (NEURONTIN) 800 MG tablet, Take 800 mg by mouth 3 (three) times daily., Disp: , Rfl: 6   lisinopril (ZESTRIL) 40 MG tablet, Take 1 tablet (40 mg total) by mouth daily., Disp: 90 tablet, Rfl: 3   Multiple Vitamin (MULITIVITAMIN WITH MINERALS) TABS, Take 1 tablet by mouth daily., Disp: , Rfl:    oxyCODONE (ROXICODONE) 15 MG immediate release tablet, TAKE ONE TABLET BY MOUTH EVERY 6 HOURS FOR 30 DAYS, Disp: , Rfl:    pantoprazole (PROTONIX) 40 MG tablet, TAKE 1 TABLET BY MOUTH TWICE A DAY, Disp: 60 tablet, Rfl: 0   rizatriptan (MAXALT-MLT) 10 MG disintegrating tablet, Take 1 tablet (10 mg total) by mouth as needed for migraine. May repeat in 2 hours if needed, Disp: 10 tablet, Rfl: 6   cetirizine (ZYRTEC) 10 MG tablet, Take 20 mg by mouth daily as needed for allergies (allergies).  (Patient not taking: Reported on 10/08/2020), Disp: , Rfl:    clonazePAM (KLONOPIN) 0.5 MG tablet, TAKE 1 TABLET BY MOUTH TWICE A DAY AS NEEDED (Patient not taking: No sig reported), Disp: 180 tablet, Rfl: 0   NARCAN 4 MG/0.1ML LIQD nasal spray  kit, USE ONE SPRAY INTRANASAL EVERY TWO TO THREE MINUTES UNTIL EMERGENCY TEAM ARRIVES AS DIRECTED, USE IN OPOID EMERGENCY ONLY (Patient not taking: No sig reported), Disp: , Rfl:   EXAM:  VITALS per patient if applicable:  Vitals with BMI 09/13/2020 09/13/2020 09/06/2020  Height - - -  Weight - - -  BMI - - -  Systolic 124 142 122  Diastolic 85 85 81  Pulse 55 57 55      GENERAL: alert, oriented, sounds well and sounds in no acute distress No further exam b/c audio visit only.  LABS: none today    Chemistry      Component Value Date/Time   NA 140 08/19/2020 0000   K 4.0 08/19/2020 0000   CL 103 08/19/2020 0000     CO2 32 (A) 08/19/2020 0000   BUN 13 08/19/2020 0000   CREATININE 0.7 08/19/2020 0000   CREATININE 0.64 08/01/2020 1333   CREATININE 0.61 08/07/2017 1508   GLU 83 08/19/2020 0000      Component Value Date/Time   CALCIUM 9.3 08/19/2020 0000   ALKPHOS 60 02/02/2020 1500   AST 15 02/02/2020 1500   ALT 13 02/02/2020 1500   BILITOT 0.5 02/02/2020 1500  2)     ASSESSMENT AND PLAN:  Discussed the following assessment and plan:  1) Cough, suspect allergy-related. Doing much better since getting back on daily zyrtec.  2) IDA, suspect this is d/t inadequate intake + malabsorption d/t chronic high dose PPI therapy (GI eval unremarkable, no abnormal uterine bleeding. She feels MUCH better since getting iron infusions. Make lab appt next week (approx 1 mo after last infusion) to check cbc and iron panel. I recommended she try taking a low dose otc iron supp qod to see if she tolerates this. If she ever gets off PPI then we can try going off supplemental iron.  3) GERD: was on PPI bid long term. Recent flare of sx's after taking herself completely off PPI. She has resumed the med (pantoprazole) and wants to try just staying on qd dosing.  I discussed the assessment and treatment plan with the patient. The patient was provided an opportunity to ask questions and all were  answered. The patient agreed with the plan and demonstrated an understanding of the instructions.   Spent 20 min with pt today reviewing HPI, reviewing relevant past history, doing exam, reviewing and discussing lab and imaging data, and formulating plans.  F/u: if not improving.  Signed:  Phil McGowen, MD           10/08/2020  

## 2020-10-13 ENCOUNTER — Other Ambulatory Visit: Payer: Self-pay | Admitting: Family Medicine

## 2020-10-22 DIAGNOSIS — M5136 Other intervertebral disc degeneration, lumbar region: Secondary | ICD-10-CM | POA: Diagnosis not present

## 2020-10-22 DIAGNOSIS — G894 Chronic pain syndrome: Secondary | ICD-10-CM | POA: Diagnosis not present

## 2020-10-22 DIAGNOSIS — Z79891 Long term (current) use of opiate analgesic: Secondary | ICD-10-CM | POA: Diagnosis not present

## 2020-10-22 DIAGNOSIS — M4726 Other spondylosis with radiculopathy, lumbar region: Secondary | ICD-10-CM | POA: Diagnosis not present

## 2020-10-22 DIAGNOSIS — M791 Myalgia, unspecified site: Secondary | ICD-10-CM | POA: Diagnosis not present

## 2020-10-22 DIAGNOSIS — M5106 Intervertebral disc disorders with myelopathy, lumbar region: Secondary | ICD-10-CM | POA: Diagnosis not present

## 2020-11-11 ENCOUNTER — Other Ambulatory Visit: Payer: Self-pay | Admitting: Family Medicine

## 2020-11-26 ENCOUNTER — Other Ambulatory Visit: Payer: Self-pay | Admitting: Family Medicine

## 2020-11-30 ENCOUNTER — Other Ambulatory Visit: Payer: Self-pay | Admitting: Family Medicine

## 2020-12-10 ENCOUNTER — Other Ambulatory Visit: Payer: Self-pay

## 2020-12-10 ENCOUNTER — Ambulatory Visit (INDEPENDENT_AMBULATORY_CARE_PROVIDER_SITE_OTHER): Payer: BC Managed Care – PPO | Admitting: Family Medicine

## 2020-12-10 ENCOUNTER — Other Ambulatory Visit (HOSPITAL_COMMUNITY)
Admission: RE | Admit: 2020-12-10 | Discharge: 2020-12-10 | Disposition: A | Discharge status: Home / Self Care | Payer: BC Managed Care – PPO | Source: Ambulatory Visit | Attending: Family Medicine | Admitting: Family Medicine

## 2020-12-10 ENCOUNTER — Encounter: Payer: Self-pay | Admitting: Family Medicine

## 2020-12-10 VITALS — BP 120/82 | HR 117 | Temp 102.9°F | Ht 62.75 in | Wt 249.0 lb

## 2020-12-10 DIAGNOSIS — N939 Abnormal uterine and vaginal bleeding, unspecified: Secondary | ICD-10-CM | POA: Insufficient documentation

## 2020-12-10 DIAGNOSIS — D509 Iron deficiency anemia, unspecified: Secondary | ICD-10-CM

## 2020-12-10 DIAGNOSIS — Z1231 Encounter for screening mammogram for malignant neoplasm of breast: Secondary | ICD-10-CM | POA: Diagnosis not present

## 2020-12-10 DIAGNOSIS — R5081 Fever presenting with conditions classified elsewhere: Secondary | ICD-10-CM

## 2020-12-10 DIAGNOSIS — J111 Influenza due to unidentified influenza virus with other respiratory manifestations: Secondary | ICD-10-CM

## 2020-12-10 LAB — POCT URINE PREGNANCY: Preg Test, Ur: NEGATIVE

## 2020-12-10 MED ORDER — OSELTAMIVIR PHOSPHATE 75 MG PO CAPS: ORAL_CAPSULE | ORAL | 0 refills | Status: DC

## 2020-12-10 NOTE — Progress Notes (Signed)
See student note for this encounter. I have attested it. Signed:  Phil Aeralyn Barna, MD           12/11/2020  

## 2020-12-10 NOTE — Progress Notes (Addendum)
OFFICE VISIT  12/10/2020  CC:  Chief Complaint  Patient presents with   Vaginal bleeding    Pt also c/o breast tenderness but has stopped since starting period on 11/26. Has completed 2 pregnancy tests, negative. Still spotting but also c/o chills starting this morning.    HPI:    Patient is a 45 y.o. female who presents for vaginal bleeding.   Has regular interval menses, usually light to moderate bleeding a few days, usually with cramps.  She now reports dark spotting that has occurred since last week (onset a few days prior to when she expected her period) that has progressed to the point where she needs 8 tampons a day last few days, has no cramping but has breast soreness and feeling of bilat engorgement.  Recent home pregnancy test is negative.  PMH sx for cervical cone biopsy in 2001, last pap smear sometime around 2016, and possible mammogram 2 years ago. She also reports chills and muscle aches.   Ros negative (pelvic pain, vag d/c,  pelvic fullness, dyschezia, cough, congestion). She was noted to have fever upon check in today---102.  Feeling achy and tired today and hot/cold but no cough, URI sx's, HA, or ST.  Mild nausea w/out vomiting.  No diarrhea. No recent known sick contacts.   Past Medical History:  Diagnosis Date   Allergic rhinitis    Arthritis    Carpal tunnel syndrome    right wrist, numb all the time   Cervical dysplasia    Chronic back pain    Degenerative lumbar spondylosis with grade 2 spondylolisthesis L4 on L5 with bilater pars defects L4.    COVID-19 virus infection 02/06/2020   DDD (degenerative disc disease), lumbar    MRI 10/2015: grade I (15m) anterolisthesis L4 on L5, w/ disc herniation with encroachment on spinal nerves at L4-S1 levels.   Depression    GERD (gastroesophageal reflux disease)    worsened 07/2018 likely associated with lap band being too constricting->increased pantoprazole to 450mbid at that time.   History of chronic bronchitis     hx of when she was a smoker: ? mild intermittent asthma?--much better since stopped smoking 2018.   History of fracture of phalanx of finger    left, 4th finger distal phalanx   History of non anemic vitamin B12 deficiency    s/p lap band surgery per pt report.   Hyperlipidemia    Hypertension    off all bp meds x 3-4 years   Iron deficiency anemia 01/2019   hemoccults ordered but never turned in (01/2019-01/2020). Iron supp started 02/2020. Hemoccults neg 07/2020, plan for iron infusion. 08/2020 EGD with SB bx normal, no source of IDA seen on colonoscopy. Iron infusion 09/06/2020   LAP-BAND surgery status    Pt getting lap band removed and is getting gastric sleave after.   Migraine syndrome    Ovarian cyst 10/2015   LEFT, 4 cm--noted on L spine MRI w/out contrast.  F/u u/s imaging showed simple cyst.   Spondylarthrosis    Tobacco dependence in remission    quit 2018    Past Surgical History:  Procedure Laterality Date   BACK SURGERY  2019   CARPAL TUNNEL RELEASE Left    CERVICAL CONE BIOPSY     COLONOSCOPY     2015 normal.  08/2020 (for IDA)->incomplete prep, no adenomas->rpt 5 yrs d/t incomplete prep.   ESOPHAGOGASTRODUODENOSCOPY N/A 04/27/2014   Procedure: ESOPHAGOGASTRODUODENOSCOPY (EGD);  Surgeon: DaAlphonsa OverallMD;  [facility];  Service: General;  Laterality: N/A;   ESOPHAGOGASTRODUODENOSCOPY  08/29/2020   6 cm hiatal hernia noted, o/w normal, SB bx neg.  ?hernia contributing to pt's IDA?   GASTRIC BANDING PORT REVISION N/A 10/02/2014   Procedure: LAPROSCOPIC PLACEMENT OF LAP BAND PORT;  Surgeon: Excell Seltzer, MD;  Location: [facility];  Service: General;  Laterality: N/A;   LAPAROSCOPIC GASTRIC BANDING  03/2007   [facility] - [facility]; Dr Octavia Bruckner Hipp   LAPAROSCOPIC REVISION OF GASTRIC BAND N/A 05/17/2012   Procedure: LAPAROSCOPIC REVISION OF SLIPPED GASTRIC BAND;  Surgeon: Edward Jolly, MD;  Location: [facility];  Service: General;  Laterality: N/A;    LAPAROSCOPY N/A 04/30/2014   Procedure: DIAGNOSTIC LAPAROSCOPY WITH REMOVAL OF INFECTED LAP BAND PORT WITH DEBRIDEMENT SUBCUTANEOUS ABSCESS;  Surgeon: Greer Pickerel, MD;  Location: [facility];  Service: General;  Laterality: N/A;   VARICOSE VEIN SURGERY Bilateral 2009    Outpatient Medications Prior to Visit  Medication Sig Dispense Refill   albuterol (VENTOLIN HFA) 108 (90 Base) MCG/ACT inhaler INHALE 2 PUFFS INTO THE LUNGS EVERY 4 (FOUR) HOURS AS NEEDED FOR WHEEZING OR SHORTNESS OF BREATH. 8 g 0   baclofen (LIORESAL) 10 MG tablet Take 10 mg by mouth every 8 (eight) hours.     diclofenac (VOLTAREN) 75 MG EC tablet Take 1 tablet by mouth 2 (two) times daily.      DULoxetine (CYMBALTA) 60 MG capsule Take 1 capsule (60 mg total) by mouth daily. 90 capsule 3   gabapentin (NEURONTIN) 800 MG tablet Take 800 mg by mouth 3 (three) times daily.  6   lisinopril (ZESTRIL) 40 MG tablet TAKE 1 TABLET BY MOUTH EVERY DAY 30 tablet 0   Multiple Vitamin (MULITIVITAMIN WITH MINERALS) TABS Take 1 tablet by mouth daily.     oxyCODONE (ROXICODONE) 15 MG immediate release tablet TAKE ONE TABLET BY MOUTH EVERY 6 HOURS FOR 30 DAYS     pantoprazole (PROTONIX) 40 MG tablet Take 1 tablet (40 mg total) by mouth 2 (two) times daily. OFFICE VISIT NEEDED FOR FURTHER REFILLS 60 tablet 0   rizatriptan (MAXALT-MLT) 10 MG disintegrating tablet Take 1 tablet (10 mg total) by mouth as needed for migraine. May repeat in 2 hours if needed 10 tablet 6   cetirizine (ZYRTEC) 10 MG tablet Take 20 mg by mouth daily as needed for allergies (allergies).  (Patient not taking: Reported on 10/08/2020)     clonazePAM (KLONOPIN) 0.5 MG tablet TAKE 1 TABLET BY MOUTH TWICE A DAY AS NEEDED (Patient not taking: Reported on 12/10/2020) 180 tablet 0   NARCAN 4 MG/0.1ML LIQD nasal spray kit USE ONE SPRAY INTRANASAL EVERY TWO TO THREE MINUTES UNTIL EMERGENCY TEAM ARRIVES AS DIRECTED, USE IN OPOID EMERGENCY ONLY (Patient not taking: Reported on 06/18/2020)      No facility-administered medications prior to visit.    Allergies  Allergen Reactions   Vancomycin Itching and Anaphylaxis    Face/back warm and red.  Pt lips and tongue swelled, hives   Demerol Hives and Nausea And Vomiting   Demerol  [Meperidine Hcl] Other (See Comments)   Meperidine Nausea And Vomiting and Hives   Microplegia Msa-Msg  [Plegisol]     Other reaction(s): headache   Monosodium Glutamate Nausea And Vomiting and Other (See Comments)    migraines Other reaction(s): headache   Tape Rash    Plastic tape    ROS As per HPI  PE: Vitals with BMI 12/10/2020 09/13/2020 09/13/2020  Height 5' 2.75" - -  Weight 249 lbs - -  BMI 51.70 - -  Systolic 017 494 496  Diastolic 82 85 85  Pulse 759 55 57   Physical Exam Constitutional:      General: She is not in acute distress.    Appearance: Normal appearance. She is not ill-appearing.  Neurological:     Mental Status: She is alert.    Pelvic exam: normal external genitalia, vulva, vagina, cervix, uterus and adnexa.  Small amount of blood in vaginal vault.  No CMT. PAP: Pap smear done today.   LABS:    Chemistry      Component Value Date/Time   NA 140 08/19/2020 0000   K 4.0 08/19/2020 0000   CL 103 08/19/2020 0000   CO2 32 (A) 08/19/2020 0000   BUN 13 08/19/2020 0000   CREATININE 0.7 08/19/2020 0000   CREATININE 0.64 08/01/2020 1333   CREATININE 0.61 08/07/2017 1508   GLU 83 08/19/2020 0000      Component Value Date/Time   CALCIUM 9.3 08/19/2020 0000   ALKPHOS 60 02/02/2020 1500   AST 15 02/02/2020 1500   ALT 13 02/02/2020 1500   BILITOT 0.5 02/02/2020 1500     Lab Results  Component Value Date   WBC 7.2 08/19/2020   HGB 7.6 (A) 08/19/2020   HCT 26 (A) 08/19/2020   MCV 62.5 (L) 08/01/2020   PLT 265 08/19/2020   Lab Results  Component Value Date   IRON 15 (L) 08/01/2020   TIBC 407 08/01/2020   FERRITIN 6 (L) 08/01/2020   IMPRESSION AND PLAN: Non-toxic appearing female whop presents for abnormal  uterine bleeding. Given patients history of abnormal bleeding, negative pregnancy test, abnormal pap smears, breast tenderness, and normal pelvic exam my differential includes endometrial hyperplasia, uterine fibroid.  Ordering LH, FSH, cbc, iron.  UPT neg here today. Pelvic u/s ordered today. Referring patient to Gynecology.   Patient was prescribed tamiflu given she presented with flu-like symptoms (chills, muscle aches, fever, onset today).  Lab Orders         CBC with Differential/Platelet         Iron, TIBC and Ferritin Panel         Luteinizing hormone         FSH         Basic metabolic panel         POCT urine pregnancy      An After Visit Summary was printed and given to the patient.  FOLLOW UP: f/u to be determined based on results of w/u and GYN eval.  Phil Dopp -MS3  Signed:  Crissie Sickles, MD           12/10/2020

## 2020-12-11 LAB — CBC WITH DIFFERENTIAL/PLATELET
Basophils Absolute: 0 10*3/uL (ref 0.0–0.1)
Basophils Relative: 0.4 % (ref 0.0–3.0)
Eosinophils Absolute: 0 10*3/uL (ref 0.0–0.7)
Eosinophils Relative: 0.2 % (ref 0.0–5.0)
HCT: 36.3 % (ref 36.0–46.0)
Hemoglobin: 11.4 g/dL — ABNORMAL LOW (ref 12.0–15.0)
Lymphocytes Relative: 4.8 % — ABNORMAL LOW (ref 12.0–46.0)
Lymphs Abs: 0.5 10*3/uL — ABNORMAL LOW (ref 0.7–4.0)
MCHC: 31.4 g/dL (ref 30.0–36.0)
MCV: 80.6 fl (ref 78.0–100.0)
Monocytes Absolute: 0.7 10*3/uL (ref 0.1–1.0)
Monocytes Relative: 5.8 % (ref 3.0–12.0)
Neutro Abs: 10 10*3/uL — ABNORMAL HIGH (ref 1.4–7.7)
Neutrophils Relative %: 88.8 % — ABNORMAL HIGH (ref 43.0–77.0)
Platelets: 157 10*3/uL (ref 150.0–400.0)
RBC: 4.5 Mil/uL (ref 3.87–5.11)
RDW: 18.4 % — ABNORMAL HIGH (ref 11.5–15.5)
WBC: 11.3 10*3/uL — ABNORMAL HIGH (ref 4.0–10.5)

## 2020-12-11 LAB — HCG, TOTAL, QUANTITATIVE: hCG, Beta Chain, Quant, S: <3 m[IU]/mL

## 2020-12-11 LAB — BASIC METABOLIC PANEL
BUN: 10 mg/dL (ref 6–23)
CO2: 30 mEq/L (ref 19–32)
Calcium: 8.7 mg/dL (ref 8.4–10.5)
Chloride: 100 mEq/L (ref 96–112)
Creatinine, Ser: 0.65 mg/dL (ref 0.40–1.20)
GFR: 105.27 mL/min (ref >60.00)
Glucose, Bld: 73 mg/dL (ref 70–99)
Potassium: 3.3 mEq/L — ABNORMAL LOW (ref 3.5–5.1)
Sodium: 138 mEq/L (ref 135–145)

## 2020-12-11 LAB — IRON,TIBC AND FERRITIN PANEL
%SAT: 11 % (calc) — ABNORMAL LOW (ref 16–45)
Ferritin: 9 ng/mL — ABNORMAL LOW (ref 16–232)
Iron: 40 ug/dL (ref 40–190)
TIBC: 376 mcg/dL (calc) (ref 250–450)

## 2020-12-12 DIAGNOSIS — M5106 Intervertebral disc disorders with myelopathy, lumbar region: Secondary | ICD-10-CM | POA: Diagnosis not present

## 2020-12-12 DIAGNOSIS — M4726 Other spondylosis with radiculopathy, lumbar region: Secondary | ICD-10-CM | POA: Diagnosis not present

## 2020-12-12 DIAGNOSIS — Z79891 Long term (current) use of opiate analgesic: Secondary | ICD-10-CM | POA: Diagnosis not present

## 2020-12-12 DIAGNOSIS — M5136 Other intervertebral disc degeneration, lumbar region: Secondary | ICD-10-CM | POA: Diagnosis not present

## 2020-12-12 DIAGNOSIS — M791 Myalgia, unspecified site: Secondary | ICD-10-CM | POA: Diagnosis not present

## 2020-12-12 DIAGNOSIS — G894 Chronic pain syndrome: Secondary | ICD-10-CM | POA: Diagnosis not present

## 2020-12-12 LAB — LUTEINIZING HORMONE: LH: 1.51 m[IU]/mL

## 2020-12-12 LAB — FOLLICLE STIMULATING HORMONE: FSH: 5.8 m[IU]/mL

## 2020-12-13 ENCOUNTER — Telehealth: Payer: Self-pay | Admitting: Family Medicine

## 2020-12-13 LAB — CYTOLOGY - PAP
Comment: NEGATIVE
Diagnosis: NEGATIVE
High risk HPV: NEGATIVE

## 2020-12-13 NOTE — Progress Notes (Signed)
Noted  

## 2020-12-13 NOTE — Telephone Encounter (Signed)
Spoke with pt regarding results/recommendations,voiced understanding. ? ?

## 2020-12-17 ENCOUNTER — Ambulatory Visit: Admission: RE | Admit: 2020-12-17 | Payer: BC Managed Care – PPO | Source: Ambulatory Visit

## 2020-12-19 ENCOUNTER — Other Ambulatory Visit: Payer: Self-pay | Admitting: Family Medicine

## 2021-02-13 DIAGNOSIS — G894 Chronic pain syndrome: Secondary | ICD-10-CM | POA: Diagnosis not present

## 2021-02-13 DIAGNOSIS — Z79891 Long term (current) use of opiate analgesic: Secondary | ICD-10-CM | POA: Diagnosis not present

## 2021-02-13 DIAGNOSIS — M4726 Other spondylosis with radiculopathy, lumbar region: Secondary | ICD-10-CM | POA: Diagnosis not present

## 2021-02-13 DIAGNOSIS — M791 Myalgia, unspecified site: Secondary | ICD-10-CM | POA: Diagnosis not present

## 2021-02-13 DIAGNOSIS — M5136 Other intervertebral disc degeneration, lumbar region: Secondary | ICD-10-CM | POA: Diagnosis not present

## 2021-02-13 DIAGNOSIS — M961 Postlaminectomy syndrome, not elsewhere classified: Secondary | ICD-10-CM | POA: Diagnosis not present

## 2021-02-13 DIAGNOSIS — M5106 Intervertebral disc disorders with myelopathy, lumbar region: Secondary | ICD-10-CM | POA: Diagnosis not present

## 2021-02-14 ENCOUNTER — Other Ambulatory Visit: Payer: Self-pay | Admitting: Family Medicine

## 2021-03-04 ENCOUNTER — Other Ambulatory Visit: Payer: Self-pay

## 2021-03-04 ENCOUNTER — Telehealth (INDEPENDENT_AMBULATORY_CARE_PROVIDER_SITE_OTHER): Payer: BC Managed Care – PPO | Admitting: Family Medicine

## 2021-03-04 ENCOUNTER — Encounter: Payer: Self-pay | Admitting: Family Medicine

## 2021-03-04 DIAGNOSIS — F902 Attention-deficit hyperactivity disorder, combined type: Secondary | ICD-10-CM

## 2021-03-04 DIAGNOSIS — F988 Other specified behavioral and emotional disorders with onset usually occurring in childhood and adolescence: Secondary | ICD-10-CM | POA: Diagnosis not present

## 2021-03-04 MED ORDER — METHYLPHENIDATE HCL ER (OSM) 18 MG PO TBCR: 18.0000 mg | EXTENDED_RELEASE_TABLET | Freq: Every day | ORAL | 0 refills | Status: DC

## 2021-03-04 NOTE — Progress Notes (Signed)
Virtual Visit via Video Note  I connected with Samantha Doyle on 03/04/21 at 11:30 AM EST by a video enabled telemedicine application and verified that I am speaking with the correct person using two identifiers.  Location patient: Samantha Doyle Location provider:work or home office Persons participating in the virtual visit: patient, provider  I discussed the limitations and requested verbal permission for telemedicine visit. The patient expressed understanding and agreed to proceed.  HPI: 46 y/o female being seen today for decreased focus, poor concentration.  She reports a long history, since childhood in fact, of inattentive tendencies, easily distractible, easily frustrated with complex tasks, not finishing things/projects.  In general, she uses the words "scatterbrained".  Recent issues at work stemming around her difficulties completing tasks. Turns out her boss has been gradually letting her know in little bits that she has these problems and they are concerned.   She is quite anxious today. Has to collect herself a couple times in the midst of her explanations today. When asked about possible manic symptoms she states that she is not having any euphoria. States she takes Cymbalta every day and still has some mild mood swings but she feels like these current symptoms are the way she has been her entire life. Describes several family members with the same symptoms who got diagnosed in adulthood and are taking stimulants. She has been self treating with caffeine--monster drinks.  When the started hurting her stomach she cut back on these and started adding caffeine tabs.  Drinks a lot of caffeine during the week when she has to work and then on weekends stops and notes remarkable return of symptoms. No illicit drug use.  No alcohol.  Her ADD symptoms definitely have affected her relationship with her husband, other social situations, and work environment as stated above.  ROS as above, plus--> no fevers,  no CP, no SOB, no wheezing, no cough, no dizziness, notes a headache after taking caffeine tabs but it resolves in a couple hours without treatment., no rashes, no melena/hematochezia.  No polyuria or polydipsia.  No myalgias or arthralgias.  No focal weakness, paresthesias, or tremors.  No acute vision or hearing abnormalities.  No dysuria or unusual/new urinary urgency or frequency.  No recent changes in lower legs. No n/v/d or abd pain.  No palpitations.    Allergies  Allergen Reactions   Vancomycin Itching and Anaphylaxis    Face/back warm and red.  Pt lips and tongue swelled, hives   Demerol Hives and Nausea And Vomiting   Demerol  [Meperidine Hcl] Other (See Comments)   Meperidine Nausea And Vomiting and Hives   Microplegia Msa-Msg  [Plegisol]     Other reaction(s): headache   Monosodium Glutamate Nausea And Vomiting and Other (See Comments)    migraines Other reaction(s): headache   Tape Rash    Plastic tape   -COVID-19 vaccine status:  Immunization History  Administered Date(s) Administered   Influenza Split 09/11/2014, 09/07/2018, 10/03/2019   Influenza,inj,Quad PF,6+ Mos 09/11/2014, 10/09/2020   Influenza-Unspecified 09/11/2014, 10/07/2015, 09/07/2018   PFIZER(Purple Top)SARS-COV-2 Vaccination 07/26/2019, 08/16/2019   Tdap 08/07/2012     ROS: See pertinent positives and negatives per HPI.  Past Medical History:  Diagnosis Date   Allergic rhinitis    Arthritis    Carpal tunnel syndrome    right wrist, numb all the time   Cervical dysplasia    Chronic back pain    Degenerative lumbar spondylosis with grade 2 spondylolisthesis L4 on L5 with bilater pars defects L4.  COVID-19 virus infection 02/06/2020   DDD (degenerative disc disease), lumbar    MRI 10/2015: grade I (33mm) anterolisthesis L4 on L5, w/ disc herniation with encroachment on spinal nerves at L4-S1 levels.   Depression    GERD (gastroesophageal reflux disease)    worsened 07/2018 likely associated with  lap band being too constricting->increased pantoprazole to $RemoveBeforeD'40mg'[id number]$  bid at that time.   History of chronic bronchitis    hx of when she was a smoker: ? mild intermittent asthma?--much better since stopped smoking 2018.   History of fracture of phalanx of finger    left, 4th finger distal phalanx   History of non anemic vitamin B12 deficiency    s/p lap band surgery per pt report.   Hyperlipidemia    Hypertension    off all bp meds x 3-4 years   Iron deficiency anemia 01/2019   hemoccults ordered but never turned in (01/2019-01/2020). Iron supp started 02/2020. Hemoccults neg 07/2020, plan for iron infusion. 08/2020 EGD with SB bx normal, no source of IDA seen on colonoscopy. Iron infusion 09/06/2020   LAP-BAND surgery status    Pt getting lap band removed and is getting gastric sleave after.   Migraine syndrome    Ovarian cyst 10/2015   LEFT, 4 cm--noted on L spine MRI w/out contrast.  F/u u/s imaging showed simple cyst.   Spondylarthrosis    Tobacco dependence in remission    quit 2018    Past Surgical History:  Procedure Laterality Date   BACK SURGERY  2019   CARPAL TUNNEL RELEASE Left    CERVICAL CONE BIOPSY     COLONOSCOPY     2015 normal.  08/2020 (for IDA)->incomplete prep, no adenomas->rpt 5 yrs d/t incomplete prep.   ESOPHAGOGASTRODUODENOSCOPY N/A 04/27/2014   Procedure: ESOPHAGOGASTRODUODENOSCOPY (EGD);  Surgeon: Alphonsa Overall, MD;  Location: [facility];  Service: General;  Laterality: N/A;   ESOPHAGOGASTRODUODENOSCOPY  08/29/2020   6 cm hiatal hernia noted, o/w normal, SB bx neg.  ?hernia contributing to pt's IDA?   GASTRIC BANDING PORT REVISION N/A 10/02/2014   Procedure: LAPROSCOPIC PLACEMENT OF LAP BAND PORT;  Surgeon: Excell Seltzer, MD;  Location: [facility];  Service: General;  Laterality: N/A;   LAPAROSCOPIC GASTRIC BANDING  03/2007   [facility] - [facility]; Dr Octavia Bruckner Hipp   LAPAROSCOPIC REVISION OF GASTRIC BAND N/A 05/17/2012   Procedure: LAPAROSCOPIC REVISION  OF SLIPPED GASTRIC BAND;  Surgeon: Edward Jolly, MD;  Location: [facility];  Service: General;  Laterality: N/A;   LAPAROSCOPY N/A 04/30/2014   Procedure: DIAGNOSTIC LAPAROSCOPY WITH REMOVAL OF INFECTED LAP BAND PORT WITH DEBRIDEMENT SUBCUTANEOUS ABSCESS;  Surgeon: Greer Pickerel, MD;  Location: [facility];  Service: General;  Laterality: N/A;   VARICOSE VEIN SURGERY Bilateral 2009     Current Outpatient Medications:    albuterol (VENTOLIN HFA) 108 (90 Base) MCG/ACT inhaler, INHALE 2 PUFFS INTO THE LUNGS EVERY 4 (FOUR) HOURS AS NEEDED FOR WHEEZING OR SHORTNESS OF BREATH., Disp: 8 g, Rfl: 0   baclofen (LIORESAL) 10 MG tablet, Take 10 mg by mouth every 8 (eight) hours., Disp: , Rfl:    cetirizine (ZYRTEC) 10 MG tablet, Take 20 mg by mouth daily as needed for allergies (allergies)., Disp: , Rfl:    diclofenac (VOLTAREN) 75 MG EC tablet, Take 1 tablet by mouth 2 (two) times daily. , Disp: , Rfl:    DULoxetine (CYMBALTA) 60 MG capsule, Take 1 capsule (60 mg total) by mouth daily., Disp: 90 capsule, Rfl: 3  gabapentin (NEURONTIN) 800 MG tablet, Take 800 mg by mouth 3 (three) times daily., Disp: , Rfl: 6   lisinopril (ZESTRIL) 40 MG tablet, TAKE 1 TABLET BY MOUTH EVERY DAY, Disp: 90 tablet, Rfl: 0   Multiple Vitamin (MULITIVITAMIN WITH MINERALS) TABS, Take 1 tablet by mouth daily., Disp: , Rfl:    oxyCODONE (ROXICODONE) 15 MG immediate release tablet, TAKE ONE TABLET BY MOUTH EVERY 6 HOURS FOR 30 DAYS, Disp: , Rfl:    pantoprazole (PROTONIX) 40 MG tablet, TAKE 1 TABLET (40 MG TOTAL) BY MOUTH 2 (TWO) TIMES DAILY. OFFICE VISIT NEEDED FOR FURTHER REFILLS, Disp: 180 tablet, Rfl: 0   rizatriptan (MAXALT-MLT) 10 MG disintegrating tablet, Take 1 tablet (10 mg total) by mouth as needed for migraine. May repeat in 2 hours if needed, Disp: 10 tablet, Rfl: 6   clonazePAM (KLONOPIN) 0.5 MG tablet, TAKE 1 TABLET BY MOUTH TWICE A DAY AS NEEDED (Patient not taking: Reported on 12/10/2020), Disp: 180 tablet, Rfl: 0    NARCAN 4 MG/0.1ML LIQD nasal spray kit, USE ONE SPRAY INTRANASAL EVERY TWO TO THREE MINUTES UNTIL EMERGENCY TEAM ARRIVES AS DIRECTED, USE IN OPOID EMERGENCY ONLY (Patient not taking: Reported on 06/18/2020), Disp: , Rfl:   EXAM:  VITALS per patient if applicable:  Vitals with BMI 12/10/2020 09/13/2020 09/13/2020  Height 5' 2.75" - -  Weight 249 lbs - -  BMI 12.45 - -  Systolic 809 983 382  Diastolic 82 85 85  Pulse 505 55 57     GENERAL: alert, oriented, appears well and in no acute distress  HEENT: atraumatic, conjunttiva clear, no obvious abnormalities on inspection of external nose and ears  NECK: normal movements of the head and neck  LUNGS: on inspection no signs of respiratory distress, breathing rate appears normal, no obvious gross SOB, gasping or wheezing  CV: no obvious cyanosis  MS: moves all visible extremities without noticeable abnormality  PSYCH/NEURO: pleasant and cooperative, no obvious depression or anxiety, speech and thought processing grossly intact  LABS: none today    Chemistry      Component Value Date/Time   NA 138 12/10/2020 1650   NA 140 08/19/2020 0000   K 3.3 (L) 12/10/2020 1650   CL 100 12/10/2020 1650   CO2 30 12/10/2020 1650   BUN 10 12/10/2020 1650   BUN 13 08/19/2020 0000   CREATININE 0.65 12/10/2020 1650   CREATININE 0.61 08/07/2017 1508   GLU 83 08/19/2020 0000      Component Value Date/Time   CALCIUM 8.7 12/10/2020 1650   ALKPHOS 60 02/02/2020 1500   AST 15 02/02/2020 1500   ALT 13 02/02/2020 1500   BILITOT 0.5 02/02/2020 1500     Lab Results  Component Value Date   WBC 11.3 (H) 12/10/2020   HGB 11.4 (L) 12/10/2020   HCT 36.3 12/10/2020   MCV 80.6 12/10/2020   PLT 157.0 12/10/2020   ASSESSMENT AND PLAN:  Discussed the following assessment and plan:  #1 ADHD suspect combined type.  Symptoms since childhood.  This has affected her work and relationship with her husband has her very anxious, worried she might lose her  job. I am certainly keeping bipolar disorder in mind but feel like this is less likely.  I mentioned this diagnosis to her today and she notes that her father was diagnosed with bipolar disorder.  We discussed the importance of her letting me know if this stimulant makes her worse, and even if it does not make her worse, we  may have to revisit the diagnosis of bipolar disorder.  We will start generic Concerta 18 mg a day today.  We will recheck in a week and possibly increase to 36 mg a day at that time.  Continue duloxetine 60 mg a day. She does not take the clonazepam anymore but she has this around in case she needs it. If she does stay on stimulant medication we will do the appropriate controlled substance contract and urine drug screening.    I need to ask her about the situation with the vaginal bleeding that I saw her for in December 2022 (I saw her 12/10/2020 for vaginal bleeding.  I ordered pelvic ultrasound and she has not been able to get this yet.   We did not get to discuss this topic today.)  I discussed the assessment and treatment plan with the patient. The patient was provided an opportunity to ask questions and all were answered. The patient agreed with the plan and demonstrated an understanding of the instructions.   F/u: 1 wk virtual visit f/u add/med  Signed:  Crissie Sickles, MD           03/04/2021

## 2021-03-06 DIAGNOSIS — M5136 Other intervertebral disc degeneration, lumbar region: Secondary | ICD-10-CM | POA: Diagnosis not present

## 2021-03-06 DIAGNOSIS — G894 Chronic pain syndrome: Secondary | ICD-10-CM | POA: Diagnosis not present

## 2021-03-06 DIAGNOSIS — M4726 Other spondylosis with radiculopathy, lumbar region: Secondary | ICD-10-CM | POA: Diagnosis not present

## 2021-03-07 DIAGNOSIS — Z03818 Encounter for observation for suspected exposure to other biological agents ruled out: Secondary | ICD-10-CM | POA: Diagnosis not present

## 2021-03-07 DIAGNOSIS — R110 Nausea: Secondary | ICD-10-CM | POA: Diagnosis not present

## 2021-03-07 DIAGNOSIS — R509 Fever, unspecified: Secondary | ICD-10-CM | POA: Diagnosis not present

## 2021-03-07 DIAGNOSIS — G43909 Migraine, unspecified, not intractable, without status migrainosus: Secondary | ICD-10-CM | POA: Diagnosis not present

## 2021-03-08 ENCOUNTER — Telehealth: Payer: Self-pay | Admitting: Family Medicine

## 2021-03-08 MED ORDER — RIZATRIPTAN BENZOATE 10 MG PO TBDP: 10.0000 mg | ORAL_TABLET | ORAL | 1 refills | Status: DC | PRN

## 2021-03-08 NOTE — Telephone Encounter (Signed)
Pt was advised refill sent. 

## 2021-03-08 NOTE — Telephone Encounter (Signed)
Pt hasn't used refill on medication below. ?Pharmacy said medication has expired & pt has to call us ? ?rizatriptan ?rizatriptan (MAXALT-MLT) 10 MG disintegrating tablet ? ? ?[id number] - [place], [place] - [address] Phone:  [phone]  ?Fax:  [phone]  ?  ? ? ? ? ? ? ?

## 2021-03-11 ENCOUNTER — Telehealth (INDEPENDENT_AMBULATORY_CARE_PROVIDER_SITE_OTHER): Payer: BC Managed Care – PPO | Admitting: Family Medicine

## 2021-03-11 ENCOUNTER — Other Ambulatory Visit: Payer: Self-pay

## 2021-03-11 ENCOUNTER — Encounter: Payer: Self-pay | Admitting: Family Medicine

## 2021-03-11 VITALS — Temp 97.4°F

## 2021-03-11 DIAGNOSIS — F988 Other specified behavioral and emotional disorders with onset usually occurring in childhood and adolescence: Secondary | ICD-10-CM | POA: Diagnosis not present

## 2021-03-11 NOTE — Progress Notes (Signed)
Virtual Visit via Video Note  I connected with Samantha Doyle on 03/11/21 at  1:00 PM EST by a video enabled telemedicine application and verified that I am speaking with the correct person using two identifiers.  Location patient: [place] Location provider:work or home office Persons participating in the virtual visit: patient, provider  I discussed the limitations and requested verbal permission for telemedicine visit. The patient expressed understanding and agreed to proceed.   HPI: 46 y/o female being seen today for 10d adult ADD, recently started methylphenidate 14m qd.  Since starting the med she feels much improved focus, concentration, task completion.  Less frustration, better multitasking, less impulsivity and restlessness.  Functioning work much better, her boss is pleased.  She has become much more organized at home with her knitting projects.  Husband has noted improvements.   Med effect is of appropriate duration.  Mood is stable. No adverse side effects from the medication.  Has cut bAck on monster--- says it does not taste that good anymore.  PMP AWARE reviewed today: most recent rx for methylphenidate ER [id number] filled 03/04/21, # 30, rx by me. No red flags.   Allergies  Allergen Reactions   Vancomycin Itching and Anaphylaxis    Face/back warm and red.  Pt lips and tongue swelled, hives   Demerol Hives and Nausea And Vomiting   Demerol  [Meperidine Hcl] Other (See Comments)   Meperidine Nausea And Vomiting and Hives   Microplegia Msa-Msg  [Plegisol]     Other reaction(s): headache   Monosodium Glutamate Nausea And Vomiting and Other (See Comments)    migraines Other reaction(s): headache   Tape Rash    Plastic tape   -COVID-19 vaccine status:  Immunization History  Administered Date(s) Administered   Influenza Split 09/11/2014, 09/07/2018, 10/03/2019   Influenza,inj,Quad PF,6+ Mos 09/11/2014, 10/09/2020   Influenza-Unspecified 09/11/2014, 10/07/2015, 09/07/2018    PFIZER(Purple Top)SARS-COV-2 Vaccination 07/26/2019, 08/16/2019   Tdap 08/07/2012     ROS: See pertinent positives and negatives per HPI.  Past Medical History:  Diagnosis Date   Allergic rhinitis    Arthritis    Carpal tunnel syndrome    right wrist, numb all the time   Cervical dysplasia    Chronic back pain    Degenerative lumbar spondylosis with grade 2 spondylolisthesis L4 on L5 with bilater pars defects L4.    COVID-19 virus infection 02/06/2020   DDD (degenerative disc disease), lumbar    MRI 10/2015: grade I (65m60manterolisthesis L4 on L5, w/ disc herniation with encroachment on spinal nerves at L4-S1 levels.   Depression    GERD (gastroesophageal reflux disease)    worsened 07/2018 likely associated with lap band being too constricting->increased pantoprazole to 18m22md at that time.   History of chronic bronchitis    hx of when she was a smoker: ? mild intermittent asthma?--much better since stopped smoking 2018.   History of fracture of phalanx of finger    left, 4th finger distal phalanx   History of non anemic vitamin B12 deficiency    s/p lap band surgery per pt report.   Hyperlipidemia    Hypertension    off all bp meds x 3-4 years   Iron deficiency anemia 01/2019   hemoccults ordered but never turned in (01/2019-01/2020). Iron supp started 02/2020. Hemoccults neg 07/2020, plan for iron infusion. 08/2020 EGD with SB bx normal, no source of IDA seen on colonoscopy. Iron infusion 09/06/2020   LAP-BAND surgery status    Pt getting lap band  removed and is getting gastric sleave after.   Migraine syndrome    Ovarian cyst 10/2015   LEFT, 4 cm--noted on L spine MRI w/out contrast.  F/u u/s imaging showed simple cyst.   Spondylarthrosis    Tobacco dependence in remission    quit 2018    Past Surgical History:  Procedure Laterality Date   BACK SURGERY  2019   CARPAL TUNNEL RELEASE Left    CERVICAL CONE BIOPSY     COLONOSCOPY     2015 normal.  08/2020 (for  IDA)->incomplete prep, no adenomas->rpt 5 yrs d/t incomplete prep.   ESOPHAGOGASTRODUODENOSCOPY N/A 04/27/2014   Procedure: ESOPHAGOGASTRODUODENOSCOPY (EGD);  Surgeon: Alphonsa Overall, MD;  Location: [facility];  Service: General;  Laterality: N/A;   ESOPHAGOGASTRODUODENOSCOPY  08/29/2020   6 cm hiatal hernia noted, o/w normal, SB bx neg.  ?hernia contributing to pt's IDA?   GASTRIC BANDING PORT REVISION N/A 10/02/2014   Procedure: LAPROSCOPIC PLACEMENT OF LAP BAND PORT;  Surgeon: Excell Seltzer, MD;  Location: [facility];  Service: General;  Laterality: N/A;   LAPAROSCOPIC GASTRIC BANDING  03/2007   [facility] - [facility]; Dr Octavia Bruckner Hipp   LAPAROSCOPIC REVISION OF GASTRIC BAND N/A 05/17/2012   Procedure: LAPAROSCOPIC REVISION OF SLIPPED GASTRIC BAND;  Surgeon: Edward Jolly, MD;  Location: [facility];  Service: General;  Laterality: N/A;   LAPAROSCOPY N/A 04/30/2014   Procedure: DIAGNOSTIC LAPAROSCOPY WITH REMOVAL OF INFECTED LAP BAND PORT WITH DEBRIDEMENT SUBCUTANEOUS ABSCESS;  Surgeon: Greer Pickerel, MD;  Location: [facility];  Service: General;  Laterality: N/A;   VARICOSE VEIN SURGERY Bilateral 2009     Current Outpatient Medications:    albuterol (VENTOLIN HFA) 108 (90 Base) MCG/ACT inhaler, INHALE 2 PUFFS INTO THE LUNGS EVERY 4 (FOUR) HOURS AS NEEDED FOR WHEEZING OR SHORTNESS OF BREATH., Disp: 8 g, Rfl: 0   baclofen (LIORESAL) 10 MG tablet, Take 10 mg by mouth every 8 (eight) hours., Disp: , Rfl:    cetirizine (ZYRTEC) 10 MG tablet, Take 20 mg by mouth daily as needed for allergies (allergies)., Disp: , Rfl:    diclofenac (VOLTAREN) 75 MG EC tablet, Take 1 tablet by mouth 2 (two) times daily. , Disp: , Rfl:    DULoxetine (CYMBALTA) 60 MG capsule, Take 1 capsule (60 mg total) by mouth daily., Disp: 90 capsule, Rfl: 3   gabapentin (NEURONTIN) 800 MG tablet, Take 800 mg by mouth 3 (three) times daily., Disp: , Rfl: 6   lisinopril (ZESTRIL) 40 MG tablet, TAKE 1 TABLET BY MOUTH EVERY  DAY, Disp: 90 tablet, Rfl: 0   methylphenidate (CONCERTA) 18 MG PO CR tablet, Take 1 tablet (18 mg total) by mouth daily., Disp: 30 tablet, Rfl: 0   Multiple Vitamin (MULITIVITAMIN WITH MINERALS) TABS, Take 1 tablet by mouth daily., Disp: , Rfl:    oxyCODONE (ROXICODONE) 15 MG immediate release tablet, TAKE ONE TABLET BY MOUTH EVERY 6 HOURS FOR 30 DAYS, Disp: , Rfl:    pantoprazole (PROTONIX) 40 MG tablet, TAKE 1 TABLET (40 MG TOTAL) BY MOUTH 2 (TWO) TIMES DAILY. OFFICE VISIT NEEDED FOR FURTHER REFILLS, Disp: 180 tablet, Rfl: 0   rizatriptan (MAXALT-MLT) 10 MG disintegrating tablet, Take 1 tablet (10 mg total) by mouth as needed for migraine. May repeat in 2 hours if needed, Disp: 10 tablet, Rfl: 1   clonazePAM (KLONOPIN) 0.5 MG tablet, TAKE 1 TABLET BY MOUTH TWICE A DAY AS NEEDED (Patient not taking: Reported on 12/10/2020), Disp: 180 tablet, Rfl: 0  NARCAN 4 MG/0.1ML LIQD nasal spray kit, USE ONE SPRAY INTRANASAL EVERY TWO TO THREE MINUTES UNTIL EMERGENCY TEAM ARRIVES AS DIRECTED, USE IN OPOID EMERGENCY ONLY (Patient not taking: Reported on 06/18/2020), Disp: , Rfl:   EXAM:  VITALS per patient if applicable:  Vitals with BMI 12/10/2020 09/13/2020 09/13/2020  Height 5' 2.75" - -  Weight 249 lbs - -  BMI 70.62 - -  Systolic 376 283 151  Diastolic 82 85 85  Pulse 761 55 57    GENERAL: alert, oriented, appears well and in no acute distress  HEENT: atraumatic, conjunttiva clear, no obvious abnormalities on inspection of external nose and ears  NECK: normal movements of the head and neck  LUNGS: on inspection no signs of respiratory distress, breathing rate appears normal, no obvious gross SOB, gasping or wheezing  CV: no obvious cyanosis  MS: moves all visible extremities without noticeable abnormality  PSYCH/NEURO: pleasant and cooperative, no obvious depression or anxiety, speech and thought processing grossly intact  LABS: none today    Chemistry      Component Value Date/Time   NA  138 12/10/2020 1650   NA 140 08/19/2020 0000   K 3.3 (L) 12/10/2020 1650   CL 100 12/10/2020 1650   CO2 30 12/10/2020 1650   BUN 10 12/10/2020 1650   BUN 13 08/19/2020 0000   CREATININE 0.65 12/10/2020 1650   CREATININE 0.61 08/07/2017 1508   GLU 83 08/19/2020 0000      Component Value Date/Time   CALCIUM 8.7 12/10/2020 1650   ALKPHOS 60 02/02/2020 1500   AST 15 02/02/2020 1500   ALT 13 02/02/2020 1500   BILITOT 0.5 02/02/2020 1500     ASSESSMENT AND PLAN:  Discussed the following assessment and plan:  Adult ADD, good response to Concerta 18 mg a day. Continue this, recheck in a month. She is much happier with how things are going!  I discussed the assessment and treatment plan with the patient. The patient was provided an opportunity to ask questions and all were answered. The patient agreed with the plan and demonstrated an understanding of the instructions.   F/u: 1 mo  Signed:  Crissie Sickles, MD           03/11/2021

## 2021-03-12 DIAGNOSIS — M960 Pseudarthrosis after fusion or arthrodesis: Secondary | ICD-10-CM | POA: Diagnosis not present

## 2021-03-12 DIAGNOSIS — M4726 Other spondylosis with radiculopathy, lumbar region: Secondary | ICD-10-CM | POA: Diagnosis not present

## 2021-03-12 DIAGNOSIS — Z981 Arthrodesis status: Secondary | ICD-10-CM | POA: Diagnosis not present

## 2021-03-12 DIAGNOSIS — M5115 Intervertebral disc disorders with radiculopathy, thoracolumbar region: Secondary | ICD-10-CM | POA: Diagnosis not present

## 2021-03-12 DIAGNOSIS — M5117 Intervertebral disc disorders with radiculopathy, lumbosacral region: Secondary | ICD-10-CM | POA: Diagnosis not present

## 2021-03-18 ENCOUNTER — Other Ambulatory Visit: Payer: Self-pay | Admitting: Family Medicine

## 2021-04-04 ENCOUNTER — Telehealth (INDEPENDENT_AMBULATORY_CARE_PROVIDER_SITE_OTHER): Payer: BC Managed Care – PPO | Admitting: Family Medicine

## 2021-04-04 ENCOUNTER — Encounter: Payer: Self-pay | Admitting: Family Medicine

## 2021-04-04 DIAGNOSIS — F988 Other specified behavioral and emotional disorders with onset usually occurring in childhood and adolescence: Secondary | ICD-10-CM | POA: Diagnosis not present

## 2021-04-04 MED ORDER — METHYLPHENIDATE HCL ER (OSM) 18 MG PO TBCR: 18.0000 mg | EXTENDED_RELEASE_TABLET | Freq: Every day | ORAL | 0 refills | Status: DC

## 2021-04-04 NOTE — Progress Notes (Signed)
Virtual Visit via Video Note ? ?I connected with Samantha Doyle on 04/04/21 at 11:00 AM EDT by a video enabled telemedicine application and verified that I am speaking with the correct person using two identifiers. ? Location patient: [place] ?Location provider:work or home office ?Persons participating in the virtual visit: patient, provider ? ?I discussed the limitations and requested verbal permission for telemedicine visit. The patient expressed understanding and agreed to proceed. ? ?CC: ?46 y/o female being seen today for 3-week follow-up adult ADD. ?A/P as of last visit: ?"Adult ADD, good response to Concerta 18 mg a day. ?Continue this, recheck in a month. ?She is much happier with how things are going!" ? ?INTERIM HX: ?She continues to get excellent therapeutic effect from the 18 mg methylphenidate ER daily. ?She takes around noon and feels like it lasts about 9 hours.  She works 12-hour days and would definitely like it the last few more hours than it does but says this is not a big enough issue at this time to change her dose.  She is doing very well at work and is attributing all her improvements there to being on this medication. ?She is in the midst of planning her wedding and feels like she is doing excellent with her organization along these lines. ? ?PMP AWARE reviewed today: most recent rx for concerta  56LO was filled 03/04/21, # 30, rx by me. ?No red flags. ? ?Allergies  ?Allergen Reactions  ? Vancomycin Itching and Anaphylaxis  ?  Face/back warm and red.  Pt lips and tongue swelled, hives  ? Demerol Hives and Nausea And Vomiting  ? Demerol  [Meperidine Hcl] Other (See Comments)  ? Meperidine Nausea And Vomiting and Hives  ? Microplegia Msa-Msg  [Plegisol]   ?  Other reaction(s): headache  ? Monosodium Glutamate Nausea And Vomiting and Other (See Comments)  ?  migraines ?Other reaction(s): headache  ? Tape Rash  ?  Plastic tape  ? ?-COVID-19 vaccine status:  ?Immunization History  ?Administered Date(s)  Administered  ? Influenza Split 09/11/2014, 09/07/2018, 10/03/2019  ? Influenza,inj,Quad PF,6+ Mos 09/11/2014, 10/09/2020  ? Influenza-Unspecified 09/11/2014, 10/07/2015, 09/07/2018  ? PFIZER(Purple Top)SARS-COV-2 Vaccination 07/26/2019, 08/16/2019  ? Tdap 08/07/2012  ? ? ? ?ROS: See pertinent positives and negatives per HPI. ? ?Past Medical History:  ?Diagnosis Date  ? Allergic rhinitis   ? Arthritis   ? Carpal tunnel syndrome   ? right wrist, numb all the time  ? Cervical dysplasia   ? Chronic back pain   ? Degenerative lumbar spondylosis with grade 2 spondylolisthesis L4 on L5 with bilater pars defects L4.   ? COVID-19 virus infection 02/06/2020  ? DDD (degenerative disc disease), lumbar   ? MRI 10/2015: grade I (58m) anterolisthesis L4 on L5, w/ disc herniation with encroachment on spinal nerves at L4-S1 levels.  ? Depression   ? GERD (gastroesophageal reflux disease)   ? worsened 07/2018 likely associated with lap band being too constricting->increased pantoprazole to 472mbid at that time.  ? History of chronic bronchitis   ? hx of when she was a smoker: ? mild intermittent asthma?--much better since stopped smoking 2018.  ? History of fracture of phalanx of finger   ? left, 4th finger distal phalanx  ? History of non anemic vitamin B12 deficiency   ? s/p lap band surgery per pt report.  ? Hyperlipidemia   ? Hypertension   ? off all bp meds x 3-4 years  ? Iron deficiency anemia 01/2019  ?  hemoccults ordered but never turned in (01/2019-01/2020). Iron supp started 02/2020. Hemoccults neg 07/2020, plan for iron infusion. 08/2020 EGD with SB bx normal, no source of IDA seen on colonoscopy. Iron infusion 09/06/2020  ? LAP-BAND surgery status   ? Pt getting lap band removed and is getting gastric sleave after.  ? Migraine syndrome   ? Ovarian cyst 10/2015  ? LEFT, 4 cm--noted on L spine MRI w/out contrast.  F/u u/s imaging showed simple cyst.  ? Spondylarthrosis   ? Tobacco dependence in remission   ? quit 2018  ? ? ?Past  Surgical History:  ?Procedure Laterality Date  ? BACK SURGERY  2019  ? CARPAL TUNNEL RELEASE Left   ? CERVICAL CONE BIOPSY    ? COLONOSCOPY    ? 2015 normal.  08/2020 (for IDA)->incomplete prep, no adenomas->rpt 5 yrs d/t incomplete prep.  ? ESOPHAGOGASTRODUODENOSCOPY N/A 04/27/2014  ? Procedure: ESOPHAGOGASTRODUODENOSCOPY (EGD);  Surgeon: Alphonsa Overall, MD;  Location: [facility];  Service: General;  Laterality: N/A;  ? ESOPHAGOGASTRODUODENOSCOPY  08/29/2020  ? 6 cm hiatal hernia noted, o/w normal, SB bx neg.  ?hernia contributing to pt's IDA?  ? GASTRIC BANDING PORT REVISION N/A 10/02/2014  ? Procedure: LAPROSCOPIC PLACEMENT OF LAP BAND PORT;  Surgeon: Excell Seltzer, MD;  Location: [facility];  Service: General;  Laterality: N/A;  ? LAPAROSCOPIC GASTRIC BANDING  03/2007  ? APS - [facility]; Dr Octavia Bruckner Hipp  ? LAPAROSCOPIC REVISION OF GASTRIC BAND N/A 05/17/2012  ? Procedure: LAPAROSCOPIC REVISION OF SLIPPED GASTRIC BAND;  Surgeon: Edward Jolly, MD;  Location: [facility];  Service: General;  Laterality: N/A;  ? LAPAROSCOPY N/A 04/30/2014  ? Procedure: DIAGNOSTIC LAPAROSCOPY WITH REMOVAL OF INFECTED LAP BAND PORT WITH DEBRIDEMENT SUBCUTANEOUS ABSCESS;  Surgeon: Greer Pickerel, MD;  Location: [facility];  Service: General;  Laterality: N/A;  ? VARICOSE VEIN SURGERY Bilateral 2009  ? ? ? ?Current Outpatient Medications:  ?  albuterol (VENTOLIN HFA) 108 (90 Base) MCG/ACT inhaler, INHALE 2 PUFFS INTO THE LUNGS EVERY 4 (FOUR) HOURS AS NEEDED FOR WHEEZING OR SHORTNESS OF BREATH., Disp: 8 g, Rfl: 0 ?  baclofen (LIORESAL) 10 MG tablet, Take 10 mg by mouth every 8 (eight) hours., Disp: , Rfl:  ?  cetirizine (ZYRTEC) 10 MG tablet, Take 20 mg by mouth daily as needed for allergies (allergies)., Disp: , Rfl:  ?  diclofenac (VOLTAREN) 75 MG EC tablet, Take 1 tablet by mouth 2 (two) times daily. , Disp: , Rfl:  ?  DULoxetine (CYMBALTA) 60 MG capsule, Take 1 capsule (60 mg total) by mouth daily., Disp: 90 capsule, Rfl:  3 ?  gabapentin (NEURONTIN) 800 MG tablet, Take 800 mg by mouth 3 (three) times daily., Disp: , Rfl: 6 ?  lisinopril (ZESTRIL) 40 MG tablet, TAKE 1 TABLET BY MOUTH EVERY DAY, Disp: 90 tablet, Rfl: 0 ?  Multiple Vitamin (MULITIVITAMIN WITH MINERALS) TABS, Take 1 tablet by mouth daily., Disp: , Rfl:  ?  oxyCODONE (ROXICODONE) 15 MG immediate release tablet, TAKE ONE TABLET BY MOUTH EVERY 6 HOURS FOR 30 DAYS, Disp: , Rfl:  ?  pantoprazole (PROTONIX) 40 MG tablet, TAKE 1 TABLET (40 MG TOTAL) BY MOUTH 2 (TWO) TIMES DAILY. OFFICE VISIT NEEDED FOR FURTHER REFILLS, Disp: 180 tablet, Rfl: 0 ?  rizatriptan (MAXALT-MLT) 10 MG disintegrating tablet, Take 1 tablet (10 mg total) by mouth as needed for migraine. May repeat in 2 hours if needed, Disp: 10 tablet, Rfl: 1 ?  clonazePAM (KLONOPIN) 0.5 MG tablet,  TAKE 1 TABLET BY MOUTH TWICE A DAY AS NEEDED (Patient not taking: Reported on 12/10/2020), Disp: 180 tablet, Rfl: 0 ?  methylphenidate (CONCERTA) 18 MG PO CR tablet, Take 1 tablet (18 mg total) by mouth daily., Disp: 30 tablet, Rfl: 0 ?  NARCAN 4 MG/0.1ML LIQD nasal spray kit, USE ONE SPRAY INTRANASAL EVERY TWO TO THREE MINUTES UNTIL EMERGENCY TEAM ARRIVES AS DIRECTED, USE IN OPOID EMERGENCY ONLY (Patient not taking: Reported on 06/18/2020), Disp: , Rfl:  ? ?EXAM: ? ?VITALS per patient if applicable:  ? ?  18/04/373  ?  3:15 PM 09/13/2020  ?  1:31 PM 09/13/2020  ? 12:16 PM  ?Vitals with BMI  ?Height 5' 2.75"    ?Weight 249 lbs    ?BMI 44.45    ?Systolic 436 067 703  ?Diastolic 82 85 85  ?Pulse 117 55 57  ? ? ?GENERAL: alert, oriented, appears well and in no acute distress ? ?HEENT: atraumatic, conjunttiva clear, no obvious abnormalities on inspection of external nose and ears ? ?NECK: normal movements of the head and neck ? ?LUNGS: on inspection no signs of respiratory distress, breathing rate appears normal, no obvious gross SOB, gasping or wheezing ? ?CV: no obvious cyanosis ? ?MS: moves all visible extremities without noticeable  abnormality ? ?PSYCH/NEURO: pleasant and cooperative, no obvious depression or anxiety, speech and thought processing grossly intact ? ?LABS: none today ? ?  Chemistry   ?   ?Component Value Date/Time  ? NA

## 2021-04-06 DIAGNOSIS — M5136 Other intervertebral disc degeneration, lumbar region: Secondary | ICD-10-CM | POA: Diagnosis not present

## 2021-04-06 DIAGNOSIS — M4726 Other spondylosis with radiculopathy, lumbar region: Secondary | ICD-10-CM | POA: Diagnosis not present

## 2021-04-06 DIAGNOSIS — G894 Chronic pain syndrome: Secondary | ICD-10-CM | POA: Diagnosis not present

## 2021-04-08 ENCOUNTER — Emergency Department (HOSPITAL_COMMUNITY)
Admission: EM | Admit: 2021-04-08 | Discharge: 2021-04-08 | Disposition: A | Discharge status: Home / Self Care | Payer: BC Managed Care – PPO | Attending: Student | Admitting: Student

## 2021-04-08 ENCOUNTER — Emergency Department (HOSPITAL_COMMUNITY): Payer: BC Managed Care – PPO

## 2021-04-08 ENCOUNTER — Other Ambulatory Visit: Payer: Self-pay

## 2021-04-08 DIAGNOSIS — R0602 Shortness of breath: Secondary | ICD-10-CM | POA: Insufficient documentation

## 2021-04-08 DIAGNOSIS — E876 Hypokalemia: Secondary | ICD-10-CM | POA: Diagnosis not present

## 2021-04-08 DIAGNOSIS — Z79899 Other long term (current) drug therapy: Secondary | ICD-10-CM | POA: Diagnosis not present

## 2021-04-08 DIAGNOSIS — I10 Essential (primary) hypertension: Secondary | ICD-10-CM | POA: Diagnosis not present

## 2021-04-08 DIAGNOSIS — D72829 Elevated white blood cell count, unspecified: Secondary | ICD-10-CM | POA: Insufficient documentation

## 2021-04-08 DIAGNOSIS — Z87891 Personal history of nicotine dependence: Secondary | ICD-10-CM | POA: Insufficient documentation

## 2021-04-08 DIAGNOSIS — Z20822 Contact with and (suspected) exposure to covid-19: Secondary | ICD-10-CM | POA: Insufficient documentation

## 2021-04-08 DIAGNOSIS — R509 Fever, unspecified: Secondary | ICD-10-CM | POA: Diagnosis not present

## 2021-04-08 DIAGNOSIS — R059 Cough, unspecified: Secondary | ICD-10-CM | POA: Insufficient documentation

## 2021-04-08 DIAGNOSIS — J189 Pneumonia, unspecified organism: Secondary | ICD-10-CM | POA: Diagnosis not present

## 2021-04-08 DIAGNOSIS — R0902 Hypoxemia: Secondary | ICD-10-CM | POA: Diagnosis not present

## 2021-04-08 DIAGNOSIS — R000 Tachycardia, unspecified: Secondary | ICD-10-CM | POA: Diagnosis not present

## 2021-04-08 DIAGNOSIS — R519 Headache, unspecified: Secondary | ICD-10-CM | POA: Insufficient documentation

## 2021-04-08 DIAGNOSIS — R457 State of emotional shock and stress, unspecified: Secondary | ICD-10-CM | POA: Diagnosis not present

## 2021-04-08 DIAGNOSIS — Z8616 Personal history of COVID-19: Secondary | ICD-10-CM | POA: Insufficient documentation

## 2021-04-08 LAB — CBC WITH DIFFERENTIAL/PLATELET
Abs Immature Granulocytes: 0.04 10*3/uL (ref 0.00–0.07)
Basophils Absolute: 0 10*3/uL (ref 0.0–0.1)
Basophils Relative: 0 %
Eosinophils Absolute: 0 10*3/uL (ref 0.0–0.5)
Eosinophils Relative: 0 %
HCT: 34.2 % — ABNORMAL LOW (ref 36.0–46.0)
Hemoglobin: 10.5 g/dL — ABNORMAL LOW (ref 12.0–15.0)
Immature Granulocytes: 0 %
Lymphocytes Relative: 5 %
Lymphs Abs: 0.6 10*3/uL — ABNORMAL LOW (ref 0.7–4.0)
MCH: 24.7 pg — ABNORMAL LOW (ref 26.0–34.0)
MCHC: 30.7 g/dL (ref 30.0–36.0)
MCV: 80.5 fL (ref 80.0–100.0)
Monocytes Absolute: 0.7 10*3/uL (ref 0.1–1.0)
Monocytes Relative: 5 %
Neutro Abs: 11.5 10*3/uL — ABNORMAL HIGH (ref 1.7–7.7)
Neutrophils Relative %: 90 %
Platelets: 219 10*3/uL (ref 150–400)
RBC: 4.25 MIL/uL (ref 3.87–5.11)
RDW: 16.1 % — ABNORMAL HIGH (ref 11.5–15.5)
WBC: 12.9 10*3/uL — ABNORMAL HIGH (ref 4.0–10.5)
nRBC: 0 % (ref 0.0–0.2)

## 2021-04-08 LAB — COMPREHENSIVE METABOLIC PANEL
ALT: 17 U/L (ref 0–44)
AST: 21 U/L (ref 15–41)
Albumin: 3.2 g/dL — ABNORMAL LOW (ref 3.5–5.0)
Alkaline Phosphatase: 53 U/L (ref 38–126)
Anion gap: 8 (ref 5–15)
BUN: 5 mg/dL — ABNORMAL LOW (ref 6–20)
CO2: 28 mmol/L (ref 22–32)
Calcium: 8.8 mg/dL — ABNORMAL LOW (ref 8.9–10.3)
Chloride: 102 mmol/L (ref 98–111)
Creatinine, Ser: 0.71 mg/dL (ref 0.44–1.00)
GFR, Estimated: >60 mL/min (ref >60)
Glucose, Bld: 87 mg/dL (ref 70–99)
Potassium: 3.4 mmol/L — ABNORMAL LOW (ref 3.5–5.1)
Sodium: 138 mmol/L (ref 135–145)
Total Bilirubin: 0.7 mg/dL (ref 0.3–1.2)
Total Protein: 6.6 g/dL (ref 6.5–8.1)

## 2021-04-08 LAB — POC URINE PREG, ED: Preg Test, Ur: NEGATIVE

## 2021-04-08 LAB — RESP PANEL BY RT-PCR (FLU A&B, COVID) ARPGX2
Influenza A by PCR: NEGATIVE
Influenza B by PCR: NEGATIVE
SARS Coronavirus 2 by RT PCR: NEGATIVE

## 2021-04-08 LAB — LACTIC ACID, PLASMA: Lactic Acid, Venous: 1.2 mmol/L (ref 0.5–1.9)

## 2021-04-08 MED ORDER — AMOXICILLIN-POT CLAVULANATE 875-125 MG PO TABS: 1.0000 | ORAL_TABLET | Freq: Two times a day (BID) | ORAL | 0 refills | Status: DC

## 2021-04-08 MED ORDER — LACTATED RINGERS IV BOLUS
1000.0000 mL | Freq: Once | INTRAVENOUS | Status: AC
Start: 2021-04-08 — End: 2021-04-08
  Administered 2021-04-08: 1000 mL via INTRAVENOUS

## 2021-04-08 MED ORDER — SODIUM CHLORIDE 0.9 % IV SOLN
1.0000 g | Freq: Once | INTRAVENOUS | Status: AC
  Administered 2021-04-08: 1 g via INTRAVENOUS
  Filled 2021-04-08: qty 10

## 2021-04-08 MED ORDER — DOXYCYCLINE HYCLATE 100 MG PO CAPS: 100.0000 mg | ORAL_CAPSULE | Freq: Two times a day (BID) | ORAL | 0 refills | Status: DC

## 2021-04-08 MED ORDER — KETOROLAC TROMETHAMINE 15 MG/ML IJ SOLN
15.0000 mg | Freq: Once | INTRAMUSCULAR | Status: AC
  Administered 2021-04-08: 15 mg via INTRAVENOUS
  Filled 2021-04-08: qty 1

## 2021-04-08 MED ORDER — SODIUM CHLORIDE 0.9 % IV SOLN
500.0000 mg | Freq: Once | INTRAVENOUS | Status: AC
  Administered 2021-04-08: 500 mg via INTRAVENOUS
  Filled 2021-04-08: qty 5

## 2021-04-08 MED ORDER — DIPHENHYDRAMINE HCL 50 MG/ML IJ SOLN
25.0000 mg | Freq: Once | INTRAMUSCULAR | Status: AC
  Administered 2021-04-08: 25 mg via INTRAVENOUS
  Filled 2021-04-08: qty 1

## 2021-04-08 MED ORDER — PROCHLORPERAZINE EDISYLATE 10 MG/2ML IJ SOLN
10.0000 mg | Freq: Once | INTRAMUSCULAR | Status: AC
  Administered 2021-04-08: 10 mg via INTRAVENOUS
  Filled 2021-04-08: qty 2

## 2021-04-08 MED ORDER — LACTATED RINGERS IV BOLUS
1000.0000 mL | Freq: Once | INTRAVENOUS | Status: AC
  Administered 2021-04-08: 1000 mL via INTRAVENOUS

## 2021-04-08 NOTE — ED Provider Notes (Signed)
?[facility] ?Provider Note ? ?CSN: [id number] ?Arrival date & time: 04/08/21 1632 ? ?Chief Complaint(s) ?Shortness of Breath ? ?HPI ?Samantha Doyle is a 46 y.o. female with PMH GERD, depression, migraines, HTN who presents emergency department for evaluation of cough, fever, headache.  Patient states that her symptoms worsened this morning where she states that she woke up and coughed up a lot of phlegm.  She endorses shaking and chills.  Endorses mild shortness of breath.  Denies chest pain, abdominal pain, nausea, vomiting or other systemic symptoms. ? ? ?Shortness of Breath ?Associated symptoms: cough, fever and headaches   ? ?Past Medical History ?Past Medical History:  ?Diagnosis Date  ? Allergic rhinitis   ? Arthritis   ? Carpal tunnel syndrome   ? right wrist, numb all the time  ? Cervical dysplasia   ? Chronic back pain   ? Degenerative lumbar spondylosis with grade 2 spondylolisthesis L4 on L5 with bilater pars defects L4.   ? COVID-19 virus infection 02/06/2020  ? DDD (degenerative disc disease), lumbar   ? MRI 10/2015: grade I (4mm) anterolisthesis L4 on L5, w/ disc herniation with encroachment on spinal nerves at L4-S1 levels.  ? Depression   ? GERD (gastroesophageal reflux disease)   ? worsened 07/2018 likely associated with lap band being too constricting->increased pantoprazole to $RemoveBeforeD'40mg'[id number]$  bid at that time.  ? History of chronic bronchitis   ? hx of when she was a smoker: ? mild intermittent asthma?--much better since stopped smoking 2018.  ? History of fracture of phalanx of finger   ? left, 4th finger distal phalanx  ? History of non anemic vitamin B12 deficiency   ? s/p lap band surgery per pt report.  ? Hyperlipidemia   ? Hypertension   ? off all bp meds x 3-4 years  ? Iron deficiency anemia 01/2019  ? hemoccults ordered but never turned in (01/2019-01/2020). Iron supp started 02/2020. Hemoccults neg 07/2020, plan for iron infusion. 08/2020 EGD with SB bx normal, no  source of IDA seen on colonoscopy. Iron infusion 09/06/2020  ? LAP-BAND surgery status   ? Pt getting lap band removed and is getting gastric sleave after.  ? Migraine syndrome   ? Ovarian cyst 10/2015  ? LEFT, 4 cm--noted on L spine MRI w/out contrast.  F/u u/s imaging showed simple cyst.  ? Spondylarthrosis   ? Tobacco dependence in remission   ? quit 2018  ? ?Patient Active Problem List  ? Diagnosis Date Noted  ? Complication of surgical procedure 07/14/2019  ? Chronic pain syndrome 06/18/2018  ? 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 Lumbosacral spondylosis without myelopathy 06/18/2018  ? Depression   ? Spondylolisthesis at L4-L5 level 06/12/2016  ? Stiffness of finger joint 05/10/2015  ? Mallet deformity of fourth finger, left 02/22/2015  ? Migraine 02/16/2015  ? GERD (gastroesophageal reflux disease) 02/16/2015  ? Tobacco dependence 02/16/2015  ? Vitamin B 12 deficiency 02/16/2015  ? Lapband APS 2009 [facility] 05/16/2012  ? Encounter for fitting or adjustment of gastric lap band 05/13/2012  ? Anxiety 02/08/2011  ? Essential (primary) hypertension 02/08/2011  ? ?Home Medication(s) ?Prior to Admission medications   ?Medication Sig Start Date End Date Taking? Authorizing Provider  ?albuterol (VENTOLIN HFA) 108 (90 Base) MCG/ACT inhaler INHALE 2 PUFFS INTO THE LUNGS EVERY 4 (FOUR) HOURS AS NEEDED FOR WHEEZING OR SHORTNESS OF BREATH. 08/11/18   McGowen, Adrian Blackwater, MD  ?baclofen (LIORESAL) 10 MG tablet  Take 10 mg by mouth every 8 (eight) hours. 04/22/19   [provider]  ?cetirizine (ZYRTEC) 10 MG tablet Take 20 mg by mouth daily as needed for allergies (allergies).    [provider]  ?clonazePAM (KLONOPIN) 0.5 MG tablet TAKE 1 TABLET BY MOUTH TWICE A DAY AS NEEDED ?Patient not taking: Reported on 12/10/2020 11/08/19   Tammi Sou, MD  ?diclofenac (VOLTAREN) 75 MG EC tablet Take 1 tablet by mouth 2 (two) times daily.  05/22/16   [provider]   ?DULoxetine (CYMBALTA) 60 MG capsule Take 1 capsule (60 mg total) by mouth daily. 06/14/20   McGowen, Adrian Blackwater, MD  ?gabapentin (NEURONTIN) 800 MG tablet Take 800 mg by mouth 3 (three) times daily. 12/31/16   [provider]  ?lisinopril (ZESTRIL) 40 MG tablet TAKE 1 TABLET BY MOUTH EVERY DAY 02/14/21   McGowen, Adrian Blackwater, MD  ?methylphenidate (CONCERTA) 18 MG PO CR tablet Take 1 tablet (18 mg total) by mouth daily. 04/04/21   McGowen, Adrian Blackwater, MD  ?Multiple Vitamin (MULITIVITAMIN WITH MINERALS) TABS Take 1 tablet by mouth daily.    [provider]  ?NARCAN 4 MG/0.1ML LIQD nasal spray kit USE ONE SPRAY INTRANASAL EVERY TWO TO THREE MINUTES UNTIL EMERGENCY TEAM ARRIVES AS DIRECTED, USE IN OPOID EMERGENCY ONLY ?Patient not taking: Reported on 06/18/2020 01/31/19   [provider]  ?oxyCODONE (ROXICODONE) 15 MG immediate release tablet TAKE ONE TABLET BY MOUTH EVERY 6 HOURS FOR 30 DAYS 10/11/18   [provider]  ?pantoprazole (PROTONIX) 40 MG tablet TAKE 1 TABLET (40 MG TOTAL) BY MOUTH 2 (TWO) TIMES DAILY. OFFICE VISIT NEEDED FOR FURTHER REFILLS 03/18/21   McGowen, Adrian Blackwater, MD  ?rizatriptan (MAXALT-MLT) 10 MG disintegrating tablet Take 1 tablet (10 mg total) by mouth as needed for migraine. May repeat in 2 hours if needed 03/08/21   McGowen, Adrian Blackwater, MD  ?                                                                                                                                  ?Past Surgical History ?Past Surgical History:  ?Procedure Laterality Date  ? BACK SURGERY  2019  ? CARPAL TUNNEL RELEASE Left   ? CERVICAL CONE BIOPSY    ? COLONOSCOPY    ? 2015 normal.  08/2020 (for IDA)->incomplete prep, no adenomas->rpt 5 yrs d/t incomplete prep.  ? ESOPHAGOGASTRODUODENOSCOPY N/A 04/27/2014  ? Procedure: ESOPHAGOGASTRODUODENOSCOPY (EGD);  Surgeon: Alphonsa Overall, MD;  Location: [facility];  Service: General;  Laterality: N/A;  ? ESOPHAGOGASTRODUODENOSCOPY  08/29/2020  ? 6 cm hiatal hernia  noted, o/w normal, SB bx neg.  ?hernia contributing to pt's IDA?  ? GASTRIC BANDING PORT REVISION N/A 10/02/2014  ? Procedure: LAPROSCOPIC PLACEMENT OF LAP BAND PORT;  Surgeon: Excell Seltzer, MD;  Location: [facility];  Service: General;  Laterality: N/A;  ? LAPAROSCOPIC GASTRIC BANDING  03/2007  ? APS -  [facility]; Dr Octavia Bruckner Hipp  ? LAPAROSCOPIC REVISION OF GASTRIC BAND N/A 05/17/2012  ? Procedure: LAPAROSCOPIC REVISION OF SLIPPED GASTRIC BAND;  Surgeon: Edward Jolly, MD;  Location: [facility];  Service: General;  Laterality: N/A;  ? LAPAROSCOPY N/A 04/30/2014  ? Procedure: DIAGNOSTIC LAPAROSCOPY WITH REMOVAL OF INFECTED LAP BAND PORT WITH DEBRIDEMENT SUBCUTANEOUS ABSCESS;  Surgeon: Greer Pickerel, MD;  Location: [facility];  Service: General;  Laterality: N/A;  ? VARICOSE VEIN SURGERY Bilateral 2009  ? ?Family History ?Family History  ?Problem Relation Age of Onset  ? Alcohol abuse Mother   ? Drug abuse Mother   ? Arthritis Mother   ? Hyperlipidemia Mother   ? Heart disease Mother   ? Hypertension Mother   ? Non-Hodgkin's lymphoma Mother   ? Lung cancer Mother   ? Alcohol abuse Father   ? Arthritis Father   ? Hyperlipidemia Father   ? Heart disease Father   ? Stroke Father   ? Hypertension Father   ? Mental illness Father   ? Diabetes Father   ? Hyperlipidemia Sister   ? Hypertension Sister   ? Arthritis Maternal Grandmother   ? Cancer Maternal Grandmother   ? Hypertension Maternal Grandmother   ? Arthritis Maternal Grandfather   ? Arthritis Paternal Grandmother   ? Cancer Paternal Grandmother   ? Hyperlipidemia Paternal Grandmother   ? Heart disease Paternal Grandmother   ? Hypertension Paternal Grandmother   ? Diabetes Paternal Grandmother   ? Arthritis Paternal Grandfather   ? Hyperlipidemia Paternal Grandfather   ? Heart disease Paternal Grandfather   ? Stroke Paternal Grandfather   ? Hypertension Paternal Grandfather   ? ? ?Social History ?Social History  ? ?Tobacco Use  ? Smoking status: Former   ?  Packs/day: 0.25  ?  Years: 5.00  ?  Pack years: 1.25  ?  Types: Cigarettes  ? Smokeless tobacco: Never  ?Substance Use Topics  ? Alcohol use: No  ? Drug use: No  ? ?Allergies ?Vancomycin, Demerol, Demerol  [me

## 2021-04-08 NOTE — ED Provider Triage Note (Signed)
Emergency Medicine Provider Triage Evaluation Note ? ?Samantha Doyle , a 46 y.o. female  was evaluated in triage.  Pt complains of shortness of breath, cough since this morning.  She reports she is coughing up phlegm.  Reports chills, but did not take her temperature.  She was taken here by EMS reports they put her on 3 L of nasal cannula and she was at 95%.  Unknown to what the patient started out on.  The patient was trialed off oxygen here and satting at 93%.  Put on 1 L of oxygen for comfort care.  Denies any nasal congestion, rhinorrhea, or sore throat.  She does mention she has a headache but has a history of headaches.  Denies any chest pain.. ? ?Review of Systems  ?Positive: See above  ?Negative: See above ? ?Physical Exam  ?BP 136/85 (BP Location: Right Arm)   Pulse (!) 116   Temp (!) 100.6 ?F (38.1 ?C) (Oral)   Resp 20   SpO2 93%  ?Gen:   Awake, no distress   ?Resp:  Normal effort, diminished breath sounds, speaking in full sentences  ?MSK:   Moves extremities without difficulty  ?Other:   ? ?Medical Decision Making  ?Medically screening exam initiated at 5:00 PM.  Appropriate orders placed.  Samantha Doyle was informed that the remainder of the evaluation will be completed by another provider, this initial triage assessment does not replace that evaluation, and the importance of remaining in the ED until their evaluation is complete. ? ?CXR and labs ordered ?  ?Achille Rich, PA-C ?04/08/21 1701 ? ?

## 2021-04-08 NOTE — ED Triage Notes (Signed)
Pt here via [facility] from work for shob that she woke up with today at noon. Pt went to work and she started having cold chills and non-productive cough, pt felt like she was having a "phlegm problem". Pt had temp w/ EMS of 100.5, gave 1000mg  tylenol. 138/70, 115HR, 95% on 3L via O2 ?

## 2021-04-08 NOTE — ED Notes (Signed)
Pt verbalized understanding of d/c instructions, meds, and followup care. Denies questions. VSS, no distress noted. Steady gait to exit with all belongings. Husband called to pick up patient. ?

## 2021-04-08 NOTE — ED Notes (Addendum)
Pt SpO2 93% on RA, placed on 1L O2 for comfort ?

## 2021-04-10 ENCOUNTER — Telehealth: Payer: Self-pay | Admitting: Family Medicine

## 2021-04-10 NOTE — Telephone Encounter (Signed)
Ok to put in any opening in my schedule--doesn't have to be a specific hospital f/u spot. ?OK to stay out of work until Monday, 4/10.  Pls do work note if she needs one. ?

## 2021-04-10 NOTE — Telephone Encounter (Signed)
Please advise 

## 2021-04-10 NOTE — Telephone Encounter (Signed)
Pt called she was released frm hospital Monday night April 3. ? ?Pt went to hospital for pneumonia, they told her to f/u with her PCP. ?Informed pt that the next Hospital f/u appt slot I see is for April 11 at 11:20 am. ? ?Pt wants to know what should she do about work. Should she go back ? ?Pt cell: [phone] ? ?  ? ? ?

## 2021-04-11 NOTE — Telephone Encounter (Signed)
Spoke with pt, she will keep appt on 4/11. Work note completed and sent via Clinical cytogeneticist. Pt voiced understanding. ?

## 2021-04-13 LAB — CULTURE, BLOOD (ROUTINE X 2)
Culture: NO GROWTH
Culture: NO GROWTH
Special Requests: ADEQUATE
Special Requests: ADEQUATE

## 2021-04-16 ENCOUNTER — Ambulatory Visit (INDEPENDENT_AMBULATORY_CARE_PROVIDER_SITE_OTHER): Payer: BC Managed Care – PPO | Admitting: Family Medicine

## 2021-04-16 ENCOUNTER — Encounter: Payer: Self-pay | Admitting: Family Medicine

## 2021-04-16 VITALS — BP 138/83 | HR 68 | Temp 98.2°F | Ht 62.75 in | Wt 246.6 lb

## 2021-04-16 DIAGNOSIS — J189 Pneumonia, unspecified organism: Secondary | ICD-10-CM

## 2021-04-16 NOTE — Progress Notes (Signed)
OFFICE VISIT ? ?04/16/2021 ? ?CC:  ?Chief Complaint  ?Samantha Doyle presents with  ? Pneumonia  ?  ED follow up; pt   ? ? ?HPI:   ? ?Samantha Doyle is a 46 y.o. female who presents for ED f/u for pneumonia. ? ?INTERIM HX: ?Pt present to ED 04/08/21 for fever, HA and worsening cough/resp illness. ?Exam and CXR supportive of CAP.  WBC 12.9 K, K 3.4, Hb 10.5 (stable, has IDA) o/w labs normal. ?IV Rocephin and azith were given in ED.  She was d/c'd home on augmentin and doxycycline. ? ?She is feeling a lot better. ?Still a little fatigued and breathless at times with exertion but much improved.  No fevers.  Still has productive cough but this has improved as well. ?She is heading back to work today. ?No problems taking Augmentin and doxycycline--has 2 days left. ? ?Past Medical History:  ?Diagnosis Date  ? Allergic rhinitis   ? Arthritis   ? Carpal tunnel syndrome   ? right wrist, numb all the time  ? Cervical dysplasia   ? Chronic back pain   ? Degenerative lumbar spondylosis with grade 2 spondylolisthesis L4 on L5 with bilater pars defects L4.   ? COVID-19 virus infection 02/06/2020  ? DDD (degenerative disc disease), lumbar   ? MRI 10/2015: grade I (14m) anterolisthesis L4 on L5, w/ disc herniation with encroachment on spinal nerves at L4-S1 levels.  ? Depression   ? GERD (gastroesophageal reflux disease)   ? worsened 07/2018 likely associated with lap band being too constricting->increased pantoprazole to 434mbid at that time.  ? History of chronic bronchitis   ? hx of when she was a smoker: ? mild intermittent asthma?--much better since stopped smoking 2018.  ? History of fracture of phalanx of finger   ? left, 4th finger distal phalanx  ? History of non anemic vitamin B12 deficiency   ? s/p lap band surgery per pt report.  ? Hyperlipidemia   ? Hypertension   ? off all bp meds x 3-4 years  ? Iron deficiency anemia 01/2019  ? hemoccults ordered but never turned in (01/2019-01/2020). Iron supp started 02/2020. Hemoccults neg 07/2020,  plan for iron infusion. 08/2020 EGD with SB bx normal, no source of IDA seen on colonoscopy. Iron infusion 09/06/2020  ? LAP-BAND surgery status   ? Pt getting lap band removed and is getting gastric sleave after.  ? Migraine syndrome   ? Ovarian cyst 10/2015  ? LEFT, 4 cm--noted on L spine MRI w/out contrast.  F/u u/s imaging showed simple cyst.  ? Spondylarthrosis   ? Tobacco dependence in remission   ? quit 2018  ? ? ?Past Surgical History:  ?Procedure Laterality Date  ? BACK SURGERY  2019  ? CARPAL TUNNEL RELEASE Left   ? CERVICAL CONE BIOPSY    ? COLONOSCOPY    ? 2015 normal.  08/2020 (for IDA)->incomplete prep, no adenomas->rpt 5 yrs d/t incomplete prep.  ? ESOPHAGOGASTRODUODENOSCOPY N/A 04/27/2014  ? Procedure: ESOPHAGOGASTRODUODENOSCOPY (EGD);  Surgeon: DaAlphonsa OverallMD;  [facility];  Service: General;  Laterality: N/A;  ? ESOPHAGOGASTRODUODENOSCOPY  08/29/2020  ? 6 cm hiatal hernia noted, o/w normal, SB bx neg.  ?hernia contributing to pt's IDA?  ? GASTRIC BANDING PORT REVISION N/A 10/02/2014  ? Procedure: LAPROSCOPIC PLACEMENT OF LAP BAND PORT;  Surgeon: BeExcell SeltzerMD;  Location: [facility];  Service: General;  Laterality: N/A;  ? LAPAROSCOPIC GASTRIC BANDING  03/2007  ? [facility]  Dr Oct[facility]  ? LAPAROSCOPIC REVISION OF GASTRIC BAND N/A 05/17/2012  ? Procedure: LAPAROSCOPIC REVISION OF SLIPPED GASTRIC BAND;  Surgeon: Edward Jolly, MD;  Location: [facility];  Service: General;  Laterality: N/A;  ? LAPAROSCOPY N/A 04/30/2014  ? Procedure: DIAGNOSTIC LAPAROSCOPY WITH REMOVAL OF INFECTED LAP BAND PORT WITH DEBRIDEMENT SUBCUTANEOUS ABSCESS;  Surgeon: Greer Pickerel, MD;  Location: [facility];  Service: General;  Laterality: N/A;  ? VARICOSE VEIN SURGERY Bilateral 2009  ? ? ?Outpatient Medications Prior to Visit  ?Medication Sig Dispense Refill  ? albuterol (VENTOLIN HFA) 108 (90 Base) MCG/ACT inhaler INHALE 2 PUFFS INTO THE LUNGS EVERY 4 (FOUR) HOURS AS NEEDED FOR WHEEZING  OR SHORTNESS OF BREATH. 8 g 0  ? amoxicillin-clavulanate (AUGMENTIN) 875-125 MG tablet Take 1 tablet by mouth every 12 (twelve) hours. 14 tablet 0  ? baclofen (LIORESAL) 10 MG tablet Take 10 mg by mouth every 8 (eight) hours.    ? cetirizine (ZYRTEC) 10 MG tablet Take 20 mg by mouth daily as needed for allergies (allergies).    ? diclofenac (VOLTAREN) 75 MG EC tablet Take 1 tablet by mouth 2 (two) times daily.     ? doxycycline (VIBRAMYCIN) 100 MG capsule Take 1 capsule (100 mg total) by mouth 2 (two) times daily. 14 capsule 0  ? DULoxetine (CYMBALTA) 60 MG capsule Take 1 capsule (60 mg total) by mouth daily. 90 capsule 3  ? gabapentin (NEURONTIN) 800 MG tablet Take 800 mg by mouth 3 (three) times daily.  6  ? lisinopril (ZESTRIL) 40 MG tablet TAKE 1 TABLET BY MOUTH EVERY DAY (Samantha Doyle taking differently: Take 40 mg by mouth daily.) 90 tablet 0  ? methylphenidate (CONCERTA) 18 MG PO CR tablet Take 1 tablet (18 mg total) by mouth daily. 30 tablet 0  ? Multiple Vitamin (MULITIVITAMIN WITH MINERALS) TABS Take 1 tablet by mouth daily.    ? oxyCODONE (ROXICODONE) 15 MG immediate release tablet Take 15 mg by mouth every 6 (six) hours as needed for pain.    ? pantoprazole (PROTONIX) 40 MG tablet TAKE 1 TABLET (40 MG TOTAL) BY MOUTH 2 (TWO) TIMES DAILY. OFFICE VISIT NEEDED FOR FURTHER REFILLS (Samantha Doyle taking differently: Take 40 mg by mouth 2 (two) times daily.) 180 tablet 0  ? rizatriptan (MAXALT-MLT) 10 MG disintegrating tablet Take 1 tablet (10 mg total) by mouth as needed for migraine. May repeat in 2 hours if needed 10 tablet 1  ? clonazePAM (KLONOPIN) 0.5 MG tablet TAKE 1 TABLET BY MOUTH TWICE A DAY AS NEEDED (Samantha Doyle not taking: Reported on 12/10/2020) 180 tablet 0  ? NARCAN 4 MG/0.1ML LIQD nasal spray kit USE ONE SPRAY INTRANASAL EVERY TWO TO THREE MINUTES UNTIL EMERGENCY TEAM ARRIVES AS DIRECTED, USE IN OPOID EMERGENCY ONLY (Samantha Doyle not taking: Reported on 06/18/2020)    ? ?No facility-administered medications prior  to visit.  ? ? ?Allergies  ?Allergen Reactions  ? Vancomycin Itching and Anaphylaxis  ?  Face/back warm and red.  Pt lips and tongue swelled, hives  ? Demerol Hives and Nausea And Vomiting  ? Demerol  [Meperidine Hcl] Other (See Comments)  ? Meperidine Nausea And Vomiting and Hives  ? Microplegia Msa-Msg  [Plegisol]   ?  Other reaction(s): headache  ? Monosodium Glutamate Nausea And Vomiting and Other (See Comments)  ?  migraines ?Other reaction(s): headache  ? Tape Rash  ?  Plastic tape  ? ? ?ROS ?As per HPI ? ?PE: ? ?  04/16/2021  ? 11:17 AM 04/08/2021  ?  10:15 PM 04/08/2021  ?  8:39 PM  ?Vitals with BMI  ?Height 5' 2.75"    ?Weight 246 lbs 10 oz    ?BMI 44.02    ?Systolic 116 579   ?Diastolic 83 71   ?Pulse 68 92 91  ?02 sat 97% ? ?Physical Exam ? ?Gen: Alert, well appearing.  Samantha Doyle is oriented to person, place, time, and situation. ?AFFECT: pleasant, lucid thought and speech. ?CV: RRR, no m/r/g.   ?LUNGS: CTA bilat, nonlabored resps, good aeration in all lung fields. ?EXT: no clubbing or cyanosis.  Trace pitting edema + mild/mod lymphedema. Scattered non-inflamed varicosities bilat LL's. ? ? ?LABS:  ?Last CBC ?Lab Results  ?Component Value Date  ? WBC 12.9 (H) 04/08/2021  ? HGB 10.5 (L) 04/08/2021  ? HCT 34.2 (L) 04/08/2021  ? MCV 80.5 04/08/2021  ? MCH 24.7 (L) 04/08/2021  ? RDW 16.1 (H) 04/08/2021  ? PLT 219 04/08/2021  ? ?Lab Results  ?Component Value Date  ? IRON 40 12/10/2020  ? TIBC 376 12/10/2020  ? FERRITIN 9 (L) 12/10/2020  ? ?Last metabolic panel ?Lab Results  ?Component Value Date  ? GLUCOSE 87 04/08/2021  ? NA 138 04/08/2021  ? K 3.4 (L) 04/08/2021  ? CL 102 04/08/2021  ? CO2 28 04/08/2021  ? BUN 5 (L) 04/08/2021  ? CREATININE 0.71 04/08/2021  ? GFRNONAA >60 04/08/2021  ? CALCIUM 8.8 (L) 04/08/2021  ? PROT 6.6 04/08/2021  ? ALBUMIN 3.2 (L) 04/08/2021  ? BILITOT 0.7 04/08/2021  ? ALKPHOS 53 04/08/2021  ? AST 21 04/08/2021  ? ALT 17 04/08/2021  ? ANIONGAP 8 04/08/2021  ? ?IMPRESSION AND  PLAN: ? ?Community-acquired pneumonia. ?Improving appropriately on antibiotics. ?Finish last 2 days of Augmentin and doxycycline. ?Okay to return to work. ?Gradually increase activity as tolerated. ?Okay to try OTC cough s

## 2021-04-18 ENCOUNTER — Encounter: Payer: Self-pay | Admitting: Family Medicine

## 2021-04-19 MED ORDER — METHYLPHENIDATE HCL ER (OSM) 27 MG PO TBCR: 27.0000 mg | EXTENDED_RELEASE_TABLET | ORAL | 0 refills | Status: DC

## 2021-04-19 NOTE — Telephone Encounter (Signed)
Okay I sent in the methylphenidate 27 mg dose. ?

## 2021-05-15 DIAGNOSIS — M961 Postlaminectomy syndrome, not elsewhere classified: Secondary | ICD-10-CM | POA: Diagnosis not present

## 2021-05-15 DIAGNOSIS — M5106 Intervertebral disc disorders with myelopathy, lumbar region: Secondary | ICD-10-CM | POA: Diagnosis not present

## 2021-05-15 DIAGNOSIS — M791 Myalgia, unspecified site: Secondary | ICD-10-CM | POA: Diagnosis not present

## 2021-05-15 DIAGNOSIS — G894 Chronic pain syndrome: Secondary | ICD-10-CM | POA: Diagnosis not present

## 2021-05-15 DIAGNOSIS — M5136 Other intervertebral disc degeneration, lumbar region: Secondary | ICD-10-CM | POA: Diagnosis not present

## 2021-05-15 DIAGNOSIS — Z79891 Long term (current) use of opiate analgesic: Secondary | ICD-10-CM | POA: Diagnosis not present

## 2021-05-25 ENCOUNTER — Encounter: Payer: Self-pay | Admitting: Family Medicine

## 2021-05-27 ENCOUNTER — Other Ambulatory Visit: Payer: Self-pay | Admitting: Family Medicine

## 2021-05-27 MED ORDER — METHYLPHENIDATE HCL ER (OSM) 27 MG PO TBCR: 27.0000 mg | EXTENDED_RELEASE_TABLET | ORAL | 0 refills | Status: DC

## 2021-05-27 NOTE — Telephone Encounter (Signed)
Pt was last seen 3/30 VV, refill completed 4/14(30,0). She was advised to f/u 1 month.   Please review and advise

## 2021-05-27 NOTE — Telephone Encounter (Signed)
I will do refill x30 days and will do 1 additional prescription to have on hold at pharmacy.  Recommend she see me in 2 months

## 2021-06-06 DIAGNOSIS — Z5181 Encounter for therapeutic drug level monitoring: Secondary | ICD-10-CM | POA: Diagnosis not present

## 2021-06-06 DIAGNOSIS — G894 Chronic pain syndrome: Secondary | ICD-10-CM | POA: Diagnosis not present

## 2021-06-06 DIAGNOSIS — Z79891 Long term (current) use of opiate analgesic: Secondary | ICD-10-CM | POA: Diagnosis not present

## 2021-06-19 ENCOUNTER — Other Ambulatory Visit: Payer: Self-pay | Admitting: Family Medicine

## 2021-06-21 ENCOUNTER — Other Ambulatory Visit: Payer: Self-pay

## 2021-06-21 MED ORDER — PANTOPRAZOLE SODIUM 40 MG PO TBEC
40.0000 mg | DELAYED_RELEASE_TABLET | Freq: Two times a day (BID) | ORAL | 0 refills | Status: DC
Start: 2021-06-21 — End: 2021-07-19

## 2021-06-23 ENCOUNTER — Other Ambulatory Visit: Payer: Self-pay | Admitting: Family Medicine

## 2021-07-02 ENCOUNTER — Other Ambulatory Visit: Payer: Self-pay | Admitting: Family Medicine

## 2021-07-03 ENCOUNTER — Encounter: Payer: Self-pay | Admitting: Family Medicine

## 2021-07-03 ENCOUNTER — Other Ambulatory Visit: Payer: Self-pay | Admitting: Family Medicine

## 2021-07-03 MED ORDER — METHYLPHENIDATE HCL ER (OSM) 27 MG PO TBCR
27.0000 mg | EXTENDED_RELEASE_TABLET | ORAL | 0 refills | Status: DC
Start: 2021-07-03 — End: 2021-07-19

## 2021-07-03 NOTE — Telephone Encounter (Signed)
Methylphenidate prescription sent

## 2021-07-20 ENCOUNTER — Other Ambulatory Visit: Payer: Self-pay | Admitting: Family Medicine

## 2021-07-22 ENCOUNTER — Encounter: Payer: Self-pay | Admitting: Family Medicine

## 2021-07-22 ENCOUNTER — Ambulatory Visit (INDEPENDENT_AMBULATORY_CARE_PROVIDER_SITE_OTHER): Payer: BC Managed Care – PPO | Admitting: Family Medicine

## 2021-07-22 VITALS — BP 135/85 | HR 73 | Temp 98.9°F | Wt 246.6 lb

## 2021-07-22 DIAGNOSIS — F988 Other specified behavioral and emotional disorders with onset usually occurring in childhood and adolescence: Secondary | ICD-10-CM | POA: Diagnosis not present

## 2021-07-22 DIAGNOSIS — F411 Generalized anxiety disorder: Secondary | ICD-10-CM

## 2021-07-22 DIAGNOSIS — Z79899 Other long term (current) drug therapy: Secondary | ICD-10-CM

## 2021-07-22 DIAGNOSIS — D500 Iron deficiency anemia secondary to blood loss (chronic): Secondary | ICD-10-CM | POA: Diagnosis not present

## 2021-07-22 DIAGNOSIS — F3342 Major depressive disorder, recurrent, in full remission: Secondary | ICD-10-CM

## 2021-07-22 DIAGNOSIS — I10 Essential (primary) hypertension: Secondary | ICD-10-CM

## 2021-07-22 LAB — BASIC METABOLIC PANEL
BUN: 12 mg/dL (ref 6–23)
CO2: 29 mEq/L (ref 19–32)
Calcium: 9.1 mg/dL (ref 8.4–10.5)
Chloride: 104 mEq/L (ref 96–112)
Creatinine, Ser: 0.54 mg/dL (ref 0.40–1.20)
GFR: 109.61 mL/min (ref >60.00)
Glucose, Bld: 106 mg/dL — ABNORMAL HIGH (ref 70–99)
Potassium: 4.1 mEq/L (ref 3.5–5.1)
Sodium: 139 mEq/L (ref 135–145)

## 2021-07-22 LAB — CBC
HCT: 34.6 % — ABNORMAL LOW (ref 36.0–46.0)
Hemoglobin: 10.9 g/dL — ABNORMAL LOW (ref 12.0–15.0)
MCHC: 31.5 g/dL (ref 30.0–36.0)
MCV: 78.6 fl (ref 78.0–100.0)
Platelets: 180 10*3/uL (ref 150.0–400.0)
RBC: 4.4 Mil/uL (ref 3.87–5.11)
RDW: 19.4 % — ABNORMAL HIGH (ref 11.5–15.5)
WBC: 5.4 10*3/uL (ref 4.0–10.5)

## 2021-07-22 MED ORDER — LISINOPRIL 40 MG PO TABS: 40.0000 mg | ORAL_TABLET | Freq: Every day | ORAL | 1 refills | Status: DC

## 2021-07-22 MED ORDER — METHYLPHENIDATE HCL ER (OSM) 27 MG PO TBCR: 27.0000 mg | EXTENDED_RELEASE_TABLET | ORAL | 0 refills | Status: DC

## 2021-07-22 MED ORDER — DULOXETINE HCL 60 MG PO CPEP: ORAL_CAPSULE | ORAL | 1 refills | Status: DC

## 2021-07-22 MED ORDER — PANTOPRAZOLE SODIUM 40 MG PO TBEC: 40.0000 mg | DELAYED_RELEASE_TABLET | Freq: Two times a day (BID) | ORAL | 1 refills | Status: DC

## 2021-07-22 NOTE — Progress Notes (Signed)
OFFICE VISIT  07/22/2021  CC:  Chief Complaint  Patient presents with   Hypertension    RCI. Pt is fasting   Patient is a 46 y.o. female who presents for follow-up adult ADD, anx and dep, HTN.   INTERIM HX: We increased methylphenidate to 27 mg daily about 2 months ago. Samantha Doyle continues to do very well from a mood and anxiety standpoint. 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  Pt states all is going well with the med at current dosing, methylphenidate CR 27 mg daily: much improved focus, concentration, task completion.  Less frustration, better multitasking, less impulsivity and restlessness.  Mood is stable. No side effects from the medication.  No home blood pressure monitoring.  PMP AWARE reviewed today: most recent rx for methylphenidate 27 mg was filled 07/03/2021, #30, rx by me. No red flags.  ROS as above, plus--> no CP, no SOB, no wheezing, no cough, no dizziness, no HAs, no rashes, no melena/hematochezia.  No polyuria or polydipsia.  No myalgias or arthralgias.  No focal weakness, paresthesias, or tremors.  No acute vision or hearing abnormalities.  No dysuria or unusual/new urinary urgency or frequency.  No recent changes in lower legs. No n/v/d or abd pain.  No palpitations.    Past Medical History:  Diagnosis Date   Allergic rhinitis    Arthritis    Carpal tunnel syndrome    right wrist, numb all the time   Cervical dysplasia    Chronic back pain    Degenerative lumbar spondylosis with grade 2 spondylolisthesis L4 on L5 with bilater pars defects L4.    COVID-19 virus infection 02/06/2020   DDD (degenerative disc disease), lumbar    MRI 10/2015: grade I (49mm) anterolisthesis L4 on L5, w/ disc herniation with encroachment on spinal nerves at L4-S1 levels.   Depression    GERD (gastroesophageal reflux disease)    worsened 07/2018 likely associated with lap band being  too constricting->increased pantoprazole to $RemoveBeforeD'40mg'eLqrkiIaVJeiaQ$  bid at that time.   History of chronic bronchitis    hx of when she was a smoker: ? mild intermittent asthma?--much better since stopped smoking 2018.   History of fracture of phalanx of finger    left, 4th finger distal phalanx   History of non anemic vitamin B12 deficiency    s/p lap band surgery per pt report.   Hyperlipidemia    Hypertension    off all bp meds x 3-4 years   Iron deficiency anemia 01/2019   hemoccults ordered but never turned in (01/2019-01/2020). Iron supp started 02/2020. Hemoccults neg 07/2020, plan for iron infusion. 08/2020 EGD with SB bx normal, no source of IDA seen on colonoscopy. Iron infusion 09/06/2020   LAP-BAND surgery status    Pt getting lap band removed and is getting gastric sleave after.   Migraine syndrome    Ovarian cyst 10/2015   LEFT, 4 cm--noted on L spine MRI w/out contrast.  F/u u/s imaging showed simple cyst.   Spondylarthrosis    Tobacco dependence in remission    quit 2018    Past Surgical History:  Procedure Laterality Date   BACK SURGERY  2019   CARPAL TUNNEL RELEASE Left    CERVICAL CONE BIOPSY     COLONOSCOPY     2015 normal.  08/2020 (for IDA)->incomplete prep, no adenomas->rpt 5 yrs d/t incomplete prep.   ESOPHAGOGASTRODUODENOSCOPY N/A 04/27/2014  Procedure: ESOPHAGOGASTRODUODENOSCOPY (EGD);  Surgeon: Alphonsa Overall, MD;  [facility];  Service: General;  Laterality: N/A;   ESOPHAGOGASTRODUODENOSCOPY  08/29/2020   6 cm hiatal hernia noted, o/w normal, SB bx neg.  ?hernia contributing to pt's IDA?   GASTRIC BANDING PORT REVISION N/A 10/02/2014   Procedure: LAPROSCOPIC PLACEMENT OF LAP BAND PORT;  Surgeon: Excell Seltzer, MD;  Location: [facility];  Service: General;  Laterality: N/A;   LAPAROSCOPIC GASTRIC BANDING  03/2007   [facility] - [facility]; Dr Octavia Bruckner Hipp   LAPAROSCOPIC REVISION OF GASTRIC BAND N/A 05/17/2012   Procedure: LAPAROSCOPIC REVISION OF SLIPPED  GASTRIC BAND;  Surgeon: Edward Jolly, MD;  Location: [facility];  Service: General;  Laterality: N/A;   LAPAROSCOPY N/A 04/30/2014   Procedure: DIAGNOSTIC LAPAROSCOPY WITH REMOVAL OF INFECTED LAP BAND PORT WITH DEBRIDEMENT SUBCUTANEOUS ABSCESS;  Surgeon: Greer Pickerel, MD;  Location: [facility];  Service: General;  Laterality: N/A;   VARICOSE VEIN SURGERY Bilateral 2009    Outpatient Medications Prior to Visit  Medication Sig Dispense Refill   albuterol (VENTOLIN HFA) 108 (90 Base) MCG/ACT inhaler INHALE 2 PUFFS INTO THE LUNGS EVERY 4 (FOUR) HOURS AS NEEDED FOR WHEEZING OR SHORTNESS OF BREATH. 8 g 0   baclofen (LIORESAL) 20 MG tablet Take 10-20 mg by mouth every 8 (eight) hours.     cetirizine (ZYRTEC) 10 MG tablet Take 20 mg by mouth daily as needed for allergies (allergies).     clonazePAM (KLONOPIN) 0.5 MG tablet TAKE 1 TABLET BY MOUTH TWICE A DAY AS NEEDED 180 tablet 0   diclofenac (VOLTAREN) 75 MG EC tablet Take 1 tablet by mouth 2 (two) times daily.      DULoxetine (CYMBALTA) 60 MG capsule TAKE 1 CAPSULE BY MOUTH EVERY DAY 30 capsule 0   gabapentin (NEURONTIN) 800 MG tablet Take 800 mg by mouth 3 (three) times daily.  6   lisinopril (ZESTRIL) 40 MG tablet TAKE 1 TABLET BY MOUTH EVERY DAY 30 tablet 0   methylphenidate 27 MG PO CR tablet Take 1 tablet (27 mg total) by mouth every morning. 30 tablet 0   Multiple Vitamin (MULITIVITAMIN WITH MINERALS) TABS Take 1 tablet by mouth daily.     NARCAN 4 MG/0.1ML LIQD nasal spray kit      oxyCODONE (ROXICODONE) 15 MG immediate release tablet Take 15 mg by mouth every 6 (six) hours as needed for pain.     pantoprazole (PROTONIX) 40 MG tablet Take 1 tablet (40 mg total) by mouth 2 (two) times daily. OFFICE VISIT NEEDED FOR FURTHER REFILLS 60 tablet 0   rizatriptan (MAXALT-MLT) 10 MG disintegrating tablet Take 1 tablet (10 mg total) by mouth as needed for migraine. May repeat in 2 hours if needed 10 tablet 1   amoxicillin-clavulanate (AUGMENTIN) 875-125  MG tablet Take 1 tablet by mouth every 12 (twelve) hours. 14 tablet 0   baclofen (LIORESAL) 10 MG tablet Take 10 mg by mouth every 8 (eight) hours.     doxycycline (VIBRAMYCIN) 100 MG capsule Take 1 capsule (100 mg total) by mouth 2 (two) times daily. 14 capsule 0   No facility-administered medications prior to visit.    Allergies  Allergen Reactions   Vancomycin Itching and Anaphylaxis    Face/back warm and red.  Pt lips and tongue swelled, hives   Demerol Hives and Nausea And Vomiting   Demerol  [Meperidine Hcl] Other (See Comments)   Meperidine Nausea And Vomiting and Hives   Microplegia Msa-Msg  [Cardioplegia  Del Nido Formula]     Other reaction(s): headache   Monosodium Glutamate Nausea And Vomiting and Other (See Comments)    migraines Other reaction(s): headache   Tape Rash    Plastic tape    ROS As per HPI  PE:    07/22/2021    1:03 PM 04/16/2021   11:17 AM 04/08/2021   10:15 PM  Vitals with BMI  Height  5' 2.75"   Weight 246 lbs 10 oz 246 lbs 10 oz   BMI  40.98   Systolic 119 147 829  Diastolic 85 83 71  Pulse 73 68 92     Physical Exam  Gen: Alert, well appearing.  Patient is oriented to person, place, time, and situation.a AFFECT: pleasant, lucid thought and speech. No further exam today  LABS:  Last CBC Lab Results  Component Value Date   WBC 12.9 (H) 04/08/2021   HGB 10.5 (L) 04/08/2021   HCT 34.2 (L) 04/08/2021   MCV 80.5 04/08/2021   MCH 24.7 (L) 04/08/2021   RDW 16.1 (H) 04/08/2021   PLT 219 04/08/2021   Lab Results  Component Value Date   IRON 40 12/10/2020   TIBC 376 12/10/2020   FERRITIN 9 (L) 56/21/3086   Last metabolic panel Lab Results  Component Value Date   GLUCOSE 87 04/08/2021   NA 138 04/08/2021   K 3.4 (L) 04/08/2021   CL 102 04/08/2021   CO2 28 04/08/2021   BUN 5 (L) 04/08/2021   CREATININE 0.71 04/08/2021   GFRNONAA >60 04/08/2021   CALCIUM 8.8 (L) 04/08/2021   PROT 6.6 04/08/2021   ALBUMIN 3.2 (L) 04/08/2021    BILITOT 0.7 04/08/2021   ALKPHOS 53 04/08/2021   AST 21 04/08/2021   ALT 17 04/08/2021   ANIONGAP 8 04/08/2021   Last lipids Lab Results  Component Value Date   CHOL 189 02/02/2020   HDL 53.70 02/02/2020   LDLCALC 119 (H) 02/02/2020   TRIG 80.0 02/02/2020   CHOLHDL 4 02/02/2020   Last thyroid functions Lab Results  Component Value Date   TSH 1.86 02/02/2020   Last vitamin B12 and Folate Lab Results  Component Value Date   VHQIONGE95 284 03/21/2016   IMPRESSION AND PLAN:  #1 recurrent major depressive disorder and generalized anxiety disorder. Stable/doing well on duloxetine 60 mg a day.  She has clonazepam from a prescription in the remote past but says she never uses this medication and does not need new prescription.  #2 adult ADD. Doing very well on methylphenidate CR 27 mg a day. New prescription today, #30. Controlled substance contract up-to-date. We will get her most recent urine drug screen from her pain management physician's office.  #3 hypertension, well controlled on lisinopril 40 mg a day. Electrolytes and creatinine today.  #4 iron deficiency anemia. Menorrhagia. She has been taking 2 Flintstones multivitamin tablets daily.  She cannot tolerate a full adult iron dose. Monitor CBC and iron panel today.  An After Visit Summary was printed and given to the patient.  FOLLOW UP: No follow-ups on file. Next cpe 01/2021  Signed:  Crissie Sickles, MD           07/22/2021

## 2021-07-23 LAB — IRON,TIBC AND FERRITIN PANEL
%SAT: 76 % (calc) — ABNORMAL HIGH (ref 16–45)
Ferritin: 13 ng/mL — ABNORMAL LOW (ref 16–232)
Iron: 308 ug/dL — ABNORMAL HIGH (ref 40–190)
TIBC: 407 mcg/dL (calc) (ref 250–450)

## 2021-07-24 DIAGNOSIS — M791 Myalgia, unspecified site: Secondary | ICD-10-CM | POA: Diagnosis not present

## 2021-07-24 DIAGNOSIS — M961 Postlaminectomy syndrome, not elsewhere classified: Secondary | ICD-10-CM | POA: Diagnosis not present

## 2021-07-24 DIAGNOSIS — G894 Chronic pain syndrome: Secondary | ICD-10-CM | POA: Diagnosis not present

## 2021-07-24 DIAGNOSIS — M5136 Other intervertebral disc degeneration, lumbar region: Secondary | ICD-10-CM | POA: Diagnosis not present

## 2021-07-24 DIAGNOSIS — M5106 Intervertebral disc disorders with myelopathy, lumbar region: Secondary | ICD-10-CM | POA: Diagnosis not present

## 2021-07-24 DIAGNOSIS — M4726 Other spondylosis with radiculopathy, lumbar region: Secondary | ICD-10-CM | POA: Diagnosis not present

## 2021-07-24 DIAGNOSIS — Z79891 Long term (current) use of opiate analgesic: Secondary | ICD-10-CM | POA: Diagnosis not present

## 2021-08-03 ENCOUNTER — Other Ambulatory Visit: Payer: Self-pay | Admitting: Family Medicine

## 2021-08-06 DIAGNOSIS — M461 Sacroiliitis, not elsewhere classified: Secondary | ICD-10-CM | POA: Diagnosis not present

## 2021-08-06 DIAGNOSIS — G894 Chronic pain syndrome: Secondary | ICD-10-CM | POA: Diagnosis not present

## 2021-08-06 DIAGNOSIS — M791 Myalgia, unspecified site: Secondary | ICD-10-CM | POA: Diagnosis not present

## 2021-08-16 DIAGNOSIS — R0781 Pleurodynia: Secondary | ICD-10-CM | POA: Diagnosis not present

## 2021-08-16 DIAGNOSIS — R918 Other nonspecific abnormal finding of lung field: Secondary | ICD-10-CM | POA: Diagnosis not present

## 2021-08-26 ENCOUNTER — Other Ambulatory Visit: Payer: Self-pay

## 2021-08-26 NOTE — Telephone Encounter (Signed)
Requesting:  con[facility]a Contract: 07/19/21 UDS: 05/15/21 Last Visit: 07/22/21 Next Visit: advised to f/u 3 months Last Refill: 07/22/21(30,0)  Please Advise. Medication pending

## 2021-08-27 MED ORDER — METHYLPHENIDATE HCL ER (OSM) 27 MG PO TBCR: 27.0000 mg | EXTENDED_RELEASE_TABLET | ORAL | 0 refills | Status: DC

## 2021-09-03 ENCOUNTER — Other Ambulatory Visit: Payer: Self-pay | Admitting: Family Medicine

## 2021-09-30 DIAGNOSIS — Z79891 Long term (current) use of opiate analgesic: Secondary | ICD-10-CM | POA: Diagnosis not present

## 2021-09-30 DIAGNOSIS — G894 Chronic pain syndrome: Secondary | ICD-10-CM | POA: Diagnosis not present

## 2021-09-30 DIAGNOSIS — M5136 Other intervertebral disc degeneration, lumbar region: Secondary | ICD-10-CM | POA: Diagnosis not present

## 2021-09-30 DIAGNOSIS — M4726 Other spondylosis with radiculopathy, lumbar region: Secondary | ICD-10-CM | POA: Diagnosis not present

## 2021-09-30 DIAGNOSIS — M791 Myalgia, unspecified site: Secondary | ICD-10-CM | POA: Diagnosis not present

## 2021-09-30 DIAGNOSIS — M5106 Intervertebral disc disorders with myelopathy, lumbar region: Secondary | ICD-10-CM | POA: Diagnosis not present

## 2021-10-13 ENCOUNTER — Other Ambulatory Visit: Payer: Self-pay | Admitting: Family Medicine

## 2021-10-25 ENCOUNTER — Other Ambulatory Visit: Payer: Self-pay | Admitting: Family Medicine

## 2021-10-25 DIAGNOSIS — M1712 Unilateral primary osteoarthritis, left knee: Secondary | ICD-10-CM | POA: Diagnosis not present

## 2021-10-25 NOTE — Telephone Encounter (Signed)
Pt is due for 3 month f/u. She was last seen 07/22/21. Appt scheduled for 10/25. Advised this is not a guarantee the medication will be refilled prior. She does not take it on the weekends.  Requesting: methylphenidate Contract:  07/19/21 UDS: N/A Last Visit:07/22/21 Next Visit: 10/30/21 Last Refill: 08/27/21 (30,0)  Please Advise. Med pending

## 2021-10-25 NOTE — Telephone Encounter (Signed)
Pt is needing a refill for methylphenidate(Ritalin)

## 2021-10-28 MED ORDER — METHYLPHENIDATE HCL ER (OSM) 27 MG PO TBCR: 27.0000 mg | EXTENDED_RELEASE_TABLET | ORAL | 0 refills | Status: DC

## 2021-10-28 NOTE — Telephone Encounter (Signed)
Pt would like to know if appt be virtual, she has work meeting same day at 4.

## 2021-10-30 ENCOUNTER — Encounter: Payer: Self-pay | Admitting: Family Medicine

## 2021-10-30 ENCOUNTER — Telehealth: Payer: Self-pay

## 2021-10-30 ENCOUNTER — Telehealth (INDEPENDENT_AMBULATORY_CARE_PROVIDER_SITE_OTHER): Payer: BC Managed Care – PPO | Admitting: Family Medicine

## 2021-10-30 VITALS — Temp 97.8°F | Wt 244.0 lb

## 2021-10-30 DIAGNOSIS — F988 Other specified behavioral and emotional disorders with onset usually occurring in childhood and adolescence: Secondary | ICD-10-CM | POA: Diagnosis not present

## 2021-10-30 DIAGNOSIS — D509 Iron deficiency anemia, unspecified: Secondary | ICD-10-CM

## 2021-10-30 NOTE — Progress Notes (Signed)
Virtual Visit via Video Note  I connected with Samantha Doyle on 10/30/21 at  3:40 PM EDT by a video enabled telemedicine application and verified that I am speaking with the correct person using two identifiers.  Location patient: [place] Location provider:work or home office Persons participating in the virtual visit: patient, provider  I discussed the limitations and requested verbal permission for telemedicine visit. The patient expressed understanding and agreed to proceed.  I last saw her 07/22/21. A/P as of last visit: "#1 recurrent major depressive disorder and generalized anxiety disorder. Stable/doing well on duloxetine 60 mg a day.  She has clonazepam from a prescription in the remote past but says she never uses this medication and does not need new prescription.   #2 adult ADD. Doing very well on methylphenidate CR 27 mg a day. New prescription today, #30. Controlled substance contract up-to-date. We will get her most recent urine drug screen from her pain management physician's office.   #3 hypertension, well controlled on lisinopril 40 mg a day. Electrolytes and creatinine today.   #4 iron deficiency anemia. Menorrhagia. She has been taking 2 Flintstones multivitamin tablets daily.  She cannot tolerate a full adult iron dose. Monitor CBC and iron panel today."  INTERIM HX: 46 year old female being seen today for 65-month follow-up ADD.  [name] reports that things are going well.  The methylphenidate CR 27 mg dose lasts approximately 8 to 9 hours. This is not an optimal duration of action but the efficacy is good. No side effects.  This is helping significantly with focus and her work, home, and social life.  Overall level of functioning markedly improved on this medication. Mood and anxiety levels stable.  She notes inability to tolerate iron tablet. She can take a multivitamin with iron and takes 2 a day currently. She notes no melena, hematochezia, or vaginal  bleeding.   Iron levels and hemoglobin still low at last visit but stable.     ROS: See pertinent positives and negatives per HPI.  Past Medical History:  Diagnosis Date   Allergic rhinitis    Arthritis    Carpal tunnel syndrome    right wrist, numb all the time   Cervical dysplasia    Chronic back pain    Degenerative lumbar spondylosis with grade 2 spondylolisthesis L4 on L5 with bilater pars defects L4.    COVID-19 virus infection 02/06/2020   DDD (degenerative disc disease), lumbar    MRI 10/2015: grade I (18mm) anterolisthesis L4 on L5, w/ disc herniation with encroachment on spinal nerves at L4-S1 levels.   Depression    GERD (gastroesophageal reflux disease)    worsened 07/2018 likely associated with lap band being too constricting->increased pantoprazole to $RemoveBeforeD'40mg'[id number]$  bid at that time.   History of chronic bronchitis    hx of when she was a smoker: ? mild intermittent asthma?--much better since stopped smoking 2018.   History of fracture of phalanx of finger    left, 4th finger distal phalanx   History of non anemic vitamin B12 deficiency    s/p lap band surgery per pt report.   Hyperlipidemia    Hypertension    off all bp meds x 3-4 years   Iron deficiency anemia 01/2019   hemoccults ordered but never turned in (01/2019-01/2020). Iron supp started 02/2020. Hemoccults neg 07/2020, plan for iron infusion. 08/2020 EGD with SB bx normal, no source of IDA seen on colonoscopy. Iron infusion 09/06/2020   LAP-BAND surgery status    Pt getting lap band  removed and is getting gastric sleave after.   Migraine syndrome    Ovarian cyst 10/2015   LEFT, 4 cm--noted on L spine MRI w/out contrast.  F/u u/s imaging showed simple cyst.   Spondylarthrosis    Tobacco dependence in remission    quit 2018    Past Surgical History:  Procedure Laterality Date   BACK SURGERY  2019   CARPAL TUNNEL RELEASE Left    CERVICAL CONE BIOPSY     COLONOSCOPY     2015 normal.  08/2020 (for IDA)->incomplete  prep, no adenomas->rpt 5 yrs d/t incomplete prep.   ESOPHAGOGASTRODUODENOSCOPY N/A 04/27/2014   Procedure: ESOPHAGOGASTRODUODENOSCOPY (EGD);  Surgeon: Alphonsa Overall, MD;  Location: [facility];  Service: General;  Laterality: N/A;   ESOPHAGOGASTRODUODENOSCOPY  08/29/2020   6 cm hiatal hernia noted, o/w normal, SB bx neg.  ?hernia contributing to pt's IDA?   GASTRIC BANDING PORT REVISION N/A 10/02/2014   Procedure: LAPROSCOPIC PLACEMENT OF LAP BAND PORT;  Surgeon: Excell Seltzer, MD;  Location: [facility];  Service: General;  Laterality: N/A;   LAPAROSCOPIC GASTRIC BANDING  03/2007   [facility] - [facility]; Dr Octavia Bruckner Hipp   LAPAROSCOPIC REVISION OF GASTRIC BAND N/A 05/17/2012   Procedure: LAPAROSCOPIC REVISION OF SLIPPED GASTRIC BAND;  Surgeon: Edward Jolly, MD;  Location: [facility];  Service: General;  Laterality: N/A;   LAPAROSCOPY N/A 04/30/2014   Procedure: DIAGNOSTIC LAPAROSCOPY WITH REMOVAL OF INFECTED LAP BAND PORT WITH DEBRIDEMENT SUBCUTANEOUS ABSCESS;  Surgeon: Greer Pickerel, MD;  Location: [facility];  Service: General;  Laterality: N/A;   VARICOSE VEIN SURGERY Bilateral 2009     Current Outpatient Medications:    baclofen (LIORESAL) 20 MG tablet, Take 10-20 mg by mouth every 8 (eight) hours., Disp: , Rfl:    cetirizine (ZYRTEC) 10 MG tablet, Take 20 mg by mouth daily as needed for allergies (allergies)., Disp: , Rfl:    diclofenac (VOLTAREN) 75 MG EC tablet, Take 1 tablet by mouth 2 (two) times daily. , Disp: , Rfl:    DULoxetine (CYMBALTA) 60 MG capsule, TAKE 1 CAPSULE BY MOUTH EVERY DAY, Disp: 90 capsule, Rfl: 1   gabapentin (NEURONTIN) 800 MG tablet, Take 800 mg by mouth in the morning, at noon, in the evening, and at bedtime., Disp: , Rfl: 6   lisinopril (ZESTRIL) 40 MG tablet, Take 1 tablet (40 mg total) by mouth daily., Disp: 90 tablet, Rfl: 1   methylphenidate 27 MG PO CR tablet, Take 1 tablet (27 mg total) by mouth every morning., Disp: 30 tablet, Rfl: 0   Multiple  Vitamin (MULITIVITAMIN WITH MINERALS) TABS, Take 1 tablet by mouth daily., Disp: , Rfl:    oxyCODONE (ROXICODONE) 15 MG immediate release tablet, Take 15 mg by mouth every 6 (six) hours as needed for pain., Disp: , Rfl:    pantoprazole (PROTONIX) 40 MG tablet, Take 1 tablet (40 mg total) by mouth 2 (two) times daily., Disp: 180 tablet, Rfl: 1   rizatriptan (MAXALT-MLT) 10 MG disintegrating tablet, TAKE 1 TABLET BY MOUTH AS NEEDED FOR MIGRAINE. MAY REPEAT IN 2 HOURS IF NEEDED, Disp: 10 tablet, Rfl: 0   albuterol (VENTOLIN HFA) 108 (90 Base) MCG/ACT inhaler, INHALE 2 PUFFS INTO THE LUNGS EVERY 4 (FOUR) HOURS AS NEEDED FOR WHEEZING OR SHORTNESS OF BREATH. (Patient not taking: Reported on 10/30/2021), Disp: 8 g, Rfl: 0   NARCAN 4 MG/0.1ML LIQD nasal spray kit, , Disp: , Rfl:   EXAM:  VITALS per patient if applicable:  10/30/2021    3:39 PM 07/22/2021    1:03 PM 04/16/2021   11:17 AM  Vitals with BMI  Height   5' 2.75"  Weight 244 lbs 246 lbs 10 oz 246 lbs 10 oz  BMI   45.80  Systolic  998 338  Diastolic  85 83  Pulse  73 68    GENERAL: alert, oriented, appears well and in no acute distress  HEENT: atraumatic, conjunttiva clear, no obvious abnormalities on inspection of external nose and ears  NECK: normal movements of the head and neck  LUNGS: on inspection no signs of respiratory distress, breathing rate appears normal, no obvious gross SOB, gasping or wheezing  CV: no obvious cyanosis  MS: moves all visible extremities without noticeable abnormality  PSYCH/NEURO: pleasant and cooperative, no obvious depression or anxiety, speech and thought processing grossly intact  LABS: none today    Chemistry      Component Value Date/Time   NA 139 07/22/2021 1339   NA 140 08/19/2020 0000   K 4.1 07/22/2021 1339   CL 104 07/22/2021 1339   CO2 29 07/22/2021 1339   BUN 12 07/22/2021 1339   BUN 13 08/19/2020 0000   CREATININE 0.54 07/22/2021 1339   CREATININE 0.61 08/07/2017 1508    GLU 83 08/19/2020 0000      Component Value Date/Time   CALCIUM 9.1 07/22/2021 1339   ALKPHOS 53 04/08/2021 1715   AST 21 04/08/2021 1715   ALT 17 04/08/2021 1715   BILITOT 0.7 04/08/2021 1715     Lab Results  Component Value Date   WBC 5.4 07/22/2021   HGB 10.9 (L) 07/22/2021   HCT 34.6 (L) 07/22/2021   MCV 78.6 07/22/2021   PLT 180.0 07/22/2021   Lab Results  Component Value Date   IRON 308 (H) 07/22/2021   TIBC 407 07/22/2021   FERRITIN 13 (L) 07/22/2021   ASSESSMENT AND PLAN:  Discussed the following assessment and plan:  #1 adult ADD. Doing well on methylphenidate CR 27 mg a day. In the future we will continue to consider adding a small dose of Ritalin in the latter part of her work shift.   #2 iron deficiency anemia, unknown etiology but suspect chronic malabsorption. GI work-up -2022. Unable to tolerate oral iron. Will repeat CBC and iron panel and arrange for iron infusion.  I discussed the assessment and treatment plan with the patient. The patient was provided an opportunity to ask questions and all were answered. The patient agreed with the plan and demonstrated an understanding of the instructions.   F/u: 3 months.  Signed:  Crissie Sickles, MD           10/30/2021

## 2021-10-30 NOTE — Telephone Encounter (Signed)
[  3:57 PM] McGowen, Doren Custard Can you call [facility] and set up nonfasting lab appt for cbc and iron. I'll enter orders.  Also, after results come in then pls arrange iron infusion for her, dx iron deficiency anemia-thx  Lab visit scheduled

## 2021-10-31 ENCOUNTER — Other Ambulatory Visit: Payer: BC Managed Care – PPO

## 2021-11-01 ENCOUNTER — Other Ambulatory Visit (INDEPENDENT_AMBULATORY_CARE_PROVIDER_SITE_OTHER): Payer: BC Managed Care – PPO

## 2021-11-01 DIAGNOSIS — S8992XA Unspecified injury of left lower leg, initial encounter: Secondary | ICD-10-CM | POA: Diagnosis not present

## 2021-11-01 DIAGNOSIS — D509 Iron deficiency anemia, unspecified: Secondary | ICD-10-CM | POA: Diagnosis not present

## 2021-11-01 NOTE — Addendum Note (Signed)
Addended by: Beatrix Fetters on: 11/01/2021 01:04 PM   Modules accepted: Orders

## 2021-11-02 LAB — IRON,TIBC AND FERRITIN PANEL
%SAT: 43 % (calc) (ref 16–45)
Ferritin: 19 ng/mL (ref 16–232)
Iron: 172 ug/dL (ref 40–190)
TIBC: 400 mcg/dL (calc) (ref 250–450)

## 2021-11-02 LAB — CBC
HCT: 33 % — ABNORMAL LOW (ref 35.0–45.0)
Hemoglobin: 10.3 g/dL — ABNORMAL LOW (ref 11.7–15.5)
MCH: 24.8 pg — ABNORMAL LOW (ref 27.0–33.0)
MCHC: 31.2 g/dL — ABNORMAL LOW (ref 32.0–36.0)
MCV: 79.3 fL — ABNORMAL LOW (ref 80.0–100.0)
MPV: 11.9 fL (ref 7.5–12.5)
Platelets: 224 10*3/uL (ref 140–400)
RBC: 4.16 10*6/uL (ref 3.80–5.10)
RDW: 14.7 % (ref 11.0–15.0)
WBC: 7 10*3/uL (ref 3.8–10.8)

## 2021-11-05 DIAGNOSIS — M7918 Myalgia, other site: Secondary | ICD-10-CM | POA: Diagnosis not present

## 2021-11-05 DIAGNOSIS — M461 Sacroiliitis, not elsewhere classified: Secondary | ICD-10-CM | POA: Diagnosis not present

## 2021-11-08 ENCOUNTER — Telehealth: Payer: Self-pay

## 2021-11-08 DIAGNOSIS — D509 Iron deficiency anemia, unspecified: Secondary | ICD-10-CM

## 2021-11-08 NOTE — Telephone Encounter (Signed)
-----   Message from Tammi Sou, MD sent at 11/06/2021  5:10 PM EDT ----- Please notify patient that her iron level has come up a little bit but her hemoglobin is still moderately low at 10.3. She is unable to tolerate oral iron. Please arrange iron infusion, diagnosis iron deficiency anemia.

## 2021-11-11 ENCOUNTER — Other Ambulatory Visit: Payer: Self-pay | Admitting: Family Medicine

## 2021-11-11 ENCOUNTER — Other Ambulatory Visit: Payer: Self-pay

## 2021-11-11 DIAGNOSIS — D509 Iron deficiency anemia, unspecified: Secondary | ICD-10-CM

## 2021-11-12 ENCOUNTER — Telehealth: Payer: Self-pay | Admitting: Pharmacy Technician

## 2021-11-12 ENCOUNTER — Telehealth: Payer: Self-pay

## 2021-11-12 DIAGNOSIS — D509 Iron deficiency anemia, unspecified: Secondary | ICD-10-CM

## 2021-11-12 NOTE — Telephone Encounter (Signed)
Kim with [facility] called to request change to type of iron. Her insurance will likely deny Feraheme if pt has not tried Venofer.   Alternatives are Venofer 200mg  with 5 doses or 70mmg with 3 doses.  Please advise preferred.

## 2021-11-12 NOTE — Telephone Encounter (Signed)
Feraheme is non preferred medication for [employer] and will likely be denied if patient has not failed and or tried venofer.  Dr. McGowen/[facility] has been notified. Awaiting response.  Phone: [phone] Fax: [phone]

## 2021-11-12 NOTE — Telephone Encounter (Signed)
Please see other encounter sent to PCP.

## 2021-11-13 ENCOUNTER — Other Ambulatory Visit: Payer: Self-pay

## 2021-11-13 ENCOUNTER — Other Ambulatory Visit (HOSPITAL_COMMUNITY): Payer: Self-pay

## 2021-11-13 NOTE — Telephone Encounter (Signed)
Auth Submission: NO AUTH NEEDED Payer: [employer] Medication & CPT/J Code(s) submitted: Venofer (Iron Sucrose) J1756 Route of submission (phone, fax, portal):  Phone # Fax # Auth type: Buy/Bill Units/visits requested: X3 Reference number:  Approval from: 11/13/21 to 01/05/22 at [facility]

## 2021-11-13 NOTE — Telephone Encounter (Signed)
Venofer is fine

## 2021-11-13 NOTE — Telephone Encounter (Signed)
New order completed and faxed to Infusion center.

## 2021-11-20 ENCOUNTER — Ambulatory Visit (INDEPENDENT_AMBULATORY_CARE_PROVIDER_SITE_OTHER): Payer: BC Managed Care – PPO | Admitting: *Deleted

## 2021-11-20 VITALS — BP 128/75 | HR 69 | Temp 98.3°F | Resp 18 | Ht 63.0 in | Wt 250.6 lb

## 2021-11-20 DIAGNOSIS — D509 Iron deficiency anemia, unspecified: Secondary | ICD-10-CM | POA: Diagnosis not present

## 2021-11-20 MED ORDER — ACETAMINOPHEN 325 MG PO TABS
650.0000 mg | ORAL_TABLET | Freq: Once | ORAL | Status: AC
  Administered 2021-11-20: 650 mg via ORAL
  Filled 2021-11-20: qty 2

## 2021-11-20 MED ORDER — DIPHENHYDRAMINE HCL 25 MG PO CAPS
25.0000 mg | ORAL_CAPSULE | Freq: Once | ORAL | Status: AC
  Administered 2021-11-20: 25 mg via ORAL
  Filled 2021-11-20: qty 1

## 2021-11-20 MED ORDER — SODIUM CHLORIDE 0.9 % IV SOLN
300.0000 mg | Freq: Once | INTRAVENOUS | Status: AC
  Administered 2021-11-20: 300 mg via INTRAVENOUS
  Filled 2021-11-20: qty 15

## 2021-11-20 NOTE — Progress Notes (Signed)
Diagnosis: Iron Deficiency Anemia  Provider:  Chilton Greathouse MD  Procedure: Infusion  IV Type: Peripheral, IV Location: L Forearm  Venofer (Iron Sucrose), Dose: 300 mg  Infusion Start Time: 1148 am  Infusion Stop Time: 1341 pm  Post Infusion IV Care: Observation period completed and Peripheral IV Discontinued  Discharge: Condition: Good, Destination: Home . AVS provided to patient.   Performed by:  Forrest Moron, RN

## 2021-11-25 DIAGNOSIS — M5136 Other intervertebral disc degeneration, lumbar region: Secondary | ICD-10-CM | POA: Diagnosis not present

## 2021-11-25 DIAGNOSIS — G894 Chronic pain syndrome: Secondary | ICD-10-CM | POA: Diagnosis not present

## 2021-11-25 DIAGNOSIS — Z79891 Long term (current) use of opiate analgesic: Secondary | ICD-10-CM | POA: Diagnosis not present

## 2021-11-25 DIAGNOSIS — M791 Myalgia, unspecified site: Secondary | ICD-10-CM | POA: Diagnosis not present

## 2021-11-25 DIAGNOSIS — M4726 Other spondylosis with radiculopathy, lumbar region: Secondary | ICD-10-CM | POA: Diagnosis not present

## 2021-11-25 DIAGNOSIS — M5106 Intervertebral disc disorders with myelopathy, lumbar region: Secondary | ICD-10-CM | POA: Diagnosis not present

## 2021-11-27 ENCOUNTER — Ambulatory Visit (INDEPENDENT_AMBULATORY_CARE_PROVIDER_SITE_OTHER): Payer: BC Managed Care – PPO | Admitting: *Deleted

## 2021-11-27 VITALS — BP 135/79 | HR 58 | Temp 98.3°F | Resp 18 | Ht 63.0 in | Wt 252.2 lb

## 2021-11-27 DIAGNOSIS — D509 Iron deficiency anemia, unspecified: Secondary | ICD-10-CM

## 2021-11-27 MED ORDER — DIPHENHYDRAMINE HCL 25 MG PO CAPS: 25.0000 mg | ORAL_CAPSULE | Freq: Once | ORAL | Status: DC

## 2021-11-27 MED ORDER — SODIUM CHLORIDE 0.9 % IV SOLN
300.0000 mg | Freq: Once | INTRAVENOUS | Status: AC
  Administered 2021-11-27: 300 mg via INTRAVENOUS
  Filled 2021-11-27: qty 15

## 2021-11-27 MED ORDER — ACETAMINOPHEN 325 MG PO TABS: 650.0000 mg | ORAL_TABLET | Freq: Once | ORAL | Status: DC

## 2021-11-27 NOTE — Progress Notes (Signed)
Diagnosis: Iron Deficiency Anemia  Provider:  Chilton Greathouse MD  Procedure: Infusion  IV Type: Peripheral, IV Location: L Antecubital  Venofer (Iron Sucrose), Dose: 300 mg  Infusion Start Time: 1137 am  Infusion Stop Time: 1319 pm  Post Infusion IV Care: Observation period completed and Peripheral IV Discontinued  Discharge: Condition: Good, Destination: Home . AVS provided to patient.   Performed by:  Forrest Moron, RN

## 2021-12-04 ENCOUNTER — Ambulatory Visit (INDEPENDENT_AMBULATORY_CARE_PROVIDER_SITE_OTHER): Payer: BC Managed Care – PPO

## 2021-12-04 VITALS — BP 118/69 | HR 71 | Temp 98.2°F | Resp 18 | Ht 63.0 in | Wt 259.0 lb

## 2021-12-04 DIAGNOSIS — D509 Iron deficiency anemia, unspecified: Secondary | ICD-10-CM | POA: Diagnosis not present

## 2021-12-04 MED ORDER — SODIUM CHLORIDE 0.9 % IV SOLN
300.0000 mg | Freq: Once | INTRAVENOUS | Status: AC
  Administered 2021-12-04: 300 mg via INTRAVENOUS
  Filled 2021-12-04: qty 15

## 2021-12-04 MED ORDER — DIPHENHYDRAMINE HCL 25 MG PO CAPS: 25.0000 mg | ORAL_CAPSULE | Freq: Once | ORAL | Status: DC

## 2021-12-04 MED ORDER — ACETAMINOPHEN 325 MG PO TABS: 650.0000 mg | ORAL_TABLET | Freq: Once | ORAL | Status: DC

## 2021-12-04 NOTE — Progress Notes (Signed)
Diagnosis: Iron Deficiency Anemia  Provider:  Chilton Greathouse MD  Procedure: Infusion  IV Type: Peripheral, IV Location: L Forearm  Venofer (Iron Sucrose), Dose: 300 mg  Infusion Start Time: 1128  Infusion Stop Time: 1308  Post Infusion IV Care: Peripheral IV Discontinued  Discharge: Condition: Good, Destination: Home . AVS provided to patient.   Performed by:  Nat Math, RN

## 2021-12-11 ENCOUNTER — Other Ambulatory Visit: Payer: Self-pay

## 2021-12-11 NOTE — Telephone Encounter (Signed)
Requesting: methylphenidate Contract: 07/19/21 UDS: n/a Last Visit: 10/30/21 Next Visit: advised to f/u January Last Refill: 10/28/21 (30,0)  Please Advise. Med pending

## 2021-12-12 MED ORDER — METHYLPHENIDATE HCL ER (OSM) 27 MG PO TBCR: 27.0000 mg | EXTENDED_RELEASE_TABLET | ORAL | 0 refills | Status: DC

## 2021-12-12 NOTE — Telephone Encounter (Signed)
LM for pt regarding medication ?

## 2021-12-13 ENCOUNTER — Telehealth: Payer: Self-pay

## 2021-12-13 DIAGNOSIS — D509 Iron deficiency anemia, unspecified: Secondary | ICD-10-CM

## 2021-12-13 NOTE — Telephone Encounter (Signed)
Patient would like to know if she needs to come back for iron level repeat anytime soon. She just finished her last iron infusion dose last week.  Please Advise.

## 2021-12-13 NOTE — Telephone Encounter (Signed)
Pt advised. No lab visit scheduled yet. Lab orders complete

## 2021-12-13 NOTE — Telephone Encounter (Signed)
Pt advised refill sent. °

## 2021-12-13 NOTE — Addendum Note (Signed)
Addended by: Emi Holes D on: 12/13/2021 04:48 PM   Modules accepted: Orders

## 2021-12-13 NOTE — Telephone Encounter (Signed)
Next CBC and iron panel needs to be in 1 month, diagnosis iron deficiency anemia.

## 2021-12-17 ENCOUNTER — Other Ambulatory Visit: Payer: Self-pay | Admitting: Family Medicine

## 2021-12-31 ENCOUNTER — Other Ambulatory Visit: Payer: Self-pay | Admitting: Family Medicine

## 2022-01-09 ENCOUNTER — Encounter: Payer: Self-pay | Admitting: *Deleted

## 2022-01-09 ENCOUNTER — Telehealth: Payer: Self-pay | Admitting: *Deleted

## 2022-01-09 NOTE — Patient Instructions (Signed)
Visit Information  Thank you for taking time to visit with me today. Please don't hesitate to contact me if I can be of assistance to you.   Following are the goals we discussed today:   Goals Addressed             This Visit's Progress    COMPLETED: care coordination activity       Care Coordination Interventions: Reviewed medications with patient and discussed adherence with no needed refills Reviewed scheduled/upcoming provider appointments including sufficient transportation source Assessed social determinant of health barriers Educated on care management services with no needs indicated today. Pt receptive to adding contacts to her MyChart for any future care management needs.          Please call the care guide team at [phone] if you need to cancel or reschedule your appointment.   If you are experiencing a Mental Health or [place] or need someone to talk to, please call the Suicide and Crisis Lifeline: 988  Patient verbalizes understanding of instructions and care plan provided today and agrees to view in [place]. Active MyChart status and patient understanding of how to access instructions and care plan via MyChart confirmed with patient.     No further follow up required: No follow up needs   Raina Mina, RN Care Management Coordinator [place] Office [phone]

## 2022-01-09 NOTE — Patient Outreach (Signed)
  Care Coordination   Initial Visit Note   01/09/2022 Name: VICKII VOLLAND MRN: [id number] DOB: [birth year]  Sherie Don is a 47 y.o. year old female who sees McGowen, Adrian Blackwater, MD for primary care. I spoke with  Sherie Don by phone today.  What matters to the patients health and wellness today?  No needs    Goals Addressed             This Visit's Progress    COMPLETED: care coordination activity       Care Coordination Interventions: Reviewed medications with patient and discussed adherence with no needed refills Reviewed scheduled/upcoming provider appointments including sufficient transportation source Assessed social determinant of health barriers Educated on care management services with no needs indicated today. Pt receptive to adding contacts to her MyChart for any future care management needs.          SDOH assessments and interventions completed:  Yes  SDOH Interventions Today    Flowsheet Row Most Recent Value  SDOH Interventions   Food Insecurity Interventions Intervention Not Indicated  Housing Interventions Intervention Not Indicated  Transportation Interventions Intervention Not Indicated  Utilities Interventions Intervention Not Indicated        Care Coordination Interventions:  Yes, provided   Follow up plan: No further intervention required.   Encounter Outcome:  Pt. Visit Completed   Raina Mina, RN Care Management Coordinator [place] Office [phone]

## 2022-01-16 ENCOUNTER — Other Ambulatory Visit: Payer: Self-pay

## 2022-01-16 NOTE — Telephone Encounter (Signed)
Patient refill request.  [facility]  Patient requested mychart visit for her upcoming appt and she must have latest appt in the afternoon. Appt is scheduled for 1/22 at 4pm  Let me know if I need to change appt to office visit.  methylphenidate 27 MG PO CR tablet [[id number]]

## 2022-01-17 MED ORDER — METHYLPHENIDATE HCL ER (OSM) 27 MG PO TBCR: 27.0000 mg | EXTENDED_RELEASE_TABLET | ORAL | 0 refills | Status: DC

## 2022-01-17 NOTE — Telephone Encounter (Signed)
Requesting: Methylphenidate 27mg  Contract: 07/19/21 UDS:  n/a Last Visit: 10/30/21 Next Visit: 01/27/22 Last Refill:   Please Advise on refill and appt time 1/22  at 4pm virtual.

## 2022-01-17 NOTE — Telephone Encounter (Signed)
Prescription sent. Yes 1/22 at 4 PM is fine.

## 2022-01-20 NOTE — Telephone Encounter (Signed)
LVM for pt to return call.  Note: Please inform patient refill sent and no change needed for upcoming appt.

## 2022-01-22 ENCOUNTER — Other Ambulatory Visit: Payer: Self-pay | Admitting: Family Medicine

## 2022-01-22 DIAGNOSIS — G894 Chronic pain syndrome: Secondary | ICD-10-CM | POA: Diagnosis not present

## 2022-01-22 DIAGNOSIS — M791 Myalgia, unspecified site: Secondary | ICD-10-CM | POA: Diagnosis not present

## 2022-01-22 DIAGNOSIS — M4726 Other spondylosis with radiculopathy, lumbar region: Secondary | ICD-10-CM | POA: Diagnosis not present

## 2022-01-22 DIAGNOSIS — M961 Postlaminectomy syndrome, not elsewhere classified: Secondary | ICD-10-CM | POA: Diagnosis not present

## 2022-01-22 DIAGNOSIS — Z79891 Long term (current) use of opiate analgesic: Secondary | ICD-10-CM | POA: Diagnosis not present

## 2022-01-22 DIAGNOSIS — M5106 Intervertebral disc disorders with myelopathy, lumbar region: Secondary | ICD-10-CM | POA: Diagnosis not present

## 2022-01-22 NOTE — Telephone Encounter (Signed)
Noted  

## 2022-01-26 ENCOUNTER — Other Ambulatory Visit: Payer: Self-pay | Admitting: Family Medicine

## 2022-01-27 ENCOUNTER — Telehealth (INDEPENDENT_AMBULATORY_CARE_PROVIDER_SITE_OTHER): Payer: BC Managed Care – PPO | Admitting: Family Medicine

## 2022-01-27 ENCOUNTER — Encounter: Payer: Self-pay | Admitting: Family Medicine

## 2022-01-27 VITALS — BP 120/83 | Ht 63.0 in | Wt 250.0 lb

## 2022-01-27 DIAGNOSIS — D509 Iron deficiency anemia, unspecified: Secondary | ICD-10-CM

## 2022-01-27 DIAGNOSIS — F988 Other specified behavioral and emotional disorders with onset usually occurring in childhood and adolescence: Secondary | ICD-10-CM | POA: Diagnosis not present

## 2022-01-27 DIAGNOSIS — I10 Essential (primary) hypertension: Secondary | ICD-10-CM

## 2022-01-27 DIAGNOSIS — F411 Generalized anxiety disorder: Secondary | ICD-10-CM

## 2022-01-27 MED ORDER — LISINOPRIL 40 MG PO TABS: 40.0000 mg | ORAL_TABLET | Freq: Every day | ORAL | 1 refills | Status: DC

## 2022-01-27 MED ORDER — PANTOPRAZOLE SODIUM 40 MG PO TBEC: 40.0000 mg | DELAYED_RELEASE_TABLET | Freq: Two times a day (BID) | ORAL | 1 refills | Status: DC

## 2022-01-27 NOTE — Telephone Encounter (Signed)
Pt has virtual appt scheduled for today. Will refill during appt

## 2022-01-27 NOTE — Progress Notes (Signed)
Virtual Visit via Video Note  I connected with Samantha Doyle  on 01/27/22 at  4:00 PM EST by a video enabled telemedicine application and verified that I am speaking with the correct person using two identifiers.  Location patient: [place] Location provider:work or home office Persons participating in the virtual visit: patient, provider  I discussed the limitations and requested verbal permission for telemedicine visit. The patient expressed understanding and agreed to proceed.  CC: 47 year old female being seen today for 69-month follow-up adult ADD. A/P as of last visit: "#1 adult ADD. Doing well on methylphenidate CR 27 mg a day. In the future we will continue to consider adding a small dose of Ritalin in the latter part of her work shift.   #2 iron deficiency anemia, unknown etiology but suspect chronic malabsorption. GI work-up -2022. Unable to tolerate oral iron. Will repeat CBC and iron panel and arrange for iron infusion."  INTERIM HX:  Pt states all is going well with the med at current dosing--concerta 27 qd: much improved focus, concentration, task completion.  Less frustration, better multitasking, less impulsivity and restlessness.  Mood is stable. No side effects from the medication.  She has been stable long-term on Cymbalta 60 mg a day regarding her anxiety and mood.  However she thinks she has decreased emotional range and would like to decrease the dose at next refill.  Blood pressures have been normal.  PMP AWARE reviewed today: most recent rx for methylphenidate was filled 01/17/2022, # 30, rx by me. No red flags.  ROS as above, plus--> no fevers, no CP, no SOB, no wheezing, no cough, no dizziness, no HAs, no rashes, no melena/hematochezia.  No polyuria or polydipsia.  No myalgias or arthralgias.  No focal weakness, paresthesias, or tremors.  No acute vision or hearing abnormalities.  No dysuria or unusual/new urinary urgency or frequency.  No recent changes in lower  legs. No n/v/d or abd pain.  No palpitations.     Past Medical History:  Diagnosis Date   Allergic rhinitis    Arthritis    Carpal tunnel syndrome    right wrist, numb all the time   Cervical dysplasia    Chronic back pain    Degenerative lumbar spondylosis with grade 2 spondylolisthesis L4 on L5 with bilater pars defects L4.    COVID-19 virus infection 02/06/2020   DDD (degenerative disc disease), lumbar    MRI 10/2015: grade I (46mm) anterolisthesis L4 on L5, w/ disc herniation with encroachment on spinal nerves at L4-S1 levels.   Depression    GERD (gastroesophageal reflux disease)    worsened 07/2018 likely associated with lap band being too constricting->increased pantoprazole to 40mg  bid at that time.   History of chronic bronchitis    hx of when she was a smoker: ? mild intermittent asthma?--much better since stopped smoking 2018.   History of fracture of phalanx of finger    left, 4th finger distal phalanx   History of non anemic vitamin B12 deficiency    s/p lap band surgery per pt report.   Hyperlipidemia    Hypertension    off all bp meds x 3-4 years   Iron deficiency anemia 01/2019   hemoccults ordered but never turned in (01/2019-01/2020). Iron supp started 02/2020. Hemoccults neg 07/2020, plan for iron infusion. 08/2020 EGD with SB bx normal, no source of IDA seen on colonoscopy. Iron infusion 09/06/2020   LAP-BAND surgery status    Pt getting lap band removed and is getting gastric sleave  after.   Migraine syndrome    Ovarian cyst 10/2015   LEFT, 4 cm--noted on L spine MRI w/out contrast.  F/u u/s imaging showed simple cyst.   Spondylarthrosis    Tobacco dependence in remission    quit 2018    Past Surgical History:  Procedure Laterality Date   BACK SURGERY  2019   CARPAL TUNNEL RELEASE Left    CERVICAL CONE BIOPSY     COLONOSCOPY     2015 normal.  08/2020 (for IDA)->incomplete prep, no adenomas->rpt 5 yrs d/t incomplete prep.   ESOPHAGOGASTRODUODENOSCOPY N/A  04/27/2014   Procedure: ESOPHAGOGASTRODUODENOSCOPY (EGD);  Surgeon: Ovidio Kin, MD;  Location: [facility];  Service: General;  Laterality: N/A;   ESOPHAGOGASTRODUODENOSCOPY  08/29/2020   6 cm hiatal hernia noted, o/w normal, SB bx neg.  ?hernia contributing to pt's IDA?   GASTRIC BANDING PORT REVISION N/A 10/02/2014   Procedure: LAPROSCOPIC PLACEMENT OF LAP BAND PORT;  Surgeon: Glenna Fellows, MD;  Location: [facility];  Service: General;  Laterality: N/A;   LAPAROSCOPIC GASTRIC BANDING  03/2007   [facility] - [facility]; Dr Jorja Loa Hipp   LAPAROSCOPIC REVISION OF GASTRIC BAND N/A 05/17/2012   Procedure: LAPAROSCOPIC REVISION OF SLIPPED GASTRIC BAND;  Surgeon: Mariella Saa, MD;  Location: [facility];  Service: General;  Laterality: N/A;   LAPAROSCOPY N/A 04/30/2014   Procedure: DIAGNOSTIC LAPAROSCOPY WITH REMOVAL OF INFECTED LAP BAND PORT WITH DEBRIDEMENT SUBCUTANEOUS ABSCESS;  Surgeon: Gaynelle Adu, MD;  Location: [facility];  Service: General;  Laterality: N/A;   VARICOSE VEIN SURGERY Bilateral 2009     Current Outpatient Medications:    albuterol (VENTOLIN HFA) 108 (90 Base) MCG/ACT inhaler, INHALE 2 PUFFS INTO THE LUNGS EVERY 4 (FOUR) HOURS AS NEEDED FOR WHEEZING OR SHORTNESS OF BREATH. (Patient not taking: Reported on 10/30/2021), Disp: 8 g, Rfl: 0   baclofen (LIORESAL) 20 MG tablet, Take 10-20 mg by mouth every 8 (eight) hours., Disp: , Rfl:    cetirizine (ZYRTEC) 10 MG tablet, Take 20 mg by mouth daily as needed for allergies (allergies)., Disp: , Rfl:    diclofenac (VOLTAREN) 75 MG EC tablet, Take 1 tablet by mouth 2 (two) times daily. , Disp: , Rfl:    DULoxetine (CYMBALTA) 60 MG capsule, TAKE 1 CAPSULE BY MOUTH EVERY DAY, Disp: 30 capsule, Rfl: 0   gabapentin (NEURONTIN) 800 MG tablet, Take 800 mg by mouth in the morning, at noon, in the evening, and at bedtime., Disp: , Rfl: 6   lisinopril (ZESTRIL) 40 MG tablet, TAKE 1 TABLET BY MOUTH EVERY DAY, Disp: 30 tablet, Rfl: 0    methylphenidate 27 MG PO CR tablet, Take 1 tablet (27 mg total) by mouth every morning., Disp: 30 tablet, Rfl: 0   Multiple Vitamin (MULITIVITAMIN WITH MINERALS) TABS, Take 1 tablet by mouth daily., Disp: , Rfl:    NARCAN 4 MG/0.1ML LIQD nasal spray kit, , Disp: , Rfl:    oxyCODONE (ROXICODONE) 15 MG immediate release tablet, Take 15 mg by mouth every 6 (six) hours as needed for pain., Disp: , Rfl:    pantoprazole (PROTONIX) 40 MG tablet, TAKE 1 TABLET BY MOUTH TWICE A DAY, Disp: 60 tablet, Rfl: 0   rizatriptan (MAXALT-MLT) 10 MG disintegrating tablet, TAKE 1 TABLET BY MOUTH AS NEEDED FOR MIGRAINE. MAY REPEAT IN 2 HOURS IF NEEDED, Disp: 10 tablet, Rfl: 0  EXAM:  VITALS per patient if applicable:     12/04/2021    1:05 PM 12/04/2021   11:22  AM 11/27/2021    1:20 PM  Vitals with BMI  Height  5\' 3"    Weight  259 lbs   BMI  76.73   Systolic 419 379 024  Diastolic 69 62 79  Pulse 71 75 58     GENERAL: alert, oriented, appears well and in no acute distress  HEENT: atraumatic, conjunttiva clear, no obvious abnormalities on inspection of external nose and ears  NECK: normal movements of the head and neck  LUNGS: on inspection no signs of respiratory distress, breathing rate appears normal, no obvious gross SOB, gasping or wheezing  CV: no obvious cyanosis  MS: moves all visible extremities without noticeable abnormality  PSYCH/NEURO: pleasant and cooperative, no obvious depression or anxiety, speech and thought processing grossly intact  LABS: none today    Chemistry      Component Value Date/Time   NA 139 07/22/2021 1339   NA 140 08/19/2020 0000   K 4.1 07/22/2021 1339   CL 104 07/22/2021 1339   CO2 29 07/22/2021 1339   BUN 12 07/22/2021 1339   BUN 13 08/19/2020 0000   CREATININE 0.54 07/22/2021 1339   CREATININE 0.61 08/07/2017 1508   GLU 83 08/19/2020 0000      Component Value Date/Time   CALCIUM 9.1 07/22/2021 1339   ALKPHOS 53 04/08/2021 1715   AST 21  04/08/2021 1715   ALT 17 04/08/2021 1715   BILITOT 0.7 04/08/2021 1715     Lab Results  Component Value Date   WBC 7.0 11/01/2021   HGB 10.3 (L) 11/01/2021   HCT 33.0 (L) 11/01/2021   MCV 79.3 (L) 11/01/2021   PLT 224 11/01/2021   Lab Results  Component Value Date   IRON 172 11/01/2021   TIBC 400 11/01/2021   FERRITIN 19 11/01/2021   ASSESSMENT AND PLAN:  Discussed the following assessment and plan:  #1 adult ADD. Doing well on Concerta 27 mg a day.  2.  GAD.  Stable long-term on Cymbalta 60 mg a day. She would like to decrease dose to 30 mg a day at next refill.  She will call when she needs this done.  3.  Hypertension, well-controlled on lisinopril 40 mg a day. Electrolytes and creatinine ordered--Future.  #4 iron deficiency anemia, suspect due to malabsorption. Not tolerate oral iron. Most recent iron infusion was almost 2 months ago. She will return for lab visit for CBC and iron levels.  #5 morbid obesity, history of lap band procedure. She says she may be getting Lap-Band removal and then getting gastric sleeve procedure in the near future.  I discussed the assessment and treatment plan with the patient. The patient was provided an opportunity to ask questions and all were answered. The patient agreed with the plan and demonstrated an understanding of the instructions.   F/u: 3 mo  Signed:  Crissie Sickles, MD           01/27/2022

## 2022-03-06 ENCOUNTER — Other Ambulatory Visit: Payer: Self-pay

## 2022-03-06 MED ORDER — METHYLPHENIDATE HCL ER (OSM) 27 MG PO TBCR: 27.0000 mg | EXTENDED_RELEASE_TABLET | ORAL | 0 refills | Status: DC

## 2022-03-06 NOTE — Telephone Encounter (Signed)
Requesting: methylphenidate Contract: 07/19/21 UDS: n/a (req recs from Dr.Garvin) Last Visit: 01/27/22 Next Visit: advised 3 mo f/u Last Refill: 01/17/22 (30,0)  Please Advise. Med pending

## 2022-03-06 NOTE — Telephone Encounter (Signed)
Patient refill request.. [facility], [place]  methylphenidate 27 MG PO CR tablet

## 2022-03-07 NOTE — Telephone Encounter (Signed)
Pt advised refill sent. °

## 2022-03-12 DIAGNOSIS — M791 Myalgia, unspecified site: Secondary | ICD-10-CM | POA: Diagnosis not present

## 2022-03-12 DIAGNOSIS — G894 Chronic pain syndrome: Secondary | ICD-10-CM | POA: Diagnosis not present

## 2022-03-12 DIAGNOSIS — Z79891 Long term (current) use of opiate analgesic: Secondary | ICD-10-CM | POA: Diagnosis not present

## 2022-03-12 DIAGNOSIS — M4726 Other spondylosis with radiculopathy, lumbar region: Secondary | ICD-10-CM | POA: Diagnosis not present

## 2022-03-12 DIAGNOSIS — M5136 Other intervertebral disc degeneration, lumbar region: Secondary | ICD-10-CM | POA: Diagnosis not present

## 2022-03-12 DIAGNOSIS — M5106 Intervertebral disc disorders with myelopathy, lumbar region: Secondary | ICD-10-CM | POA: Diagnosis not present

## 2022-04-09 ENCOUNTER — Other Ambulatory Visit: Payer: Self-pay | Admitting: Family Medicine

## 2022-04-09 MED ORDER — METHYLPHENIDATE HCL ER (OSM) 27 MG PO TBCR: 27.0000 mg | EXTENDED_RELEASE_TABLET | ORAL | 0 refills | Status: DC

## 2022-04-09 NOTE — Telephone Encounter (Signed)
Pt is needing refill on methylphenidate 27 MG PO CR tablet. Pharmacy is confirmed and correct

## 2022-04-09 NOTE — Telephone Encounter (Signed)
Requesting: methylphenidate Contract: 07/19/21 UDS: n/a Last Visit: 01/27/22 Next Visit: 05/01/22 Last Refill: 03/06/22 (30,0)  Please Advise. Med pending

## 2022-05-01 ENCOUNTER — Ambulatory Visit: Payer: BC Managed Care – PPO | Admitting: Family Medicine

## 2022-05-01 ENCOUNTER — Telehealth: Payer: Self-pay

## 2022-05-01 NOTE — Telephone Encounter (Signed)
Pt was scheduled for 1pm f/u today in office. Tried calling pt to see if we can get her rescheduled for another time or day. If pt returns call, please assist with reschedule.

## 2022-05-01 NOTE — Progress Notes (Deleted)
OFFICE VISIT  05/01/2022  CC: No chief complaint on file.   Patient is a 47 y.o. female who presents for 37-month follow-up adult ADD, anxiety, and hypertension.. A/P as of last visit: "#1 adult ADD. Doing well on Concerta 27 mg a day.   2.  GAD.  Stable long-term on Cymbalta 60 mg a day. She would like to decrease dose to 30 mg a day at next refill.  She will call when she needs this done.   3.  Hypertension, well-controlled on lisinopril 40 mg a day. Electrolytes and creatinine ordered--Future.   #4 iron deficiency anemia, suspect due to malabsorption. Not tolerate oral iron. Most recent iron infusion was almost 2 months ago. She will return for lab visit for CBC and iron levels.   #5 morbid obesity, history of lap band procedure. She says she may be getting Lap-Band removal and then getting gastric sleeve procedure in the near future."  INTERIM HX: ***  PMP AWARE reviewed today: most recent rx for methylphenidate ER 27 mg was filled 04/09/22, # 30, rx by me. No red flags.   Past Medical History:  Diagnosis Date   Allergic rhinitis    Arthritis    Carpal tunnel syndrome    right wrist, numb all the time   Cervical dysplasia    Chronic back pain    Degenerative lumbar spondylosis with grade 2 spondylolisthesis L4 on L5 with bilater pars defects L4.    COVID-19 virus infection 02/06/2020   DDD (degenerative disc disease), lumbar    MRI 10/2015: grade I (7mm) anterolisthesis L4 on L5, w/ disc herniation with encroachment on spinal nerves at L4-S1 levels.   Depression    GERD (gastroesophageal reflux disease)    worsened 07/2018 likely associated with lap band being too constricting->increased pantoprazole to  bid at that time.   History of chronic bronchitis    hx of when she was a smoker: ? mild intermittent asthma?--much better since stopped smoking 2018.   History of fracture of phalanx of finger    left, 4th finger distal phalanx   History of non anemic vitamin  B12 deficiency    s/p lap band surgery per pt report.   Hyperlipidemia    Hypertension    off all bp meds x 3-4 years   Iron deficiency anemia 01/2019   hemoccults ordered but never turned in (01/2019-01/2020). Iron supp started 02/2020. Hemoccults neg 07/2020, plan for iron infusion. 08/2020 EGD with SB bx normal, no source of IDA seen on colonoscopy. Iron infusion 09/06/2020   LAP-BAND surgery status    Pt getting lap band removed and is getting gastric sleave after.   Migraine syndrome    Ovarian cyst 10/2015   LEFT, 4 cm--noted on L spine MRI w/out contrast.  F/u u/s imaging showed simple cyst.   Spondylarthrosis    Tobacco dependence in remission    quit 2018    Past Surgical History:  Procedure Laterality Date   BACK SURGERY  2019   CARPAL TUNNEL RELEASE Left    CERVICAL CONE BIOPSY     COLONOSCOPY     2015 normal.  08/2020 (for IDA)->incomplete prep, no adenomas->rpt 5 yrs d/t incomplete prep.   ESOPHAGOGASTRODUODENOSCOPY N/A 04/27/2014   Procedure: ESOPHAGOGASTRODUODENOSCOPY (EGD);  Surgeon: Ovidio Kin, MD;  Location: [facility];  Service: General;  Laterality: N/A;   ESOPHAGOGASTRODUODENOSCOPY  08/29/2020   6 cm hiatal hernia noted, o/w normal, SB bx neg.  ?hernia contributing to pt's IDA?   GASTRIC BANDING  PORT REVISION N/A 10/02/2014   Procedure: LAPROSCOPIC PLACEMENT OF LAP BAND PORT;  Surgeon: Glenna Fellows, MD;  Location: [facility];  Service: General;  Laterality: N/A;   LAPAROSCOPIC GASTRIC BANDING  03/2007   [facility]; Dr Jorja Loa Hipp   LAPAROSCOPIC REVISION OF GASTRIC BAND N/A 05/17/2012   Procedure: LAPAROSCOPIC REVISION OF SLIPPED GASTRIC BAND;  Surgeon: Mariella Saa, MD;  Location: [facility];  Service: General;  Laterality: N/A;   LAPAROSCOPY N/A 04/30/2014   Procedure: DIAGNOSTIC LAPAROSCOPY WITH REMOVAL OF INFECTED LAP BAND PORT WITH DEBRIDEMENT SUBCUTANEOUS ABSCESS;  Surgeon: Gaynelle Adu, MD;  Location: [facility];  Service: General;   Laterality: N/A;   VARICOSE VEIN SURGERY Bilateral 2009    Outpatient Medications Prior to Visit  Medication Sig Dispense Refill   SODIUM FLUORIDE, DENTAL RINSE, 0.2 % SOLN Take by mouth.     albuterol (VENTOLIN HFA) 108 (90 Base) MCG/ACT inhaler INHALE 2 PUFFS INTO THE LUNGS EVERY 4 (FOUR) HOURS AS NEEDED FOR WHEEZING OR SHORTNESS OF BREATH. 8 g 0   baclofen (LIORESAL) 20 MG tablet Take 10-20 mg by mouth every 8 (eight) hours.     cetirizine (ZYRTEC) 10 MG tablet Take 20 mg by mouth daily as needed for allergies (allergies).     diclofenac (VOLTAREN) 75 MG EC tablet Take 1 tablet by mouth 2 (two) times daily.      DULoxetine (CYMBALTA) 60 MG capsule TAKE 1 CAPSULE BY MOUTH EVERY DAY 30 capsule 0   gabapentin (NEURONTIN) 800 MG tablet Take 800 mg by mouth in the morning, at noon, in the evening, and at bedtime.  6   lisinopril (ZESTRIL) 40 MG tablet Take 1 tablet (40 mg total) by mouth daily. 90 tablet 1   methylphenidate 27 MG PO CR tablet Take 1 tablet (27 mg total) by mouth every morning. 30 tablet 0   Multiple Vitamin (MULITIVITAMIN WITH MINERALS) TABS Take 1 tablet by mouth daily.     NARCAN 4 MG/0.1ML LIQD nasal spray kit  (Patient not taking: Reported on 10/30/2021)     oxyCODONE (ROXICODONE) 15 MG immediate release tablet Take 15 mg by mouth every 6 (six) hours as needed for pain.     pantoprazole (PROTONIX) 40 MG tablet Take 1 tablet (40 mg total) by mouth 2 (two) times daily. 180 tablet 1   rizatriptan (MAXALT-MLT) 10 MG disintegrating tablet TAKE 1 TABLET BY MOUTH AS NEEDED FOR MIGRAINE. MAY REPEAT IN 2 HOURS IF NEEDED 10 tablet 0   No facility-administered medications prior to visit.    Allergies  Allergen Reactions   Vancomycin Itching and Anaphylaxis    Face/back warm and red.  Pt lips and tongue swelled, hives   Demerol Hives and Nausea And Vomiting   Demerol  [Meperidine Hcl] Other (See Comments)   Meperidine Nausea And Vomiting and Hives   Microplegia Msa-Msg   [Cardioplegia [place]ido Formula]     Other reaction(s): headache   Monosodium Glutamate Nausea And Vomiting and Other (See Comments)    migraines Other reaction(s): headache   Tape Rash    Plastic tape    Review of Systems As per HPI  PE:    01/27/2022    4:10 PM 12/04/2021    1:05 PM 12/04/2021   11:22 AM  Vitals with BMI  Height 5\' 3"   5\' 3"   Weight 250 lbs  259 lbs  BMI 44.3  45.89  Systolic 120 118 161  Diastolic 83 69 62  Pulse  71 75     Physical Exam  ***  LABS:  Last CBC Lab Results  Component Value Date   WBC 7.0 11/01/2021   HGB 10.3 (L) 11/01/2021   HCT 33.0 (L) 11/01/2021   MCV 79.3 (L) 11/01/2021   MCH 24.8 (L) 11/01/2021   RDW 14.7 11/01/2021   PLT 224 11/01/2021   Lab Results  Component Value Date   IRON 172 11/01/2021   TIBC 400 11/01/2021   FERRITIN 19 11/01/2021   Last metabolic panel Lab Results  Component Value Date   GLUCOSE 106 (H) 07/22/2021   NA 139 07/22/2021   K 4.1 07/22/2021   CL 104 07/22/2021   CO2 29 07/22/2021   BUN 12 07/22/2021   CREATININE 0.54 07/22/2021   GFRNONAA >60 04/08/2021   CALCIUM 9.1 07/22/2021   PROT 6.6 04/08/2021   ALBUMIN 3.2 (L) 04/08/2021   BILITOT 0.7 04/08/2021   ALKPHOS 53 04/08/2021   AST 21 04/08/2021   ALT 17 04/08/2021   ANIONGAP 8 04/08/2021   Last lipids Lab Results  Component Value Date   CHOL 189 02/02/2020   HDL 53.70 02/02/2020   LDLCALC 119 (H) 02/02/2020   TRIG 80.0 02/02/2020   CHOLHDL 4 02/02/2020   Last thyroid functions Lab Results  Component Value Date   TSH 1.86 02/02/2020   Last vitamin B12 and Folate Lab Results  Component Value Date   VITAMINB12 828 03/21/2016   IMPRESSION AND PLAN:  No problem-specific Assessment & Plan notes found for this encounter.  Needs screening mammogram Release labs ordered 01/27/2022  An After Visit Summary was printed and given to the patient.  FOLLOW UP: No follow-ups on file.  Signed:  Santiago Bumpers, MD            05/01/2022

## 2022-05-07 ENCOUNTER — Ambulatory Visit (INDEPENDENT_AMBULATORY_CARE_PROVIDER_SITE_OTHER): Payer: BC Managed Care – PPO | Admitting: Family Medicine

## 2022-05-07 ENCOUNTER — Telehealth: Payer: BC Managed Care – PPO | Admitting: Family Medicine

## 2022-05-07 ENCOUNTER — Encounter: Payer: Self-pay | Admitting: Family Medicine

## 2022-05-07 VITALS — BP 130/90 | HR 64 | Temp 98.2°F | Ht 63.0 in | Wt 266.0 lb

## 2022-05-07 DIAGNOSIS — Z79899 Other long term (current) drug therapy: Secondary | ICD-10-CM

## 2022-05-07 DIAGNOSIS — F988 Other specified behavioral and emotional disorders with onset usually occurring in childhood and adolescence: Secondary | ICD-10-CM | POA: Diagnosis not present

## 2022-05-07 DIAGNOSIS — F411 Generalized anxiety disorder: Secondary | ICD-10-CM

## 2022-05-07 DIAGNOSIS — I10 Essential (primary) hypertension: Secondary | ICD-10-CM | POA: Diagnosis not present

## 2022-05-07 MED ORDER — DULOXETINE HCL 30 MG PO CPEP
30.0000 mg | ORAL_CAPSULE | Freq: Every day | ORAL | 1 refills | Status: DC
Start: 2022-05-07 — End: 2022-10-30

## 2022-05-07 MED ORDER — METHYLPHENIDATE HCL ER (OSM) 27 MG PO TBCR: 27.0000 mg | EXTENDED_RELEASE_TABLET | ORAL | 0 refills | Status: DC

## 2022-05-07 NOTE — Progress Notes (Signed)
Office Note 05/07/2022  CC:  Chief Complaint  Patient presents with   Medical Management of Chronic Issues   Patient is a 47 y.o. female who is here for follow-up adult ADD, GAD, and hypertension. A/P as of last visit: "#1 adult ADD. Doing well on Concerta 27 mg a day.   2.  GAD.  Stable long-term on Cymbalta 60 mg a day. She would like to decrease dose to 30 mg a day at next refill.  She will call when she needs this done.   3.  Hypertension, well-controlled on lisinopril 40 mg a day. Electrolytes and creatinine ordered--Future.   #4 iron deficiency anemia, suspect due to malabsorption. Not tolerate oral iron. Most recent iron infusion was almost 2 months ago. She will return for lab visit for CBC and iron levels.   #5 morbid obesity, history of lap band procedure. She says she may be getting Lap-Band removal and then getting gastric sleeve procedure in the near future."  INTERIM HX: Doing well.  Ran out of Cymbalta 1 week ago.  She does feel a significant amount of anxiety and more labile emotions since then.  She wants to get back on it but at a lower dose.   Pt states all is going well with the med at current dosing (methylphenidate CR 27 mg daily): much improved focus, concentration, task completion.  Less frustration, better multitasking, less impulsivity and restlessness.  Mood is stable. No side effects from the medication.  Home blood pressures are consistently around 130/80 or better.  PMP AWARE reviewed today: most recent rx for Concerta 27 mg was filled 04/09/2022, # 30, rx by me. Her oxycodone and gabapentin are managed by her physician Dr. Oneal Grout. No red flags.  Not eating an ideal diet lately.  Lots of "[place] candy". Works hard but no formal exercise.  She is still planning on getting gastric sleeve procedure.  ROS as above, plus--> no fevers, no CP, no SOB, no wheezing, no cough, no dizziness, no HAs, no rashes, no melena/hematochezia.  No polyuria or  polydipsia.  No myalgias or arthralgias.  No focal weakness, paresthesias, or tremors.  No acute vision or hearing abnormalities.  No dysuria or unusual/new urinary urgency or frequency.  No recent changes in lower legs. No n/v/d or abd pain.  No palpitations.    Past Medical History:  Diagnosis Date   Allergic rhinitis    Arthritis    Carpal tunnel syndrome    right wrist, numb all the time   Cervical dysplasia    Chronic back pain    Degenerative lumbar spondylosis with grade 2 spondylolisthesis L4 on L5 with bilater pars defects L4.    COVID-19 virus infection 02/06/2020   DDD (degenerative disc disease), lumbar    MRI 10/2015: grade I (7mm) anterolisthesis L4 on L5, w/ disc herniation with encroachment on spinal nerves at L4-S1 levels.   Depression    GERD (gastroesophageal reflux disease)    worsened 07/2018 likely associated with lap band being too constricting->increased pantoprazole to 40mg  bid at that time.   History of chronic bronchitis    hx of when she was a smoker: ? mild intermittent asthma?--much better since stopped smoking 2018.   History of fracture of phalanx of finger    left, 4th finger distal phalanx   History of non anemic vitamin B12 deficiency    s/p lap band surgery per pt report.   Hyperlipidemia    Hypertension    off all bp meds x  3-4 years   Iron deficiency anemia 01/2019   hemoccults ordered but never turned in (01/2019-01/2020). Iron supp started 02/2020. Hemoccults neg 07/2020, plan for iron infusion. 08/2020 EGD with SB bx normal, no source of IDA seen on colonoscopy. Iron infusion 09/06/2020   LAP-BAND surgery status    Pt getting lap band removed and is getting gastric sleave after.   Migraine syndrome    Ovarian cyst 10/2015   LEFT, 4 cm--noted on L spine MRI w/out contrast.  F/u u/s imaging showed simple cyst.   Spondylarthrosis    Tobacco dependence in remission    quit 2018    Past Surgical History:  Procedure Laterality Date   BACK SURGERY   2019   CARPAL TUNNEL RELEASE Left    CERVICAL CONE BIOPSY     COLONOSCOPY     2015 normal.  08/2020 (for IDA)->incomplete prep, no adenomas->rpt 5 yrs d/t incomplete prep.   ESOPHAGOGASTRODUODENOSCOPY N/A 04/27/2014   Procedure: ESOPHAGOGASTRODUODENOSCOPY (EGD);  Surgeon: Ovidio Kin, MD;  Location: [facility];  Service: General;  Laterality: N/A;   ESOPHAGOGASTRODUODENOSCOPY  08/29/2020   6 cm hiatal hernia noted, o/w normal, SB bx neg.  ?hernia contributing to pt's IDA?   GASTRIC BANDING PORT REVISION N/A 10/02/2014   Procedure: LAPROSCOPIC PLACEMENT OF LAP BAND PORT;  Surgeon: Glenna Fellows, MD;  Location: [facility];  Service: General;  Laterality: N/A;   LAPAROSCOPIC GASTRIC BANDING  03/2007   [facility]; Dr Jorja Loa Hipp   LAPAROSCOPIC REVISION OF GASTRIC BAND N/A 05/17/2012   Procedure: LAPAROSCOPIC REVISION OF SLIPPED GASTRIC BAND;  Surgeon: Mariella Saa, MD;  Location: [facility];  Service: General;  Laterality: N/A;   LAPAROSCOPY N/A 04/30/2014   Procedure: DIAGNOSTIC LAPAROSCOPY WITH REMOVAL OF INFECTED LAP BAND PORT WITH DEBRIDEMENT SUBCUTANEOUS ABSCESS;  Surgeon: Gaynelle Adu, MD;  Location: [facility];  Service: General;  Laterality: N/A;   VARICOSE VEIN SURGERY Bilateral 2009    Outpatient Medications Prior to Visit  Medication Sig Dispense Refill   albuterol (VENTOLIN HFA) 108 (90 Base) MCG/ACT inhaler INHALE 2 PUFFS INTO THE LUNGS EVERY 4 (FOUR) HOURS AS NEEDED FOR WHEEZING OR SHORTNESS OF BREATH. 8 g 0   baclofen (LIORESAL) 20 MG tablet Take 10-20 mg by mouth every 8 (eight) hours.     cetirizine (ZYRTEC) 10 MG tablet Take 20 mg by mouth daily as needed for allergies (allergies).     diclofenac (VOLTAREN) 75 MG EC tablet Take 1 tablet by mouth 2 (two) times daily.      DULoxetine (CYMBALTA) 60 MG capsule TAKE 1 CAPSULE BY MOUTH EVERY DAY 30 capsule 0   gabapentin (NEURONTIN) 800 MG tablet Take 800 mg by mouth in the morning, at noon, in the evening,  and at bedtime.  6   lisinopril (ZESTRIL) 40 MG tablet Take 1 tablet (40 mg total) by mouth daily. 90 tablet 1   methylphenidate 27 MG PO CR tablet Take 1 tablet (27 mg total) by mouth every morning. 30 tablet 0   Multiple Vitamin (MULITIVITAMIN WITH MINERALS) TABS Take 1 tablet by mouth daily.     oxyCODONE (ROXICODONE) 15 MG immediate release tablet Take 15 mg by mouth every 6 (six) hours as needed for pain.     pantoprazole (PROTONIX) 40 MG tablet Take 1 tablet (40 mg total) by mouth 2 (two) times daily. 180 tablet 1   SODIUM FLUORIDE, DENTAL RINSE, 0.2 % SOLN Take by mouth.     NARCAN 4 MG/0.1ML LIQD  nasal spray kit  (Patient not taking: Reported on 10/30/2021)     rizatriptan (MAXALT-MLT) 10 MG disintegrating tablet TAKE 1 TABLET BY MOUTH AS NEEDED FOR MIGRAINE. MAY REPEAT IN 2 HOURS IF NEEDED (Patient not taking: Reported on 05/07/2022) 10 tablet 0   No facility-administered medications prior to visit.    Allergies  Allergen Reactions   Vancomycin Itching and Anaphylaxis    Face/back warm and red.  Pt lips and tongue swelled, hives   Demerol Hives and Nausea And Vomiting   Demerol  [Meperidine Hcl] Other (See Comments)   Meperidine Nausea And Vomiting and Hives   Microplegia Msa-Msg  [Cardioplegia Del Nido Formula]     Other reaction(s): headache   Monosodium Glutamate Nausea And Vomiting and Other (See Comments)    migraines Other reaction(s): headache   Tape Rash    Plastic tape     PE;    05/07/2022    1:51 PM 01/27/2022    4:10 PM 12/04/2021    1:05 PM  Vitals with BMI  Height 5\' 3"  5\' 3"    Weight 266 lbs 250 lbs   BMI 47.13 44.3   Systolic 130 120 409  Diastolic 90 83 69  Pulse 64  71    Gen: Alert, well appearing.  Patient is oriented to person, place, time, and situation. AFFECT: pleasant, lucid thought and speech. No further exam today  Pertinent labs:  Lab Results  Component Value Date   TSH 1.86 02/02/2020   Lab Results  Component Value Date   WBC  7.0 11/01/2021   HGB 10.3 (L) 11/01/2021   HCT 33.0 (L) 11/01/2021   MCV 79.3 (L) 11/01/2021   PLT 224 11/01/2021   Lab Results  Component Value Date   IRON 172 11/01/2021   TIBC 400 11/01/2021   FERRITIN 19 11/01/2021   Lab Results  Component Value Date   CREATININE 0.54 07/22/2021   BUN 12 07/22/2021   NA 139 07/22/2021   K 4.1 07/22/2021   CL 104 07/22/2021   CO2 29 07/22/2021   Lab Results  Component Value Date   ALT 17 04/08/2021   AST 21 04/08/2021   ALKPHOS 53 04/08/2021   BILITOT 0.7 04/08/2021   Lab Results  Component Value Date   CHOL 189 02/02/2020   Lab Results  Component Value Date   HDL 53.70 02/02/2020   Lab Results  Component Value Date   LDLCALC 119 (H) 02/02/2020   Lab Results  Component Value Date   TRIG 80.0 02/02/2020   Lab Results  Component Value Date   CHOLHDL 4 02/02/2020    ASSESSMENT AND PLAN:   #1 adult ADD. Doing well on methylphenidate CR 27 mg a day. Controlled substance contract up-to-date. Will request her most recent drug screen from her pain management physician, Dr. Oneal Grout.  #2 GAD. Worse since abruptly stopping Cymbalta 60 mg 1 week ago when she ran out. Restart Cymbalta 30 mg daily.  3.  Hypertension, well-controlled on lisinopril 40 mg a day. No changes today.  (Her next visit will be for complete physical exam  Labs: will check a CBC, iron panel, CMP, TSH, and FLP. Vaccines: All UTD. Colon ca screening: 2022 colonoscopy showed no polyps but prep was incomplete.  Recall 5 years was recommended. Breast cancer screening: mammogram is needed. Cervical cancer screening: last pap 12/10/20 normal.  Rpt 3 yrs (hx of cerv dysplasia).  An After Visit Summary was printed and given to the patient.  FOLLOW UP:  No follow-ups on file.  Signed:  Santiago Bumpers, MD           05/07/2022

## 2022-05-14 DIAGNOSIS — Z79891 Long term (current) use of opiate analgesic: Secondary | ICD-10-CM | POA: Diagnosis not present

## 2022-05-14 DIAGNOSIS — M5106 Intervertebral disc disorders with myelopathy, lumbar region: Secondary | ICD-10-CM | POA: Diagnosis not present

## 2022-05-14 DIAGNOSIS — M791 Myalgia, unspecified site: Secondary | ICD-10-CM | POA: Diagnosis not present

## 2022-05-14 DIAGNOSIS — G894 Chronic pain syndrome: Secondary | ICD-10-CM | POA: Diagnosis not present

## 2022-05-14 DIAGNOSIS — M5136 Other intervertebral disc degeneration, lumbar region: Secondary | ICD-10-CM | POA: Diagnosis not present

## 2022-05-14 DIAGNOSIS — M4726 Other spondylosis with radiculopathy, lumbar region: Secondary | ICD-10-CM | POA: Diagnosis not present

## 2022-05-22 ENCOUNTER — Encounter: Payer: Self-pay | Admitting: Family Medicine

## 2022-06-06 ENCOUNTER — Encounter: Payer: Self-pay | Admitting: Family Medicine

## 2022-06-06 ENCOUNTER — Ambulatory Visit (INDEPENDENT_AMBULATORY_CARE_PROVIDER_SITE_OTHER): Payer: BC Managed Care – PPO | Admitting: Family Medicine

## 2022-06-06 VITALS — BP 137/80 | HR 67 | Wt 259.0 lb

## 2022-06-06 DIAGNOSIS — D509 Iron deficiency anemia, unspecified: Secondary | ICD-10-CM | POA: Diagnosis not present

## 2022-06-06 DIAGNOSIS — I10 Essential (primary) hypertension: Secondary | ICD-10-CM | POA: Diagnosis not present

## 2022-06-06 DIAGNOSIS — Z1159 Encounter for screening for other viral diseases: Secondary | ICD-10-CM

## 2022-06-06 DIAGNOSIS — Z0000 Encounter for general adult medical examination without abnormal findings: Secondary | ICD-10-CM

## 2022-06-06 LAB — LIPID PANEL
Cholesterol: 212 mg/dL — ABNORMAL HIGH (ref 0–200)
HDL: 58 mg/dL (ref >39.00)
LDL Cholesterol: 135 mg/dL — ABNORMAL HIGH (ref 0–99)
NonHDL: 154.42
Total CHOL/HDL Ratio: 4
Triglycerides: 96 mg/dL (ref 0.0–149.0)
VLDL: 19.2 mg/dL (ref 0.0–40.0)

## 2022-06-06 LAB — COMPREHENSIVE METABOLIC PANEL
ALT: 19 U/L (ref 0–35)
AST: 19 U/L (ref 0–37)
Albumin: 4 g/dL (ref 3.5–5.2)
Alkaline Phosphatase: 64 U/L (ref 39–117)
BUN: 9 mg/dL (ref 6–23)
CO2: 31 mEq/L (ref 19–32)
Calcium: 9.2 mg/dL (ref 8.4–10.5)
Chloride: 101 mEq/L (ref 96–112)
Creatinine, Ser: 0.67 mg/dL (ref 0.40–1.20)
GFR: 103.42 mL/min (ref >60.00)
Glucose, Bld: 87 mg/dL (ref 70–99)
Potassium: 3.8 mEq/L (ref 3.5–5.1)
Sodium: 140 mEq/L (ref 135–145)
Total Bilirubin: 0.6 mg/dL (ref 0.2–1.2)
Total Protein: 7.1 g/dL (ref 6.0–8.3)

## 2022-06-06 LAB — CBC
HCT: 40.9 % (ref 36.0–46.0)
Hemoglobin: 13.1 g/dL (ref 12.0–15.0)
MCHC: 32 g/dL (ref 30.0–36.0)
MCV: 86.3 fl (ref 78.0–100.0)
Platelets: 197 10*3/uL (ref 150.0–400.0)
RBC: 4.74 Mil/uL (ref 3.87–5.11)
RDW: 15.3 % (ref 11.5–15.5)
WBC: 6.6 10*3/uL (ref 4.0–10.5)

## 2022-06-06 LAB — TSH: TSH: 1.6 u[IU]/mL (ref 0.35–5.50)

## 2022-06-06 MED ORDER — PANTOPRAZOLE SODIUM 40 MG PO TBEC: 40.0000 mg | DELAYED_RELEASE_TABLET | Freq: Two times a day (BID) | ORAL | 1 refills | Status: DC

## 2022-06-06 MED ORDER — METHYLPHENIDATE HCL ER (OSM) 27 MG PO TBCR: 27.0000 mg | EXTENDED_RELEASE_TABLET | ORAL | 0 refills | Status: DC

## 2022-06-06 MED ORDER — LISINOPRIL 40 MG PO TABS: 40.0000 mg | ORAL_TABLET | Freq: Every day | ORAL | 1 refills | Status: DC

## 2022-06-06 NOTE — Progress Notes (Signed)
Office Note 06/06/2022  CC:  Chief Complaint  Patient presents with   Annual Exam    HPI:  Patient is a 47 y.o. female who is here for annual health maintenance exam. She continues to maximize dietary and exercise efforts in order to lose weight. She has made a step towards getting her Lap-Band removed and then transitioning to gastric sleeve.   Past Medical History:  Diagnosis Date   Allergic rhinitis    Arthritis    Carpal tunnel syndrome    right wrist, numb all the time   Cervical dysplasia    Chronic back pain    Degenerative lumbar spondylosis with grade 2 spondylolisthesis L4 on L5 with bilater pars defects L4.    COVID-19 virus infection 02/06/2020   DDD (degenerative disc disease), lumbar    MRI 10/2015: grade I (7mm) anterolisthesis L4 on L5, w/ disc herniation with encroachment on spinal nerves at L4-S1 levels.   Depression    GERD (gastroesophageal reflux disease)    worsened 07/2018 likely associated with lap band being too constricting->increased pantoprazole to 40mg  bid at that time.   History of chronic bronchitis    hx of when she was a smoker: ? mild intermittent asthma?--much better since stopped smoking 2018.   History of fracture of phalanx of finger    left, 4th finger distal phalanx   History of non anemic vitamin B12 deficiency    s/p lap band surgery per pt report.   Hyperlipidemia    Hypertension    off all bp meds x 3-4 years   Iron deficiency anemia 01/2019   hemoccults ordered but never turned in (01/2019-01/2020). Iron supp started 02/2020. Hemoccults neg 07/2020, plan for iron infusion. 08/2020 EGD with SB bx normal, no source of IDA seen on colonoscopy. Iron infusion 09/06/2020   LAP-BAND surgery status    Pt getting lap band removed and is getting gastric sleave after.   Migraine syndrome    Ovarian cyst 10/2015   LEFT, 4 cm--noted on L spine MRI w/out contrast.  F/u u/s imaging showed simple cyst.   Spondylarthrosis    Tobacco dependence in  remission    quit 2018    Past Surgical History:  Procedure Laterality Date   BACK SURGERY  2019   CARPAL TUNNEL RELEASE Left    CERVICAL CONE BIOPSY     COLONOSCOPY     2015 normal.  08/2020 (for IDA)->incomplete prep, no adenomas->rpt 5 yrs d/t incomplete prep.   ESOPHAGOGASTRODUODENOSCOPY N/A 04/27/2014   Procedure: ESOPHAGOGASTRODUODENOSCOPY (EGD);  Surgeon: Ovidio Kin, MD;  Location: [facility];  Service: General;  Laterality: N/A;   ESOPHAGOGASTRODUODENOSCOPY  08/29/2020   6 cm hiatal hernia noted, o/w normal, SB bx neg.  ?hernia contributing to pt's IDA?   GASTRIC BANDING PORT REVISION N/A 10/02/2014   Procedure: LAPROSCOPIC PLACEMENT OF LAP BAND PORT;  Surgeon: Glenna Fellows, MD;  Location: [facility];  Service: General;  Laterality: N/A;   LAPAROSCOPIC GASTRIC BANDING  03/2007   [facility]; Dr Jorja Loa Hipp   LAPAROSCOPIC REVISION OF GASTRIC BAND N/A 05/17/2012   Procedure: LAPAROSCOPIC REVISION OF SLIPPED GASTRIC BAND;  Surgeon: Mariella Saa, MD;  Location: [facility];  Service: General;  Laterality: N/A;   LAPAROSCOPY N/A 04/30/2014   Procedure: DIAGNOSTIC LAPAROSCOPY WITH REMOVAL OF INFECTED LAP BAND PORT WITH DEBRIDEMENT SUBCUTANEOUS ABSCESS;  Surgeon: Gaynelle Adu, MD;  Location: [facility];  Service: General;  Laterality: N/A;   VARICOSE VEIN SURGERY Bilateral 2009  Family History  Problem Relation Age of Onset   Alcohol abuse Mother    Drug abuse Mother    Arthritis Mother    Hyperlipidemia Mother    Heart disease Mother    Hypertension Mother    Non-Hodgkin's lymphoma Mother    Lung cancer Mother    Alcohol abuse Father    Arthritis Father    Hyperlipidemia Father    Heart disease Father    Stroke Father    Hypertension Father    Mental illness Father    Diabetes Father    Hyperlipidemia Sister    Hypertension Sister    Arthritis Maternal Grandmother    Cancer Maternal Grandmother    Hypertension Maternal Grandmother     Arthritis Maternal Grandfather    Arthritis Paternal Grandmother    Cancer Paternal Grandmother    Hyperlipidemia Paternal Grandmother    Heart disease Paternal Grandmother    Hypertension Paternal Grandmother    Diabetes Paternal Grandmother    Arthritis Paternal Grandfather    Hyperlipidemia Paternal Grandfather    Heart disease Paternal Grandfather    Stroke Paternal Grandfather    Hypertension Paternal Grandfather     Social History   Socioeconomic History   Marital status: Married    Spouse name: Not on file   Number of children: Not on file   Years of education: Not on file   Highest education level: Not on file  Occupational History   Not on file  Tobacco Use   Smoking status: Former    Packs/day: 0.25    Years: 5.00    Additional pack years: 0.00    Total pack years: 1.25    Types: Cigarettes   Smokeless tobacco: Never  Substance and Sexual Activity   Alcohol use: No   Drug use: No   Sexual activity: Yes    Birth control/protection: None  Other Topics Concern   Not on file  Social History Narrative   Single, no children.   Occup: Retail banker   Tobacco: 6 pack-yr hx--quit with wellbutrin.   No alcohol or drugs.   Social Determinants of Health   Financial Resource Strain: Not on file  Food Insecurity: No Food Insecurity (01/09/2022)   Hunger Vital Sign    Worried About Running Out of Food in the Last Year: Never true    Ran Out of Food in the Last Year: Never true  Transportation Needs: No Transportation Needs (01/09/2022)   PRAPARE - Administrator, Civil Service (Medical): No    Lack of Transportation (Non-Medical): No  Physical Activity: Not on file  Stress: Not on file  Social Connections: Not on file  Intimate Partner Violence: Not on file    Outpatient Medications Prior to Visit  Medication Sig Dispense Refill   albuterol (VENTOLIN HFA) 108 (90 Base) MCG/ACT inhaler INHALE 2 PUFFS INTO THE LUNGS EVERY 4 (FOUR) HOURS AS NEEDED  FOR WHEEZING OR SHORTNESS OF BREATH. 8 g 0   baclofen (LIORESAL) 20 MG tablet Take 10-20 mg by mouth every 8 (eight) hours.     cetirizine (ZYRTEC) 10 MG tablet Take 20 mg by mouth daily as needed for allergies (allergies).     diclofenac (VOLTAREN) 75 MG EC tablet Take 1 tablet by mouth 2 (two) times daily.      DULoxetine (CYMBALTA) 30 MG capsule Take 1 capsule (30 mg total) by mouth daily. 90 capsule 1   gabapentin (NEURONTIN) 800 MG tablet Take 800 mg by mouth in the  morning, at noon, in the evening, and at bedtime.  6   Multiple Vitamin (MULITIVITAMIN WITH MINERALS) TABS Take 1 tablet by mouth daily.     oxyCODONE (ROXICODONE) 15 MG immediate release tablet Take 15 mg by mouth every 6 (six) hours as needed for pain.     oxyCODONE (ROXICODONE) 15 MG immediate release tablet Take 15 mg by mouth every 6 (six) hours. Do not fill until 06/24/2022 for 30 days.     Oxycodone HCl 10 MG TABS Take 10 mg by mouth every 6 (six) hours. Do not fill until 05/26/2022 for 30 days.     rizatriptan (MAXALT-MLT) 10 MG disintegrating tablet TAKE 1 TABLET BY MOUTH AS NEEDED FOR MIGRAINE. MAY REPEAT IN 2 HOURS IF NEEDED 10 tablet 0   SODIUM FLUORIDE, DENTAL RINSE, 0.2 % SOLN Take by mouth.     NARCAN 4 MG/0.1ML LIQD nasal spray kit  (Patient not taking: Reported on 10/30/2021)     lisinopril (ZESTRIL) 40 MG tablet Take 1 tablet (40 mg total) by mouth daily. 90 tablet 1   methylphenidate 27 MG PO CR tablet Take 1 tablet (27 mg total) by mouth every morning. 30 tablet 0   pantoprazole (PROTONIX) 40 MG tablet Take 1 tablet (40 mg total) by mouth 2 (two) times daily. 180 tablet 1   No facility-administered medications prior to visit.    Allergies  Allergen Reactions   Vancomycin Itching and Anaphylaxis    Face/back warm and red.  Pt lips and tongue swelled, hives   Demerol Hives and Nausea And Vomiting   Demerol  [Meperidine Hcl] Other (See Comments)   Meperidine Nausea And Vomiting and Hives   Microplegia  Msa-Msg  [Cardioplegia Del Nido Formula]     Other reaction(s): headache   Monosodium Glutamate Nausea And Vomiting and Other (See Comments)    migraines Other reaction(s): headache   Tape Rash    Plastic tape    Review of Systems  Constitutional:  Negative for appetite change, chills, fatigue and fever.  HENT:  Negative for congestion, dental problem, ear pain and sore throat.   Eyes:  Negative for discharge, redness and visual disturbance.  Respiratory:  Negative for cough, chest tightness, shortness of breath and wheezing.   Cardiovascular:  Negative for chest pain and palpitations.  Gastrointestinal:  Negative for abdominal pain, blood in stool, diarrhea, nausea and vomiting.  Genitourinary:  Negative for difficulty urinating, dysuria, flank pain, frequency, hematuria and urgency.  Musculoskeletal:  Negative for arthralgias, back pain, joint swelling, myalgias and neck stiffness.  Skin:  Negative for pallor and rash.  Neurological:  Negative for dizziness, speech difficulty, weakness and headaches.  Hematological:  Negative for adenopathy. Does not bruise/bleed easily.  Psychiatric/Behavioral:  Negative for confusion and sleep disturbance. The patient is not nervous/anxious.     PE;    06/06/2022    1:10 PM 05/07/2022    1:51 PM 01/27/2022    4:10 PM  Vitals with BMI  Height  5\' 3"  5\' 3"   Weight 259 lbs 266 lbs 250 lbs  BMI 45.89 47.13 44.3  Systolic 137 130 782  Diastolic 80 90 83  Pulse 67 64    Exam chaperoned by Sammuel Cooper, CMA  Gen: Alert, well appearing.  Patient is oriented to person, place, time, and situation. AFFECT: pleasant, lucid thought and speech. ENT: Ears: EACs clear, normal epithelium.  TMs with good light reflex and landmarks bilaterally.  Eyes: no injection, icteris, swelling, or exudate.  EOMI, PERRLA. Nose:  no drainage or turbinate edema/swelling.  No injection or focal lesion.  Mouth: lips without lesion/swelling.  Oral mucosa pink and moist.   Dentition intact and without obvious caries or gingival swelling.  Oropharynx without erythema, exudate, or swelling.  Neck: supple/nontender.  No LAD, mass, or TM.  Carotid pulses 2+ bilaterally, without bruits. CV: RRR, no m/r/g.   LUNGS: CTA bilat, nonlabored resps, good aeration in all lung fields. ABD: soft, NT, ND, BS normal.  No hepatospenomegaly or mass.  No bruits. EXT: no clubbing or cyanosis.  She has 1+ right lower leg pitting edema and 2+ left lower leg pitting edema.  This is palpable through compression stockings. Musculoskeletal: no joint swelling, erythema, warmth, or tenderness.  ROM of all joints intact. Skin - no sores or suspicious lesions or rashes or color changes  Pertinent labs:  Lab Results  Component Value Date   TSH 1.86 02/02/2020   Lab Results  Component Value Date   WBC 7.0 11/01/2021   HGB 10.3 (L) 11/01/2021   HCT 33.0 (L) 11/01/2021   MCV 79.3 (L) 11/01/2021   PLT 224 11/01/2021   Lab Results  Component Value Date   IRON 172 11/01/2021   TIBC 400 11/01/2021   FERRITIN 19 11/01/2021    Lab Results  Component Value Date   CREATININE 0.54 07/22/2021   BUN 12 07/22/2021   NA 139 07/22/2021   K 4.1 07/22/2021   CL 104 07/22/2021   CO2 29 07/22/2021   Lab Results  Component Value Date   ALT 17 04/08/2021   AST 21 04/08/2021   ALKPHOS 53 04/08/2021   BILITOT 0.7 04/08/2021   Lab Results  Component Value Date   CHOL 189 02/02/2020   Lab Results  Component Value Date   HDL 53.70 02/02/2020   Lab Results  Component Value Date   LDLCALC 119 (H) 02/02/2020   Lab Results  Component Value Date   TRIG 80.0 02/02/2020   Lab Results  Component Value Date   CHOLHDL 4 02/02/2020   ASSESSMENT AND PLAN:   #1 health maintenance exam: Reviewed age and gender appropriate health maintenance issues (prudent diet, regular exercise, health risks of tobacco and excessive alcohol, use of seatbelts, fire alarms in home, use of sunscreen).  Also  reviewed age and gender appropriate health screening as well as vaccine recommendations. Labs: will check a CBC, iron panel, CMP, TSH, and FLP. Vaccines: All UTD. Colon ca screening: 2022 colonoscopy showed no polyps but prep was incomplete.  Recall 5 years was recommended. Breast cancer screening: mammogram is needed->scheduled for 07/03/22. Cervical cancer screening: last pap 12/10/20 normal.  Rpt 3 yrs (hx of cerv dysplasia).  #2 obesity, BMI is 45. She is continuing to significantly limit calories and exercise is much as possible. She will be getting her gastric band removed in the near future and then we will get a gastric sleeve procedure.  An After Visit Summary was printed and given to the patient.  FOLLOW UP:  Return in about 3 months (around 09/06/2022) for routine chronic illness f/u.  Signed:  Santiago Bumpers, MD           06/06/2022

## 2022-06-07 LAB — IRON,TIBC AND FERRITIN PANEL
%SAT: 15 % (calc) — ABNORMAL LOW (ref 16–45)
Ferritin: 21 ng/mL (ref 16–232)
Iron: 49 ug/dL (ref 40–190)
TIBC: 333 mcg/dL (calc) (ref 250–450)

## 2022-06-07 LAB — HEPATITIS C ANTIBODY: Hepatitis C Ab: NONREACTIVE

## 2022-07-21 DIAGNOSIS — M5136 Other intervertebral disc degeneration, lumbar region: Secondary | ICD-10-CM | POA: Diagnosis not present

## 2022-07-21 DIAGNOSIS — M961 Postlaminectomy syndrome, not elsewhere classified: Secondary | ICD-10-CM | POA: Diagnosis not present

## 2022-07-21 DIAGNOSIS — M5106 Intervertebral disc disorders with myelopathy, lumbar region: Secondary | ICD-10-CM | POA: Diagnosis not present

## 2022-07-21 DIAGNOSIS — Z79891 Long term (current) use of opiate analgesic: Secondary | ICD-10-CM | POA: Diagnosis not present

## 2022-07-21 DIAGNOSIS — M791 Myalgia, unspecified site: Secondary | ICD-10-CM | POA: Diagnosis not present

## 2022-07-21 DIAGNOSIS — G894 Chronic pain syndrome: Secondary | ICD-10-CM | POA: Diagnosis not present

## 2022-07-22 DIAGNOSIS — M170 Bilateral primary osteoarthritis of knee: Secondary | ICD-10-CM | POA: Diagnosis not present

## 2022-07-28 ENCOUNTER — Ambulatory Visit: Payer: BC Managed Care – PPO | Admitting: Family Medicine

## 2022-07-28 ENCOUNTER — Encounter: Payer: Self-pay | Admitting: Family Medicine

## 2022-07-28 NOTE — Progress Notes (Deleted)
OFFICE VISIT  07/28/2022  CC: No chief complaint on file.   Patient is a 47 y.o. female who presents for "discuss lap band"  HPI: ***  Past Medical History:  Diagnosis Date   Allergic rhinitis    Arthritis    Carpal tunnel syndrome    right wrist, numb all the time   Cervical dysplasia    Chronic back pain    Degenerative lumbar spondylosis with grade 2 spondylolisthesis L4 on L5 with bilater pars defects L4.    COVID-19 virus infection 02/06/2020   DDD (degenerative disc disease), lumbar    MRI 10/2015: grade I (7mm) anterolisthesis L4 on L5, w/ disc herniation with encroachment on spinal nerves at L4-S1 levels.   Depression    Edema of both lower extremities due to peripheral venous insufficiency    +varicose veins (hx of vein stripping 2009)   GERD (gastroesophageal reflux disease)    worsened 07/2018 likely associated with lap band being too constricting->increased pantoprazole to 40mg  bid at that time.   History of chronic bronchitis    hx of when she was a smoker: ? mild intermittent asthma?--much better since stopped smoking 2018.   History of fracture of phalanx of finger    left, 4th finger distal phalanx   History of non anemic vitamin B12 deficiency    s/p lap band surgery per pt report.   Hyperlipidemia    Hypertension    off all bp meds x 3-4 years   Iron deficiency anemia 01/2019   hemoccults ordered but never turned in (01/2019-01/2020). Iron supp started 02/2020. Hemoccults neg 07/2020, plan for iron infusion. 08/2020 EGD with SB bx normal, no source of IDA seen on colonoscopy. Iron infusion 09/06/2020   LAP-BAND surgery status    Pt getting lap band removed and is getting gastric sleave after.   Migraine syndrome    Ovarian cyst 10/2015   LEFT, 4 cm--noted on L spine MRI w/out contrast.  F/u u/s imaging showed simple cyst.   Spondylarthrosis    Tobacco dependence in remission    quit 2018    Past Surgical History:  Procedure Laterality Date   BACK SURGERY   2019   CARPAL TUNNEL RELEASE Left    CERVICAL CONE BIOPSY     COLONOSCOPY     2015 normal.  08/2020 (for IDA)->incomplete prep, no adenomas->rpt 5 yrs d/t incomplete prep.   ESOPHAGOGASTRODUODENOSCOPY N/A 04/27/2014   Procedure: ESOPHAGOGASTRODUODENOSCOPY (EGD);  Surgeon: Ovidio Kin, MD;  Location: [facility];  Service: General;  Laterality: N/A;   ESOPHAGOGASTRODUODENOSCOPY  08/29/2020   6 cm hiatal hernia noted, o/w normal, SB bx neg.  ?hernia contributing to pt's IDA?   GASTRIC BANDING PORT REVISION N/A 10/02/2014   Procedure: LAPROSCOPIC PLACEMENT OF LAP BAND PORT;  Surgeon: Glenna Fellows, MD;  Location: [facility];  Service: General;  Laterality: N/A;   LAPAROSCOPIC GASTRIC BANDING  03/2007   [facility] - [facility]; Dr Jorja Loa Hipp   LAPAROSCOPIC REVISION OF GASTRIC BAND N/A 05/17/2012   Procedure: LAPAROSCOPIC REVISION OF SLIPPED GASTRIC BAND;  Surgeon: Mariella Saa, MD;  Location: [facility];  Service: General;  Laterality: N/A;   LAPAROSCOPY N/A 04/30/2014   Procedure: DIAGNOSTIC LAPAROSCOPY WITH REMOVAL OF INFECTED LAP BAND PORT WITH DEBRIDEMENT SUBCUTANEOUS ABSCESS;  Surgeon: Gaynelle Adu, MD;  Location: [facility];  Service: General;  Laterality: N/A;   VARICOSE VEIN SURGERY Bilateral 2009    Outpatient Medications Prior to Visit  Medication Sig Dispense Refill   albuterol (  VENTOLIN HFA) 108 (90 Base) MCG/ACT inhaler INHALE 2 PUFFS INTO THE LUNGS EVERY 4 (FOUR) HOURS AS NEEDED FOR WHEEZING OR SHORTNESS OF BREATH. 8 g 0   baclofen (LIORESAL) 20 MG tablet Take 10-20 mg by mouth every 8 (eight) hours.     cetirizine (ZYRTEC) 10 MG tablet Take 20 mg by mouth daily as needed for allergies (allergies).     diclofenac (VOLTAREN) 75 MG EC tablet Take 1 tablet by mouth 2 (two) times daily.      DULoxetine (CYMBALTA) 30 MG capsule Take 1 capsule (30 mg total) by mouth daily. 90 capsule 1   gabapentin (NEURONTIN) 800 MG tablet Take 800 mg by mouth in the morning, at noon, in  the evening, and at bedtime.  6   lisinopril (ZESTRIL) 40 MG tablet Take 1 tablet (40 mg total) by mouth daily. 90 tablet 1   methylphenidate 27 MG PO CR tablet Take 1 tablet (27 mg total) by mouth every morning. 30 tablet 0   Multiple Vitamin (MULITIVITAMIN WITH MINERALS) TABS Take 1 tablet by mouth daily.     NARCAN 4 MG/0.1ML LIQD nasal spray kit  (Patient not taking: Reported on 10/30/2021)     oxyCODONE (ROXICODONE) 15 MG immediate release tablet Take 15 mg by mouth every 6 (six) hours as needed for pain.     oxyCODONE (ROXICODONE) 15 MG immediate release tablet Take 15 mg by mouth every 6 (six) hours. Do not fill until 06/24/2022 for 30 days.     Oxycodone HCl 10 MG TABS Take 10 mg by mouth every 6 (six) hours. Do not fill until 05/26/2022 for 30 days.     pantoprazole (PROTONIX) 40 MG tablet Take 1 tablet (40 mg total) by mouth 2 (two) times daily. 180 tablet 1   rizatriptan (MAXALT-MLT) 10 MG disintegrating tablet TAKE 1 TABLET BY MOUTH AS NEEDED FOR MIGRAINE. MAY REPEAT IN 2 HOURS IF NEEDED 10 tablet 0   SODIUM FLUORIDE, DENTAL RINSE, 0.2 % SOLN Take by mouth.     No facility-administered medications prior to visit.    Allergies  Allergen Reactions   Vancomycin Itching and Anaphylaxis    Face/back warm and red.  Pt lips and tongue swelled, hives   Demerol Hives and Nausea And Vomiting   Demerol  [Meperidine Hcl] Other (See Comments)   Meperidine Nausea And Vomiting and Hives   Microplegia Msa-Msg  [Cardioplegia Del Nido Formula]     Other reaction(s): headache   Monosodium Glutamate Nausea And Vomiting and Other (See Comments)    migraines Other reaction(s): headache   Tape Rash    Plastic tape    Review of Systems  As per HPI  PE:    06/06/2022    1:10 PM 05/07/2022    1:51 PM 01/27/2022    4:10 PM  Vitals with BMI  Height  5\' 3"  5\' 3"   Weight 259 lbs 266 lbs 250 lbs  BMI 45.89 47.13 44.3  Systolic 137 130 161  Diastolic 80 90 83  Pulse 67 64      Physical  Exam  ***  LABS:  Last CBC Lab Results  Component Value Date   WBC 6.6 06/06/2022   HGB 13.1 06/06/2022   HCT 40.9 06/06/2022   MCV 86.3 06/06/2022   MCH 24.8 (L) 11/01/2021   RDW 15.3 06/06/2022   PLT 197.0 06/06/2022   Last metabolic panel Lab Results  Component Value Date   GLUCOSE 87 06/06/2022   NA 140 06/06/2022  K 3.8 06/06/2022   CL 101 06/06/2022   CO2 31 06/06/2022   BUN 9 06/06/2022   CREATININE 0.67 06/06/2022   GFR 103.42 06/06/2022   CALCIUM 9.2 06/06/2022   PROT 7.1 06/06/2022   ALBUMIN 4.0 06/06/2022   BILITOT 0.6 06/06/2022   ALKPHOS 64 06/06/2022   AST 19 06/06/2022   ALT 19 06/06/2022   ANIONGAP 8 04/08/2021    IMPRESSION AND PLAN:  No problem-specific Assessment & Plan notes found for this encounter.   An After Visit Summary was printed and given to the patient.  FOLLOW UP: No follow-ups on file.  Signed:  Santiago Bumpers, MD           07/28/2022

## 2022-08-04 ENCOUNTER — Telehealth: Payer: Self-pay

## 2022-08-04 NOTE — Telephone Encounter (Signed)
[facility] Day - Client Nonclinical Telephone Record  [facility] Client [facility] Day - Client Client Site [facility] - Day Provider Santiago Bumpers - MD Contact Type Call Who Is Calling Patient / Member / Family / Caregiver Caller Name [name] Caller Phone Number [phone] Patient Name Samantha Doyle Patient DOB 1975-06-15 Call Type Message Only Information Provided Reason for Call Request to Cancel Office Appointment Initial Comment Caller states she has an appt at 1245 today and she cannot make it. Disp. Time Disposition Final User 07/28/2022 12:45:26 PM General Information Provided Yes Phillips Hay Call Closed By: Phillips Hay Transaction Date/Time: 07/28/2022 12:41:52 PM (ET

## 2022-08-18 DIAGNOSIS — S90425A Blister (nonthermal), left lesser toe(s), initial encounter: Secondary | ICD-10-CM | POA: Diagnosis not present

## 2022-08-18 DIAGNOSIS — Z7689 Persons encountering health services in other specified circumstances: Secondary | ICD-10-CM | POA: Diagnosis not present

## 2022-08-21 ENCOUNTER — Encounter (INDEPENDENT_AMBULATORY_CARE_PROVIDER_SITE_OTHER): Payer: Self-pay

## 2022-08-21 DIAGNOSIS — L02612 Cutaneous abscess of left foot: Secondary | ICD-10-CM | POA: Diagnosis not present

## 2022-08-21 DIAGNOSIS — S90822A Blister (nonthermal), left foot, initial encounter: Secondary | ICD-10-CM | POA: Diagnosis not present

## 2022-08-21 DIAGNOSIS — L089 Local infection of the skin and subcutaneous tissue, unspecified: Secondary | ICD-10-CM | POA: Diagnosis not present

## 2022-09-10 DIAGNOSIS — Z79891 Long term (current) use of opiate analgesic: Secondary | ICD-10-CM | POA: Diagnosis not present

## 2022-09-10 DIAGNOSIS — M4726 Other spondylosis with radiculopathy, lumbar region: Secondary | ICD-10-CM | POA: Diagnosis not present

## 2022-09-10 DIAGNOSIS — M5136 Other intervertebral disc degeneration, lumbar region: Secondary | ICD-10-CM | POA: Diagnosis not present

## 2022-09-10 DIAGNOSIS — M5106 Intervertebral disc disorders with myelopathy, lumbar region: Secondary | ICD-10-CM | POA: Diagnosis not present

## 2022-09-10 DIAGNOSIS — G894 Chronic pain syndrome: Secondary | ICD-10-CM | POA: Diagnosis not present

## 2022-09-10 DIAGNOSIS — M791 Myalgia, unspecified site: Secondary | ICD-10-CM | POA: Diagnosis not present

## 2022-09-11 ENCOUNTER — Ambulatory Visit (INDEPENDENT_AMBULATORY_CARE_PROVIDER_SITE_OTHER): Payer: BC Managed Care – PPO | Admitting: Family Medicine

## 2022-09-11 ENCOUNTER — Encounter: Payer: Self-pay | Admitting: Family Medicine

## 2022-09-11 ENCOUNTER — Telehealth: Payer: Self-pay | Admitting: Family Medicine

## 2022-09-11 VITALS — BP 112/75 | HR 78 | Temp 98.2°F | Ht 63.0 in | Wt 263.8 lb

## 2022-09-11 DIAGNOSIS — R600 Localized edema: Secondary | ICD-10-CM

## 2022-09-11 DIAGNOSIS — F411 Generalized anxiety disorder: Secondary | ICD-10-CM | POA: Diagnosis not present

## 2022-09-11 DIAGNOSIS — F988 Other specified behavioral and emotional disorders with onset usually occurring in childhood and adolescence: Secondary | ICD-10-CM | POA: Diagnosis not present

## 2022-09-11 DIAGNOSIS — Z23 Encounter for immunization: Secondary | ICD-10-CM

## 2022-09-11 DIAGNOSIS — I872 Venous insufficiency (chronic) (peripheral): Secondary | ICD-10-CM

## 2022-09-11 DIAGNOSIS — M7989 Other specified soft tissue disorders: Secondary | ICD-10-CM | POA: Diagnosis not present

## 2022-09-11 MED ORDER — METHYLPHENIDATE HCL ER (OSM) 27 MG PO TBCR: 27.0000 mg | EXTENDED_RELEASE_TABLET | ORAL | 0 refills | Status: DC

## 2022-09-11 NOTE — Progress Notes (Signed)
OFFICE VISIT  09/11/2022  CC:  Chief Complaint  Patient presents with   Medical Management of Chronic Issues    Pt brought forms for gastric band to be completed; pt also has concern about right leg swelling in the last month, has been wearing compression socks.     Patient is a 47 y.o. female who presents for 79-month follow-up hypertension, adult ADD, and GAD. A/P as of last visit: "#1 health maintenance exam: Reviewed age and gender appropriate health maintenance issues (prudent diet, regular exercise, health risks of tobacco and excessive alcohol, use of seatbelts, fire alarms in home, use of sunscreen).  Also reviewed age and gender appropriate health screening as well as vaccine recommendations. Labs: will check a CBC, iron panel, CMP, TSH, and FLP. Vaccines: All UTD. Colon ca screening: 2022 colonoscopy showed no polyps but prep was incomplete.  Recall 5 years was recommended. Breast cancer screening: mammogram is needed->scheduled for 07/03/22. Cervical cancer screening: last pap 12/10/20 normal.  Rpt 3 yrs (hx of cerv dysplasia).   #2 obesity, BMI is 45. She is continuing to significantly limit calories and exercise is much as possible. She will be getting her gastric band removed in the near future and then we will get a gastric sleeve procedure."  INTERIM HX: Overall doing well but feels like her right lower leg swelling is worse for about the last month.  It does go down at night, reaccumulates by the end of the day.  She has had bilateral lower extremity swelling chronically and in the remote past had vein stripping on both legs. She has had no pain in her leg but it feels tight at the end of the day. She has not had any recent surgical procedure or prolonged immobility. She says that over 20 years ago she thinks she had a superficial thrombophlebitis of the right leg in the setting of surgery but she cannot be sure.  She needs some paperwork filled out to proceed with removal  of her lap band and get a gastric sleeve procedure done.  She had some trouble refilling her methylphenidate a couple of months ago and decided to just not bother with it. She has noticed significant return of symptoms, including poor attention and focus, increased frustration and irritability, poor planning and organization.  PMP AWARE reviewed today: Oxycodone and gabapentin prescribed by her pain management physician Dr. Oneal Grout. Most recent methylphenidate 27 mg tab prescribed 05/22/2022, #30, prescription by me. No red flags.  ROS as above, plus--> she has chronic bilateral knee pain due to osteoarthritis and has some chronic low back pain.   no fevers, no CP, no SOB, no wheezing, no cough, no dizziness, no HAs, no rashes, no melena/hematochezia.  No polyuria or polydipsia.  No myalgias.   No focal weakness, paresthesias, or tremors.  No acute vision or hearing abnormalities.  No dysuria or unusual/new urinary urgency or frequency.   No n/v/d or abd pain.  No palpitations.     Past Medical History:  Diagnosis Date   Allergic rhinitis    Bilateral primary osteoarthritis of knee    severe   Carpal tunnel syndrome    right wrist, numb all the time   Cervical dysplasia    Chronic back pain    Degenerative lumbar spondylosis with grade 2 spondylolisthesis L4 on L5 with bilater pars defects L4.    COVID-19 virus infection 02/06/2020   DDD (degenerative disc disease), lumbar    MRI 10/2015: grade I (7mm) anterolisthesis L4 on  L5, w/ disc herniation with encroachment on spinal nerves at L4-S1 levels.   Depression    Edema of both lower extremities due to peripheral venous insufficiency    +varicose veins (hx of vein stripping 2009)   GERD (gastroesophageal reflux disease)    worsened 07/2018 likely associated with lap band being too constricting->increased pantoprazole to 40mg  bid at that time.   History of chronic bronchitis    hx of when she was a smoker: ? mild intermittent asthma?--much  better since stopped smoking 2018.   History of fracture of phalanx of finger    left, 4th finger distal phalanx   History of non anemic vitamin B12 deficiency    s/p lap band surgery per pt report.   Hyperlipidemia    Hypertension    off all bp meds x 3-4 years   Iron deficiency anemia 01/2019   hemoccults ordered but never turned in (01/2019-01/2020). Iron supp started 02/2020. Hemoccults neg 07/2020, plan for iron infusion. 08/2020 EGD with SB bx normal, no source of IDA seen on colonoscopy. Iron infusion 09/06/2020   LAP-BAND surgery status    Pt getting lap band removed and is getting gastric sleave after.   Migraine syndrome    Ovarian cyst 10/2015   LEFT, 4 cm--noted on L spine MRI w/out contrast.  F/u u/s imaging showed simple cyst.   Spondylarthrosis    Tobacco dependence in remission    quit 2018    Past Surgical History:  Procedure Laterality Date   BACK SURGERY  2019   CARPAL TUNNEL RELEASE Left    CERVICAL CONE BIOPSY     COLONOSCOPY     2015 normal.  08/2020 (for IDA)->incomplete prep, no adenomas->rpt 5 yrs d/t incomplete prep.   ESOPHAGOGASTRODUODENOSCOPY N/A 04/27/2014   Procedure: ESOPHAGOGASTRODUODENOSCOPY (EGD);  Surgeon: Ovidio Kin, MD;  Location: [facility];  Service: General;  Laterality: N/A;   ESOPHAGOGASTRODUODENOSCOPY  08/29/2020   6 cm hiatal hernia noted, o/w normal, SB bx neg.  ?hernia contributing to pt's IDA?   GASTRIC BANDING PORT REVISION N/A 10/02/2014   Procedure: LAPROSCOPIC PLACEMENT OF LAP BAND PORT;  Surgeon: Glenna Fellows, MD;  Location: [facility];  Service: General;  Laterality: N/A;   LAPAROSCOPIC GASTRIC BANDING  03/2007   [facility] - [facility]; Dr Jorja Loa Hipp   LAPAROSCOPIC REVISION OF GASTRIC BAND N/A 05/17/2012   Procedure: LAPAROSCOPIC REVISION OF SLIPPED GASTRIC BAND;  Surgeon: Mariella Saa, MD;  Location: [facility];  Service: General;  Laterality: N/A;   LAPAROSCOPY N/A 04/30/2014   Procedure: DIAGNOSTIC LAPAROSCOPY  WITH REMOVAL OF INFECTED LAP BAND PORT WITH DEBRIDEMENT SUBCUTANEOUS ABSCESS;  Surgeon: Gaynelle Adu, MD;  Location: [facility];  Service: General;  Laterality: N/A;   VARICOSE VEIN SURGERY Bilateral 2009    Outpatient Medications Prior to Visit  Medication Sig Dispense Refill   baclofen (LIORESAL) 20 MG tablet Take 10-20 mg by mouth every 8 (eight) hours.     cetirizine (ZYRTEC) 10 MG tablet Take 20 mg by mouth daily as needed for allergies (allergies).     diclofenac (VOLTAREN) 75 MG EC tablet Take 1 tablet by mouth 2 (two) times daily.      DULoxetine (CYMBALTA) 30 MG capsule Take 1 capsule (30 mg total) by mouth daily. 90 capsule 1   gabapentin (NEURONTIN) 800 MG tablet Take 800 mg by mouth in the morning, at noon, in the evening, and at bedtime.  6   lisinopril (ZESTRIL) 40 MG tablet Take 1 tablet (40  mg total) by mouth daily. 90 tablet 1   Multiple Vitamin (MULITIVITAMIN WITH MINERALS) TABS Take 1 tablet by mouth daily.     NARCAN 4 MG/0.1ML LIQD nasal spray kit      oxyCODONE (ROXICODONE) 15 MG immediate release tablet Take 15 mg by mouth every 6 (six) hours as needed for pain.     oxyCODONE (ROXICODONE) 15 MG immediate release tablet Take 15 mg by mouth every 6 (six) hours. Do not fill until 06/24/2022 for 30 days.     Oxycodone HCl 10 MG TABS Take 10 mg by mouth every 6 (six) hours. Do not fill until 05/26/2022 for 30 days.     pantoprazole (PROTONIX) 40 MG tablet Take 1 tablet (40 mg total) by mouth 2 (two) times daily. 180 tablet 1   rizatriptan (MAXALT-MLT) 10 MG disintegrating tablet TAKE 1 TABLET BY MOUTH AS NEEDED FOR MIGRAINE. MAY REPEAT IN 2 HOURS IF NEEDED 10 tablet 0   SODIUM FLUORIDE, DENTAL RINSE, 0.2 % SOLN Take by mouth.     albuterol (VENTOLIN HFA) 108 (90 Base) MCG/ACT inhaler INHALE 2 PUFFS INTO THE LUNGS EVERY 4 (FOUR) HOURS AS NEEDED FOR WHEEZING OR SHORTNESS OF BREATH. (Patient not taking: Reported on 09/11/2022) 8 g 0   methylphenidate 27 MG PO CR tablet Take 1 tablet (27  mg total) by mouth every morning. (Patient not taking: Reported on 09/11/2022) 30 tablet 0   No facility-administered medications prior to visit.    Allergies  Allergen Reactions   Vancomycin Itching and Anaphylaxis    Face/back warm and red.  Pt lips and tongue swelled, hives   Demerol Hives and Nausea And Vomiting   Demerol  [Meperidine Hcl] Other (See Comments)   Meperidine Nausea And Vomiting and Hives   Microplegia Msa-Msg  [Cardioplegia Del Nido Formula]     Other reaction(s): headache   Monosodium Glutamate Nausea And Vomiting and Other (See Comments)    migraines Other reaction(s): headache   Tape Rash    Plastic tape    Review of Systems As per HPI  PE:    09/11/2022    1:01 PM 06/06/2022    1:10 PM 05/07/2022    1:51 PM  Vitals with BMI  Height 5\' 3"   5\' 3"   Weight 263 lbs 13 oz 259 lbs 266 lbs  BMI 46.74 45.89 47.13  Systolic 112 137 161  Diastolic 75 80 90  Pulse 78 67 64     Physical Exam  Gen: Alert, well appearing.  Patient is oriented to person, place, time, and situation. AFFECT: pleasant, lucid thought and speech. LEGS: no pitting edema but generous amount of nonpitting edema.  She has diffuse noninflamed varicosities bilaterally.  No erythema, no tenderness.  No palpable nodularity or induration.  Right leg not appreciably larger than left.  LABS:  Last CBC Lab Results  Component Value Date   WBC 6.6 06/06/2022   HGB 13.1 06/06/2022   HCT 40.9 06/06/2022   MCV 86.3 06/06/2022   MCH 24.8 (L) 11/01/2021   RDW 15.3 06/06/2022   PLT 197.0 06/06/2022   Last metabolic panel Lab Results  Component Value Date   GLUCOSE 87 06/06/2022   NA 140 06/06/2022   K 3.8 06/06/2022   CL 101 06/06/2022   CO2 31 06/06/2022   BUN 9 06/06/2022   CREATININE 0.67 06/06/2022   GFR 103.42 06/06/2022   CALCIUM 9.2 06/06/2022   PROT 7.1 06/06/2022   ALBUMIN 4.0 06/06/2022   BILITOT 0.6 06/06/2022  ALKPHOS 64 06/06/2022   AST 19 06/06/2022   ALT 19 06/06/2022    ANIONGAP 8 04/08/2021   Last lipids Lab Results  Component Value Date   CHOL 212 (H) 06/06/2022   HDL 58.00 06/06/2022   LDLCALC 135 (H) 06/06/2022   TRIG 96.0 06/06/2022   CHOLHDL 4 06/06/2022   Last thyroid functions Lab Results  Component Value Date   TSH 1.60 06/06/2022   Lab Results  Component Value Date   IRON 49 06/06/2022   TIBC 333 06/06/2022   FERRITIN 21 06/06/2022   IMPRESSION AND PLAN:  #1 hypertension, well-controlled on lisinopril 40 mg a day.  #2 adult ADD. We will get her back on methylphenidate CR 27 mg daily.  #3 GAD, doing well on duloxetine 30 mg a day.  4.  Morbid obesity, BMI 47. Multiple comorbidities, including hypertension, osteoarthritis of her back in both knees, and hypercholesterolemia. She has had a Lap-Band procedure in the past and has had a revised and adjusted as well. She has tried many weight loss plans without success. She is in the process of getting set up for sleeve gastrectomy procedure. Will fill out paperwork and right letter medical necessity today.  An After Visit Summary was printed and given to the patient.  FOLLOW UP: Return in about 3 months (around 12/11/2022) for routine chronic illness f/u. Next cpe 05/2023 Signed:  Santiago Bumpers, MD           09/11/2022

## 2022-09-11 NOTE — Telephone Encounter (Signed)
Encounter opened for letter only

## 2022-09-12 ENCOUNTER — Other Ambulatory Visit: Payer: Self-pay

## 2022-09-12 ENCOUNTER — Ambulatory Visit (HOSPITAL_BASED_OUTPATIENT_CLINIC_OR_DEPARTMENT_OTHER)
Admission: RE | Admit: 2022-09-12 | Discharge: 2022-09-12 | Disposition: A | Discharge status: Home / Self Care | Payer: BC Managed Care – PPO | Source: Ambulatory Visit | Attending: Family Medicine | Admitting: Family Medicine

## 2022-09-12 ENCOUNTER — Telehealth: Payer: Self-pay | Admitting: Family Medicine

## 2022-09-12 ENCOUNTER — Other Ambulatory Visit: Payer: Self-pay | Admitting: Family Medicine

## 2022-09-12 DIAGNOSIS — I82441 Acute embolism and thrombosis of right tibial vein: Secondary | ICD-10-CM | POA: Diagnosis not present

## 2022-09-12 DIAGNOSIS — M7989 Other specified soft tissue disorders: Secondary | ICD-10-CM

## 2022-09-12 DIAGNOSIS — I82812 Embolism and thrombosis of superficial veins of left lower extremities: Secondary | ICD-10-CM | POA: Diagnosis not present

## 2022-09-12 DIAGNOSIS — R7989 Other specified abnormal findings of blood chemistry: Secondary | ICD-10-CM | POA: Insufficient documentation

## 2022-09-12 DIAGNOSIS — M79661 Pain in right lower leg: Secondary | ICD-10-CM | POA: Diagnosis not present

## 2022-09-12 DIAGNOSIS — I82412 Acute embolism and thrombosis of left femoral vein: Secondary | ICD-10-CM | POA: Diagnosis not present

## 2022-09-12 LAB — D-DIMER, QUANTITATIVE: D-Dimer, Quant: 6.54 ug{FEU}/mL — ABNORMAL HIGH (ref <0.50)

## 2022-09-12 MED ORDER — APIXABAN 5 MG PO TABS: 5.0000 mg | ORAL_TABLET | Freq: Two times a day (BID) | ORAL | 0 refills | Status: DC

## 2022-09-12 NOTE — Telephone Encounter (Signed)
Pt advised of results/recommendations. Follow up appt scheduled

## 2022-09-12 NOTE — Telephone Encounter (Signed)
The radiology tech just sent me a message that Samantha Doyle's venous Doppler was positive for DVT. I told the tech it was okay to let Samantha Doyle know that she has a clot.  Please call Samantha Doyle and tell her that she needs to start a blood thinner.  I am sending it in to her pharmacy now.  F/u with me in 10-14d. She has to stop taking diclofenac and she needs to avoid any over-the-counter medication for pain other than Tylenol.

## 2022-09-15 ENCOUNTER — Telehealth: Payer: Self-pay | Admitting: Family Medicine

## 2022-09-15 NOTE — Telephone Encounter (Signed)
Pt was notified on Friday of DVT results, eliquis started.  Please further advise.

## 2022-09-15 NOTE — Telephone Encounter (Signed)
Please give Lya a call. She is very concerned about blood clots. She is asking to speak with Dr. Milinda Cave because she has some questions regarding what is going on, and declined to provide much detail but just ask that she receive a call back. She is very concerned because she knows this is the type of clots that can break off and go to other areas in her body. Please give the patient a call to discuss her concerns.

## 2022-09-16 ENCOUNTER — Other Ambulatory Visit: Payer: Self-pay

## 2022-09-16 ENCOUNTER — Emergency Department
Admission: EM | Admit: 2022-09-16 | Discharge: 2022-09-17 | Disposition: A | Discharge status: Home / Self Care | Payer: BC Managed Care – PPO | Attending: Emergency Medicine | Admitting: Emergency Medicine

## 2022-09-16 ENCOUNTER — Encounter: Payer: Self-pay | Admitting: Emergency Medicine

## 2022-09-16 DIAGNOSIS — M25461 Effusion, right knee: Secondary | ICD-10-CM | POA: Insufficient documentation

## 2022-09-16 DIAGNOSIS — M25561 Pain in right knee: Secondary | ICD-10-CM | POA: Diagnosis not present

## 2022-09-16 DIAGNOSIS — M79604 Pain in right leg: Secondary | ICD-10-CM | POA: Diagnosis not present

## 2022-09-16 DIAGNOSIS — R600 Localized edema: Secondary | ICD-10-CM | POA: Diagnosis not present

## 2022-09-16 LAB — COMPREHENSIVE METABOLIC PANEL
ALT: 14 U/L (ref 0–44)
AST: 19 U/L (ref 15–41)
Albumin: 3.6 g/dL (ref 3.5–5.0)
Alkaline Phosphatase: 55 U/L (ref 38–126)
Anion gap: 7 (ref 5–15)
BUN: 10 mg/dL (ref 6–20)
CO2: 29 mmol/L (ref 22–32)
Calcium: 8.7 mg/dL — ABNORMAL LOW (ref 8.9–10.3)
Chloride: 101 mmol/L (ref 98–111)
Creatinine, Ser: 0.63 mg/dL (ref 0.44–1.00)
GFR, Estimated: >60 mL/min (ref >60)
Glucose, Bld: 132 mg/dL — ABNORMAL HIGH (ref 70–99)
Potassium: 4 mmol/L (ref 3.5–5.1)
Sodium: 137 mmol/L (ref 135–145)
Total Bilirubin: 0.7 mg/dL (ref 0.3–1.2)
Total Protein: 7.2 g/dL (ref 6.5–8.1)

## 2022-09-16 LAB — CBC WITH DIFFERENTIAL/PLATELET
Abs Immature Granulocytes: 0.02 10*3/uL (ref 0.00–0.07)
Basophils Absolute: 0 10*3/uL (ref 0.0–0.1)
Basophils Relative: 0 %
Eosinophils Absolute: 0 10*3/uL (ref 0.0–0.5)
Eosinophils Relative: 0 %
HCT: 38.1 % (ref 36.0–46.0)
Hemoglobin: 11.7 g/dL — ABNORMAL LOW (ref 12.0–15.0)
Immature Granulocytes: 0 %
Lymphocytes Relative: 19 %
Lymphs Abs: 1.9 10*3/uL (ref 0.7–4.0)
MCH: 27.3 pg (ref 26.0–34.0)
MCHC: 30.7 g/dL (ref 30.0–36.0)
MCV: 88.8 fL (ref 80.0–100.0)
Monocytes Absolute: 0.7 10*3/uL (ref 0.1–1.0)
Monocytes Relative: 7 %
Neutro Abs: 7.4 10*3/uL (ref 1.7–7.7)
Neutrophils Relative %: 74 %
Platelets: 260 10*3/uL (ref 150–400)
RBC: 4.29 MIL/uL (ref 3.87–5.11)
RDW: 14.6 % (ref 11.5–15.5)
WBC: 10 10*3/uL (ref 4.0–10.5)
nRBC: 0 % (ref 0.0–0.2)

## 2022-09-16 NOTE — Telephone Encounter (Signed)
Documenting that the patient did call back this morning around 11:00 am to check the status of the previous message sent back. I informed the patient that the message had been acknowledged by the medical assistant and she has forwarded the message on to Dr. Milinda Cave to advise.

## 2022-09-16 NOTE — ED Triage Notes (Signed)
Pt presents ambulatory to triage via POV with complaints of R leg pain. Hx of DVT in the R leg and she has had some significant pain in her leg started today. She notes being compliant with medications but she has been standing and more active over the last few days. A&Ox4 at this time. Denies CP or SOB.    [place] from 09/12/2022 IMPRESSION: 1. Acute DVT of the right posterior tibial and peroneal veins. 2. Acute DVT of the left distal femoral, posterior tibial, and peroneal veins. 3. Acute superficial venous thrombosis of the left greater saphenous vein at the level of the knee.

## 2022-09-16 NOTE — ED Notes (Signed)
Discussed pt with Dr. Arnoldo Morale, see orders.

## 2022-09-17 ENCOUNTER — Encounter: Payer: Self-pay | Admitting: Family Medicine

## 2022-09-17 NOTE — ED Provider Notes (Signed)
[facility] Provider Note    Event Date/Time   First MD Initiated Contact with Patient 09/16/22 2336     (approximate)   History   Leg Pain   HPI  Samantha Doyle is a 47 y.o. female who presents to the ED for evaluation of Leg Pain   I review a PCP visit from 9/5.  Morbidly obese and has had a gastric band in the past.  Planning on progressing to a gastric sleeve.  Was complaining of acute on chronic right leg pain and so an outpatient venous Doppler of the bilateral legs was performed and demonstrates a DVT of bilateral legs.  Right PT and peroneal, left distal femoral, PT and peroneal..  Patient presents to the ED for evaluation of right knee pain and swelling for the past week or so.  She reports compliance with Eliquis twice daily, stopping all NSAIDs.  Also reports a more chronic history of arthritis to both of her knees and has had steroid injections in the past.  No chest pain, dyspnea, syncope or skin changes to the legs beyond the swelling around the right knee  She reports concern that the blood clots in her leg may have broken off, that they might kill her and other concerns related to her recently diagnosed DVT.  No falls, trauma or injuries  Physical Exam   Triage Vital Signs: ED Triage Vitals [09/16/22 2204]  Encounter Vitals Group     BP (!) 148/78     Systolic BP Percentile      Diastolic BP Percentile      Pulse Rate 93     Resp 18     Temp 98.3 F (36.8 C)     Temp src      SpO2 100 %     Weight 263 lb 14.3 oz (119.7 kg)     Height 5\' 3"  (1.6 m)     Head Circumference      Peak Flow      Pain Score 10     Pain Loc      Pain Education      Exclude from Growth Chart     Most recent vital signs: Vitals:   09/16/22 2204  BP: (!) 148/78  Pulse: 93  Resp: 18  Temp: 98.3 F (36.8 C)  SpO2: 100%    General: Awake, no distress.  CV:  Good peripheral perfusion.  Resp:  Normal effort.  Abd:  No distention.  MSK:  No  deformity noted.  Obese legs with fairly symmetric lower extremity edema as pictured below.  Right knee does seem somewhat more swollen when compared to the left and with palpation I do appreciate a small effusion. Neuro:  No focal deficits appreciated. Other:     ED Results / Procedures / Treatments   Labs (all labs ordered are listed, but only abnormal results are displayed) Labs Reviewed  CBC WITH DIFFERENTIAL/PLATELET - Abnormal; Notable for the following components:      Result Value   Hemoglobin 11.7 (*)    All other components within normal limits  COMPREHENSIVE METABOLIC PANEL - Abnormal; Notable for the following components:   Glucose, Bld 132 (*)    Calcium 8.7 (*)    All other components within normal limits    EKG   RADIOLOGY   Official radiology report(s): No results found.  PROCEDURES and INTERVENTIONS:  Procedures  Medications - No data to display   IMPRESSION / MDM / ASSESSMENT AND  PLAN / ED COURSE  I reviewed the triage vital signs and the nursing notes.  Differential diagnosis includes, but is not limited to, DVT, PE, cellulitis, phlegmasia cerulea dolens, hemorrhagic knee effusion, chronic arthritis, septic joint or gout  {Patient presents with symptoms of an acute illness or injury that is potentially life-threatening.  Patient with recently diagnosed DVT presents with acute on chronic right knee pain and mild swelling.  I suspect a multifactorial degree of her swelling and pain from lack of typical NSAIDs, she is to be compliant with diclofenac, the known DVT to this area and arthritis in the setting of Eliquis could precipitate a worsening hemorrhagic joint effusion.  Looks well without indications for diagnostics to look for PE.  No cardiopulmonary symptoms or stigmata of phlegmasia cerulea dolens.  No evidence of arterial ischemia.   We discussed supportive measures, pain control that is safe in the setting of her Eliquis, PCP follow-up and close  return precautions.  Answered many questions      FINAL CLINICAL IMPRESSION(S) / ED DIAGNOSES   Final diagnoses:  Right leg pain  Knee effusion, right     Rx / DC Orders   ED Discharge Orders     None        Note:  This document was prepared using Dragon voice recognition software and may include unintentional dictation errors.   Delton Prairie, MD 09/17/22 [phone]

## 2022-09-17 NOTE — Discharge Instructions (Addendum)
As we discussed, likely multifactorial source of swelling and pain.  Blood clots Arthritis and joint swelling/effusion probably worsening with the blood thinner Lack of NSAIDs  Continuing your compression socks, and ice and elevation when able to do so.  Keep taking your Eliquis  Follow-up with your PCP

## 2022-09-17 NOTE — Telephone Encounter (Signed)
No further action needed.

## 2022-09-18 ENCOUNTER — Ambulatory Visit: Payer: BC Managed Care – PPO | Admitting: Family Medicine

## 2022-09-22 ENCOUNTER — Encounter: Payer: Self-pay | Admitting: Family Medicine

## 2022-09-22 ENCOUNTER — Ambulatory Visit (INDEPENDENT_AMBULATORY_CARE_PROVIDER_SITE_OTHER): Payer: BC Managed Care – PPO | Admitting: Family Medicine

## 2022-09-22 VITALS — BP 97/68 | HR 74 | Wt 257.4 lb

## 2022-09-22 DIAGNOSIS — M25561 Pain in right knee: Secondary | ICD-10-CM | POA: Diagnosis not present

## 2022-09-22 DIAGNOSIS — M1711 Unilateral primary osteoarthritis, right knee: Secondary | ICD-10-CM

## 2022-09-22 DIAGNOSIS — I82403 Acute embolism and thrombosis of unspecified deep veins of lower extremity, bilateral: Secondary | ICD-10-CM | POA: Diagnosis not present

## 2022-09-22 DIAGNOSIS — M25461 Effusion, right knee: Secondary | ICD-10-CM

## 2022-09-22 NOTE — Progress Notes (Signed)
OFFICE VISIT  09/22/2022  CC:  Chief Complaint  Patient presents with   Follow-up    ED F/U> Was seen for severe R knee pain due to blood clots. ER Dr. Catalina Pizza her that all labs looked great. Has app with ortho on Friday for knee. Pain is still severe today, 7/10. Pt very upset with situation.     Patient is a 47 y.o. female who presents for follow-up of bilateral leg DVT diagnosed 09/12/2022 by venous Doppler ultrasound.  INTERIM HX: She started Eliquis as instructed. She went to the emergency department 09/16/2022 for acute severe right knee pain and swelling.  It started a few days after she started Eliquis.  She was on meloxicam and had to discontinue it due to starting Eliquis. Her knee has never hurt to this severity before.   She has been taking a few extra oxycodone tabs and she has made her pain management physician aware of this.  In the emergency department: CBC normal except hemoglobin mildly low at 11.7.  This is down from 13.1 about 3 months ago. ED evaluation was reassuring--no changes made. Overall she feels like her lower legs feel softer and less tight since getting on the Eliquis.  ROS as above, plus--> no fevers,  no dizziness, no HAs, no rashes, no melena/hematochezia.   No focal weakness, paresthesias, or tremors.  No blood in urine.  Nose nosebleeds.  No n/v/d or abd pain.  No palpitations.    Past Medical History:  Diagnosis Date   Allergic rhinitis    Bilateral primary osteoarthritis of knee    severe   Carpal tunnel syndrome    right wrist, numb all the time   Cervical dysplasia    Chronic back pain    Degenerative lumbar spondylosis with grade 2 spondylolisthesis L4 on L5 with bilater pars defects L4.    COVID-19 virus infection 02/06/2020   DDD (degenerative disc disease), lumbar    MRI 10/2015: grade I (7mm) anterolisthesis L4 on L5, w/ disc herniation with encroachment on spinal nerves at L4-S1 levels.   Depression    Edema of both lower extremities due  to peripheral venous insufficiency    +varicose veins (hx of vein stripping 2009)   GERD (gastroesophageal reflux disease)    worsened 07/2018 likely associated with lap band being too constricting->increased pantoprazole to 40mg  bid at that time.   History of chronic bronchitis    hx of when she was a smoker: ? mild intermittent asthma?--much better since stopped smoking 2018.   History of fracture of phalanx of finger    left, 4th finger distal phalanx   History of non anemic vitamin B12 deficiency    s/p lap band surgery per pt report.   Hyperlipidemia    Hypertension    Iron deficiency anemia 01/2019   hemoccults ordered but never turned in (01/2019-01/2020). Iron supp started 02/2020. Hemoccults neg 07/2020, plan for iron infusion. 08/2020 EGD with SB bx normal, no source of IDA seen on colonoscopy. Iron infusion 09/06/2020   LAP-BAND surgery status    Pt getting lap band removed and is getting gastric sleave after.   Migraine syndrome    Ovarian cyst 10/2015   LEFT, 4 cm--noted on L spine MRI w/out contrast.  F/u u/s imaging showed simple cyst.   Spondylarthrosis    Tobacco dependence in remission    quit 2018    Past Surgical History:  Procedure Laterality Date   BACK SURGERY  2019   CARPAL TUNNEL RELEASE  Left    CERVICAL CONE BIOPSY     COLONOSCOPY     2015 normal.  08/2020 (for IDA)->incomplete prep, no adenomas->rpt 5 yrs d/t incomplete prep.   ESOPHAGOGASTRODUODENOSCOPY N/A 04/27/2014   Procedure: ESOPHAGOGASTRODUODENOSCOPY (EGD);  Surgeon: Ovidio Kin, MD;  Location: [facility];  Service: General;  Laterality: N/A;   ESOPHAGOGASTRODUODENOSCOPY  08/29/2020   6 cm hiatal hernia noted, o/w normal, SB bx neg.  ?hernia contributing to pt's IDA?   GASTRIC BANDING PORT REVISION N/A 10/02/2014   Procedure: LAPROSCOPIC PLACEMENT OF LAP BAND PORT;  Surgeon: Glenna Fellows, MD;  Location: [facility];  Service: General;  Laterality: N/A;   LAPAROSCOPIC GASTRIC BANDING  03/2007   [facility] -  [facility]; Dr Jorja Loa Hipp   LAPAROSCOPIC REVISION OF GASTRIC BAND N/A 05/17/2012   Procedure: LAPAROSCOPIC REVISION OF SLIPPED GASTRIC BAND;  Surgeon: Mariella Saa, MD;  Location: [facility];  Service: General;  Laterality: N/A;   LAPAROSCOPY N/A 04/30/2014   Procedure: DIAGNOSTIC LAPAROSCOPY WITH REMOVAL OF INFECTED LAP BAND PORT WITH DEBRIDEMENT SUBCUTANEOUS ABSCESS;  Surgeon: Gaynelle Adu, MD;  Location: [facility];  Service: General;  Laterality: N/A;   VARICOSE VEIN SURGERY Bilateral 2009    Outpatient Medications Prior to Visit  Medication Sig Dispense Refill   apixaban (ELIQUIS) 5 MG TABS tablet Take 1 tablet (5 mg total) by mouth 2 (two) times daily. 60 tablet 0   baclofen (LIORESAL) 20 MG tablet Take 10-20 mg by mouth every 8 (eight) hours.     cetirizine (ZYRTEC) 10 MG tablet Take 20 mg by mouth daily as needed for allergies (allergies).     diclofenac (VOLTAREN) 75 MG EC tablet Take 1 tablet by mouth 2 (two) times daily.      DULoxetine (CYMBALTA) 30 MG capsule Take 1 capsule (30 mg total) by mouth daily. 90 capsule 1   gabapentin (NEURONTIN) 800 MG tablet Take 800 mg by mouth in the morning, at noon, in the evening, and at bedtime.  6   lisinopril (ZESTRIL) 40 MG tablet Take 1 tablet (40 mg total) by mouth daily. 90 tablet 1   methylphenidate 27 MG PO CR tablet Take 1 tablet (27 mg total) by mouth every morning. 30 tablet 0   Multiple Vitamin (MULITIVITAMIN WITH MINERALS) TABS Take 1 tablet by mouth daily.     NARCAN 4 MG/0.1ML LIQD nasal spray kit      oxyCODONE (ROXICODONE) 15 MG immediate release tablet Take 15 mg by mouth every 6 (six) hours as needed for pain.     oxyCODONE (ROXICODONE) 15 MG immediate release tablet Take 15 mg by mouth every 6 (six) hours. Do not fill until 06/24/2022 for 30 days.     Oxycodone HCl 10 MG TABS Take 10 mg by mouth every 6 (six) hours. Do not fill until 05/26/2022 for 30 days.     pantoprazole (PROTONIX) 40 MG tablet Take 1  tablet (40 mg total) by mouth 2 (two) times daily. 180 tablet 1   rizatriptan (MAXALT-MLT) 10 MG disintegrating tablet TAKE 1 TABLET BY MOUTH AS NEEDED FOR MIGRAINE. MAY REPEAT IN 2 HOURS IF NEEDED 10 tablet 0   SODIUM FLUORIDE, DENTAL RINSE, 0.2 % SOLN Take by mouth.     albuterol (VENTOLIN HFA) 108 (90 Base) MCG/ACT inhaler INHALE 2 PUFFS INTO THE LUNGS EVERY 4 (FOUR) HOURS AS NEEDED FOR WHEEZING OR SHORTNESS OF BREATH. (Patient not taking: Reported on 09/11/2022) 8 g 0   No facility-administered medications prior to visit.  Allergies  Allergen Reactions   Vancomycin Itching and Anaphylaxis    Face/back warm and red.  Pt lips and tongue swelled, hives   Demerol Hives and Nausea And Vomiting   Demerol  [Meperidine Hcl] Other (See Comments)   Meperidine Nausea And Vomiting and Hives   Microplegia Msa-Msg  [Cardioplegia Del Nido Formula]     Other reaction(s): headache   Monosodium Glutamate Nausea And Vomiting and Other (See Comments)    migraines Other reaction(s): headache   Tape Rash    Plastic tape    Review of Systems As per HPI  PE:    09/22/2022   11:40 AM 09/17/2022    1:22 AM 09/16/2022   10:04 PM  Vitals with BMI  Height   5\' 3"   Weight 257 lbs 6 oz  263 lbs 14 oz  BMI 45.61  46.76  Systolic 97 113 148  Diastolic 68 73 78  Pulse 74 80 93     Physical Exam  Gen: Alert, well appearing.  Patient is oriented to person, place, time, and situation. Right knee with generalized swelling and warmth.  No erythema.  Active range of motion limited to about 45 degrees flexion.  Passive right knee flexion to about 75 degrees.  She has some tenderness to palpation medially as well as a little less tenderness in the popliteal region on the right knee. No popliteal fullness or mass.  LABS:  Last CBC Lab Results  Component Value Date   WBC 10.0 09/16/2022   HGB 11.7 (L) 09/16/2022   HCT 38.1 09/16/2022   MCV 88.8 09/16/2022   MCH 27.3 09/16/2022   RDW 14.6 09/16/2022    PLT 260 09/16/2022   Last metabolic panel Lab Results  Component Value Date   GLUCOSE 132 (H) 09/16/2022   NA 137 09/16/2022   K 4.0 09/16/2022   CL 101 09/16/2022   CO2 29 09/16/2022   BUN 10 09/16/2022   CREATININE 0.63 09/16/2022   GFRNONAA >60 09/16/2022   CALCIUM 8.7 (L) 09/16/2022   PROT 7.2 09/16/2022   ALBUMIN 3.6 09/16/2022   BILITOT 0.7 09/16/2022   ALKPHOS 55 09/16/2022   AST 19 09/16/2022   ALT 14 09/16/2022   ANIONGAP 7 09/16/2022   Lab Results  Component Value Date   DDIMER 6.54 (H) 09/11/2022   IMPRESSION AND PLAN:  #1 acute right knee pain in the setting of chronic knee osteoarthritis. She does have a history of bilateral knee pain but it had not been bothering her to any significance since getting steroid injections by orthopedics in June this year. Meloxicam may have been helping much more than she had realized because this current pain started only a few days after stopping it. On bedside ultrasound today the knee effusion was compressible and did not have definite internal echoes to suggest hemorrhagic effusion.  Small Baker's cyst present. Both legs nontender and not erythematous.  No pitting edema. Plan is to return tomorrow for her knee aspiration.  #2 acute bilateral lower extremity DVT diagnosed on 09/12/22 and she has been on Eliquis since then.   Unprovoked. Plan minimum of 3 months on Eliquis. Will initiate heme-onc referral.  An After Visit Summary was printed and given to the patient.  FOLLOW UP: Return for Follow-up tomorrow for right knee aspiration.  Signed:  Santiago Bumpers, MD           09/22/2022

## 2022-09-23 ENCOUNTER — Encounter: Payer: Self-pay | Admitting: Family Medicine

## 2022-09-23 ENCOUNTER — Ambulatory Visit: Payer: BC Managed Care – PPO | Admitting: Family Medicine

## 2022-09-23 VITALS — BP 121/70 | HR 75 | Temp 98.1°F | Ht 63.0 in

## 2022-09-23 DIAGNOSIS — M7989 Other specified soft tissue disorders: Secondary | ICD-10-CM

## 2022-09-23 DIAGNOSIS — M25561 Pain in right knee: Secondary | ICD-10-CM | POA: Diagnosis not present

## 2022-09-23 MED ORDER — TRIAMCINOLONE ACETONIDE 40 MG/ML IJ SUSP
40.0000 mg | Freq: Once | INTRAMUSCULAR | Status: AC
Start: 2022-09-23 — End: 2022-09-23
  Administered 2022-09-23: 40 mg via INTRA_ARTICULAR

## 2022-09-23 NOTE — Progress Notes (Signed)
OFFICE VISIT  09/23/2022  CC:  Chief Complaint  Patient presents with   Knee Aspiration    Patient is a 47 y.o. female who presents for right knee aspiration today. I last saw her yesterday. A/P at that time: "#1 acute right knee pain in the setting of chronic knee osteoarthritis. She does have a history of bilateral knee pain but it had not been bothering her to any significance since getting steroid injections by orthopedics in June this year. Meloxicam may have been helping much more than she had realized because this current pain started only a few days after stopping it. On bedside ultrasound today the knee effusion was compressible and did not have definite internal echoes to suggest hemorrhagic effusion.  Small Baker's cyst present. Both legs nontender and not erythematous.  No pitting edema. Plan is to return tomorrow for her knee aspiration.   #2 acute bilateral lower extremity DVT diagnosed on 09/12/22 and she has been on Eliquis since then.   Unprovoked. Plan minimum of 3 months on Eliquis (ending 12/12/22). Will initiate heme-onc referral."  INTERIM HX: Knee hurts the same.  She feels like it may be even a little more swollen than yesterday. No fevers or malaise.  Past Medical History:  Diagnosis Date   Allergic rhinitis    Bilateral primary osteoarthritis of knee    severe   Carpal tunnel syndrome    right wrist, numb all the time   Cervical dysplasia    Chronic back pain    Degenerative lumbar spondylosis with grade 2 spondylolisthesis L4 on L5 with bilater pars defects L4.    COVID-19 virus infection 02/06/2020   DDD (degenerative disc disease), lumbar    MRI 10/2015: grade I (7mm) anterolisthesis L4 on L5, w/ disc herniation with encroachment on spinal nerves at L4-S1 levels.   Depression    Edema of both lower extremities due to peripheral venous insufficiency    +varicose veins (hx of vein stripping 2009)   GERD (gastroesophageal reflux disease)    worsened  07/2018 likely associated with lap band being too constricting->increased pantoprazole to 40mg  bid at that time.   History of chronic bronchitis    hx of when she was a smoker: ? mild intermittent asthma?--much better since stopped smoking 2018.   History of fracture of phalanx of finger    left, 4th finger distal phalanx   History of non anemic vitamin B12 deficiency    s/p lap band surgery per pt report.   Hyperlipidemia    Hypertension    Iron deficiency anemia 01/2019   hemoccults ordered but never turned in (01/2019-01/2020). Iron supp started 02/2020. Hemoccults neg 07/2020, plan for iron infusion. 08/2020 EGD with SB bx normal, no source of IDA seen on colonoscopy. Iron infusion 09/06/2020   LAP-BAND surgery status    Pt getting lap band removed and is getting gastric sleave after.   Migraine syndrome    Ovarian cyst 10/2015   LEFT, 4 cm--noted on L spine MRI w/out contrast.  F/u u/s imaging showed simple cyst.   Spondylarthrosis    Tobacco dependence in remission    quit 2018    Past Surgical History:  Procedure Laterality Date   BACK SURGERY  2019   CARPAL TUNNEL RELEASE Left    CERVICAL CONE BIOPSY     COLONOSCOPY     2015 normal.  08/2020 (for IDA)->incomplete prep, no adenomas->rpt 5 yrs d/t incomplete prep.   ESOPHAGOGASTRODUODENOSCOPY N/A 04/27/2014   Procedure: ESOPHAGOGASTRODUODENOSCOPY (EGD);  Surgeon: Ovidio Kin, MD;  Location: [facility];  Service: General;  Laterality: N/A;   ESOPHAGOGASTRODUODENOSCOPY  08/29/2020   6 cm hiatal hernia noted, o/w normal, SB bx neg.  ?hernia contributing to pt's IDA?   GASTRIC BANDING PORT REVISION N/A 10/02/2014   Procedure: LAPROSCOPIC PLACEMENT OF LAP BAND PORT;  Surgeon: Glenna Fellows, MD;  Location: [facility];  Service: General;  Laterality: N/A;   LAPAROSCOPIC GASTRIC BANDING  03/2007   [facility] - [facility]; Dr Jorja Loa Hipp   LAPAROSCOPIC REVISION OF GASTRIC BAND N/A 05/17/2012   Procedure: LAPAROSCOPIC REVISION  OF SLIPPED GASTRIC BAND;  Surgeon: Mariella Saa, MD;  Location: [facility];  Service: General;  Laterality: N/A;   LAPAROSCOPY N/A 04/30/2014   Procedure: DIAGNOSTIC LAPAROSCOPY WITH REMOVAL OF INFECTED LAP BAND PORT WITH DEBRIDEMENT SUBCUTANEOUS ABSCESS;  Surgeon: Gaynelle Adu, MD;  Location: [facility];  Service: General;  Laterality: N/A;   VARICOSE VEIN SURGERY Bilateral 2009    Outpatient Medications Prior to Visit  Medication Sig Dispense Refill   apixaban (ELIQUIS) 5 MG TABS tablet Take 1 tablet (5 mg total) by mouth 2 (two) times daily. 60 tablet 0   baclofen (LIORESAL) 20 MG tablet Take 10-20 mg by mouth every 8 (eight) hours.     cetirizine (ZYRTEC) 10 MG tablet Take 20 mg by mouth daily as needed for allergies (allergies).     diclofenac (VOLTAREN) 75 MG EC tablet Take 1 tablet by mouth 2 (two) times daily.      DULoxetine (CYMBALTA) 30 MG capsule Take 1 capsule (30 mg total) by mouth daily. 90 capsule 1   gabapentin (NEURONTIN) 800 MG tablet Take 800 mg by mouth in the morning, at noon, in the evening, and at bedtime.  6   lisinopril (ZESTRIL) 40 MG tablet Take 1 tablet (40 mg total) by mouth daily. 90 tablet 1   methylphenidate 27 MG PO CR tablet Take 1 tablet (27 mg total) by mouth every morning. 30 tablet 0   Multiple Vitamin (MULITIVITAMIN WITH MINERALS) TABS Take 1 tablet by mouth daily.     NARCAN 4 MG/0.1ML LIQD nasal spray kit      oxyCODONE (ROXICODONE) 15 MG immediate release tablet Take 15 mg by mouth every 6 (six) hours as needed for pain.     oxyCODONE (ROXICODONE) 15 MG immediate release tablet Take 15 mg by mouth every 6 (six) hours. Do not fill until 06/24/2022 for 30 days.     Oxycodone HCl 10 MG TABS Take 10 mg by mouth every 6 (six) hours. Do not fill until 05/26/2022 for 30 days.     pantoprazole (PROTONIX) 40 MG tablet Take 1 tablet (40 mg total) by mouth 2 (two) times daily. 180 tablet 1   rizatriptan (MAXALT-MLT) 10 MG disintegrating tablet TAKE 1 TABLET BY  MOUTH AS NEEDED FOR MIGRAINE. MAY REPEAT IN 2 HOURS IF NEEDED 10 tablet 0   SODIUM FLUORIDE, DENTAL RINSE, 0.2 % SOLN Take by mouth.     albuterol (VENTOLIN HFA) 108 (90 Base) MCG/ACT inhaler INHALE 2 PUFFS INTO THE LUNGS EVERY 4 (FOUR) HOURS AS NEEDED FOR WHEEZING OR SHORTNESS OF BREATH. (Patient not taking: Reported on 09/11/2022) 8 g 0   No facility-administered medications prior to visit.    Allergies  Allergen Reactions   Vancomycin Itching and Anaphylaxis    Face/back warm and red.  Pt lips and tongue swelled, hives   Demerol Hives and Nausea And Vomiting   Demerol  [Meperidine Hcl] Other (  See Comments)   Meperidine Nausea And Vomiting and Hives   Microplegia Msa-Msg  [Cardioplegia Del Nido Formula]     Other reaction(s): headache   Monosodium Glutamate Nausea And Vomiting and Other (See Comments)    migraines Other reaction(s): headache   Tape Rash    Plastic tape    Review of Systems As per HPI  PE:    09/23/2022   11:19 AM 09/22/2022   11:40 AM 09/17/2022    1:22 AM  Vitals with BMI  Height 5\' 3"     Weight  257 lbs 6 oz   BMI  45.61   Systolic 121 97 113  Diastolic 70 68 73  Pulse 75 74 80     Physical Exam  Right knee without erythema.  It does feel diffusely warm.  Moderate effusion noted on exam.  LABS:  Last CBC Lab Results  Component Value Date   WBC 10.0 09/16/2022   HGB 11.7 (L) 09/16/2022   HCT 38.1 09/16/2022   MCV 88.8 09/16/2022   MCH 27.3 09/16/2022   RDW 14.6 09/16/2022   PLT 260 09/16/2022   Last metabolic panel Lab Results  Component Value Date   GLUCOSE 132 (H) 09/16/2022   NA 137 09/16/2022   K 4.0 09/16/2022   CL 101 09/16/2022   CO2 29 09/16/2022   BUN 10 09/16/2022   CREATININE 0.63 09/16/2022   GFRNONAA >60 09/16/2022   CALCIUM 8.7 (L) 09/16/2022   PROT 7.2 09/16/2022   ALBUMIN 3.6 09/16/2022   BILITOT 0.7 09/16/2022   ALKPHOS 55 09/16/2022   AST 19 09/16/2022   ALT 14 09/16/2022   ANIONGAP 7 09/16/2022    IMPRESSION AND PLAN:  Right knee osteoarthritis with effusion. Aspiration today.  See progress note dated 09/22/2022 for further details.  Ultrasound-guided injection is preferred based on studies that show increased duration, increased effect, greater accuracy, decreased procedural pain, increased response rate, and decreased cost with ultrasound-guided versus blind injection. Procedure: Real-time ultrasound guided aspiration and injection of right knee. Device: GE [employer] informed consent obtained.  Timeout conducted.  No overlying erythema, induration, or other signs of local infection. Moderate suprapatellar effusion extending into lateral and medial gutters. After sterile prep with Betadine, used 18-gauge needle and 60 cc syringe to aspirate straw-colored fluid--approximately 15 cc.  At the end of the aspirate there was some blood-tinged fluid.  No clots. Since no hemarthrosis and the aspirated fluid does not look infected I chose to inject 40 mg of Kenalog + 2 mL of 1% plain lidocaine after the aspiration today. Patient tolerated the procedure well.  No immediate complications.  Post-injection care discussed. Advised to call if fever/chills, erythema, drainage, or persistent bleeding.  Impression: Technically successful ultrasound-guided injection.  She has follow-up with her orthopedist in 3 days.  An After Visit Summary was printed and given to the patient.  FOLLOW UP: Return for Keep appointment set for December of this year.  Signed:  Santiago Bumpers, MD           09/23/2022

## 2022-09-26 DIAGNOSIS — S83281A Other tear of lateral meniscus, current injury, right knee, initial encounter: Secondary | ICD-10-CM | POA: Diagnosis not present

## 2022-09-26 DIAGNOSIS — M1711 Unilateral primary osteoarthritis, right knee: Secondary | ICD-10-CM | POA: Diagnosis not present

## 2022-10-01 ENCOUNTER — Other Ambulatory Visit: Payer: Self-pay | Admitting: Orthopedic Surgery

## 2022-10-01 DIAGNOSIS — S83281A Other tear of lateral meniscus, current injury, right knee, initial encounter: Secondary | ICD-10-CM

## 2022-10-10 ENCOUNTER — Other Ambulatory Visit: Payer: Self-pay

## 2022-10-10 MED ORDER — APIXABAN 5 MG PO TABS: 5.0000 mg | ORAL_TABLET | Freq: Two times a day (BID) | ORAL | 4 refills | Status: DC

## 2022-10-10 NOTE — Telephone Encounter (Signed)
Pt is requesting refill on Eliquis. Next OV is 12/6.

## 2022-10-16 ENCOUNTER — Telehealth: Payer: Self-pay

## 2022-10-16 NOTE — Telephone Encounter (Signed)
Transition Care Management Unsuccessful Follow-up Telephone Call  Date of discharge and from where:  09/17/2022 [facility]  Attempts:  1st Attempt  Reason for unsuccessful TCM follow-up call:  Left voice message  Tyrone Pautsch [facility]  [facility], [facility] Guide Direct Dial: [phone]  Website: [email or web address]

## 2022-10-17 ENCOUNTER — Telehealth: Payer: Self-pay

## 2022-10-17 NOTE — Telephone Encounter (Signed)
Transition Care Management Follow-up Telephone Call Date of discharge and from where: 09/17/2022 [facility] How have you been since you were released from the hospital? Patient is not feeling any better, still having knee pain. Any questions or concerns? No  Items Reviewed: Did the pt receive and understand the discharge instructions provided? Yes  Medications obtained and verified?  No medication prescribed. Other? No  Any new allergies since your discharge? No  Dietary orders reviewed? Yes Do you have support at home? Yes   Follow up appointments reviewed:  PCP Hospital f/u appt confirmed? Yes  Scheduled to see Jeoffrey Massed, MD on 09/22/2022 @ [facility]. Specialist Hospital f/u appt confirmed? Yes  Scheduled to see Earna Coder A. Audelia Acton, MD on 09/26/2022 @ [facility]. Are transportation arrangements needed? No  If their condition worsens, is the pt aware to call PCP or go to the Emergency Dept.? Yes Was the patient provided with contact information for the PCP's office or ED? Yes Was to pt encouraged to call back with questions or concerns? Yes   Sahith Nurse [facility]  [facility] Direct Dial: [phone]  Website: [email or web address]

## 2022-10-22 ENCOUNTER — Other Ambulatory Visit: Payer: Self-pay | Admitting: Orthopedic Surgery

## 2022-10-22 ENCOUNTER — Inpatient Hospital Stay (HOSPITAL_BASED_OUTPATIENT_CLINIC_OR_DEPARTMENT_OTHER): Payer: BC Managed Care – PPO | Admitting: Medical Oncology

## 2022-10-22 ENCOUNTER — Inpatient Hospital Stay: Payer: BC Managed Care – PPO | Attending: Medical Oncology

## 2022-10-22 ENCOUNTER — Encounter: Payer: Self-pay | Admitting: Medical Oncology

## 2022-10-22 ENCOUNTER — Ambulatory Visit (HOSPITAL_BASED_OUTPATIENT_CLINIC_OR_DEPARTMENT_OTHER)
Admission: RE | Admit: 2022-10-22 | Discharge: 2022-10-22 | Disposition: A | Discharge status: Home / Self Care | Payer: BC Managed Care – PPO | Source: Ambulatory Visit | Attending: Medical Oncology | Admitting: Medical Oncology

## 2022-10-22 ENCOUNTER — Other Ambulatory Visit: Payer: Self-pay

## 2022-10-22 VITALS — BP 135/88 | HR 66 | Temp 98.0°F | Resp 18 | Ht 63.0 in | Wt 259.0 lb

## 2022-10-22 DIAGNOSIS — I824Z3 Acute embolism and thrombosis of unspecified deep veins of distal lower extremity, bilateral: Secondary | ICD-10-CM | POA: Insufficient documentation

## 2022-10-22 DIAGNOSIS — Z87891 Personal history of nicotine dependence: Secondary | ICD-10-CM | POA: Diagnosis not present

## 2022-10-22 DIAGNOSIS — Z8249 Family history of ischemic heart disease and other diseases of the circulatory system: Secondary | ICD-10-CM | POA: Diagnosis not present

## 2022-10-22 DIAGNOSIS — I2699 Other pulmonary embolism without acute cor pulmonale: Secondary | ICD-10-CM | POA: Insufficient documentation

## 2022-10-22 DIAGNOSIS — Z7901 Long term (current) use of anticoagulants: Secondary | ICD-10-CM

## 2022-10-22 DIAGNOSIS — M79604 Pain in right leg: Secondary | ICD-10-CM | POA: Diagnosis not present

## 2022-10-22 DIAGNOSIS — M7989 Other specified soft tissue disorders: Secondary | ICD-10-CM | POA: Diagnosis not present

## 2022-10-22 DIAGNOSIS — M79605 Pain in left leg: Secondary | ICD-10-CM | POA: Diagnosis not present

## 2022-10-22 DIAGNOSIS — S83281A Other tear of lateral meniscus, current injury, right knee, initial encounter: Secondary | ICD-10-CM

## 2022-10-22 LAB — CBC
HCT: 36.9 % (ref 36.0–46.0)
Hemoglobin: 11.6 g/dL — ABNORMAL LOW (ref 12.0–15.0)
MCH: 26.8 pg (ref 26.0–34.0)
MCHC: 31.4 g/dL (ref 30.0–36.0)
MCV: 85.2 fL (ref 80.0–100.0)
Platelets: 220 10*3/uL (ref 150–400)
RBC: 4.33 MIL/uL (ref 3.87–5.11)
RDW: 14.1 % (ref 11.5–15.5)
WBC: 8.1 10*3/uL (ref 4.0–10.5)
nRBC: 0 % (ref 0.0–0.2)

## 2022-10-22 LAB — CMP (CANCER CENTER ONLY)
ALT: 12 U/L (ref 0–44)
AST: 14 U/L — ABNORMAL LOW (ref 15–41)
Albumin: 3.8 g/dL (ref 3.5–5.0)
Alkaline Phosphatase: 57 U/L (ref 38–126)
Anion gap: 7 (ref 5–15)
BUN: 11 mg/dL (ref 6–20)
CO2: 32 mmol/L (ref 22–32)
Calcium: 9.4 mg/dL (ref 8.9–10.3)
Chloride: 101 mmol/L (ref 98–111)
Creatinine: 0.71 mg/dL (ref 0.44–1.00)
GFR, Estimated: >60 mL/min (ref >60)
Glucose, Bld: 93 mg/dL (ref 70–99)
Potassium: 3.6 mmol/L (ref 3.5–5.1)
Sodium: 140 mmol/L (ref 135–145)
Total Bilirubin: 0.3 mg/dL (ref 0.3–1.2)
Total Protein: 7.1 g/dL (ref 6.5–8.1)

## 2022-10-22 LAB — ANTITHROMBIN III: AntiThromb III Func: 116 % (ref 75–120)

## 2022-10-22 NOTE — Progress Notes (Signed)
[facility] [phone]   [phone]  INITIAL CONSULT NOTE  Patient Care Team: Jeoffrey Massed, MD as PCP - General (Family Medicine) Gaynelle Adu, MD as Consulting Physician (General Surgery) Burtis Junes, MD as Consulting Physician (Pain Medicine) Bernette Redbird, MD as Consulting Physician (Gastroenterology) Reinaldo Berber, MD as Consulting Physician (Orthopedic Surgery)  CHIEF COMPLAINTS/PURPOSE OF CONSULTATION:  Acute DVT Left Leg   HISTORY OF PRESENTING ILLNESS:  Samantha Doyle 47 y.o. female is referred to our office by their PCP Dr. Milinda Cave for management of her acute left lower leg DVT.   Patient was seen on 09/11/2022 by PCP for knee pain. Doppler from 09/12/2022 showed DVT of bilateral legs: Right PT and peroneal, left distal femoral, PT and peroneal. She was started on Eliquis and was informed to stop her oral diclofenac. She then was seen in the ER on 09/16/2022 for worsening right knee pain and swelling. She was found to have edema of the leg. Suspected to be secondary to stopping her typical NSAIDs when she started Eliquis. She was referred to orthopedics who diagnosed her with a acute lateral meniscal tear of the right knee. An MRI has been ordered which is scheduled for October 31st. Today she reports that the pain and swelling is intolerable.   She is using a knee brace, cane, compression socks and taking tylenol for her pain. This has not offered any relief.   She reports no history of immobility, recent airplane ride, drive longer than 2 hours or surgeries. She reports no known recent traumas or injuries. No estrogen or OCP use. Former smoker (over 6 months ago). No known cancer.   She is taking her Eliquis as directed without any missed doses. She denies any side effects.    She does have a family history of blood clots of her mother and both grandmothers. No known clotting disorders.   She denies any SOB, chest pain.  There has been no bleeding to her knowledge: denies epistaxis, gingivitis, hemoptysis, hematemesis, hematuria, melena, excessive bruising, blood donation.    She works on IT trainer for a living. She usually is standing or walking most of the day.    MEDICAL HISTORY:  Past Medical History:  Diagnosis Date   Allergic rhinitis    Bilateral primary osteoarthritis of knee    severe   Carpal tunnel syndrome    right wrist, numb all the time   Cervical dysplasia    Chronic back pain    Degenerative lumbar spondylosis with grade 2 spondylolisthesis L4 on L5 with bilater pars defects L4.    COVID-19 virus infection 02/06/2020   DDD (degenerative disc disease), lumbar    MRI 10/2015: grade I (7mm) anterolisthesis L4 on L5, w/ disc herniation with encroachment on spinal nerves at L4-S1 levels.   Depression    Edema of both lower extremities due to peripheral venous insufficiency    +varicose veins (hx of vein stripping 2009)   GERD (gastroesophageal reflux disease)    worsened 07/2018 likely associated with lap band being too constricting->increased pantoprazole to 40mg  bid at that time.   History of chronic bronchitis    hx of when she was a smoker: ? mild intermittent asthma?--much better since stopped smoking 2018.   History of fracture of phalanx of finger    left, 4th finger distal phalanx   History of non anemic vitamin B12 deficiency    s/p lap band surgery per pt report.   Hyperlipidemia    Hypertension  Iron deficiency anemia 01/2019   hemoccults ordered but never turned in (01/2019-01/2020). Iron supp started 02/2020. Hemoccults neg 07/2020, plan for iron infusion. 08/2020 EGD with SB bx normal, no source of IDA seen on colonoscopy. Iron infusion 09/06/2020   LAP-BAND surgery status    Pt getting lap band removed and is getting gastric sleave after.   Migraine syndrome    Ovarian cyst 10/2015   LEFT, 4 cm--noted on L spine MRI w/out contrast.  F/u u/s imaging showed simple cyst.    Spondylarthrosis    Tobacco dependence in remission    quit 2018    SURGICAL HISTORY: Past Surgical History:  Procedure Laterality Date   BACK SURGERY  2019   CARPAL TUNNEL RELEASE Left    CERVICAL CONE BIOPSY     COLONOSCOPY     2015 normal.  08/2020 (for IDA)->incomplete prep, no adenomas->rpt 5 yrs d/t incomplete prep.   ESOPHAGOGASTRODUODENOSCOPY N/A 04/27/2014   Procedure: ESOPHAGOGASTRODUODENOSCOPY (EGD);  Surgeon: Ovidio Kin, MD;  Location: [facility];  Service: General;  Laterality: N/A;   ESOPHAGOGASTRODUODENOSCOPY  08/29/2020   6 cm hiatal hernia noted, o/w normal, SB bx neg.  ?hernia contributing to pt's IDA?   GASTRIC BANDING PORT REVISION N/A 10/02/2014   Procedure: LAPROSCOPIC PLACEMENT OF LAP BAND PORT;  Surgeon: Glenna Fellows, MD;  Location: [facility];  Service: General;  Laterality: N/A;   LAPAROSCOPIC GASTRIC BANDING  03/2007   [facility]; Dr Jorja Loa Hipp   LAPAROSCOPIC REVISION OF GASTRIC BAND N/A 05/17/2012   Procedure: LAPAROSCOPIC REVISION OF SLIPPED GASTRIC BAND;  Surgeon: Mariella Saa, MD;  Location: [facility];  Service: General;  Laterality: N/A;   LAPAROSCOPY N/A 04/30/2014   Procedure: DIAGNOSTIC LAPAROSCOPY WITH REMOVAL OF INFECTED LAP BAND PORT WITH DEBRIDEMENT SUBCUTANEOUS ABSCESS;  Surgeon: Gaynelle Adu, MD;  Location: [facility];  Service: General;  Laterality: N/A;   VARICOSE VEIN SURGERY Bilateral 2009    SOCIAL HISTORY: Social History   Socioeconomic History   Marital status: Married    Spouse name: Not on file   Number of children: Not on file   Years of education: Not on file   Highest education level: Not on file  Occupational History   Not on file  Tobacco Use   Smoking status: Former    Current packs/day: 0.25    Average packs/day: 0.3 packs/day for 5.0 years (1.3 ttl pk-yrs)    Types: Cigarettes   Smokeless tobacco: Never  Vaping Use   Vaping status: Never Used  Substance and Sexual Activity   Alcohol  use: No   Drug use: No   Sexual activity: Yes    Birth control/protection: None  Other Topics Concern   Not on file  Social History Narrative   Married, no children.   Occup: Retail banker   Tobacco: 6 pack-yr hx--quit with wellbutrin.   No alcohol or drugs.   Social Determinants of Health   Financial Resource Strain: Not on file  Food Insecurity: No Food Insecurity (01/09/2022)   Hunger Vital Sign    Worried About Running Out of Food in the Last Year: Never true    Ran Out of Food in the Last Year: Never true  Transportation Needs: No Transportation Needs (01/09/2022)   PRAPARE - Administrator, Civil Service (Medical): No    Lack of Transportation (Non-Medical): No  Physical Activity: Not on file  Stress: Not on file  Social Connections: Unknown (05/19/2021)   [facility] from [facility]   Social Network    Social Network: Not on file  Intimate Partner Violence: Not At Risk (10/22/2022)   Humiliation, Afraid, Rape, and Kick questionnaire    Fear of Current or Ex-Partner: No    Emotionally Abused: No    Physically Abused: No    Sexually Abused: No    FAMILY HISTORY: Family History  Problem Relation Age of Onset   Alcohol abuse Mother    Drug abuse Mother    Arthritis Mother    Hyperlipidemia Mother    Heart disease Mother    Hypertension Mother    Non-Hodgkin's lymphoma Mother    Lung cancer Mother    Alcohol abuse Father    Arthritis Father    Hyperlipidemia Father    Heart disease Father    Stroke Father    Hypertension Father    Mental illness Father    Diabetes Father    Hyperlipidemia Sister    Hypertension Sister    Arthritis Maternal Grandmother    Cancer Maternal Grandmother    Hypertension Maternal Grandmother    Arthritis Maternal Grandfather    Arthritis Paternal Grandmother    Cancer Paternal Grandmother    Hyperlipidemia Paternal Grandmother    Heart disease Paternal Grandmother    Hypertension Paternal  Grandmother    Diabetes Paternal Grandmother    Arthritis Paternal Grandfather    Hyperlipidemia Paternal Grandfather    Heart disease Paternal Grandfather    Stroke Paternal Grandfather    Hypertension Paternal Grandfather     ALLERGIES:  is allergic to meperidine, vancomycin, demerol, microplegia msa-msg  [cardioplegia del nido formula], monosodium glutamate, and tape.  MEDICATIONS:  Current Outpatient Medications  Medication Sig Dispense Refill   albuterol (VENTOLIN HFA) 108 (90 Base) MCG/ACT inhaler INHALE 2 PUFFS INTO THE LUNGS EVERY 4 (FOUR) HOURS AS NEEDED FOR WHEEZING OR SHORTNESS OF BREATH. 8 g 0   apixaban (ELIQUIS) 5 MG TABS tablet Take 1 tablet (5 mg total) by mouth 2 (two) times daily. 60 tablet 4   baclofen (LIORESAL) 20 MG tablet Take 10-20 mg by mouth every 8 (eight) hours.     cetirizine (ZYRTEC) 10 MG tablet Take 20 mg by mouth daily as needed for allergies (allergies).     DULoxetine (CYMBALTA) 30 MG capsule Take 1 capsule (30 mg total) by mouth daily. 90 capsule 1   gabapentin (NEURONTIN) 800 MG tablet Take 800 mg by mouth in the morning, at noon, in the evening, and at bedtime.  6   lisinopril (ZESTRIL) 40 MG tablet Take 1 tablet (40 mg total) by mouth daily. 90 tablet 1   methylphenidate 27 MG PO CR tablet Take 1 tablet (27 mg total) by mouth every morning. 30 tablet 0   methylPREDNISolone (MEDROL DOSEPAK) 4 MG TBPK tablet Take 4 mg by mouth as directed.     Multiple Vitamin (MULITIVITAMIN WITH MINERALS) TABS Take 1 tablet by mouth daily.     oxyCODONE (ROXICODONE) 15 MG immediate release tablet Take 15 mg by mouth every 6 (six) hours as needed for pain.     oxyCODONE (ROXICODONE) 15 MG immediate release tablet Take 15 mg by mouth every 6 (six) hours. Do not fill until 06/24/2022 for 30 days.     Oxycodone HCl 10 MG TABS Take 10 mg by mouth every 6 (six) hours. Do not fill until 05/26/2022 for 30 days.     pantoprazole (PROTONIX) 40 MG tablet Take 1 tablet (40 mg  total) by mouth 2 (  two) times daily. 180 tablet 1   rizatriptan (MAXALT-MLT) 10 MG disintegrating tablet TAKE 1 TABLET BY MOUTH AS NEEDED FOR MIGRAINE. MAY REPEAT IN 2 HOURS IF NEEDED 10 tablet 0   SODIUM FLUORIDE, DENTAL RINSE, 0.2 % SOLN Take by mouth.     diclofenac (VOLTAREN) 75 MG EC tablet Take 1 tablet by mouth 2 (two) times daily.  (Patient not taking: Reported on 10/22/2022)     NARCAN 4 MG/0.1ML LIQD nasal spray kit  (Patient not taking: Reported on 10/22/2022)     No current facility-administered medications for this visit.    REVIEW OF SYSTEMS:   Constitutional: ( - ) fevers, ( - )  chills , ( - ) night sweats Eyes: ( - ) blurriness of vision, ( - ) double vision, ( - ) watery eyes Ears, nose, mouth, throat, and face: ( - ) mucositis, ( - ) sore throat Respiratory: ( - ) cough, ( - ) dyspnea, ( - ) wheezes Cardiovascular: ( - ) palpitation, ( - ) chest discomfort, ( - ) lower extremity swelling Gastrointestinal:  ( - ) nausea, ( - ) heartburn, ( - ) change in bowel habits Skin: ( - ) abnormal skin rashes Lymphatics: ( - ) new lymphadenopathy, ( - ) easy bruising Neurological: ( - ) numbness, ( - ) tingling, ( - ) new weaknesses Behavioral/Psych: ( - ) mood change, ( - ) new changes  All other systems were reviewed with the patient and are negative.  PHYSICAL EXAMINATION: ECOG PERFORMANCE STATUS: 2 - Symptomatic, <50% confined to bed  Vitals:   10/22/22 1323  BP: 135/88  Pulse: 66  Resp: 18  Temp: 98 F (36.7 C)  SpO2: 98%   Filed Weights   10/22/22 1323  Weight: 259 lb (117.5 kg)    GENERAL: well appearing female in NAD  SKIN: skin color, texture, turgor are normal, no rashes or significant lesions EYES: conjunctiva are pink and non-injected, sclera clear OROPHARYNX: no exudate, no erythema; lips, buccal mucosa, and tongue normal  NECK: supple, non-tender LYMPH:  no palpable lymphadenopathy in the cervical, axillary or supraclavicular lymph nodes.  LUNGS:  clear to auscultation and percussion with normal breathing effort HEART: regular rate & rhythm and no murmurs and no lower extremity edema ABDOMEN: soft, non-tender, non-distended, normal bowel sounds Musculoskeletal: no cyanosis of digits and no clubbing. Right leg circumference is about 1 inch greater than the left.  PSYCH: alert & oriented x 3, fluent speech. Tearful when discussing her pain NEURO: no focal motor/sensory deficits  LABORATORY DATA:  I have reviewed the data as listed    Latest Ref Rng & Units 09/16/2022   10:13 PM 06/06/2022    1:43 PM 11/01/2021    1:08 PM  CBC  WBC 4.0 - 10.5 K/uL 10.0  6.6  7.0   Hemoglobin 12.0 - 15.0 g/dL 14.7  82.9  56.2   Hematocrit 36.0 - 46.0 % 38.1  40.9  33.0   Platelets 150 - 400 K/uL 260  197.0  224        Latest Ref Rng & Units 09/16/2022   10:13 PM 06/06/2022    1:43 PM 07/22/2021    1:39 PM  CMP  Glucose 70 - 99 mg/dL 130  87  865   BUN 6 - 20 mg/dL 10  9  12    Creatinine 0.44 - 1.00 mg/dL 7.84  6.96  2.95   Sodium 135 - 145 mmol/L 137  140  139  Potassium 3.5 - 5.1 mmol/L 4.0  3.8  4.1   Chloride 98 - 111 mmol/L 101  101  104   CO2 22 - 32 mmol/L 29  31  29    Calcium 8.9 - 10.3 mg/dL 8.7  9.2  9.1   Total Protein 6.5 - 8.1 g/dL 7.2  7.1    Total Bilirubin 0.3 - 1.2 mg/dL 0.7  0.6    Alkaline Phos 38 - 126 U/L 55  64    AST 15 - 41 U/L 19  19    ALT 0 - 44 U/L 14  19      ASSESSMENT & PLAN Samantha Doyle is a 47 y.o. female who was referred to [place] for management of her bilateral DVT. She is currently on Eliquis.   A provoked venous thromboembolism (VTE) is one that has a clear inciting factor or event. Provoking factors include prolonged travel/immobility, surgery (particular abdominal or orthropedic), trauma,  and pregnancy/ estrogen containing birth control. After a detailed history and review of the records there is no clear provoking factor for this patient's VTE.  Patients with unprovoked VTEs have up to 25%  recurrence after 5 years and 36% at 10 years, with 4% of these clots being fatal ([phone]). Therefore the formal recommendation for unprovoked VTE's is lifelong anticoagulation, as the cause may not be transient or reversible. We recommend 6 months or full strength anticoagulation with a re-evaluation after that time.  The patient's will then have a choice of maintenance dose DOAC (preferred, recommended), 81mg  ASA PO daily (non-preferred), or no further anticoagulation (not recommended).   #Unprovoked DVT/Pulmonary Embolism --findings at this time are consistent with a unprovoked VTE. Given worsening symptoms will ensure that Eliquis is working with a repeat doppler scan. If blood clot has worsened I would recommend switching her to Lovenox vs Pradaxa --will order baseline CMP and CBC to assure labs are adequate for DOAC therapy. Given family history we also discussed hypercoagulable work up.  --recommend the patient continue eliquis 10mg  BID x 7 days followed by 5mg  BID indefinitely  --patient denies any bleeding, bruising, or dark stools on this medication. It is well tolerated. No difficulties accessing/affording the medication --RTC in 3 months' time with strict return precautions for overt signs of bleeding.     Orders Placed This Encounter  Procedures   US Venous Img Lower Bilateral (DVT)    Standing Status:   Future    Standing Expiration Date:   10/22/2023    Order Specific Question:   Reason for Exam (SYMPTOM  OR DIAGNOSIS REQUIRED)    Answer:   worsened pain and edema of right knee after DVT diagnosis    Order Specific Question:   Preferred imaging location?    Answer:   [facility]   Antithrombin III   Protein C activity   Protein C, total   Protein S activity   Protein S, total   Lupus anticoagulant panel   Beta-2-glycoprotein i abs, IgG/M/A   Homocysteine, serum   Factor 5 leiden   Prothrombin gene mutation   Cardiolipin antibodies, IgG, IgM, IgA   CBC     Standing Status:   Future    Number of Occurrences:   1    Standing Expiration Date:   10/22/2023   CMP (Cancer Center only)    Standing Status:   Future    Number of Occurrences:   1    Standing Expiration Date:   10/22/2023  All questions were answered. The patient knows to call the clinic with any problems, questions or concerns.  I have spent a total of 40 minutes minutes of face-to-face and non-face-to-face time, preparing to see the patient, obtaining and/or reviewing separately obtained history, performing a medically appropriate examination, counseling and educating the patient, ordering medications/tests/procedures, referring and communicating with other health care professionals, documenting clinical information in the electronic health record, independently interpreting results and communicating results to the patient, and care coordination.    Clent Jacks PA-C Department of Hematology/Oncology [facility]

## 2022-10-23 LAB — LUPUS ANTICOAGULANT PANEL
DRVVT: 48.5 s — ABNORMAL HIGH (ref 0.0–47.0)
PTT Lupus Anticoagulant: 31 s (ref 0.0–43.5)

## 2022-10-23 LAB — PROTEIN S ACTIVITY: Protein S Activity: 90 % (ref 63–140)

## 2022-10-23 LAB — HOMOCYSTEINE: Homocysteine: 14.2 umol/L (ref 0.0–14.5)

## 2022-10-23 LAB — DRVVT CONFIRM: dRVVT Confirm: 0.9 {ratio} (ref 0.8–1.2)

## 2022-10-23 LAB — PROTEIN S, TOTAL: Protein S Ag, Total: 92 % (ref 60–150)

## 2022-10-23 LAB — DRVVT MIX: dRVVT Mix: 41.9 s — ABNORMAL HIGH (ref 0.0–40.4)

## 2022-10-23 LAB — PROTEIN C ACTIVITY: Protein C Activity: 150 % (ref 73–180)

## 2022-10-25 LAB — BETA-2-GLYCOPROTEIN I ABS, IGG/M/A
Beta-2 Glyco I IgG: 80 GPI IgG units — ABNORMAL HIGH (ref 0–20)
Beta-2-Glycoprotein I IgA: 9 GPI IgA units (ref 0–25)
Beta-2-Glycoprotein I IgM: <9 GPI IgM units (ref 0–32)

## 2022-10-26 LAB — CARDIOLIPIN ANTIBODIES, IGG, IGM, IGA
Anticardiolipin IgA: <9 [APL'U]/mL (ref 0–11)
Anticardiolipin IgG: <9 [GPL'U]/mL (ref 0–14)
Anticardiolipin IgM: <9 [MPL'U]/mL (ref 0–12)

## 2022-10-26 LAB — PROTEIN C, TOTAL: Protein C, Total: 115 % (ref 60–150)

## 2022-10-27 LAB — FACTOR 5 LEIDEN

## 2022-10-28 LAB — PROTHROMBIN GENE MUTATION

## 2022-10-30 ENCOUNTER — Other Ambulatory Visit: Payer: Self-pay | Admitting: Family Medicine

## 2022-10-31 ENCOUNTER — Encounter: Payer: Self-pay | Admitting: Orthopedic Surgery

## 2022-11-03 DIAGNOSIS — G894 Chronic pain syndrome: Secondary | ICD-10-CM | POA: Diagnosis not present

## 2022-11-03 DIAGNOSIS — M5106 Intervertebral disc disorders with myelopathy, lumbar region: Secondary | ICD-10-CM | POA: Diagnosis not present

## 2022-11-03 DIAGNOSIS — M961 Postlaminectomy syndrome, not elsewhere classified: Secondary | ICD-10-CM | POA: Diagnosis not present

## 2022-11-03 DIAGNOSIS — M51362 Other intervertebral disc degeneration, lumbar region with discogenic back pain and lower extremity pain: Secondary | ICD-10-CM | POA: Diagnosis not present

## 2022-11-03 DIAGNOSIS — Z79891 Long term (current) use of opiate analgesic: Secondary | ICD-10-CM | POA: Diagnosis not present

## 2022-11-03 DIAGNOSIS — M791 Myalgia, unspecified site: Secondary | ICD-10-CM | POA: Diagnosis not present

## 2022-11-06 ENCOUNTER — Other Ambulatory Visit: Payer: BC Managed Care – PPO

## 2022-11-06 ENCOUNTER — Ambulatory Visit
Admission: RE | Admit: 2022-11-06 | Discharge: 2022-11-06 | Disposition: A | Discharge status: Home / Self Care | Payer: BC Managed Care – PPO | Source: Ambulatory Visit | Attending: Orthopedic Surgery | Admitting: Orthopedic Surgery

## 2022-11-06 DIAGNOSIS — S83281A Other tear of lateral meniscus, current injury, right knee, initial encounter: Secondary | ICD-10-CM

## 2022-11-06 DIAGNOSIS — R600 Localized edema: Secondary | ICD-10-CM | POA: Diagnosis not present

## 2022-11-06 DIAGNOSIS — M7732 Calcaneal spur, left foot: Secondary | ICD-10-CM | POA: Diagnosis not present

## 2022-11-06 DIAGNOSIS — M23341 Other meniscus derangements, anterior horn of lateral meniscus, right knee: Secondary | ICD-10-CM | POA: Diagnosis not present

## 2022-11-06 DIAGNOSIS — Z1810 Retained metal fragments, unspecified: Secondary | ICD-10-CM | POA: Diagnosis not present

## 2022-11-06 DIAGNOSIS — S82191A Other fracture of upper end of right tibia, initial encounter for closed fracture: Secondary | ICD-10-CM | POA: Diagnosis not present

## 2022-11-06 DIAGNOSIS — M19072 Primary osteoarthritis, left ankle and foot: Secondary | ICD-10-CM | POA: Diagnosis not present

## 2022-11-06 DIAGNOSIS — M25561 Pain in right knee: Secondary | ICD-10-CM | POA: Diagnosis not present

## 2022-11-07 ENCOUNTER — Encounter: Payer: Self-pay | Admitting: Family Medicine

## 2022-11-07 ENCOUNTER — Ambulatory Visit (INDEPENDENT_AMBULATORY_CARE_PROVIDER_SITE_OTHER): Payer: BC Managed Care – PPO | Admitting: Family Medicine

## 2022-11-07 VITALS — BP 123/85 | HR 81 | Wt 259.6 lb

## 2022-11-07 DIAGNOSIS — I82403 Acute embolism and thrombosis of unspecified deep veins of lower extremity, bilateral: Secondary | ICD-10-CM

## 2022-11-07 DIAGNOSIS — M25561 Pain in right knee: Secondary | ICD-10-CM | POA: Diagnosis not present

## 2022-11-07 NOTE — Progress Notes (Unsigned)
OFFICE VISIT  11/08/2022  CC:  Chief Complaint  Patient presents with   Follow-up    Pt states her knees have not improved at all; Went to hematology and said they saw no more clots but could see where they were. Pt wants a letter to keep her out of work because she physically cannot work anymore. Pt has forms with her.     Patient is a 47 y.o. female who presents for 40-month follow-up bilateral lower extremity DVTs.  INTERIM HX: Acute/subacute right knee lateral meniscus tear suspected by orthopedics back in late September.  She got an MRI yesterday.  No results available yet. Right knee pain severe still.  Now affecting the left knee due to secondary alteration of weightbearing and gait. Has tried to continue her work but has finally decided she can no longer bear it. Dr. Oneal Grout increased her oxycodone by 1 tablet/day to try to compensate for the increased pain but this has not helped.   Hematology oncology evaluation 10/22/2022 for her bilateral lower extremity DVTs, encounter reviewed today. Hypercoagulability workup was done, repeat Doppler scan, recommended 35-month of full-strength anticoagulation.  Then reevaluate for consideration of maintenance dose DOAC versus aspirin versus nothing.    Her repeat lower extremity venous Doppler on 10/22/2022 showed possible residual subocclusive thrombus in the right posterior tibial vein.  Negative for DVT in the left lower extremity. Hypercoagulability panel did show a few factors that put her at risk for recurrent thrombosis.  ROS as above, plus--> no fevers. Chronic diffuse low back pain.    No focal weakness, paresthesias, or tremors.    No n/v/d or abd pain.  No palpitations.     Past Medical History:  Diagnosis Date   Allergic rhinitis    Bilateral primary osteoarthritis of knee    severe   Carpal tunnel syndrome    right wrist, numb all the time   Cervical dysplasia    Chronic back pain    Degenerative lumbar spondylosis with  grade 2 spondylolisthesis L4 on L5 with bilater pars defects L4.    COVID-19 virus infection 02/06/2020   DDD (degenerative disc disease), lumbar    MRI 10/2015: grade I (7mm) anterolisthesis L4 on L5, w/ disc herniation with encroachment on spinal nerves at L4-S1 levels.   Depression    Edema of both lower extremities due to peripheral venous insufficiency    +varicose veins (hx of vein stripping 2009)   GERD (gastroesophageal reflux disease)    worsened 07/2018 likely associated with lap band being too constricting->increased pantoprazole to 40mg  bid at that time.   History of chronic bronchitis    hx of when she was a smoker: ? mild intermittent asthma?--much better since stopped smoking 2018.   History of fracture of phalanx of finger    left, 4th finger distal phalanx   History of non anemic vitamin B12 deficiency    s/p lap band surgery per pt report.   Hyperlipidemia    Hypertension    Iron deficiency anemia 01/2019   hemoccults ordered but never turned in (01/2019-01/2020). Iron supp started 02/2020. Hemoccults neg 07/2020, plan for iron infusion. 08/2020 EGD with SB bx normal, no source of IDA seen on colonoscopy. Iron infusion 09/06/2020   LAP-BAND surgery status    Pt getting lap band removed and is getting gastric sleave after.   Migraine syndrome    Ovarian cyst 10/2015   LEFT, 4 cm--noted on L spine MRI w/out contrast.  F/u u/s imaging showed simple  cyst.   Spondylarthrosis    Tobacco dependence in remission    quit 2018    Past Surgical History:  Procedure Laterality Date   BACK SURGERY  2019   CARPAL TUNNEL RELEASE Left    CERVICAL CONE BIOPSY     COLONOSCOPY     2015 normal.  08/2020 (for IDA)->incomplete prep, no adenomas->rpt 5 yrs d/t incomplete prep.   ESOPHAGOGASTRODUODENOSCOPY N/A 04/27/2014   Procedure: ESOPHAGOGASTRODUODENOSCOPY (EGD);  Surgeon: Ovidio Kin, MD;  Location: [facility];  Service: General;  Laterality: N/A;   ESOPHAGOGASTRODUODENOSCOPY   08/29/2020   6 cm hiatal hernia noted, o/w normal, SB bx neg.  ?hernia contributing to pt's IDA?   GASTRIC BANDING PORT REVISION N/A 10/02/2014   Procedure: LAPROSCOPIC PLACEMENT OF LAP BAND PORT;  Surgeon: Glenna Fellows, MD;  Location: [facility];  Service: General;  Laterality: N/A;   LAPAROSCOPIC GASTRIC BANDING  03/2007   [facility] - [facility]; Dr Jorja Loa Hipp   LAPAROSCOPIC REVISION OF GASTRIC BAND N/A 05/17/2012   Procedure: LAPAROSCOPIC REVISION OF SLIPPED GASTRIC BAND;  Surgeon: Mariella Saa, MD;  Location: [facility];  Service: General;  Laterality: N/A;   LAPAROSCOPY N/A 04/30/2014   Procedure: DIAGNOSTIC LAPAROSCOPY WITH REMOVAL OF INFECTED LAP BAND PORT WITH DEBRIDEMENT SUBCUTANEOUS ABSCESS;  Surgeon: Gaynelle Adu, MD;  Location: [facility];  Service: General;  Laterality: N/A;   VARICOSE VEIN SURGERY Bilateral 2009    Outpatient Medications Prior to Visit  Medication Sig Dispense Refill   albuterol (VENTOLIN HFA) 108 (90 Base) MCG/ACT inhaler INHALE 2 PUFFS INTO THE LUNGS EVERY 4 (FOUR) HOURS AS NEEDED FOR WHEEZING OR SHORTNESS OF BREATH. 8 g 0   apixaban (ELIQUIS) 5 MG TABS tablet Take 1 tablet (5 mg total) by mouth 2 (two) times daily. 60 tablet 4   baclofen (LIORESAL) 20 MG tablet Take 10-20 mg by mouth every 8 (eight) hours.     cetirizine (ZYRTEC) 10 MG tablet Take 20 mg by mouth daily as needed for allergies (allergies).     diclofenac (VOLTAREN) 75 MG EC tablet Take 1 tablet by mouth 2 (two) times daily.     DULoxetine (CYMBALTA) 30 MG capsule TAKE 1 CAPSULE BY MOUTH EVERY DAY 90 capsule 1   gabapentin (NEURONTIN) 800 MG tablet Take 800 mg by mouth in the morning, at noon, in the evening, and at bedtime.  6   lisinopril (ZESTRIL) 40 MG tablet Take 1 tablet (40 mg total) by mouth daily. 90 tablet 1   methylphenidate 27 MG PO CR tablet Take 1 tablet (27 mg total) by mouth every morning. 30 tablet 0   methylPREDNISolone (MEDROL DOSEPAK) 4 MG TBPK tablet Take 4 mg by  mouth as directed.     Multiple Vitamin (MULITIVITAMIN WITH MINERALS) TABS Take 1 tablet by mouth daily.     NARCAN 4 MG/0.1ML LIQD nasal spray kit      oxyCODONE (ROXICODONE) 15 MG immediate release tablet Take 15 mg by mouth every 6 (six) hours as needed for pain.     oxyCODONE (ROXICODONE) 15 MG immediate release tablet Take 15 mg by mouth every 6 (six) hours. Do not fill until 06/24/2022 for 30 days.     Oxycodone HCl 10 MG TABS Take 10 mg by mouth every 6 (six) hours. Do not fill until 05/26/2022 for 30 days.     pantoprazole (PROTONIX) 40 MG tablet Take 1 tablet (40 mg total) by mouth 2 (two) times daily. 180 tablet 1   rizatriptan (MAXALT-MLT)  10 MG disintegrating tablet TAKE 1 TABLET BY MOUTH AS NEEDED FOR MIGRAINE. MAY REPEAT IN 2 HOURS IF NEEDED 10 tablet 0   SODIUM FLUORIDE, DENTAL RINSE, 0.2 % SOLN Take by mouth.     No facility-administered medications prior to visit.    Allergies  Allergen Reactions   Meperidine Hives and Nausea And Vomiting   Vancomycin Anaphylaxis and Itching    Face/back warm and red.  Pt lips and tongue swelled, hives   Demerol Hives and Nausea And Vomiting   Microplegia Msa-Msg  [Cardioplegia [place] Nido Formula] Other (See Comments)    Other reaction(s): headache   Monosodium Glutamate Nausea And Vomiting and Other (See Comments)    migraines    Tape Rash    Plastic tape    Review of Systems As per HPI  PE:    11/07/2022    1:08 PM 10/22/2022    1:23 PM 09/23/2022   11:19 AM  Vitals with BMI  Height  5\' 3"  5\' 3"   Weight 259 lbs 10 oz 259 lbs   BMI 46 45.89   Systolic 123 135 409  Diastolic 85 88 70  Pulse 81 66 75   Physical Exam  Gen: Alert, well appearing.  Patient is oriented to person, place, time, and situation. AFFECT: pleasant, lucid thought and speech. No further exam today  LABS:  Last CBC Lab Results  Component Value Date   WBC 8.1 10/22/2022   HGB 11.6 (L) 10/22/2022   HCT 36.9 10/22/2022   MCV 85.2 10/22/2022   MCH  26.8 10/22/2022   RDW 14.1 10/22/2022   PLT 220 10/22/2022   Last metabolic panel Lab Results  Component Value Date   GLUCOSE 93 10/22/2022   NA 140 10/22/2022   K 3.6 10/22/2022   CL 101 10/22/2022   CO2 32 10/22/2022   BUN 11 10/22/2022   CREATININE 0.71 10/22/2022   GFRNONAA >60 10/22/2022   CALCIUM 9.4 10/22/2022   PROT 7.1 10/22/2022   ALBUMIN 3.8 10/22/2022   BILITOT 0.3 10/22/2022   ALKPHOS 57 10/22/2022   AST 14 (L) 10/22/2022   ALT 12 10/22/2022   ANIONGAP 7 10/22/2022    IMPRESSION AND PLAN:  #1 acute/subacute right knee pain. Debilitating: unresponsive to relative rest, ice, heat, elevation, knee aspiration and steroid injection, and recent increase in opioid pain medication. She is established with orthopedics. MRI right knee results pending.   In the event that MRI result is not very revealing of any internal derangement then we will proceed with general workup for inflammatory joint disease. No labs needed today  She is unable to work due to her pain. FMLA forms to be completed today.  #2 bilateral lower extremity DVTs. Unprovoked. She is established with hematology/oncology. Plan is for DOAC lifelong.  An After Visit Summary was printed and given to the patient.  FOLLOW UP: Return for Keep appointment scheduled for 12/12/2022.  Signed:  Santiago Bumpers, MD           11/08/2022

## 2022-11-11 ENCOUNTER — Encounter: Payer: Self-pay | Admitting: Family Medicine

## 2022-11-14 ENCOUNTER — Telehealth (INDEPENDENT_AMBULATORY_CARE_PROVIDER_SITE_OTHER): Payer: BC Managed Care – PPO | Admitting: Family Medicine

## 2022-11-14 ENCOUNTER — Encounter: Payer: Self-pay | Admitting: Family Medicine

## 2022-11-14 DIAGNOSIS — R600 Localized edema: Secondary | ICD-10-CM | POA: Diagnosis not present

## 2022-11-14 MED ORDER — FUROSEMIDE 20 MG PO TABS: 20.0000 mg | ORAL_TABLET | Freq: Every day | ORAL | 0 refills | Status: DC

## 2022-11-14 NOTE — Progress Notes (Signed)
Virtual Visit via Video Note  I connected with Samantha Doyle  on 11/14/22 at  3:00 PM EST by a video enabled telemedicine application and verified that I am speaking with the correct person using two identifiers.  Location patient: [place] Location provider:work or home office Persons participating in the virtual visit: patient, provider  I discussed the limitations and requested verbal permission for telemedicine visit. The patient expressed understanding and agreed to proceed.  CC: LE swelling  A/P as of last visit 7 days ago: "#1 acute/subacute right knee pain. Debilitating: unresponsive to relative rest, ice, heat, elevation, knee aspiration and steroid injection, and recent increase in opioid pain medication. She is established with orthopedics. MRI right knee results pending.   In the event that MRI result is not very revealing of any internal derangement then we will proceed with general workup for inflammatory joint disease. No labs needed today   She is unable to work due to her pain. FMLA forms to be completed today.   #2 bilateral lower extremity DVTs. Unprovoked. She is established with hematology/oncology. Plan is for DOAC lifelong."  INTERIM HX: Bilateral lower extremity swelling from the knees down to the feet is worse in the last day or 2. The worse part seems to the ankles and feet.  She does wear compression stockings some but they cause a lot of discomfort lately.  She has tried to minimize sodium. Diclofenac 75 mg does help significantly with her knees and lower legs pain. She has only used this on 1 or 2 days and she is aware of the potential interaction with Eliquis.  MRI R knee was done 11/06/22 but no result yet.   ROS: See pertinent positives and negatives per HPI.  Past Medical History:  Diagnosis Date   Allergic rhinitis    Bilateral primary osteoarthritis of knee    severe   Carpal tunnel syndrome    right wrist, numb all the time   Cervical dysplasia     Chronic back pain    Degenerative lumbar spondylosis with grade 2 spondylolisthesis L4 on L5 with bilater pars defects L4.    COVID-19 virus infection 02/06/2020   DDD (degenerative disc disease), lumbar    MRI 10/2015: grade I (7mm) anterolisthesis L4 on L5, w/ disc herniation with encroachment on spinal nerves at L4-S1 levels.   Depression    Edema of both lower extremities due to peripheral venous insufficiency    +varicose veins (hx of vein stripping 2009)   GERD (gastroesophageal reflux disease)    worsened 07/2018 likely associated with lap band being too constricting->increased pantoprazole to 40mg  bid at that time.   History of chronic bronchitis    hx of when she was a smoker: ? mild intermittent asthma?--much better since stopped smoking 2018.   History of fracture of phalanx of finger    left, 4th finger distal phalanx   History of non anemic vitamin B12 deficiency    s/p lap band surgery per pt report.   Hyperlipidemia    Hypertension    Iron deficiency anemia 01/2019   hemoccults ordered but never turned in (01/2019-01/2020). Iron supp started 02/2020. Hemoccults neg 07/2020, plan for iron infusion. 08/2020 EGD with SB bx normal, no source of IDA seen on colonoscopy. Iron infusion 09/06/2020   LAP-BAND surgery status    Pt getting lap band removed and is getting gastric sleave after.   Migraine syndrome    Ovarian cyst 10/2015   LEFT, 4 cm--noted on L spine MRI w/out  contrast.  F/u u/s imaging showed simple cyst.   Spondylarthrosis    Tobacco dependence in remission    quit 2018    Past Surgical History:  Procedure Laterality Date   BACK SURGERY  2019   CARPAL TUNNEL RELEASE Left    CERVICAL CONE BIOPSY     COLONOSCOPY     2015 normal.  08/2020 (for IDA)->incomplete prep, no adenomas->rpt 5 yrs d/t incomplete prep.   ESOPHAGOGASTRODUODENOSCOPY N/A 04/27/2014   Procedure: ESOPHAGOGASTRODUODENOSCOPY (EGD);  Surgeon: Ovidio Kin, MD;  Location: [facility];  Service:  General;  Laterality: N/A;   ESOPHAGOGASTRODUODENOSCOPY  08/29/2020   6 cm hiatal hernia noted, o/w normal, SB bx neg.  ?hernia contributing to pt's IDA?   GASTRIC BANDING PORT REVISION N/A 10/02/2014   Procedure: LAPROSCOPIC PLACEMENT OF LAP BAND PORT;  Surgeon: Glenna Fellows, MD;  Location: [facility];  Service: General;  Laterality: N/A;   LAPAROSCOPIC GASTRIC BANDING  03/2007   [facility] - [facility]; Dr Jorja Loa Hipp   LAPAROSCOPIC REVISION OF GASTRIC BAND N/A 05/17/2012   Procedure: LAPAROSCOPIC REVISION OF SLIPPED GASTRIC BAND;  Surgeon: Mariella Saa, MD;  Location: [facility];  Service: General;  Laterality: N/A;   LAPAROSCOPY N/A 04/30/2014   Procedure: DIAGNOSTIC LAPAROSCOPY WITH REMOVAL OF INFECTED LAP BAND PORT WITH DEBRIDEMENT SUBCUTANEOUS ABSCESS;  Surgeon: Gaynelle Adu, MD;  Location: [facility];  Service: General;  Laterality: N/A;   VARICOSE VEIN SURGERY Bilateral 2009     Current Outpatient Medications:    albuterol (VENTOLIN HFA) 108 (90 Base) MCG/ACT inhaler, INHALE 2 PUFFS INTO THE LUNGS EVERY 4 (FOUR) HOURS AS NEEDED FOR WHEEZING OR SHORTNESS OF BREATH., Disp: 8 g, Rfl: 0   apixaban (ELIQUIS) 5 MG TABS tablet, Take 1 tablet (5 mg total) by mouth 2 (two) times daily., Disp: 60 tablet, Rfl: 4   baclofen (LIORESAL) 20 MG tablet, Take 10-20 mg by mouth every 8 (eight) hours., Disp: , Rfl:    cetirizine (ZYRTEC) 10 MG tablet, Take 20 mg by mouth daily as needed for allergies (allergies)., Disp: , Rfl:    diclofenac (VOLTAREN) 75 MG EC tablet, Take 1 tablet by mouth 2 (two) times daily., Disp: , Rfl:    DULoxetine (CYMBALTA) 30 MG capsule, TAKE 1 CAPSULE BY MOUTH EVERY DAY, Disp: 90 capsule, Rfl: 1   gabapentin (NEURONTIN) 800 MG tablet, Take 800 mg by mouth in the morning, at noon, in the evening, and at bedtime., Disp: , Rfl: 6   lisinopril (ZESTRIL) 40 MG tablet, Take 1 tablet (40 mg total) by mouth daily., Disp: 90 tablet, Rfl: 1   methylphenidate 27 MG PO CR  tablet, Take 1 tablet (27 mg total) by mouth every morning., Disp: 30 tablet, Rfl: 0   methylPREDNISolone (MEDROL DOSEPAK) 4 MG TBPK tablet, Take 4 mg by mouth as directed., Disp: , Rfl:    Multiple Vitamin (MULITIVITAMIN WITH MINERALS) TABS, Take 1 tablet by mouth daily., Disp: , Rfl:    NARCAN 4 MG/0.1ML LIQD nasal spray kit, , Disp: , Rfl:    oxyCODONE (ROXICODONE) 15 MG immediate release tablet, Take 15 mg by mouth every 6 (six) hours as needed for pain., Disp: , Rfl:    oxyCODONE (ROXICODONE) 15 MG immediate release tablet, Take 15 mg by mouth every 6 (six) hours. Do not fill until 06/24/2022 for 30 days., Disp: , Rfl:    Oxycodone HCl 10 MG TABS, Take 10 mg by mouth every 6 (six) hours. Do not fill until 05/26/2022 for  30 days., Disp: , Rfl:    pantoprazole (PROTONIX) 40 MG tablet, Take 1 tablet (40 mg total) by mouth 2 (two) times daily., Disp: 180 tablet, Rfl: 1   rizatriptan (MAXALT-MLT) 10 MG disintegrating tablet, TAKE 1 TABLET BY MOUTH AS NEEDED FOR MIGRAINE. MAY REPEAT IN 2 HOURS IF NEEDED, Disp: 10 tablet, Rfl: 0   SODIUM FLUORIDE, DENTAL RINSE, 0.2 % SOLN, Take by mouth., Disp: , Rfl:   EXAM:  VITALS per patient if applicable:     11/07/2022    1:08 PM 10/22/2022    1:23 PM 09/23/2022   11:19 AM  Vitals with BMI  Height  5\' 3"  5\' 3"   Weight 259 lbs 10 oz 259 lbs   BMI 46 45.89   Systolic 123 135 536  Diastolic 85 88 70  Pulse 81 66 75   Knees appear swollen.  Lower legs are diffusely swollen down and through the ankles and the tops of the feet.  No erythema.  GENERAL: alert, oriented, appears well and in no acute distress  HEENT: atraumatic, conjunttiva clear, no obvious abnormalities on inspection of external nose and ears  NECK: normal movements of the head and neck  LUNGS: on inspection no signs of respiratory distress, breathing rate appears normal, no obvious gross SOB, gasping or wheezing  CV: no obvious cyanosis  MS: moves all visible extremities without  noticeable abnormality  PSYCH/NEURO: pleasant and cooperative, no obvious depression or anxiety, speech and thought processing grossly intact  LABS: none today    Chemistry      Component Value Date/Time   NA 140 10/22/2022 1403   NA 140 08/19/2020 0000   K 3.6 10/22/2022 1403   CL 101 10/22/2022 1403   CO2 32 10/22/2022 1403   BUN 11 10/22/2022 1403   BUN 13 08/19/2020 0000   CREATININE 0.71 10/22/2022 1403   CREATININE 0.61 08/07/2017 1508   GLU 83 08/19/2020 0000      Component Value Date/Time   CALCIUM 9.4 10/22/2022 1403   ALKPHOS 57 10/22/2022 1403   AST 14 (L) 10/22/2022 1403   ALT 12 10/22/2022 1403   BILITOT 0.3 10/22/2022 1403     Lab Results  Component Value Date   WBC 8.1 10/22/2022   HGB 11.6 (L) 10/22/2022   HCT 36.9 10/22/2022   MCV 85.2 10/22/2022   PLT 220 10/22/2022   ASSESSMENT AND PLAN:  Discussed the following assessment and plan:  #1 bilateral lower extremity edema. Some of this may be from severe bilateral knee osteoarthritis. She definitely has some venous insufficiency from her recent history of DVTs. Start Lasix 20 mg a day x 7 days.  Lab visit for basic metabolic panel in 1 week. Continue sodium limitation, compression stockings, and elevation.  #2 bilateral knee osteoarthritis. Worse on the right.  Saw orthopedist 09/26/2022 and they feel like she probably has lateral meniscus tear. She had an MRI of right knee on 11/06/2022, currently awaiting radiologist interpretation. She we will use the diclofenac 75 mg sparingly.  She is well aware of the potential for increased risk of bleeding when she uses this medication while on Eliquis.  I discussed the assessment and treatment plan with the patient. The patient was provided an opportunity to ask questions and all were answered. The patient agreed with the plan and demonstrated an understanding of the instructions.   F/u: prn  Signed:  Santiago Bumpers, MD           11/14/2022

## 2022-11-18 DIAGNOSIS — G894 Chronic pain syndrome: Secondary | ICD-10-CM | POA: Diagnosis not present

## 2022-11-18 DIAGNOSIS — Z79891 Long term (current) use of opiate analgesic: Secondary | ICD-10-CM | POA: Diagnosis not present

## 2022-11-18 DIAGNOSIS — Z5181 Encounter for therapeutic drug level monitoring: Secondary | ICD-10-CM | POA: Diagnosis not present

## 2022-11-19 DIAGNOSIS — S83281A Other tear of lateral meniscus, current injury, right knee, initial encounter: Secondary | ICD-10-CM | POA: Diagnosis not present

## 2022-11-19 DIAGNOSIS — S82131A Displaced fracture of medial condyle of right tibia, initial encounter for closed fracture: Secondary | ICD-10-CM | POA: Diagnosis not present

## 2022-11-19 DIAGNOSIS — M170 Bilateral primary osteoarthritis of knee: Secondary | ICD-10-CM | POA: Diagnosis not present

## 2022-11-27 DIAGNOSIS — G894 Chronic pain syndrome: Secondary | ICD-10-CM | POA: Diagnosis not present

## 2022-11-27 DIAGNOSIS — Z9884 Bariatric surgery status: Secondary | ICD-10-CM | POA: Diagnosis not present

## 2022-11-27 DIAGNOSIS — I10 Essential (primary) hypertension: Secondary | ICD-10-CM | POA: Diagnosis not present

## 2022-12-01 ENCOUNTER — Other Ambulatory Visit: Payer: Self-pay | Admitting: General Surgery

## 2022-12-01 DIAGNOSIS — Z9884 Bariatric surgery status: Secondary | ICD-10-CM

## 2022-12-12 ENCOUNTER — Ambulatory Visit (INDEPENDENT_AMBULATORY_CARE_PROVIDER_SITE_OTHER): Payer: BC Managed Care – PPO | Admitting: Family Medicine

## 2022-12-12 ENCOUNTER — Encounter: Payer: Self-pay | Admitting: Family Medicine

## 2022-12-12 ENCOUNTER — Telehealth: Payer: Self-pay

## 2022-12-12 VITALS — BP 125/77 | HR 68 | Wt 259.2 lb

## 2022-12-12 DIAGNOSIS — E669 Obesity, unspecified: Secondary | ICD-10-CM

## 2022-12-12 DIAGNOSIS — F411 Generalized anxiety disorder: Secondary | ICD-10-CM

## 2022-12-12 DIAGNOSIS — I10 Essential (primary) hypertension: Secondary | ICD-10-CM | POA: Diagnosis not present

## 2022-12-12 DIAGNOSIS — I82403 Acute embolism and thrombosis of unspecified deep veins of lower extremity, bilateral: Secondary | ICD-10-CM | POA: Diagnosis not present

## 2022-12-12 DIAGNOSIS — F988 Other specified behavioral and emotional disorders with onset usually occurring in childhood and adolescence: Secondary | ICD-10-CM

## 2022-12-12 NOTE — Progress Notes (Signed)
OFFICE VISIT  12/12/2022  CC:  Chief Complaint  Patient presents with   Medical Management of Chronic Issues    Patient is a 47 y.o. female who presents accompanied by her dad for 69-month follow-up hypertension, adult ADD, and GAD.  INTERIM HX: Not needing methylphenidate lately b/c out of work d/t knee.  Anxiety level pretty stable, doing well in light of all complicated medical issues. Not requiring any lasix anymore.  R TKA still planned but interrupted d/t recent DVTs and need for eliquis.  Has seen surgeon for discussion of possible revision of gastric band.  PMP AWARE reviewed today: most recent rx for methylphenidate ER 27 mg was filled 09/11/2022, # 30, rx by me. No red flags.  ROS as above, plus--> no fevers, no CP, no SOB, no wheezing, no cough, no dizziness, no HAs, no rashes, no melena/hematochezia.   No recent changes in lower legs. No n/v/d or abd pain.  No palpitations.    Past Medical History:  Diagnosis Date   Allergic rhinitis    Bilateral primary osteoarthritis of knee    severe   Carpal tunnel syndrome    right wrist, numb all the time   Cervical dysplasia    Chronic back pain    Degenerative lumbar spondylosis with grade 2 spondylolisthesis L4 on L5 with bilater pars defects L4.    COVID-19 virus infection 02/06/2020   DDD (degenerative disc disease), lumbar    MRI 10/2015: grade I (7mm) anterolisthesis L4 on L5, w/ disc herniation with encroachment on spinal nerves at L4-S1 levels.   Depression    DVT, lower extremity, recurrent (HCC)    09/2022, bilat->eliquis indef   Edema of both lower extremities due to peripheral venous insufficiency    +varicose veins (hx of vein stripping 2009)   GERD (gastroesophageal reflux disease)    worsened 07/2018 likely associated with lap band being too constricting->increased pantoprazole to 40mg  bid at that time.   History of chronic bronchitis    hx of when she was a smoker: ? mild intermittent asthma?--much better  since stopped smoking 2018.   History of fracture of phalanx of finger    left, 4th finger distal phalanx   History of non anemic vitamin B12 deficiency    s/p lap band surgery per pt report.   Hyperlipidemia    Hypertension    Iron deficiency anemia 01/2019   hemoccults ordered but never turned in (01/2019-01/2020). Iron supp started 02/2020. Hemoccults neg 07/2020, plan for iron infusion. 08/2020 EGD with SB bx normal, no source of IDA seen on colonoscopy. Iron infusion 09/06/2020   LAP-BAND surgery status    Pt getting lap band removed and is getting gastric sleave after.   Migraine syndrome    Ovarian cyst 10/2015   LEFT, 4 cm--noted on L spine MRI w/out contrast.  F/u u/s imaging showed simple cyst.   Spondylarthrosis    Tobacco dependence in remission    quit 2018    Past Surgical History:  Procedure Laterality Date   BACK SURGERY  2019   CARPAL TUNNEL RELEASE Left    CERVICAL CONE BIOPSY     COLONOSCOPY     2015 normal.  08/2020 (for IDA)->incomplete prep, no adenomas->rpt 5 yrs d/t incomplete prep.   ESOPHAGOGASTRODUODENOSCOPY N/A 04/27/2014   Procedure: ESOPHAGOGASTRODUODENOSCOPY (EGD);  Surgeon: Ovidio Kin, MD;  Location: [facility];  Service: General;  Laterality: N/A;   ESOPHAGOGASTRODUODENOSCOPY  08/29/2020   6 cm hiatal hernia noted, o/w normal, SB bx neg.  ?  hernia contributing to pt's IDA?   GASTRIC BANDING PORT REVISION N/A 10/02/2014   Procedure: LAPROSCOPIC PLACEMENT OF LAP BAND PORT;  Surgeon: Glenna Fellows, MD;  Location: [facility];  Service: General;  Laterality: N/A;   LAPAROSCOPIC GASTRIC BANDING  03/2007   [facility]; Dr Jorja Loa Hipp   LAPAROSCOPIC REVISION OF GASTRIC BAND N/A 05/17/2012   Procedure: LAPAROSCOPIC REVISION OF SLIPPED GASTRIC BAND;  Surgeon: Mariella Saa, MD;  Location: [facility];  Service: General;  Laterality: N/A;   LAPAROSCOPY N/A 04/30/2014   Procedure: DIAGNOSTIC LAPAROSCOPY WITH REMOVAL OF INFECTED LAP BAND  PORT WITH DEBRIDEMENT SUBCUTANEOUS ABSCESS;  Surgeon: Gaynelle Adu, MD;  Location: [facility];  Service: General;  Laterality: N/A;   VARICOSE VEIN SURGERY Bilateral 2009    Outpatient Medications Prior to Visit  Medication Sig Dispense Refill   albuterol (VENTOLIN HFA) 108 (90 Base) MCG/ACT inhaler INHALE 2 PUFFS INTO THE LUNGS EVERY 4 (FOUR) HOURS AS NEEDED FOR WHEEZING OR SHORTNESS OF BREATH. 8 g 0   alendronate (FOSAMAX) 10 MG tablet Take 10 mg by mouth daily before breakfast. Take with a full glass of water on an empty stomach.     apixaban (ELIQUIS) 5 MG TABS tablet Take 1 tablet (5 mg total) by mouth 2 (two) times daily. 60 tablet 4   baclofen (LIORESAL) 20 MG tablet Take 10-20 mg by mouth every 8 (eight) hours.     calcitonin, salmon, (MIACALCIN/FORTICAL) 200 UNIT/ACT nasal spray Place 1 spray into alternate nostrils daily.     cetirizine (ZYRTEC) 10 MG tablet Take 20 mg by mouth daily as needed for allergies (allergies).     diclofenac (VOLTAREN) 75 MG EC tablet Take 1 tablet by mouth 2 (two) times daily.     DULoxetine (CYMBALTA) 30 MG capsule TAKE 1 CAPSULE BY MOUTH EVERY DAY 90 capsule 1   gabapentin (NEURONTIN) 800 MG tablet Take 800 mg by mouth in the morning, at noon, in the evening, and at bedtime.  6   lisinopril (ZESTRIL) 40 MG tablet Take 1 tablet (40 mg total) by mouth daily. 90 tablet 1   Multiple Vitamin (MULITIVITAMIN WITH MINERALS) TABS Take 1 tablet by mouth daily.     NARCAN 4 MG/0.1ML LIQD nasal spray kit      Oxycodone HCl 10 MG TABS Take 10 mg by mouth every 6 (six) hours. Do not fill until 05/26/2022 for 30 days.     pantoprazole (PROTONIX) 40 MG tablet Take 1 tablet (40 mg total) by mouth 2 (two) times daily. 180 tablet 1   rizatriptan (MAXALT-MLT) 10 MG disintegrating tablet TAKE 1 TABLET BY MOUTH AS NEEDED FOR MIGRAINE. MAY REPEAT IN 2 HOURS IF NEEDED 10 tablet 0   SODIUM FLUORIDE, DENTAL RINSE, 0.2 % SOLN Take by mouth.     furosemide (LASIX) 20 MG tablet Take 1  tablet (20 mg total) by mouth daily. (Patient not taking: Reported on 12/12/2022) 10 tablet 0   methylphenidate 27 MG PO CR tablet Take 1 tablet (27 mg total) by mouth every morning. (Patient not taking: Reported on 12/12/2022) 30 tablet 0   methylPREDNISolone (MEDROL DOSEPAK) 4 MG TBPK tablet Take 4 mg by mouth as directed.     oxyCODONE (ROXICODONE) 15 MG immediate release tablet Take 15 mg by mouth every 6 (six) hours as needed for pain.     oxyCODONE (ROXICODONE) 15 MG immediate release tablet Take 15 mg by mouth every 6 (six) hours. Do not fill until 06/24/2022 for  30 days.     No facility-administered medications prior to visit.    Allergies  Allergen Reactions   Meperidine Hives and Nausea And Vomiting   Vancomycin Anaphylaxis and Itching    Face/back warm and red.  Pt lips and tongue swelled, hives   Demerol Hives and Nausea And Vomiting   Microplegia Msa-Msg  [Cardioplegia Del Nido Formula] Other (See Comments)    Other reaction(s): headache   Monosodium Glutamate Nausea And Vomiting and Other (See Comments)    migraines    Tape Rash    Plastic tape    Review of Systems As per HPI  PE:    12/12/2022    1:06 PM 11/07/2022    1:08 PM 10/22/2022    1:23 PM  Vitals with BMI  Height   5\' 3"   Weight 259 lbs 3 oz 259 lbs 10 oz 259 lbs  BMI  46 45.89  Systolic 125 123 409  Diastolic 77 85 88  Pulse 68 81 66     Physical Exam  Gen: Alert, well appearing.  Patient is oriented to person, place, time, and situation. AFFECT: pleasant, lucid thought and speech. No further exam today.  LABS:  Last CBC Lab Results  Component Value Date   WBC 8.1 10/22/2022   HGB 11.6 (L) 10/22/2022   HCT 36.9 10/22/2022   MCV 85.2 10/22/2022   MCH 26.8 10/22/2022   RDW 14.1 10/22/2022   PLT 220 10/22/2022   Lab Results  Component Value Date   IRON 49 06/06/2022   TIBC 333 06/06/2022   FERRITIN 21 06/06/2022   Last metabolic panel Lab Results  Component Value Date   GLUCOSE  93 10/22/2022   NA 140 10/22/2022   K 3.6 10/22/2022   CL 101 10/22/2022   CO2 32 10/22/2022   BUN 11 10/22/2022   CREATININE 0.71 10/22/2022   GFRNONAA >60 10/22/2022   CALCIUM 9.4 10/22/2022   PROT 7.1 10/22/2022   ALBUMIN 3.8 10/22/2022   BILITOT 0.3 10/22/2022   ALKPHOS 57 10/22/2022   AST 14 (L) 10/22/2022   ALT 12 10/22/2022   ANIONGAP 7 10/22/2022   Last lipids Lab Results  Component Value Date   CHOL 212 (H) 06/06/2022   HDL 58.00 06/06/2022   LDLCALC 135 (H) 06/06/2022   TRIG 96.0 06/06/2022   CHOLHDL 4 06/06/2022   Last thyroid functions Lab Results  Component Value Date   TSH 1.60 06/06/2022   Last vitamin B12 and Folate Lab Results  Component Value Date   VITAMINB12 828 03/21/2016   IMPRESSION AND PLAN:  1) HTN, well controlled on lisinopril 10mg  every day. Lytes/cr normal 10/22/22  2) GAD, doing well on duloxetine 30 every day.  3) Adult ADD, not requiring methylphenidate lately b/c not working. No rx needed today.  4) Bilat DVT 09/12/22, +abnl hypercoag panel at hematologist's--->eliquis indefinitely. Pt's legs currently feel very well.  5) Obesity, pt in need of lap band revision but this will have to wait until she has been on eliquis at least 6 months. In the meantime will refer to [facility].  No labs needed today.  Spent 32 min with pt today reviewing HPI, reviewing relevant past history, doing exam, reviewing and discussing lab and imaging data, and formulating plans.  An After Visit Summary was printed and given to the patient.  FOLLOW UP: 6 mo cpe Next CPE 05/2023 Signed:  Santiago Bumpers, MD  12/12/2022 ° ° °

## 2022-12-29 ENCOUNTER — Telehealth: Payer: Self-pay | Admitting: Family Medicine

## 2022-12-29 NOTE — Telephone Encounter (Signed)
Pt was on a water pill and she needs a refill on the medication sent to [facility]

## 2022-12-30 MED ORDER — FUROSEMIDE 20 MG PO TABS: ORAL_TABLET | ORAL | 0 refills | Status: DC

## 2022-12-30 NOTE — Telephone Encounter (Signed)
Ok, lasix sent

## 2023-01-17 ENCOUNTER — Other Ambulatory Visit: Payer: Self-pay | Admitting: Family Medicine

## 2023-01-19 DIAGNOSIS — M4726 Other spondylosis with radiculopathy, lumbar region: Secondary | ICD-10-CM | POA: Diagnosis not present

## 2023-01-19 DIAGNOSIS — M5106 Intervertebral disc disorders with myelopathy, lumbar region: Secondary | ICD-10-CM | POA: Diagnosis not present

## 2023-01-19 DIAGNOSIS — G894 Chronic pain syndrome: Secondary | ICD-10-CM | POA: Diagnosis not present

## 2023-01-19 DIAGNOSIS — M51362 Other intervertebral disc degeneration, lumbar region with discogenic back pain and lower extremity pain: Secondary | ICD-10-CM | POA: Diagnosis not present

## 2023-01-19 DIAGNOSIS — M791 Myalgia, unspecified site: Secondary | ICD-10-CM | POA: Diagnosis not present

## 2023-01-19 DIAGNOSIS — Z79891 Long term (current) use of opiate analgesic: Secondary | ICD-10-CM | POA: Diagnosis not present

## 2023-01-20 DIAGNOSIS — S82131A Displaced fracture of medial condyle of right tibia, initial encounter for closed fracture: Secondary | ICD-10-CM | POA: Diagnosis not present

## 2023-01-20 DIAGNOSIS — M170 Bilateral primary osteoarthritis of knee: Secondary | ICD-10-CM | POA: Diagnosis not present

## 2023-01-20 DIAGNOSIS — S83281A Other tear of lateral meniscus, current injury, right knee, initial encounter: Secondary | ICD-10-CM | POA: Diagnosis not present

## 2023-01-28 ENCOUNTER — Encounter: Payer: Self-pay | Admitting: Family Medicine

## 2023-01-28 ENCOUNTER — Ambulatory Visit: Payer: BC Managed Care – PPO | Admitting: Family Medicine

## 2023-01-28 ENCOUNTER — Other Ambulatory Visit: Payer: Self-pay | Admitting: Family Medicine

## 2023-01-28 VITALS — BP 124/68 | HR 69 | Wt 261.0 lb

## 2023-01-28 DIAGNOSIS — F331 Major depressive disorder, recurrent, moderate: Secondary | ICD-10-CM

## 2023-01-28 DIAGNOSIS — F988 Other specified behavioral and emotional disorders with onset usually occurring in childhood and adolescence: Secondary | ICD-10-CM | POA: Diagnosis not present

## 2023-01-28 DIAGNOSIS — I82403 Acute embolism and thrombosis of unspecified deep veins of lower extremity, bilateral: Secondary | ICD-10-CM

## 2023-01-28 MED ORDER — DULOXETINE HCL 60 MG PO CPEP: 60.0000 mg | ORAL_CAPSULE | Freq: Every day | ORAL | 0 refills | Status: DC

## 2023-01-28 MED ORDER — METHYLPHENIDATE HCL ER (OSM) 27 MG PO TBCR: 27.0000 mg | EXTENDED_RELEASE_TABLET | ORAL | 0 refills | Status: DC

## 2023-01-28 NOTE — Progress Notes (Signed)
OFFICE VISIT  01/28/2023  CC:  Chief Complaint  Patient presents with   DVT    F/U.     Patient is a 48 y.o. female who presents for 6-week follow-up bilateral lower extremity DVTs. A/P as of last visit: "1) HTN, well controlled on lisinopril 10mg  every day. Lytes/cr normal 10/22/22   2) GAD, doing well on duloxetine 30 every day.   3) Adult ADD, not requiring methylphenidate lately b/c not working. No rx needed today.   4) Bilat DVT 09/12/22, +abnl hypercoag panel at hematologist's--->eliquis indefinitely. Pt's legs currently feel very well.   5) Obesity, pt in need of lap band revision but this will have to wait until she has been on eliquis at least 6 months. In the meantime will refer to [facility]."  INTERIM HX: Depression worse the last several weeks.  She feels guilty about not working.  She has lost motivation to do her usual hobbies.  She does have crying spells.  No SI or HI. Appetite is okay, sleep is okay. Her mind is thinking over millions of things that she needs to do or wants to do but she cannot get herself up to do them.  Feels fidgety, frustrated easily, cannot focus.  She has been off of her methylphenidate for quite a while now.  She got a right knee steroid injection at her orthopedist's on 01/20/2023. She did have a tibial fragility fracture and was given nasal calcitonin and bisphosphonate (3 mo planned).  She says recent follow-up showed good healing so far.  Says lower legs swelling is doing pretty well taking Lasix 20 mg every other day.  ROS as above, plus--> no fevers, no CP, no SOB, no wheezing, no cough, no dizziness, no HAs, no rashes, no melena/hematochezia.  No polyuria or polydipsia.   No focal weakness, paresthesias, or tremors.  No acute vision or hearing abnormalities.  No dysuria or unusual/new urinary urgency or frequency. No n/v/d or abd pain.  No palpitations.     Past Medical History:  Diagnosis Date    Allergic rhinitis    Bilateral primary osteoarthritis of knee    severe   Carpal tunnel syndrome    right wrist, numb all the time   Cervical dysplasia    Chronic back pain    Degenerative lumbar spondylosis with grade 2 spondylolisthesis L4 on L5 with bilater pars defects L4.    COVID-19 virus infection 02/06/2020   DDD (degenerative disc disease), lumbar    MRI 10/2015: grade I (7mm) anterolisthesis L4 on L5, w/ disc herniation with encroachment on spinal nerves at L4-S1 levels.   Depression    DVT, lower extremity, recurrent (HCC)    09/2022, bilat->eliquis indef   Edema of both lower extremities due to peripheral venous insufficiency    +varicose veins (hx of vein stripping 2009)   GERD (gastroesophageal reflux disease)    worsened 07/2018 likely associated with lap band being too constricting->increased pantoprazole to 40mg  bid at that time.   History of chronic bronchitis    hx of when she was a smoker: ? mild intermittent asthma?--much better since stopped smoking 2018.   History of fracture of phalanx of finger    left, 4th finger distal phalanx   History of non anemic vitamin B12 deficiency    s/p lap band surgery per pt report.   Hyperlipidemia    Hypertension    Iron deficiency anemia 01/2019   hemoccults ordered but never turned in (01/2019-01/2020).  Iron supp started 02/2020. Hemoccults neg 07/2020, plan for iron infusion. 08/2020 EGD with SB bx normal, no source of IDA seen on colonoscopy. Iron infusion 09/06/2020   LAP-BAND surgery status    Pt getting lap band removed and is getting gastric sleave after.   Migraine syndrome    Ovarian cyst 10/2015   LEFT, 4 cm--noted on L spine MRI w/out contrast.  F/u u/s imaging showed simple cyst.   Spondylarthrosis    Tobacco dependence in remission    quit 2018    Past Surgical History:  Procedure Laterality Date   BACK SURGERY  2019   CARPAL TUNNEL RELEASE Left    CERVICAL CONE BIOPSY     COLONOSCOPY     2015 normal.  08/2020  (for IDA)->incomplete prep, no adenomas->rpt 5 yrs d/t incomplete prep.   ESOPHAGOGASTRODUODENOSCOPY N/A 04/27/2014   Procedure: ESOPHAGOGASTRODUODENOSCOPY (EGD);  Surgeon: Ovidio Kin, MD;  Location: [facility];  Service: General;  Laterality: N/A;   ESOPHAGOGASTRODUODENOSCOPY  08/29/2020   6 cm hiatal hernia noted, o/w normal, SB bx neg.  ?hernia contributing to pt's IDA?   GASTRIC BANDING PORT REVISION N/A 10/02/2014   Procedure: LAPROSCOPIC PLACEMENT OF LAP BAND PORT;  Surgeon: Glenna Fellows, MD;  Location: [facility];  Service: General;  Laterality: N/A;   LAPAROSCOPIC GASTRIC BANDING  03/2007   [facility]; Dr Jorja Loa Hipp   LAPAROSCOPIC REVISION OF GASTRIC BAND N/A 05/17/2012   Procedure: LAPAROSCOPIC REVISION OF SLIPPED GASTRIC BAND;  Surgeon: Mariella Saa, MD;  Location: [facility];  Service: General;  Laterality: N/A;   LAPAROSCOPY N/A 04/30/2014   Procedure: DIAGNOSTIC LAPAROSCOPY WITH REMOVAL OF INFECTED LAP BAND PORT WITH DEBRIDEMENT SUBCUTANEOUS ABSCESS;  Surgeon: Gaynelle Adu, MD;  Location: [facility];  Service: General;  Laterality: N/A;   VARICOSE VEIN SURGERY Bilateral 2009    Outpatient Medications Prior to Visit  Medication Sig Dispense Refill   albuterol (VENTOLIN HFA) 108 (90 Base) MCG/ACT inhaler INHALE 2 PUFFS INTO THE LUNGS EVERY 4 (FOUR) HOURS AS NEEDED FOR WHEEZING OR SHORTNESS OF BREATH. 8 g 0   alendronate (FOSAMAX) 10 MG tablet Take 10 mg by mouth daily before breakfast. Take with a full glass of water on an empty stomach.     apixaban (ELIQUIS) 5 MG TABS tablet Take 1 tablet (5 mg total) by mouth 2 (two) times daily. 60 tablet 4   baclofen (LIORESAL) 20 MG tablet Take 10-20 mg by mouth every 8 (eight) hours.     calcitonin, salmon, (MIACALCIN/FORTICAL) 200 UNIT/ACT nasal spray Place 1 spray into alternate nostrils daily.     cetirizine (ZYRTEC) 10 MG tablet Take 20 mg by mouth daily as needed for allergies (allergies).     diclofenac  (VOLTAREN) 75 MG EC tablet Take 1 tablet by mouth daily.     furosemide (LASIX) 20 MG tablet 1 tab po every day as needed for legs swelling 30 tablet 0   gabapentin (NEURONTIN) 800 MG tablet Take 800 mg by mouth in the morning, at noon, in the evening, and at bedtime.  6   lisinopril (ZESTRIL) 40 MG tablet TAKE 1 TABLET BY MOUTH EVERY DAY 90 tablet 0   Multiple Vitamin (MULITIVITAMIN WITH MINERALS) TABS Take 1 tablet by mouth daily.     NARCAN 4 MG/0.1ML LIQD nasal spray kit      Oxycodone HCl 10 MG TABS Take 10 mg by mouth every 6 (six) hours. Do not fill until 05/26/2022 for 30 days.  pantoprazole (PROTONIX) 40 MG tablet TAKE 1 TABLET BY MOUTH TWICE A DAY 180 tablet 0   rizatriptan (MAXALT-MLT) 10 MG disintegrating tablet TAKE 1 TABLET BY MOUTH AS NEEDED FOR MIGRAINE. MAY REPEAT IN 2 HOURS IF NEEDED 10 tablet 0   SODIUM FLUORIDE, DENTAL RINSE, 0.2 % SOLN Take by mouth.     DULoxetine (CYMBALTA) 30 MG capsule TAKE 1 CAPSULE BY MOUTH EVERY DAY 90 capsule 1   methylphenidate 27 MG PO CR tablet Take 1 tablet (27 mg total) by mouth every morning. (Patient not taking: Reported on 01/28/2023) 30 tablet 0   No facility-administered medications prior to visit.    Allergies  Allergen Reactions   Meperidine Hives and Nausea And Vomiting   Vancomycin Anaphylaxis and Itching    Face/back warm and red.  Pt lips and tongue swelled, hives   Demerol Hives and Nausea And Vomiting   Microplegia Msa-Msg  [Cardioplegia Del Nido Formula] Other (See Comments)    Other reaction(s): headache   Monosodium Glutamate Nausea And Vomiting and Other (See Comments)    migraines    Tape Rash    Plastic tape    Review of Systems As per HPI  PE:    01/28/2023    2:36 PM 12/12/2022    1:06 PM 11/07/2022    1:08 PM  Vitals with BMI  Weight 261 lbs 259 lbs 3 oz 259 lbs 10 oz  BMI   46  Systolic 124 125 161  Diastolic 68 77 85  Pulse 69 68 81     Physical Exam  Gen: Alert, well appearing.  Patient is  oriented to person, place, time, and situation. AFFECT: pleasant, lucid thought and speech. Right lower leg with some mild nonpitting edema.  Left lower leg with similar mild nonpitting edema but also 1-2+ pitting edema. No rash or tenderness.  LABS:  Last CBC Lab Results  Component Value Date   WBC 8.1 10/22/2022   HGB 11.6 (L) 10/22/2022   HCT 36.9 10/22/2022   MCV 85.2 10/22/2022   MCH 26.8 10/22/2022   RDW 14.1 10/22/2022   PLT 220 10/22/2022   Lab Results  Component Value Date   IRON 49 06/06/2022   TIBC 333 06/06/2022   FERRITIN 21 06/06/2022   Last metabolic panel Lab Results  Component Value Date   GLUCOSE 93 10/22/2022   NA 140 10/22/2022   K 3.6 10/22/2022   CL 101 10/22/2022   CO2 32 10/22/2022   BUN 11 10/22/2022   CREATININE 0.71 10/22/2022   GFRNONAA >60 10/22/2022   CALCIUM 9.4 10/22/2022   PROT 7.1 10/22/2022   ALBUMIN 3.8 10/22/2022   BILITOT 0.3 10/22/2022   ALKPHOS 57 10/22/2022   AST 14 (L) 10/22/2022   ALT 12 10/22/2022   ANIONGAP 7 10/22/2022   Last thyroid functions Lab Results  Component Value Date   TSH 1.60 06/06/2022   IMPRESSION AND PLAN:  #1 major depressive disorder, not well-controlled. Increase Cymbalta to 60 mg a day.  2.  Adult ADD. I recommend she restart her methylphenidate CD20 7 mg a day.  New prescription sent in today.  3.  Bilat DVT 09/12/22, +abnl hypercoag panel at hematologist's--->eliquis indefinitely. Has not had follow-up with hematology yet. Still poor mobility and generalized legs weakness due to the swelling and gradual resolution of the thrombotic syndrome.  I plan on extending her time out of work for 3 additional months.  She will get me the [facility] paperwork.  An After Visit Summary  was printed and given to the patient.  FOLLOW UP: Return in about 2 weeks (around 02/11/2023) for f/u dep. Next CPE 05/2023  Signed:  Santiago Bumpers, MD           01/28/2023

## 2023-02-04 ENCOUNTER — Encounter: Payer: Self-pay | Admitting: Family Medicine

## 2023-02-04 NOTE — Telephone Encounter (Signed)
Pls call and have her tell the exact date when her current absence is set to expire and I can then clearly state the additional 3 month period in the letter. thx

## 2023-02-04 NOTE — Telephone Encounter (Signed)
Copied and pasted CRM  Reason for CRM: pt called and stated that Dr. Milinda Cave told her to callback with information on where he should send her letter for a leave of absence to Increases her leave to 3 more months. Please send the letter to [facility] Fax: [phone].

## 2023-02-04 NOTE — Telephone Encounter (Signed)
LVM to discuss

## 2023-02-11 ENCOUNTER — Ambulatory Visit: Payer: Self-pay | Admitting: Family Medicine

## 2023-02-11 ENCOUNTER — Encounter: Payer: Self-pay | Admitting: Family Medicine

## 2023-02-11 VITALS — BP 136/84 | HR 65 | Temp 99.1°F | Wt 262.4 lb

## 2023-02-11 DIAGNOSIS — I82403 Acute embolism and thrombosis of unspecified deep veins of lower extremity, bilateral: Secondary | ICD-10-CM

## 2023-02-11 DIAGNOSIS — M170 Bilateral primary osteoarthritis of knee: Secondary | ICD-10-CM

## 2023-02-11 DIAGNOSIS — F3342 Major depressive disorder, recurrent, in full remission: Secondary | ICD-10-CM

## 2023-02-11 NOTE — Telephone Encounter (Signed)
 Expired feb 1

## 2023-02-11 NOTE — Progress Notes (Signed)
 OFFICE VISIT  02/11/2023  CC:  Chief Complaint  Patient presents with   Depression    Patient is a 48 y.o. female who presents for 2-week follow-up depression. A/P as of last visit: #1 major depressive disorder, not well-controlled. Increase Cymbalta  to 60 mg a day.   2.  Adult ADD. I recommend she restart her methylphenidate  CD20 7 mg a day.  New prescription sent in today.   3.  Bilat DVT 09/12/22, +abnl hypercoag panel at hematologist's--->eliquis  indefinitely. Has not had follow-up with hematology yet. Still poor mobility and generalized legs weakness due to the swelling and gradual resolution of the thrombotic syndrome.  I plan on extending her time out of work for 3 additional months.  She will get me the [facility] paperwork.  INTERIM HX: Depression is significantly improved.  She feels like the medication increase has helped but also has a new puppy and this has helped a lot as well.   PMP AWARE reviewed today: most recent rx for methylphenidate  was filled 09/11/22, # 30, rx by me. No red flags.  Past Medical History:  Diagnosis Date   Allergic rhinitis    Bilateral primary osteoarthritis of knee    severe   Carpal tunnel syndrome    right wrist, numb all the time   Cervical dysplasia    Chronic back pain    Degenerative lumbar spondylosis with grade 2 spondylolisthesis L4 on L5 with bilater pars defects L4.    COVID-19 virus infection 02/06/2020   DDD (degenerative disc disease), lumbar    MRI 10/2015: grade I (7mm) anterolisthesis L4 on L5, w/ disc herniation with encroachment on spinal nerves at L4-S1 levels.   Depression    DVT, lower extremity, recurrent (HCC)    09/2022, bilat->eliquis  indef   Edema of both lower extremities due to peripheral venous insufficiency    +varicose veins (hx of vein stripping 2009)   GERD (gastroesophageal reflux disease)    worsened 07/2018 likely associated with lap band being too constricting->increased pantoprazole  to 40mg  bid at that  time.   History of chronic bronchitis    hx of when she was a smoker: ? mild intermittent asthma?--much better since stopped smoking 2018.   History of fracture of phalanx of finger    left, 4th finger distal phalanx   History of non anemic vitamin B12 deficiency    s/p lap band surgery per pt report.   Hyperlipidemia    Hypertension    Iron  deficiency anemia 01/2019   hemoccults ordered but never turned in (01/2019-01/2020). Iron  supp started 02/2020. Hemoccults neg 07/2020, plan for iron  infusion. 08/2020 EGD with SB bx normal, no source of IDA seen on colonoscopy. Iron  infusion 09/06/2020   LAP-BAND surgery status    Pt getting lap band removed and is getting gastric sleave after.   Migraine syndrome    Ovarian cyst 10/2015   LEFT, 4 cm--noted on L spine MRI w/out contrast.  F/u u/s imaging showed simple cyst.   Right tibial fracture    Fragility fracture 2024/2025-->calcitonin + bisphosphonate per ortho   Spondylarthrosis    Tobacco dependence in remission    quit 2018    Past Surgical History:  Procedure Laterality Date   BACK SURGERY  2019   CARPAL TUNNEL RELEASE Left    CERVICAL CONE BIOPSY     COLONOSCOPY     2015 normal.  08/2020 (for IDA)->incomplete prep, no adenomas->rpt 5 yrs d/t incomplete prep.   ESOPHAGOGASTRODUODENOSCOPY N/A 04/27/2014   Procedure: ESOPHAGOGASTRODUODENOSCOPY (  EGD);  Surgeon: Alm Angle, MD;  Location: [facility];  Service: General;  Laterality: N/A;   ESOPHAGOGASTRODUODENOSCOPY  08/29/2020   6 cm hiatal hernia noted, o/w normal, SB bx neg.  ?hernia contributing to pt's IDA?   GASTRIC BANDING PORT REVISION N/A 10/02/2014   Procedure: LAPROSCOPIC PLACEMENT OF LAP BAND PORT;  Surgeon: Morene Olives, MD;  Location: [facility];  Service: General;  Laterality: N/A;   LAPAROSCOPIC GASTRIC BANDING  03/2007   [facility] - [facility]; Dr Velinda Hipp   LAPAROSCOPIC REVISION OF GASTRIC BAND N/A 05/17/2012   Procedure: LAPAROSCOPIC REVISION OF  SLIPPED GASTRIC BAND;  Surgeon: Morene ONEIDA Olives, MD;  Location: [facility];  Service: General;  Laterality: N/A;   LAPAROSCOPY N/A 04/30/2014   Procedure: DIAGNOSTIC LAPAROSCOPY WITH REMOVAL OF INFECTED LAP BAND PORT WITH DEBRIDEMENT SUBCUTANEOUS ABSCESS;  Surgeon: Camellia Blush, MD;  Location: [facility];  Service: General;  Laterality: N/A;   VARICOSE VEIN SURGERY Bilateral 2009    Outpatient Medications Prior to Visit  Medication Sig Dispense Refill   albuterol  (VENTOLIN  HFA) 108 (90 Base) MCG/ACT inhaler INHALE 2 PUFFS INTO THE LUNGS EVERY 4 (FOUR) HOURS AS NEEDED FOR WHEEZING OR SHORTNESS OF BREATH. 8 g 0   alendronate (FOSAMAX) 10 MG tablet Take 10 mg by mouth daily before breakfast. Take with a full glass of water  on an empty stomach.     apixaban  (ELIQUIS ) 5 MG TABS tablet Take 1 tablet (5 mg total) by mouth 2 (two) times daily. 60 tablet 4   baclofen (LIORESAL) 20 MG tablet Take 10-20 mg by mouth every 8 (eight) hours.     calcitonin, salmon, (MIACALCIN/FORTICAL) 200 UNIT/ACT nasal spray Place 1 spray into alternate nostrils daily.     cetirizine (ZYRTEC) 10 MG tablet Take 20 mg by mouth daily as needed for allergies (allergies).     diclofenac  (VOLTAREN ) 75 MG EC tablet Take 1 tablet by mouth daily.     DULoxetine  (CYMBALTA ) 60 MG capsule Take 1 capsule (60 mg total) by mouth daily. 90 capsule 0   furosemide  (LASIX ) 20 MG tablet 1 tab po every day as needed for legs swelling 30 tablet 0   gabapentin  (NEURONTIN ) 800 MG tablet Take 800 mg by mouth in the morning, at noon, in the evening, and at bedtime.  6   lisinopril  (ZESTRIL ) 40 MG tablet TAKE 1 TABLET BY MOUTH EVERY DAY 90 tablet 0   methylphenidate  27 MG PO CR tablet Take 1 tablet (27 mg total) by mouth every morning. 30 tablet 0   Multiple Vitamin (MULITIVITAMIN WITH MINERALS) TABS Take 1 tablet by mouth daily.     NARCAN 4 MG/0.1ML LIQD nasal spray kit      Oxycodone  HCl 10 MG TABS Take 10 mg by mouth every 6 (six) hours. Do not fill  until 05/26/2022 for 30 days.     pantoprazole  (PROTONIX ) 40 MG tablet TAKE 1 TABLET BY MOUTH TWICE A DAY 180 tablet 0   rizatriptan  (MAXALT -MLT) 10 MG disintegrating tablet TAKE 1 TABLET BY MOUTH AS NEEDED FOR MIGRAINE. MAY REPEAT IN 2 HOURS IF NEEDED 10 tablet 0   SODIUM FLUORIDE, DENTAL RINSE, 0.2 % SOLN Take by mouth.     No facility-administered medications prior to visit.    Allergies  Allergen Reactions   Meperidine Hives and Nausea And Vomiting   Vancomycin  Anaphylaxis and Itching    Face/back warm and red.  Pt lips and tongue swelled, hives   Demerol Hives and Nausea And  Vomiting   Microplegia Msa-Msg  [Cardioplegia [place] Nido Formula] Other (See Comments)    Other reaction(s): headache   Monosodium Glutamate Nausea And Vomiting and Other (See Comments)    migraines    Tape Rash    Plastic tape    Review of Systems As per HPI  PE:    02/11/2023    2:32 PM 01/28/2023    2:36 PM 12/12/2022    1:06 PM  Vitals with BMI  Weight 262 lbs 6 oz 261 lbs 259 lbs 3 oz  Systolic 136 124 874  Diastolic 84 68 77  Pulse 65 69 68     Physical Exam  Gen: Alert, well appearing.  Patient is oriented to person, place, time, and situation. AFFECT: pleasant, lucid thought and speech. No further today  LABS:  Last CBC Lab Results  Component Value Date   WBC 8.1 10/22/2022   HGB 11.6 (L) 10/22/2022   HCT 36.9 10/22/2022   MCV 85.2 10/22/2022   MCH 26.8 10/22/2022   RDW 14.1 10/22/2022   PLT 220 10/22/2022   Last metabolic panel Lab Results  Component Value Date   GLUCOSE 93 10/22/2022   NA 140 10/22/2022   K 3.6 10/22/2022   CL 101 10/22/2022   CO2 32 10/22/2022   BUN 11 10/22/2022   CREATININE 0.71 10/22/2022   GFRNONAA >60 10/22/2022   CALCIUM 9.4 10/22/2022   PROT 7.1 10/22/2022   ALBUMIN 3.8 10/22/2022   BILITOT 0.3 10/22/2022   ALKPHOS 57 10/22/2022   AST 14 (L) 10/22/2022   ALT 12 10/22/2022   ANIONGAP 7 10/22/2022   IMPRESSION AND PLAN:  #1 major  depressive disorder, in remission. Continue Cymbalta  60 mg a day.  #2 bilat DVT 09/12/22, +abnl hypercoag panel at hematologist's--->eliquis  indefinitely. Has not had follow-up with hematology yet.  #3 bilateral knee osteoarthritis, severe. This is affecting her a lot more since her diclofenac  is contraindicated while on Eliquis . Still poor mobility and generalized legs weakness due to the swelling and gradual resolution of the thrombotic syndrome.  Extend out of work recommendation for 3 additional months--letter written today (approx return date 05/11/23). The plan is for her to eventually get right knee replacement but she needs to be on Eliquis  for minimum of 6 months and we will also get repeat lower extremity Dopplers prior to surgery.  An After Visit Summary was printed and given to the patient.  FOLLOW UP: Return in about 3 months (around 05/11/2023) for routine chronic illness f/u.  Signed:  Gerlene Hockey, MD           02/11/2023

## 2023-02-20 ENCOUNTER — Telehealth: Payer: Self-pay

## 2023-02-20 NOTE — Telephone Encounter (Signed)
Copied from CRM [id number]. Topic: Medical Record Request - Other >> Feb 18, 2023  2:19 PM Gurney Maxin H wrote: Reason for CRM: Patient states provider sent medical information to insurance company but insurance states they only received records from September 6-17 2024 records and nothing else. Patient states they need/requested records from September 2024 to current. Please send records to information below, patient states she already signed a release. Thanks  Louanne Skye [employer] [phone]

## 2023-02-26 ENCOUNTER — Encounter: Payer: Self-pay | Admitting: Family Medicine

## 2023-03-02 ENCOUNTER — Telehealth: Payer: Self-pay

## 2023-03-02 DIAGNOSIS — I82403 Acute embolism and thrombosis of unspecified deep veins of lower extremity, bilateral: Secondary | ICD-10-CM

## 2023-03-02 MED ORDER — WARFARIN SODIUM 2.5 MG PO TABS: ORAL_TABLET | ORAL | 0 refills | Status: DC

## 2023-03-02 NOTE — Telephone Encounter (Signed)
 Copied from CRM [id number]. Topic: Clinical - Prescription Issue >> Mar 02, 2023 10:40 AM Samantha Doyle wrote: Reason for CRM: Eliquis is not covered by insurance. Patient did state that the 4 month period she was supposed to take the Eliquis has been completed. If she needs to take this medication longer she would like a generic brand sent in.   Please advise if pt needs to continue on this medication.

## 2023-03-02 NOTE — Addendum Note (Signed)
 Addended by: Emi Holes D on: 03/02/2023 03:47 PM   Modules accepted: Orders

## 2023-03-02 NOTE — Telephone Encounter (Signed)
 OK, stop eliquis. Do rx for coumadin 2.5mg , take 2 tabs per day x 2 days then 1 tab daily.  Needs lab visit for PT/INR in 3d, dx acute bilateral lower extremity dvt. Do coumadin rx for #30 tabs and sig should state "take as directed by MD".

## 2023-03-02 NOTE — Telephone Encounter (Signed)
 Pt verbalized understanding. Rx sent to her pharmacy

## 2023-03-12 ENCOUNTER — Other Ambulatory Visit: Payer: Self-pay

## 2023-03-12 DIAGNOSIS — I82403 Acute embolism and thrombosis of unspecified deep veins of lower extremity, bilateral: Secondary | ICD-10-CM

## 2023-03-12 NOTE — Addendum Note (Signed)
 Addended by: Sheliah Plane on: 03/12/2023 02:51 PM   Modules accepted: Orders

## 2023-03-13 ENCOUNTER — Telehealth: Payer: Self-pay

## 2023-03-13 DIAGNOSIS — Z7901 Long term (current) use of anticoagulants: Secondary | ICD-10-CM

## 2023-03-13 LAB — PROTIME-INR
INR: 2.8 — ABNORMAL HIGH
Prothrombin Time: 27.8 s — ABNORMAL HIGH (ref 9.0–11.5)

## 2023-03-13 NOTE — Telephone Encounter (Signed)
-----   Message from Jeoffrey Massed sent at 03/13/2023 12:36 PM EST ----- Please notify Ange that her Coumadin level is perfect. Continue 2.5 mg tab once a day. I recommend recheck PT/INR in 2 weeks, diagnosis chronic anticoagulation.

## 2023-03-26 ENCOUNTER — Other Ambulatory Visit: Payer: Self-pay | Admitting: Family Medicine

## 2023-03-27 ENCOUNTER — Ambulatory Visit: Payer: Self-pay | Admitting: Family Medicine

## 2023-03-27 ENCOUNTER — Other Ambulatory Visit: Payer: Self-pay

## 2023-03-27 ENCOUNTER — Encounter: Payer: Self-pay | Admitting: Family Medicine

## 2023-03-27 NOTE — Progress Notes (Signed)
 Pt cancelled

## 2023-04-02 ENCOUNTER — Other Ambulatory Visit: Payer: Self-pay | Admitting: Family Medicine

## 2023-04-02 ENCOUNTER — Encounter: Payer: Self-pay | Admitting: Family Medicine

## 2023-04-03 MED ORDER — WARFARIN SODIUM 2.5 MG PO TABS: ORAL_TABLET | ORAL | 0 refills | Status: DC

## 2023-04-14 ENCOUNTER — Emergency Department

## 2023-04-14 ENCOUNTER — Emergency Department
Admission: EM | Admit: 2023-04-14 | Discharge: 2023-04-15 | Disposition: A | Discharge status: Home / Self Care | Attending: Emergency Medicine | Admitting: Emergency Medicine

## 2023-04-14 DIAGNOSIS — Z7901 Long term (current) use of anticoagulants: Secondary | ICD-10-CM | POA: Diagnosis not present

## 2023-04-14 DIAGNOSIS — I83892 Varicose veins of left lower extremities with other complications: Secondary | ICD-10-CM | POA: Insufficient documentation

## 2023-04-14 DIAGNOSIS — M79622 Pain in left upper arm: Secondary | ICD-10-CM | POA: Diagnosis present

## 2023-04-14 DIAGNOSIS — I83899 Varicose veins of unspecified lower extremities with other complications: Secondary | ICD-10-CM

## 2023-04-14 NOTE — Discharge Instructions (Addendum)
 Return to the ER for worsening symptoms, persistent vomiting, chest pain, difficulty breathing or other concerns.

## 2023-04-14 NOTE — ED Triage Notes (Signed)
 Pt presents to the ED POV from home Pt reports being diagnosed with DVT's in bilateral legs in October. Pt was started on coumadin for tx. Pt noticed a lump behind her left knee today that is warm and painful and is concerned for another blood clot.

## 2023-04-14 NOTE — ED Provider Notes (Signed)
 [facility] Provider Note    Event Date/Time   First MD Initiated Contact with Patient 04/14/23 2054     (approximate)   History   Chief Complaint: Leg Pain   HPI  Samantha Doyle is a 48 y.o. female with a history of chronic back pain, GERD, bilateral lower extremity DVT on warfarin who comes ED complaining of a painful knot on the left upper calf that she noticed today.  She has been compliant with her anticoagulation.  INR checked about 1 month ago was at goal.  No changes in medication dosing or diet.  Does report wheezing compression stockings on both legs as well as a mechanical massage device on her legs yesterday.  No chest pain shortness of breath or fever          Physical Exam   Triage Vital Signs: ED Triage Vitals  Encounter Vitals Group     BP 04/14/23 1811 138/71     Systolic BP Percentile --      Diastolic BP Percentile --      Pulse Rate 04/14/23 1811 95     Resp 04/14/23 1811 18     Temp 04/14/23 1811 98.4 F (36.9 C)     Temp Source 04/14/23 1811 Oral     SpO2 04/14/23 1811 94 %     Weight 04/14/23 1813 240 lb (108.9 kg)     Height 04/14/23 1813 5\' 3"  (1.6 m)     Head Circumference --      Peak Flow --      Pain Score 04/14/23 1812 4     Pain Loc --      Pain Education --      Exclude from Growth Chart --     Most recent vital signs: Vitals:   04/14/23 1811  BP: 138/71  Pulse: 95  Resp: 18  Temp: 98.4 F (36.9 C)  SpO2: 94%    General: Awake, no distress.  CV:  Good peripheral perfusion.  Resp:  Normal effort.  Abd:  No distention.  Other:  Symmetric calf circumference.  No erythema or soft tissue swelling.  At the left calf proximal lateral aspect, there is small area of what appears to be hematoma related to varicose vein.  There are 2 small bruises on the anterior aspect of the lower leg as well which patient attributes to minor bumps and warfarin effect.   ED Results / Procedures / Treatments    Labs (all labs ordered are listed, but only abnormal results are displayed) Labs Reviewed - No data to display   EKG    RADIOLOGY Ultrasound left lower extremity interpreted by me, negative for DVT.  Radiology report reviewed   PROCEDURES:  Procedures   MEDICATIONS ORDERED IN ED: Medications - No data to display   IMPRESSION / MDM / ASSESSMENT AND PLAN / ED COURSE  I reviewed the triage vital signs and the nursing notes.  DDx: Subcutaneous hematoma, superficial thrombophlebitis, less likely DVT  Patient's presentation is most consistent with acute presentation with potential threat to life or bodily function.  Patient presents with painful swelling on the left leg, most likely hematoma or thrombosed varicosity.  However with her history of DVT of bilateral lower extremities 6 months ago, will obtain ultrasound to rule out recurrent DVT/AC treatment failure.  Offered analgesics, patient declines for now.   Clinical Course as of 04/14/23 2241  Tue Apr 14, 2023  2238 Pt now notes that the painful  area on her calf has changed, now shows a more diffuse area of ecchymosis, c/w ruptured SQ varicose vein. [place] has been completed. Will f/u report and plan for DC. [PS]    Clinical Course User Index [PS] Sharman Cheek, MD     FINAL CLINICAL IMPRESSION(S) / ED DIAGNOSES   Final diagnoses:  Ruptured varicose vein     Rx / DC Orders   ED Discharge Orders     None        Note:  This document was prepared using Dragon voice recognition software and may include unintentional dictation errors.   Sharman Cheek, MD 04/14/23 2241

## 2023-04-15 NOTE — ED Provider Notes (Signed)
-----------------------------------------   12:54 AM on 04/15/2023 -----------------------------------------   Delay due to radiology.  DVT ultrasound is negative.  Strict return precautions given.  Patient verbalizes understanding and agrees with plan of care.   Irean Hong, MD 04/15/23 [phone]

## 2023-04-24 ENCOUNTER — Other Ambulatory Visit: Payer: Self-pay | Admitting: Family Medicine

## 2023-04-27 ENCOUNTER — Other Ambulatory Visit: Payer: Self-pay | Admitting: Family Medicine

## 2023-04-27 MED ORDER — LISINOPRIL 40 MG PO TABS: 40.0000 mg | ORAL_TABLET | Freq: Every day | ORAL | 0 refills | Status: DC

## 2023-04-30 ENCOUNTER — Encounter: Payer: Self-pay | Admitting: Family Medicine

## 2023-04-30 ENCOUNTER — Ambulatory Visit (INDEPENDENT_AMBULATORY_CARE_PROVIDER_SITE_OTHER): Payer: Self-pay | Admitting: Family Medicine

## 2023-04-30 VITALS — BP 150/86 | HR 88 | Temp 98.5°F | Ht 63.0 in | Wt 252.4 lb

## 2023-04-30 DIAGNOSIS — I82403 Acute embolism and thrombosis of unspecified deep veins of lower extremity, bilateral: Secondary | ICD-10-CM | POA: Diagnosis not present

## 2023-04-30 DIAGNOSIS — M170 Bilateral primary osteoarthritis of knee: Secondary | ICD-10-CM

## 2023-04-30 DIAGNOSIS — Z7901 Long term (current) use of anticoagulants: Secondary | ICD-10-CM | POA: Diagnosis not present

## 2023-04-30 MED ORDER — LISINOPRIL 40 MG PO TABS: 40.0000 mg | ORAL_TABLET | Freq: Every day | ORAL | 3 refills | Status: AC

## 2023-04-30 MED ORDER — METHYLPHENIDATE HCL ER (OSM) 27 MG PO TBCR: 27.0000 mg | EXTENDED_RELEASE_TABLET | ORAL | 0 refills | Status: DC

## 2023-04-30 MED ORDER — PANTOPRAZOLE SODIUM 40 MG PO TBEC: 40.0000 mg | DELAYED_RELEASE_TABLET | Freq: Two times a day (BID) | ORAL | 3 refills | Status: AC

## 2023-04-30 MED ORDER — DULOXETINE HCL 60 MG PO CPEP: 60.0000 mg | ORAL_CAPSULE | Freq: Every day | ORAL | 3 refills | Status: AC

## 2023-04-30 NOTE — Progress Notes (Signed)
 OFFICE VISIT  04/30/2023  CC:  Chief Complaint  Patient presents with   Medical Management of Chronic Issues    Pt admits sh has not taken Coumadin  since 4/8, requesting letter stating ok to return to work August    Patient is a 48 y.o. female who presents for question of anticoagulant for her DVTs.  INTERIM HX: Samantha Doyle feels well.  She had bilateral DVTs diagnosed 09/12/2022.  +abnl hypercoag panel at hematologist's--->eliquis  indefinitely.  Eliquis  became too cost prohibitive so we decided to change to coumadin . After we initiated a switch over from Eliquis  to Coumadin  her INR was 2.8.  I continued her Coumadin  at a 2.5 mg once daily dosing at that time (03/12/23).  She presented to the emergency department on 04/14/2023 for a painful knot on the left upper calf.  It was felt this was a ruptured/thrombosed varicosity.  However, given her high risk for DVT and a left lower extremity venous Doppler was done to further evaluate for possible treatment failure.  The ultrasound was completely normal. Since that time she has not taking Coumadin .  Her severe knee pain due to osteoarthritis responds well to NSAIDs.  Past Medical History:  Diagnosis Date   Allergic rhinitis    Bilateral primary osteoarthritis of knee    severe   Carpal tunnel syndrome    right wrist, numb all the time   Cervical dysplasia    Chronic back pain    Degenerative lumbar spondylosis with grade 2 spondylolisthesis L4 on L5 with bilater pars defects L4.    COVID-19 virus infection 02/06/2020   DDD (degenerative disc disease), lumbar    MRI 10/2015: grade I (7mm) anterolisthesis L4 on L5, w/ disc herniation with encroachment on spinal nerves at L4-S1 levels.   Depression    DVT, lower extremity, recurrent (HCC)    09/2022, bilat->eliquis  indef   Edema of both lower extremities due to peripheral venous insufficiency    +varicose veins (hx of vein stripping 2009)   GERD (gastroesophageal reflux disease)    worsened  07/2018 likely associated with lap band being too constricting->increased pantoprazole  to 40mg  bid at that time.   History of chronic bronchitis    hx of when she was a smoker: ? mild intermittent asthma?--much better since stopped smoking 2018.   History of fracture of phalanx of finger    left, 4th finger distal phalanx   History of non anemic vitamin B12 deficiency    s/p lap band surgery per pt report.   Hyperlipidemia    Hypertension    Iron  deficiency anemia 01/2019   hemoccults ordered but never turned in (01/2019-01/2020). Iron  supp started 02/2020. Hemoccults neg 07/2020, plan for iron  infusion. 08/2020 EGD with SB bx normal, no source of IDA seen on colonoscopy. Iron  infusion 09/06/2020   LAP-BAND surgery status    Pt getting lap band removed and is getting gastric sleave after.   Migraine syndrome    Ovarian cyst 10/2015   LEFT, 4 cm--noted on L spine MRI w/out contrast.  F/u u/s imaging showed simple cyst.   Right tibial fracture    Fragility fracture 2024/2025-->calcitonin + bisphosphonate per ortho   Spondylarthrosis    Tobacco dependence in remission    quit 2018    Past Surgical History:  Procedure Laterality Date   BACK SURGERY  2019   CARPAL TUNNEL RELEASE Left    CERVICAL CONE BIOPSY     COLONOSCOPY     2015 normal.  08/2020 (for IDA)->incomplete prep, no  adenomas->rpt 5 yrs d/t incomplete prep.   ESOPHAGOGASTRODUODENOSCOPY N/A 04/27/2014   Procedure: ESOPHAGOGASTRODUODENOSCOPY (EGD);  Surgeon: Juanita Norlander, MD;  Location: [facility];  Service: General;  Laterality: N/A;   ESOPHAGOGASTRODUODENOSCOPY  08/29/2020   6 cm hiatal hernia noted, o/w normal, SB bx neg.  ?hernia contributing to pt's IDA?   GASTRIC BANDING PORT REVISION N/A 10/02/2014   Procedure: LAPROSCOPIC PLACEMENT OF LAP BAND PORT;  Surgeon: Ayesha Lente, MD;  Location: [facility];  Service: General;  Laterality: N/A;   LAPAROSCOPIC GASTRIC BANDING  03/2007   [facility] - [facility]; Dr Wandalee Gust  Hipp   LAPAROSCOPIC REVISION OF GASTRIC BAND N/A 05/17/2012   Procedure: LAPAROSCOPIC REVISION OF SLIPPED GASTRIC BAND;  Surgeon: Quitman Bucy, MD;  Location: [facility];  Service: General;  Laterality: N/A;   LAPAROSCOPY N/A 04/30/2014   Procedure: DIAGNOSTIC LAPAROSCOPY WITH REMOVAL OF INFECTED LAP BAND PORT WITH DEBRIDEMENT SUBCUTANEOUS ABSCESS;  Surgeon: Aldean Hummingbird, MD;  Location: [facility];  Service: General;  Laterality: N/A;   VARICOSE VEIN SURGERY Bilateral 2009    Outpatient Medications Prior to Visit  Medication Sig Dispense Refill   albuterol  (VENTOLIN  HFA) 108 (90 Base) MCG/ACT inhaler INHALE 2 PUFFS INTO THE LUNGS EVERY 4 (FOUR) HOURS AS NEEDED FOR WHEEZING OR SHORTNESS OF BREATH. 8 g 0   alendronate (FOSAMAX) 10 MG tablet Take 10 mg by mouth daily before breakfast. Take with a full glass of water on an empty stomach.     baclofen (LIORESAL) 20 MG tablet Take 10-20 mg by mouth every 8 (eight) hours.     calcitonin, salmon, (MIACALCIN/FORTICAL) 200 UNIT/ACT nasal spray Place 1 spray into alternate nostrils daily.     cetirizine (ZYRTEC) 10 MG tablet Take 20 mg by mouth daily as needed for allergies (allergies).     furosemide  (LASIX ) 20 MG tablet 1 tab po every day as needed for legs swelling 30 tablet 0   gabapentin  (NEURONTIN ) 800 MG tablet Take 800 mg by mouth in the morning, at noon, in the evening, and at bedtime.  6   Multiple Vitamin (MULITIVITAMIN WITH MINERALS) TABS Take 1 tablet by mouth daily.     NARCAN 4 MG/0.1ML LIQD nasal spray kit      Oxycodone  HCl 10 MG TABS Take 10 mg by mouth every 6 (six) hours. Do not fill until 05/26/2022 for 30 days.     rizatriptan  (MAXALT -MLT) 10 MG disintegrating tablet TAKE 1 TABLET BY MOUTH AS NEEDED FOR MIGRAINE. MAY REPEAT IN 2 HOURS IF NEEDED 10 tablet 0   SODIUM FLUORIDE, DENTAL RINSE, 0.2 % SOLN Take by mouth.     lisinopril  (ZESTRIL ) 40 MG tablet Take 1 tablet (40 mg total) by mouth daily. 90 tablet 0   diclofenac  (VOLTAREN ) 75  MG EC tablet Take 1 tablet by mouth daily. (Patient not taking: Reported on 04/30/2023)     DULoxetine  (CYMBALTA ) 60 MG capsule Take 1 capsule (60 mg total) by mouth daily. 90 capsule 0   methylphenidate  27 MG PO CR tablet Take 1 tablet (27 mg total) by mouth every morning. 30 tablet 0   pantoprazole  (PROTONIX ) 40 MG tablet TAKE 1 TABLET BY MOUTH TWICE A DAY 180 tablet 0   warfarin (COUMADIN ) 2.5 MG tablet TAKE AS DIRECTED BY MD (Patient not taking: Reported on 04/30/2023) 30 tablet 0   No facility-administered medications prior to visit.    Allergies  Allergen Reactions   Meperidine Hives and Nausea And Vomiting   Vancomycin  Anaphylaxis and Itching  Face/back warm and red.  Pt lips and tongue swelled, hives   Demerol Hives and Nausea And Vomiting   Microplegia Msa-Msg  [Cardioplegia Del Nido Formula] Other (See Comments)    Other reaction(s): headache   Monosodium Glutamate Nausea And Vomiting and Other (See Comments)    migraines    Tape Rash    Plastic tape    Review of Systems As per HPI  PE:    04/30/2023    2:57 PM 04/30/2023    2:45 PM 04/14/2023    6:13 PM  Vitals with BMI  Height  5\' 3"  5\' 3"   Weight  252 lbs 6 oz 240 lbs  BMI  44.72 42.52  Systolic 150 141   Diastolic 86 92   Pulse  88      Physical Exam  Gen: Alert, well appearing.  Patient is oriented to person, place, time, and situation. AFFECT: pleasant, lucid thought and speech. Knees without erythema or warmth. Lower legs are symmetric. She has no pitting edema. No tenderness.  LABS:  Last CBC Lab Results  Component Value Date   WBC 8.1 10/22/2022   HGB 11.6 (L) 10/22/2022   HCT 36.9 10/22/2022   MCV 85.2 10/22/2022   MCH 26.8 10/22/2022   RDW 14.1 10/22/2022   PLT 220 10/22/2022   Last metabolic panel Lab Results  Component Value Date   GLUCOSE 93 10/22/2022   NA 140 10/22/2022   K 3.6 10/22/2022   CL 101 10/22/2022   CO2 32 10/22/2022   BUN 11 10/22/2022   CREATININE 0.71  10/22/2022   GFRNONAA >60 10/22/2022   CALCIUM 9.4 10/22/2022   PROT 7.1 10/22/2022   ALBUMIN 3.8 10/22/2022   BILITOT 0.3 10/22/2022   ALKPHOS 57 10/22/2022   AST 14 (L) 10/22/2022   ALT 12 10/22/2022   ANIONGAP 7 10/22/2022   Lab Results  Component Value Date   INR 2.8 (H) 03/12/2023   IMPRESSION AND PLAN:  #1 recurrent DVTs. Unprovoked.  She had an abnormality on her hypercoagulability panel with the hematologist. It was recommended that she be on anticoagulant indefinitely. She has chosen to stop anticoagulant since 04/14/2023 when a left leg Doppler showed no DVT. Her biggest reason for not wanting to be on anticoagulant is her inability to take NSAIDs for her knees when she is on an the anticoagulant. She understands the risks of recurrent clot without anticoagulant. She is very much wanting a PT/INR checked today--> ordered.  #2 bilateral knee osteoarthritis, end-stage. She and her orthopedist are planning for total knee replacement in the future. She is working on getting her weight down.  Until surgery she wants to use NSAIDs as needed because these help her very well. I do think she needs 3 more months out of work, letter written today to extend out of work time until 08/07/2023.  An After Visit Summary was printed and given to the patient.  FOLLOW UP: Return in about 3 months (around 07/30/2023) for routine chronic illness f/u.  Signed:  Arletha Lady, MD           04/30/2023

## 2023-05-01 LAB — PROTIME-INR
INR: 1.1 ratio — ABNORMAL HIGH (ref 0.8–1.0)
Prothrombin Time: 11.4 s (ref 9.6–13.1)

## 2023-05-03 ENCOUNTER — Encounter: Payer: Self-pay | Admitting: Family Medicine

## 2023-05-04 ENCOUNTER — Ambulatory Visit: Payer: Self-pay | Admitting: Family Medicine

## 2023-05-27 ENCOUNTER — Encounter: Payer: Self-pay | Admitting: Family Medicine

## 2023-05-27 ENCOUNTER — Ambulatory Visit (INDEPENDENT_AMBULATORY_CARE_PROVIDER_SITE_OTHER): Admitting: Family Medicine

## 2023-05-27 VITALS — BP 130/84 | HR 82 | Temp 99.6°F | Ht 63.0 in | Wt 248.0 lb

## 2023-05-27 DIAGNOSIS — Z79899 Other long term (current) drug therapy: Secondary | ICD-10-CM

## 2023-05-27 DIAGNOSIS — I82403 Acute embolism and thrombosis of unspecified deep veins of lower extremity, bilateral: Secondary | ICD-10-CM

## 2023-05-27 DIAGNOSIS — D509 Iron deficiency anemia, unspecified: Secondary | ICD-10-CM | POA: Diagnosis not present

## 2023-05-27 DIAGNOSIS — I10 Essential (primary) hypertension: Secondary | ICD-10-CM

## 2023-05-27 DIAGNOSIS — F5089 Other specified eating disorder: Secondary | ICD-10-CM | POA: Diagnosis not present

## 2023-05-27 DIAGNOSIS — F988 Other specified behavioral and emotional disorders with onset usually occurring in childhood and adolescence: Secondary | ICD-10-CM

## 2023-05-27 NOTE — Progress Notes (Signed)
 OFFICE VISIT  05/27/2023  CC:  Chief Complaint  Patient presents with   Medical Management of Chronic Issues    Patient is a 48 y.o. female who presents for follow-up mood disorder and adult ADD as well as follow-up hypertension and history of recurrent DVT. I last saw her 04/30/2023. A/P as of that visit: "#1 recurrent DVTs. Unprovoked.  She had an abnormality on her hypercoagulability panel with the hematologist. It was recommended that she be on anticoagulant indefinitely. She has chosen to stop anticoagulant since 04/14/2023 when a left leg Doppler showed no DVT. Her biggest reason for not wanting to be on anticoagulant is her inability to take NSAIDs for her knees when she is on an the anticoagulant. She understands the risks of recurrent clot without anticoagulant. She is very much wanting a PT/INR checked today--> ordered.   #2 bilateral knee osteoarthritis, end-stage. She and her orthopedist are planning for total knee replacement in the future. She is working on getting her weight down.  Until surgery she wants to use NSAIDs as needed because these help her very well. I do think she needs 3 more months out of work, letter written today to extend out of work time until 08/07/2023."  INTERIM HX: Maicee feels well. She has significant ice craving for the last month or so. She has no abnormal vaginal bleeding.  Her most recent period was at her normal interval and was very light for her. She does feel some hot flashes.  No melena or hematochezia. She is not currently taking anything with iron  supplement.  She is very happy to say that she is going back to work soon.  She will work for 6 weeks and then she will be getting a right total knee arthroplasty on 07/30/2023.  Pt states all is going well with the med at current dosing (methylphenidate  27 mg): much improved focus, concentration, task completion.  Less frustration, better multitasking, less impulsivity and restlessness.   Mood is stable. No side effects from the medication.  ROS as above, plus--> no fevers, no CP, no SOB, no wheezing, no cough, no dizziness, no HAs, no rashes.  No myalgias. No focal weakness, paresthesias, or tremors.  No acute vision or hearing abnormalities.  No dysuria or unusual/new urinary urgency or frequency.  No recent changes in lower legs. No n/v/d or abd pain.  No palpitations.    Past Medical History:  Diagnosis Date   Allergic rhinitis    Bilateral primary osteoarthritis of knee    severe   Carpal tunnel syndrome    right wrist, numb all the time   Cervical dysplasia    Chronic back pain    Degenerative lumbar spondylosis with grade 2 spondylolisthesis L4 on L5 with bilater pars defects L4.    COVID-19 virus infection 02/06/2020   DDD (degenerative disc disease), lumbar    MRI 10/2015: grade I (7mm) anterolisthesis L4 on L5, w/ disc herniation with encroachment on spinal nerves at L4-S1 levels.   Depression    DVT, lower extremity, recurrent (HCC)    09/2022, bilat->eliquis  indef   Edema of both lower extremities due to peripheral venous insufficiency    +varicose veins (hx of vein stripping 2009)   GERD (gastroesophageal reflux disease)    worsened 07/2018 likely associated with lap band being too constricting->increased pantoprazole  to 40mg  bid at that time.   History of chronic bronchitis    hx of when she was a smoker: ? mild intermittent asthma?--much better since stopped smoking  2018.   History of fracture of phalanx of finger    left, 4th finger distal phalanx   History of non anemic vitamin B12 deficiency    s/p lap band surgery per pt report.   Hyperlipidemia    Hypertension    Iron  deficiency anemia 01/2019   hemoccults ordered but never turned in (01/2019-01/2020). Iron  supp started 02/2020. Hemoccults neg 07/2020, plan for iron  infusion. 08/2020 EGD with SB bx normal, no source of IDA seen on colonoscopy. Iron  infusion 09/06/2020   LAP-BAND surgery status    Pt  getting lap band removed and is getting gastric sleave after.   Migraine syndrome    Ovarian cyst 10/2015   LEFT, 4 cm--noted on L spine MRI w/out contrast.  F/u u/s imaging showed simple cyst.   Right tibial fracture    Fragility fracture 2024/2025-->calcitonin + bisphosphonate per ortho   Spondylarthrosis    Tobacco dependence in remission    quit 2018    Past Surgical History:  Procedure Laterality Date   BACK SURGERY  2019   CARPAL TUNNEL RELEASE Left    CERVICAL CONE BIOPSY     COLONOSCOPY     2015 normal.  08/2020 (for IDA)->incomplete prep, no adenomas->rpt 5 yrs d/t incomplete prep.   ESOPHAGOGASTRODUODENOSCOPY N/A 04/27/2014   Procedure: ESOPHAGOGASTRODUODENOSCOPY (EGD);  Surgeon: Juanita Norlander, MD;  Location: [facility];  Service: General;  Laterality: N/A;   ESOPHAGOGASTRODUODENOSCOPY  08/29/2020   6 cm hiatal hernia noted, o/w normal, SB bx neg.  ?hernia contributing to pt's IDA?   GASTRIC BANDING PORT REVISION N/A 10/02/2014   Procedure: LAPROSCOPIC PLACEMENT OF LAP BAND PORT;  Surgeon: Ayesha Lente, MD;  Location: [facility];  Service: General;  Laterality: N/A;   LAPAROSCOPIC GASTRIC BANDING  03/2007   [facility] - [facility]; Dr Wandalee Gust Hipp   LAPAROSCOPIC REVISION OF GASTRIC BAND N/A 05/17/2012   Procedure: LAPAROSCOPIC REVISION OF SLIPPED GASTRIC BAND;  Surgeon: Quitman Bucy, MD;  Location: [facility];  Service: General;  Laterality: N/A;   LAPAROSCOPY N/A 04/30/2014   Procedure: DIAGNOSTIC LAPAROSCOPY WITH REMOVAL OF INFECTED LAP BAND PORT WITH DEBRIDEMENT SUBCUTANEOUS ABSCESS;  Surgeon: Aldean Hummingbird, MD;  Location: [facility];  Service: General;  Laterality: N/A;   VARICOSE VEIN SURGERY Bilateral 2009    Outpatient Medications Prior to Visit  Medication Sig Dispense Refill   albuterol  (VENTOLIN  HFA) 108 (90 Base) MCG/ACT inhaler INHALE 2 PUFFS INTO THE LUNGS EVERY 4 (FOUR) HOURS AS NEEDED FOR WHEEZING OR SHORTNESS OF BREATH. 8 g 0   alendronate  (FOSAMAX) 10 MG tablet Take 10 mg by mouth daily before breakfast. Take with a full glass of water on an empty stomach.     baclofen (LIORESAL) 20 MG tablet Take 10-20 mg by mouth every 8 (eight) hours.     calcitonin, salmon, (MIACALCIN/FORTICAL) 200 UNIT/ACT nasal spray Place 1 spray into alternate nostrils daily.     cetirizine (ZYRTEC) 10 MG tablet Take 20 mg by mouth daily as needed for allergies (allergies).     diclofenac  (VOLTAREN ) 75 MG EC tablet Take 1 tablet by mouth daily.     DULoxetine  (CYMBALTA ) 60 MG capsule Take 1 capsule (60 mg total) by mouth daily. 90 capsule 3   furosemide  (LASIX ) 20 MG tablet 1 tab po every day as needed for legs swelling 30 tablet 0   gabapentin  (NEURONTIN ) 800 MG tablet Take 800 mg by mouth in the morning, at noon, in the evening, and at bedtime.  6  lisinopril  (ZESTRIL ) 40 MG tablet Take 1 tablet (40 mg total) by mouth daily. 90 tablet 3   methylphenidate  27 MG PO CR tablet Take 1 tablet (27 mg total) by mouth every morning. 30 tablet 0   Multiple Vitamin (MULITIVITAMIN WITH MINERALS) TABS Take 1 tablet by mouth daily.     NARCAN 4 MG/0.1ML LIQD nasal spray kit      Oxycodone  HCl 10 MG TABS Take 10 mg by mouth every 6 (six) hours. Do not fill until 05/26/2022 for 30 days.     pantoprazole  (PROTONIX ) 40 MG tablet Take 1 tablet (40 mg total) by mouth 2 (two) times daily. 180 tablet 3   rizatriptan  (MAXALT -MLT) 10 MG disintegrating tablet TAKE 1 TABLET BY MOUTH AS NEEDED FOR MIGRAINE. MAY REPEAT IN 2 HOURS IF NEEDED 10 tablet 0   SODIUM FLUORIDE, DENTAL RINSE, 0.2 % SOLN Take by mouth.     No facility-administered medications prior to visit.    Allergies  Allergen Reactions   Meperidine Hives and Nausea And Vomiting   Vancomycin  Anaphylaxis and Itching    Face/back warm and red.  Pt lips and tongue swelled, hives   Demerol Hives and Nausea And Vomiting   Microplegia Msa-Msg  [Cardioplegia Del Nido Formula] Other (See Comments)    Other reaction(s):  headache   Monosodium Glutamate Nausea And Vomiting and Other (See Comments)    migraines    Tape Rash    Plastic tape    Review of Systems As per HPI  PE:    05/27/2023    2:34 PM 04/30/2023    2:57 PM 04/30/2023    2:45 PM  Vitals with BMI  Height 5\' 3"   5\' 3"   Weight 248 lbs  252 lbs 6 oz  BMI 43.94  44.72  Systolic 130 150 045  Diastolic 84 86 92  Pulse 82  88     Physical Exam  Gen: Alert, well appearing.  Patient is oriented to person, place, time, and situation. AFFECT: pleasant, lucid thought and speech. No further exam today  LABS:  Last CBC Lab Results  Component Value Date   WBC 8.1 10/22/2022   HGB 11.6 (L) 10/22/2022   HCT 36.9 10/22/2022   MCV 85.2 10/22/2022   MCH 26.8 10/22/2022   RDW 14.1 10/22/2022   PLT 220 10/22/2022   Lab Results  Component Value Date   IRON  49 06/06/2022   TIBC 333 06/06/2022   FERRITIN 21 06/06/2022   Last metabolic panel Lab Results  Component Value Date   GLUCOSE 93 10/22/2022   NA 140 10/22/2022   K 3.6 10/22/2022   CL 101 10/22/2022   CO2 32 10/22/2022   BUN 11 10/22/2022   CREATININE 0.71 10/22/2022   GFRNONAA >60 10/22/2022   CALCIUM 9.4 10/22/2022   PROT 7.1 10/22/2022   ALBUMIN 3.8 10/22/2022   BILITOT 0.3 10/22/2022   ALKPHOS 57 10/22/2022   AST 14 (L) 10/22/2022   ALT 12 10/22/2022   ANIONGAP 7 10/22/2022   Last lipids Lab Results  Component Value Date   CHOL 212 (H) 06/06/2022   HDL 58.00 06/06/2022   LDLCALC 135 (H) 06/06/2022   TRIG 96.0 06/06/2022   CHOLHDL 4 06/06/2022   Last thyroid  functions Lab Results  Component Value Date   TSH 1.60 06/06/2022    Last vitamin B12 and Folate Lab Results  Component Value Date   VITAMINB12 828 03/21/2016   IMPRESSION AND PLAN:  #1 ice craving: History of iron  deficiency  anemia.  Hemoccults negative and 2022 GI workup unrevealing.  Does not tolerate oral iron  other than the content in a multivitamin with iron . She has had to have iron   infusions in the past. She does not have any abnormal vaginal bleeding. Check CBC and iron  panel today.  #2 hypertension, well-controlled on lisinopril  40 mg a day. Electrolytes and creatinine monitoring today.  3.Recurrent DVTs. Unprovoked.  She had an abnormality on her hypercoagulability panel with the hematologist. It was recommended that she be on anticoagulant indefinitely. She has chosen to stop anticoagulant since 04/14/2023 when a left leg Doppler showed no DVT. Her biggest reason for not wanting to be on anticoagulant is her inability to take NSAIDs for her knees when she is on an the anticoagulant. She understands the risks of recurrent clot without anticoagulant.  #4 adult ADD. She does get some improvement on methylphenidate  CR 27 mg a day. She agrees that she could likely benefit from a higher dose but wants to hold off at this time.  She will let me know at the time of next refill if she wants to go up on the dose and we can adjust follow-up appropriately.  #5 bilateral severe knee DJD. Plan for right total knee arthroplasty 07/30/2023. She is taking diclofenac  75 mg twice daily and usually a 220 mg Aleve between dosing.  An After Visit Summary was printed and given to the patient.  FOLLOW UP: Return in about 3 months (around 08/27/2023) for routine chronic illness f/u. Next CPE 3 months Signed:  Arletha Lady, MD           05/27/2023

## 2023-05-28 ENCOUNTER — Ambulatory Visit: Payer: Self-pay | Admitting: Family Medicine

## 2023-05-28 ENCOUNTER — Encounter (INDEPENDENT_AMBULATORY_CARE_PROVIDER_SITE_OTHER): Payer: Self-pay | Admitting: Vascular Surgery

## 2023-05-28 ENCOUNTER — Telehealth: Payer: Self-pay

## 2023-05-28 DIAGNOSIS — D509 Iron deficiency anemia, unspecified: Secondary | ICD-10-CM

## 2023-05-28 LAB — CBC WITH DIFFERENTIAL/PLATELET
Basophils Absolute: 0.1 10*3/uL (ref 0.0–0.1)
Basophils Relative: 1.5 % (ref 0.0–3.0)
Eosinophils Absolute: 0.2 10*3/uL (ref 0.0–0.7)
Eosinophils Relative: 2.4 % (ref 0.0–5.0)
HCT: 33.7 % — ABNORMAL LOW (ref 36.0–46.0)
Hemoglobin: 10.3 g/dL — ABNORMAL LOW (ref 12.0–15.0)
Lymphocytes Relative: 22.6 % (ref 12.0–46.0)
Lymphs Abs: 1.6 10*3/uL (ref 0.7–4.0)
MCHC: 30.5 g/dL (ref 30.0–36.0)
MCV: 72 fl — ABNORMAL LOW (ref 78.0–100.0)
Monocytes Absolute: 0.7 10*3/uL (ref 0.1–1.0)
Monocytes Relative: 9.2 % (ref 3.0–12.0)
Neutro Abs: 4.6 10*3/uL (ref 1.4–7.7)
Neutrophils Relative %: 64.3 % (ref 43.0–77.0)
Platelets: 281 10*3/uL (ref 150.0–400.0)
RBC: 4.68 Mil/uL (ref 3.87–5.11)
RDW: 18.7 % — ABNORMAL HIGH (ref 11.5–15.5)
WBC: 7.1 10*3/uL (ref 4.0–10.5)

## 2023-05-28 LAB — COMPREHENSIVE METABOLIC PANEL WITH GFR
ALT: 15 U/L (ref 0–35)
AST: 21 U/L (ref 0–37)
Albumin: 4.1 g/dL (ref 3.5–5.2)
Alkaline Phosphatase: 52 U/L (ref 39–117)
BUN: 7 mg/dL (ref 6–23)
CO2: 32 meq/L (ref 19–32)
Calcium: 9.5 mg/dL (ref 8.4–10.5)
Chloride: 99 meq/L (ref 96–112)
Creatinine, Ser: 0.58 mg/dL (ref 0.40–1.20)
GFR: 106.35 mL/min (ref >60.00)
Glucose, Bld: 56 mg/dL — ABNORMAL LOW (ref 70–99)
Potassium: 3.8 meq/L (ref 3.5–5.1)
Sodium: 137 meq/L (ref 135–145)
Total Bilirubin: 0.5 mg/dL (ref 0.2–1.2)
Total Protein: 7.2 g/dL (ref 6.0–8.3)

## 2023-05-28 NOTE — Telephone Encounter (Signed)
 Received surgical clearance form. Placed in s drive folder

## 2023-05-28 NOTE — Telephone Encounter (Signed)
 Printed and placed on PCP desk to review and sign, if appropriate. Pt was last seen 05/27/23

## 2023-05-29 ENCOUNTER — Other Ambulatory Visit: Payer: Self-pay

## 2023-05-29 NOTE — Telephone Encounter (Signed)
 Forms completed by PCP and faxed back with recent o/v note

## 2023-05-30 LAB — IRON,TIBC AND FERRITIN PANEL
%SAT: 5 % — ABNORMAL LOW (ref 16–45)
Ferritin: 9 ng/mL — ABNORMAL LOW (ref 16–232)
Iron: 19 ug/dL — ABNORMAL LOW (ref 40–190)
TIBC: 408 ug/dL (ref 250–450)

## 2023-05-30 LAB — DRUG MONITORING PANEL 376104, URINE
Amphetamines: NEGATIVE ng/mL (ref <500)
Barbiturates: NEGATIVE ng/mL (ref <300)
Benzodiazepines: NEGATIVE ng/mL (ref <100)
Cocaine Metabolite: NEGATIVE ng/mL (ref <150)
Codeine: NEGATIVE ng/mL (ref <50)
Desmethyltramadol: NEGATIVE ng/mL (ref <100)
Hydrocodone: NEGATIVE ng/mL (ref <50)
Hydromorphone: NEGATIVE ng/mL (ref <50)
Morphine: NEGATIVE ng/mL (ref <50)
Norhydrocodone: NEGATIVE ng/mL (ref <50)
Noroxycodone: 1030 ng/mL — ABNORMAL HIGH (ref <50)
Opiates: NEGATIVE ng/mL (ref <100)
Oxycodone: 129 ng/mL — ABNORMAL HIGH (ref <50)
Oxycodone: POSITIVE ng/mL — AB (ref <100)
Oxymorphone: 373 ng/mL — ABNORMAL HIGH (ref <50)
Tramadol: NEGATIVE ng/mL (ref <100)

## 2023-05-30 LAB — DM TEMPLATE

## 2023-06-04 ENCOUNTER — Ambulatory Visit (INDEPENDENT_AMBULATORY_CARE_PROVIDER_SITE_OTHER): Admitting: Vascular Surgery

## 2023-06-04 ENCOUNTER — Encounter (INDEPENDENT_AMBULATORY_CARE_PROVIDER_SITE_OTHER): Payer: Self-pay | Admitting: Vascular Surgery

## 2023-06-04 VITALS — BP 137/78 | HR 90 | Resp 18 | Ht 63.0 in | Wt 242.8 lb

## 2023-06-04 DIAGNOSIS — Z86718 Personal history of other venous thrombosis and embolism: Secondary | ICD-10-CM | POA: Diagnosis not present

## 2023-06-04 DIAGNOSIS — G894 Chronic pain syndrome: Secondary | ICD-10-CM

## 2023-06-04 DIAGNOSIS — I10 Essential (primary) hypertension: Secondary | ICD-10-CM

## 2023-06-04 NOTE — Progress Notes (Signed)
 Subjective:    Patient ID: Samantha Doyle, female    DOB: 05/10/1975, 48 y.o.   MRN: [id number] Chief Complaint  Patient presents with   New Patient (Initial Visit)    Ref Clyda Dark consult hx of dvt    Samantha Doyle is a 48 yo female who presents to clinic for discussion concerning Inferior Vena Cava Filter placement prior to right total knee arthroplasty.  Patient was referred to us  by Dr. Barron Lien of orthopedics.  Patient has a prior history of bilateral lower extremity DVTs.  She spent 6 months on anticoagulation for this and then terminated the anticoagulation after repeat bilateral lower extremity ultrasound showed no DVTs.  Patient is at higher risk for formation of DVTs with having right knee arthroplasty and IVC filter placement was recommended by orthopedics.  We are here today to discuss the need, procedure and scheduling of this procedure.    Review of Systems  Constitutional: Negative.   Musculoskeletal:  Positive for back pain, gait problem and joint swelling.  All other systems reviewed and are negative.      Objective:    Physical Exam Vitals reviewed.  Constitutional:      Appearance: Normal appearance. She is obese.  HENT:     Head: Normocephalic.  Eyes:     Pupils: Pupils are equal, round, and reactive to light.  Cardiovascular:     Rate and Rhythm: Normal rate and regular rhythm.     Pulses: Normal pulses.     Heart sounds: Normal heart sounds.  Pulmonary:     Effort: Pulmonary effort is normal.     Breath sounds: Normal breath sounds.  Abdominal:     General: Abdomen is flat. Bowel sounds are normal.     Palpations: Abdomen is soft.  Musculoskeletal:        General: Tenderness present.     Cervical back: Normal range of motion.     Comments: Chronic knee pain bilaterally right greater than left.  Skin:    General: Skin is warm and dry.     Capillary Refill: Capillary refill takes 2 to 3 seconds.  Neurological:     General: No focal  deficit present.     Mental Status: She is alert and oriented to person, place, and time. Mental status is at baseline.  Psychiatric:        Mood and Affect: Mood normal.        Behavior: Behavior normal.        Thought Content: Thought content normal.        Judgment: Judgment normal.     BP 137/78   Pulse 90   Resp 18   Ht 5\' 3"  (1.6 m)   Wt 242 lb 12.8 oz (110.1 kg)   BMI 43.01 kg/m   Past Medical History:  Diagnosis Date   Allergic rhinitis    Bilateral primary osteoarthritis of knee    severe   Carpal tunnel syndrome    right wrist, numb all the time   Cervical dysplasia    Chronic back pain    Degenerative lumbar spondylosis with grade 2 spondylolisthesis L4 on L5 with bilater pars defects L4.    COVID-19 virus infection 02/06/2020   DDD (degenerative disc disease), lumbar    MRI 10/2015: grade I (7mm) anterolisthesis L4 on L5, w/ disc herniation with encroachment on spinal nerves at L4-S1 levels.   Depression    DVT, lower extremity, recurrent (HCC)    09/2022, bilat->eliquis  indef  Edema of both lower extremities due to peripheral venous insufficiency    +varicose veins (hx of vein stripping 2009)   GERD (gastroesophageal reflux disease)    worsened 07/2018 likely associated with lap band being too constricting->increased pantoprazole  to 40mg  bid at that time.   History of chronic bronchitis    hx of when she was a smoker: ? mild intermittent asthma?--much better since stopped smoking 2018.   History of fracture of phalanx of finger    left, 4th finger distal phalanx   History of non anemic vitamin B12 deficiency    s/p lap band surgery per pt report.   Hyperlipidemia    Hypertension    Iron  deficiency anemia 01/2019   hemoccults ordered but never turned in (01/2019-01/2020). Iron  supp started 02/2020. Hemoccults neg 07/2020, plan for iron  infusion. 08/2020 EGD with SB bx normal, no source of IDA seen on colonoscopy. Iron  infusion 09/06/2020   LAP-BAND surgery status     Pt getting lap band removed and is getting gastric sleave after.   Migraine syndrome    Ovarian cyst 10/2015   LEFT, 4 cm--noted on L spine MRI w/out contrast.  F/u u/s imaging showed simple cyst.   Right tibial fracture    Fragility fracture 2024/2025-->calcitonin + bisphosphonate per ortho   Spondylarthrosis    Tobacco dependence in remission    quit 2018    Social History   Socioeconomic History   Marital status: Married    Spouse name: Not on file   Number of children: Not on file   Years of education: Not on file   Highest education level: Not on file  Occupational History   Not on file  Tobacco Use   Smoking status: Former    Current packs/day: 0.25    Average packs/day: 0.3 packs/day for 5.0 years (1.3 ttl pk-yrs)    Types: Cigarettes   Smokeless tobacco: Never  Vaping Use   Vaping status: Never Used  Substance and Sexual Activity   Alcohol use: No   Drug use: No   Sexual activity: Yes    Birth control/protection: None  Other Topics Concern   Not on file  Social History Narrative   Married, no children.   Occup: Retail banker   Tobacco: 6 pack-yr hx--quit with wellbutrin .   No alcohol or drugs.   Social Drivers of Corporate investment banker Strain: Not on file  Food Insecurity: No Food Insecurity (01/09/2022)   Hunger Vital Sign    Worried About Running Out of Food in the Last Year: Never true    Ran Out of Food in the Last Year: Never true  Transportation Needs: No Transportation Needs (01/09/2022)   PRAPARE - Administrator, Civil Service (Medical): No    Lack of Transportation (Non-Medical): No  Physical Activity: Not on file  Stress: Not on file  Social Connections: Unknown (05/19/2021)   Received from [facility]   Social Network    Social Network: Not on file  Intimate Partner Violence: Not At Risk (10/22/2022)   Humiliation, Afraid, Rape, and Kick questionnaire    Fear of Current or Ex-Partner: No     Emotionally Abused: No    Physically Abused: No    Sexually Abused: No    Past Surgical History:  Procedure Laterality Date   BACK SURGERY  2019   CARPAL TUNNEL RELEASE Left    CERVICAL CONE BIOPSY     COLONOSCOPY     2015 normal.  08/2020 (for IDA)->incomplete prep, no adenomas->rpt 5 yrs d/t incomplete prep.   ESOPHAGOGASTRODUODENOSCOPY N/A 04/27/2014   Procedure: ESOPHAGOGASTRODUODENOSCOPY (EGD);  Surgeon: Juanita Norlander, MD;  Location: [facility];  Service: General;  Laterality: N/A;   ESOPHAGOGASTRODUODENOSCOPY  08/29/2020   6 cm hiatal hernia noted, o/w normal, SB bx neg.  ?hernia contributing to pt's IDA?   GASTRIC BANDING PORT REVISION N/A 10/02/2014   Procedure: LAPROSCOPIC PLACEMENT OF LAP BAND PORT;  Surgeon: Ayesha Lente, MD;  Location: [facility];  Service: General;  Laterality: N/A;   LAPAROSCOPIC GASTRIC BANDING  03/2007   [facility]; Dr Wandalee Gust Hipp   LAPAROSCOPIC REVISION OF GASTRIC BAND N/A 05/17/2012   Procedure: LAPAROSCOPIC REVISION OF SLIPPED GASTRIC BAND;  Surgeon: Quitman Bucy, MD;  Location: [facility];  Service: General;  Laterality: N/A;   LAPAROSCOPY N/A 04/30/2014   Procedure: DIAGNOSTIC LAPAROSCOPY WITH REMOVAL OF INFECTED LAP BAND PORT WITH DEBRIDEMENT SUBCUTANEOUS ABSCESS;  Surgeon: Aldean Hummingbird, MD;  Location: [facility];  Service: General;  Laterality: N/A;   VARICOSE VEIN SURGERY Bilateral 2009    Family History  Problem Relation Age of Onset   Alcohol abuse Mother    Drug abuse Mother    Arthritis Mother    Hyperlipidemia Mother    Heart disease Mother    Hypertension Mother    Non-Hodgkin's lymphoma Mother    Lung cancer Mother    Alcohol abuse Father    Arthritis Father    Hyperlipidemia Father    Heart disease Father    Stroke Father    Hypertension Father    Mental illness Father    Diabetes Father    Hyperlipidemia Sister    Hypertension Sister    Arthritis Maternal Grandmother    Cancer Maternal Grandmother     Hypertension Maternal Grandmother    Arthritis Maternal Grandfather    Arthritis Paternal Grandmother    Cancer Paternal Grandmother    Hyperlipidemia Paternal Grandmother    Heart disease Paternal Grandmother    Hypertension Paternal Grandmother    Diabetes Paternal Grandmother    Arthritis Paternal Grandfather    Hyperlipidemia Paternal Grandfather    Heart disease Paternal Grandfather    Stroke Paternal Grandfather    Hypertension Paternal Grandfather     Allergies  Allergen Reactions   Meperidine Hives and Nausea And Vomiting   Vancomycin  Anaphylaxis and Itching    Face/back warm and red.  Pt lips and tongue swelled, hives   Demerol Hives and Nausea And Vomiting   Microplegia Msa-Msg  [Cardioplegia Del Nido Formula] Other (See Comments)    Other reaction(s): headache   Monosodium Glutamate Nausea And Vomiting and Other (See Comments)    migraines    Tape Rash    Plastic tape       Latest Ref Rng & Units 05/27/2023    3:01 PM 10/22/2022    2:03 PM 09/16/2022   10:13 PM  CBC  WBC 4.0 - 10.5 K/uL 7.1  8.1  10.0   Hemoglobin 12.0 - 15.0 g/dL 16.1  09.6  04.5   Hematocrit 36.0 - 46.0 % 33.7  36.9  38.1   Platelets 150.0 - 400.0 K/uL 281.0  220  260        CMP     Component Value Date/Time   NA 137 05/27/2023 1501   NA 140 08/19/2020 0000   K 3.8 05/27/2023 1501   CL 99 05/27/2023 1501   CO2 32 05/27/2023 1501   GLUCOSE 56 (  L) 05/27/2023 1501   BUN 7 05/27/2023 1501   BUN 13 08/19/2020 0000   CREATININE 0.58 05/27/2023 1501   CREATININE 0.71 10/22/2022 1403   CREATININE 0.61 08/07/2017 1508   CALCIUM 9.5 05/27/2023 1501   PROT 7.2 05/27/2023 1501   ALBUMIN 4.1 05/27/2023 1501   AST 21 05/27/2023 1501   AST 14 (L) 10/22/2022 1403   ALT 15 05/27/2023 1501   ALT 12 10/22/2022 1403   ALKPHOS 52 05/27/2023 1501   BILITOT 0.5 05/27/2023 1501   BILITOT 0.3 10/22/2022 1403   GFR 106.35 05/27/2023 1501   GFRNONAA >60 10/22/2022 1403     No results  found.     Assessment & Plan:   1. History of DVT (deep vein thrombosis) (Primary) Patient has a history of bilateral lower extremity DVTs.  She was on anticoagulation for 6 months and took herself off after follow-up visit with ultrasound that showed DVTs have resolved.  She has chronic history of right knee pain for which she was worked up and now plans to undergo right total knee arthroplasty.  Due to the history of DVTs patient will need IVC filter placement 1 week prior to surgery.  Patient is scheduled for right total knee arthroplasty on July 30, 2023.  I discussed in detail today the need for IVC filter prior to surgery.  We discussed in detail the procedure itself, benefits, risk, complications.  Samantha Doyle verbalized understanding wishes to proceed as soon as possible.  She was informed that our office will call and schedule that procedure for 1 week prior to her knee surgery.  She again verbalized understanding.  We also discussed removal of the IVC filter which will be approximately 6 to 8 weeks after knee replacement.  At that time she wishes to have ultrasounds of her bilateral lower extremities prior to this filter removal to verify DVTs and the need for anticoagulation therapy.  We will discuss this at that time.   2. Essential (primary) hypertension Continue antihypertensive medications as already ordered, these medications have been reviewed and there are no changes at this time.  3. Chronic pain syndrome Continue pain medications as already ordered, these medications have been reviewed and there are no changes at this time.   Current Outpatient Medications on File Prior to Visit  Medication Sig Dispense Refill   albuterol  (VENTOLIN  HFA) 108 (90 Base) MCG/ACT inhaler INHALE 2 PUFFS INTO THE LUNGS EVERY 4 (FOUR) HOURS AS NEEDED FOR WHEEZING OR SHORTNESS OF BREATH. 8 g 0   baclofen (LIORESAL) 20 MG tablet Take 10-20 mg by mouth every 8 (eight) hours.     cetirizine (ZYRTEC) 10 MG  tablet Take 20 mg by mouth daily as needed for allergies (allergies).     diclofenac  (VOLTAREN ) 75 MG EC tablet Take 1 tablet by mouth daily.     DULoxetine  (CYMBALTA ) 60 MG capsule Take 1 capsule (60 mg total) by mouth daily. 90 capsule 3   gabapentin  (NEURONTIN ) 800 MG tablet Take 800 mg by mouth in the morning, at noon, in the evening, and at bedtime.  6   lisinopril  (ZESTRIL ) 40 MG tablet Take 1 tablet (40 mg total) by mouth daily. 90 tablet 3   methylphenidate  27 MG PO CR tablet Take 1 tablet (27 mg total) by mouth every morning. 30 tablet 0   Multiple Vitamin (MULITIVITAMIN WITH MINERALS) TABS Take 1 tablet by mouth daily.     NARCAN 4 MG/0.1ML LIQD nasal spray kit      Oxycodone   HCl 10 MG TABS Take 10 mg by mouth every 6 (six) hours. Do not fill until 05/26/2022 for 30 days.     pantoprazole  (PROTONIX ) 40 MG tablet Take 1 tablet (40 mg total) by mouth 2 (two) times daily. 180 tablet 3   rizatriptan  (MAXALT -MLT) 10 MG disintegrating tablet TAKE 1 TABLET BY MOUTH AS NEEDED FOR MIGRAINE. MAY REPEAT IN 2 HOURS IF NEEDED 10 tablet 0   SODIUM FLUORIDE, DENTAL RINSE, 0.2 % SOLN Take by mouth.     alendronate (FOSAMAX) 10 MG tablet Take 10 mg by mouth daily before breakfast. Take with a full glass of water on an empty stomach.     calcitonin, salmon, (MIACALCIN/FORTICAL) 200 UNIT/ACT nasal spray Place 1 spray into alternate nostrils daily.     furosemide  (LASIX ) 20 MG tablet 1 tab po every day as needed for legs swelling 30 tablet 0   No current facility-administered medications on file prior to visit.    There are no Patient Instructions on file for this visit. No follow-ups on file.   Annamaria Barrette, NP

## 2023-06-26 ENCOUNTER — Telehealth (INDEPENDENT_AMBULATORY_CARE_PROVIDER_SITE_OTHER): Payer: Self-pay

## 2023-06-26 NOTE — Telephone Encounter (Signed)
 I attempted to contact the patient to schedule a IVC filter placement with Dr. Vonna Guardian. A message was left for a return call.

## 2023-06-29 ENCOUNTER — Telehealth (INDEPENDENT_AMBULATORY_CARE_PROVIDER_SITE_OTHER): Payer: Self-pay

## 2023-06-29 NOTE — Telephone Encounter (Signed)
 Spoke with the patient and she is scheduled with Dr. Marea for a IVC filter placement on 07/23/23 with a 12:00 pm arrival time to the [facility]. Pre-procedure instructions were discussed and will be sent to Mychart and mailed.

## 2023-07-02 ENCOUNTER — Encounter: Payer: Self-pay | Admitting: Family Medicine

## 2023-07-02 MED ORDER — METHYLPHENIDATE HCL ER (OSM) 36 MG PO TBCR: 36.0000 mg | EXTENDED_RELEASE_TABLET | Freq: Every day | ORAL | 0 refills | Status: DC

## 2023-07-02 NOTE — Telephone Encounter (Signed)
 Ok, 36mg  sent

## 2023-07-03 NOTE — Telephone Encounter (Signed)
 Pt advised.

## 2023-07-15 IMAGING — DX DG CHEST 2V
2 series · 2 of 2 positions shown · non-contrast
Comparison: 04/19/2014

CLINICAL DATA: Shortness of breath, cough, fever, chills

EXAM:
CHEST - 2 VIEW

[chest pa]
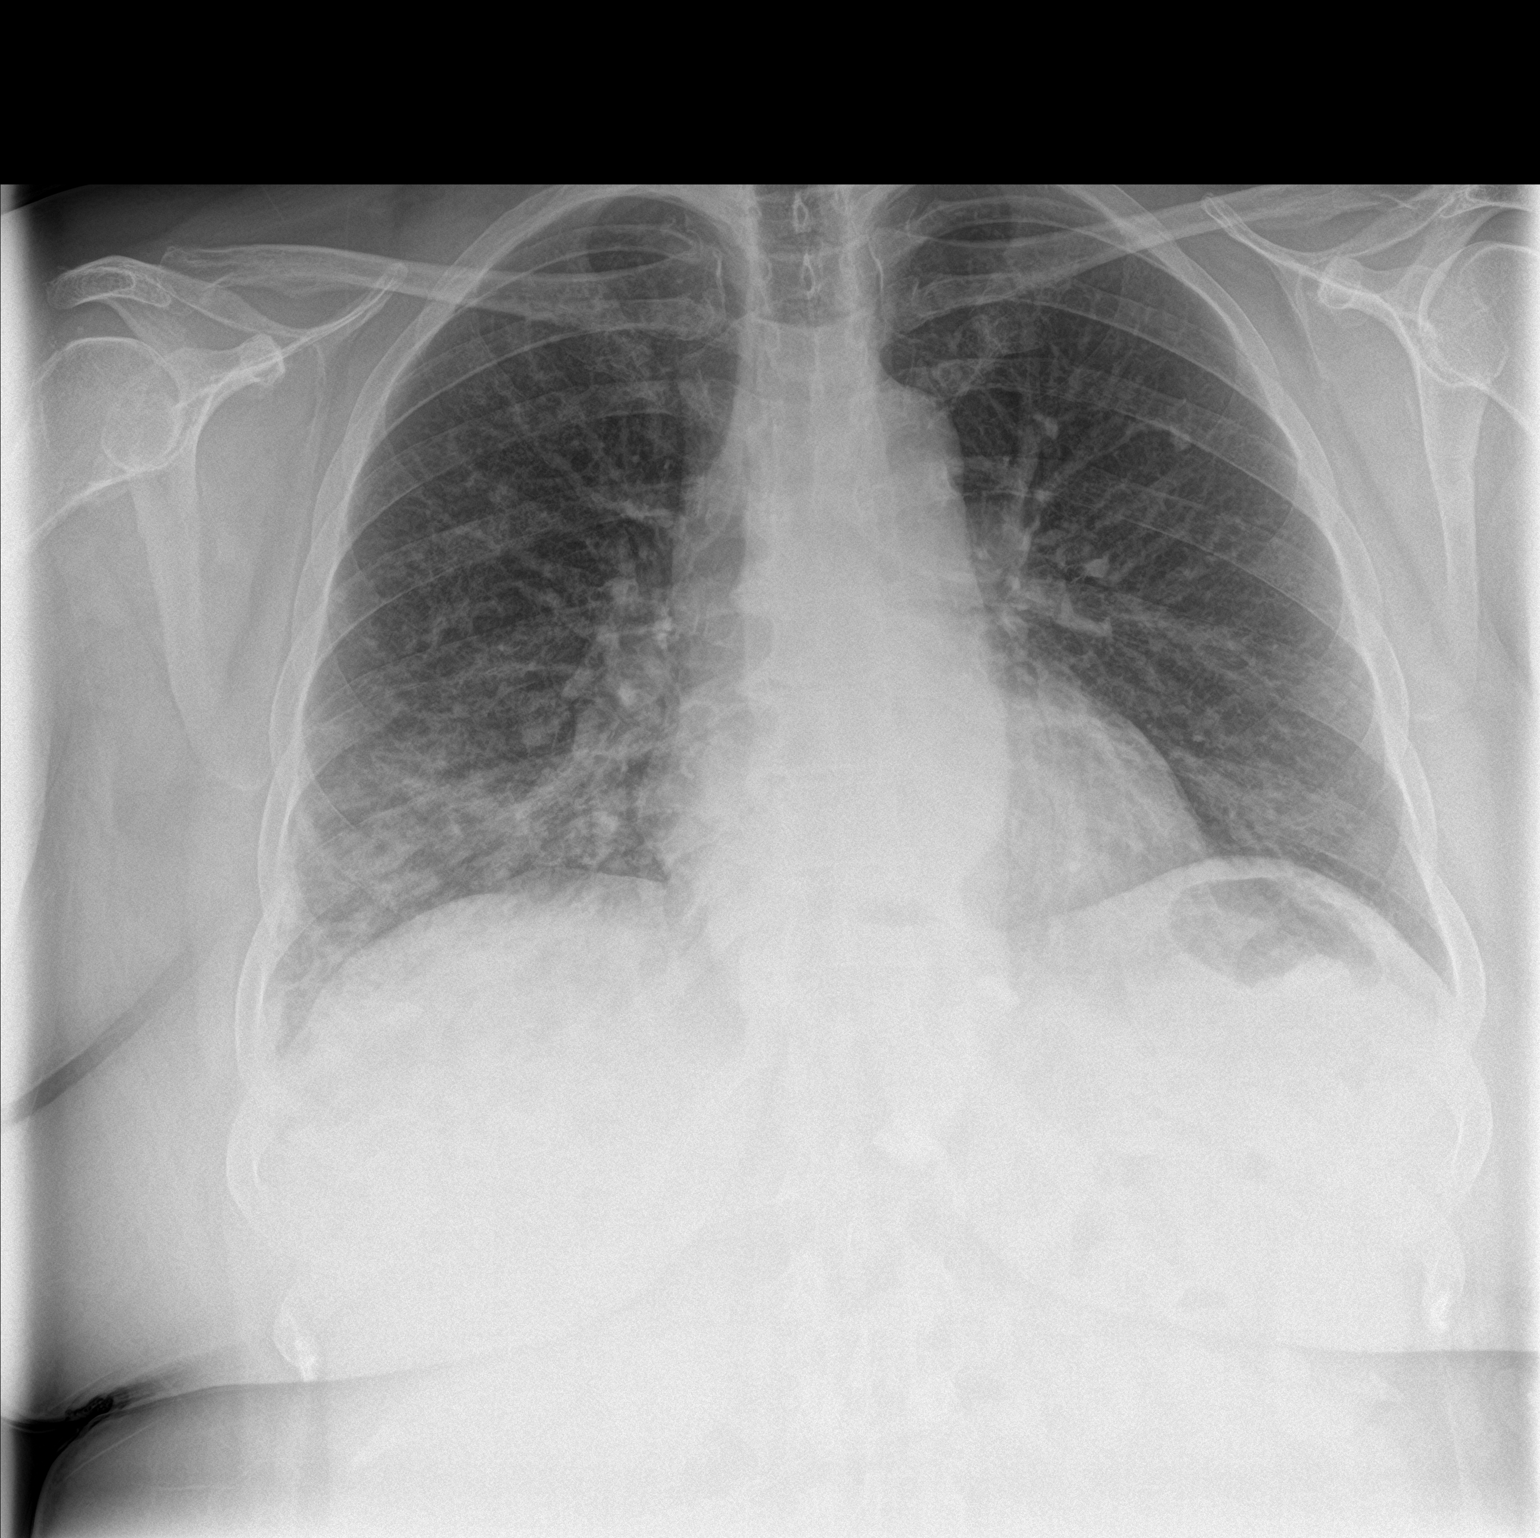

[chest lat]
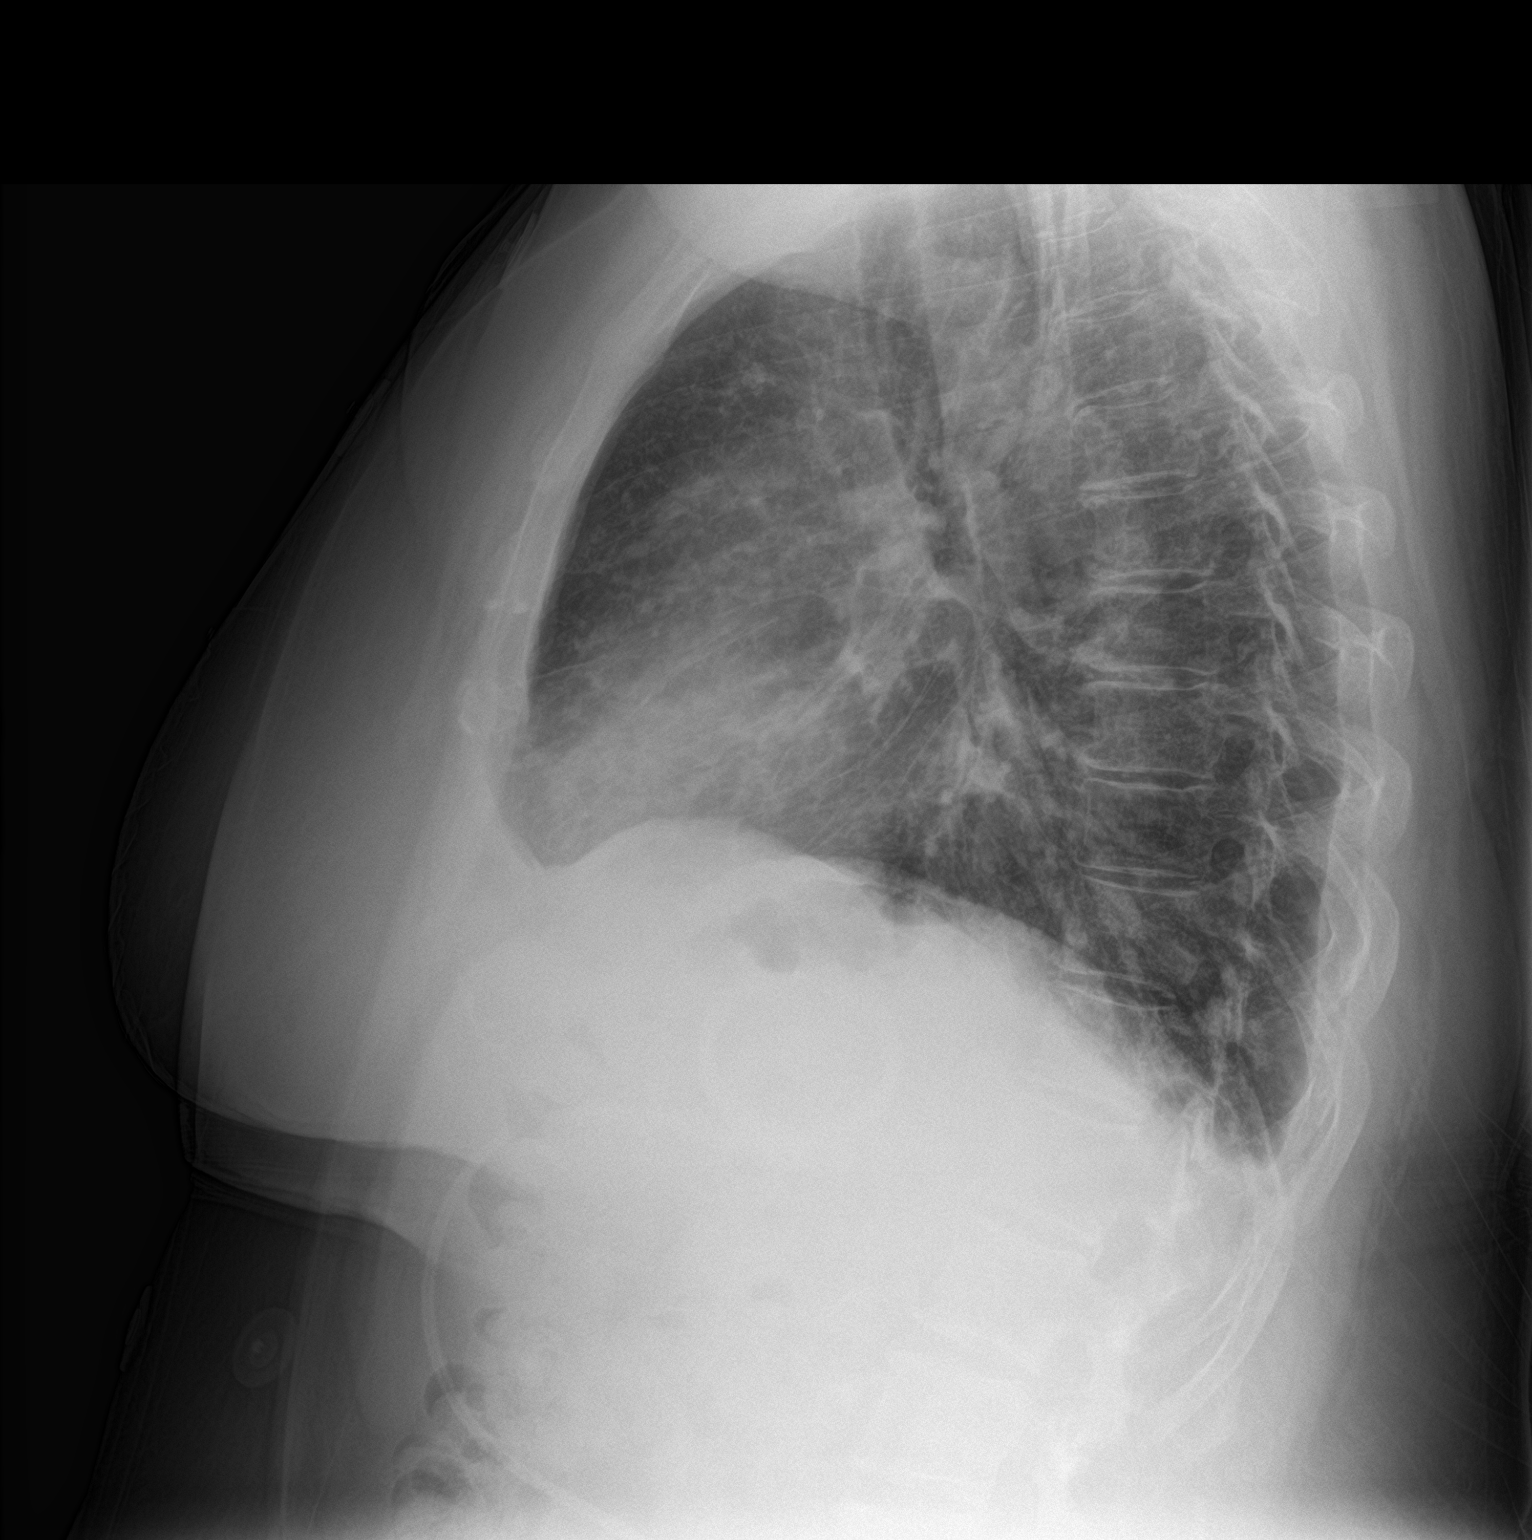

[2 of 2 positions shown; findings below may reference images not displayed]

FINDINGS: Normal heart size, mediastinal contours, and pulmonary vascularity.

RIGHT basilar infiltrate consistent with pneumonia.

Remaining lungs clear.

No pleural effusion or pneumothorax.

No acute osseous findings.
IMPRESSION: RIGHT basilar infiltrate consistent with pneumonia.

## 2023-07-17 ENCOUNTER — Telehealth (INDEPENDENT_AMBULATORY_CARE_PROVIDER_SITE_OTHER): Payer: Self-pay

## 2023-07-17 NOTE — Telephone Encounter (Signed)
 Patient called stating she needed to reschedule her IVC filter placement as her surgery has been rescheduled to 08/31/23. Patient rescheduled to 08/24/23 with a 6:45 am arrival time to the [facility]. Pre-procedure instructions were discussed and will be sent to Mychart and mailed.

## 2023-07-22 ENCOUNTER — Telehealth (INDEPENDENT_AMBULATORY_CARE_PROVIDER_SITE_OTHER): Payer: Self-pay

## 2023-07-22 ENCOUNTER — Other Ambulatory Visit: Payer: Self-pay | Admitting: Orthopedic Surgery

## 2023-07-22 NOTE — Telephone Encounter (Signed)
 Patient called in to reschedule her IVC filter placement, which was just rescheduled from 07/23/23 to 08/24/23 to 07/27/23 at the [facility]. Patient was informed and stated she had all of the other information.

## 2023-07-23 ENCOUNTER — Ambulatory Visit: Admitting: Family Medicine

## 2023-07-23 DIAGNOSIS — I82409 Acute embolism and thrombosis of unspecified deep veins of unspecified lower extremity: Secondary | ICD-10-CM

## 2023-07-24 ENCOUNTER — Other Ambulatory Visit: Payer: Self-pay

## 2023-07-24 ENCOUNTER — Encounter
Admission: RE | Admit: 2023-07-24 | Discharge: 2023-07-24 | Disposition: A | Discharge status: Home / Self Care | Source: Ambulatory Visit | Attending: Orthopedic Surgery | Admitting: Orthopedic Surgery

## 2023-07-24 VITALS — BP 132/87 | HR 67 | Resp 16 | Ht 63.0 in | Wt 244.0 lb

## 2023-07-24 DIAGNOSIS — Z01810 Encounter for preprocedural cardiovascular examination: Secondary | ICD-10-CM | POA: Diagnosis not present

## 2023-07-24 DIAGNOSIS — Z01818 Encounter for other preprocedural examination: Secondary | ICD-10-CM | POA: Insufficient documentation

## 2023-07-24 DIAGNOSIS — Z01812 Encounter for preprocedural laboratory examination: Secondary | ICD-10-CM | POA: Diagnosis present

## 2023-07-24 LAB — URINALYSIS, ROUTINE W REFLEX MICROSCOPIC
Bilirubin Urine: NEGATIVE
Glucose, UA: NEGATIVE mg/dL
Hgb urine dipstick: NEGATIVE
Ketones, ur: NEGATIVE mg/dL
Leukocytes,Ua: NEGATIVE
Nitrite: NEGATIVE
Protein, ur: NEGATIVE mg/dL
Specific Gravity, Urine: 1.012 (ref 1.005–1.030)
pH: 7 (ref 5.0–8.0)

## 2023-07-24 LAB — COMPREHENSIVE METABOLIC PANEL WITH GFR
ALT: 17 U/L (ref 0–44)
AST: 22 U/L (ref 15–41)
Albumin: 3.7 g/dL (ref 3.5–5.0)
Alkaline Phosphatase: 50 U/L (ref 38–126)
Anion gap: 9 (ref 5–15)
BUN: 10 mg/dL (ref 6–20)
CO2: 28 mmol/L (ref 22–32)
Calcium: 9 mg/dL (ref 8.9–10.3)
Chloride: 100 mmol/L (ref 98–111)
Creatinine, Ser: 0.62 mg/dL (ref 0.44–1.00)
GFR, Estimated: >60 mL/min (ref >60)
Glucose, Bld: 93 mg/dL (ref 70–99)
Potassium: 3.4 mmol/L — ABNORMAL LOW (ref 3.5–5.1)
Sodium: 137 mmol/L (ref 135–145)
Total Bilirubin: 0.6 mg/dL (ref 0.0–1.2)
Total Protein: 6.8 g/dL (ref 6.5–8.1)

## 2023-07-24 LAB — CBC WITH DIFFERENTIAL/PLATELET
Abs Immature Granulocytes: 0.01 K/uL (ref 0.00–0.07)
Basophils Absolute: 0 K/uL (ref 0.0–0.1)
Basophils Relative: 0 %
Eosinophils Absolute: 0.3 K/uL (ref 0.0–0.5)
Eosinophils Relative: 6 %
HCT: 33 % — ABNORMAL LOW (ref 36.0–46.0)
Hemoglobin: 9.8 g/dL — ABNORMAL LOW (ref 12.0–15.0)
Immature Granulocytes: 0 %
Lymphocytes Relative: 25 %
Lymphs Abs: 1.3 K/uL (ref 0.7–4.0)
MCH: 22.4 pg — ABNORMAL LOW (ref 26.0–34.0)
MCHC: 29.7 g/dL — ABNORMAL LOW (ref 30.0–36.0)
MCV: 75.3 fL — ABNORMAL LOW (ref 80.0–100.0)
Monocytes Absolute: 0.5 K/uL (ref 0.1–1.0)
Monocytes Relative: 8 %
Neutro Abs: 3.2 K/uL (ref 1.7–7.7)
Neutrophils Relative %: 61 %
Platelets: 243 K/uL (ref 150–400)
RBC: 4.38 MIL/uL (ref 3.87–5.11)
RDW: 18.6 % — ABNORMAL HIGH (ref 11.5–15.5)
WBC: 5.3 K/uL (ref 4.0–10.5)
nRBC: 0 % (ref 0.0–0.2)

## 2023-07-24 LAB — SURGICAL PCR SCREEN
MRSA, PCR: NEGATIVE
Staphylococcus aureus: POSITIVE — AB

## 2023-07-24 NOTE — Patient Instructions (Addendum)
 Your procedure is scheduled on: THURSDAY 07/30/23 Report to the Registration Desk on the 1st floor of the [facility]. To find out your arrival time, please call [phone] between 1PM - 3PM on: [facility] 07/29/23 If your arrival time is 6:00 am, do not arrive before that time as the [facility] entrance doors do not open until 6:00 am.  REMEMBER: Instructions that are not followed completely may result in serious medical risk, up to and including death; or upon the discretion of your surgeon and anesthesiologist your surgery may need to be rescheduled.  Do not eat food after midnight the night before surgery.  No gum chewing or hard candies.  You may however, drink CLEAR liquids up to 2 hours before you are scheduled to arrive for your surgery. Do not drink anything within 2 hours of your scheduled arrival time.  Clear liquids include: - water  - apple juice without pulp - gatorade (not RED colors) - black coffee or tea (Do NOT add milk or creamers to the coffee or tea) Do NOT drink anything that is not on this list.  In addition, your doctor has ordered for you to drink the provided:  Ensure Pre-Surgery Clear Carbohydrate Drink  Drinking this carbohydrate drink up to two hours before surgery helps to reduce insulin resistance and improve patient outcomes. Please complete drinking 2 hours before scheduled arrival time.  One week prior to surgery: Stop Anti-inflammatories (NSAIDS) such as Advil , Aleve, Ibuprofen , Motrin , Naproxen, Naprosyn and Aspirin based products such as Excedrin, Goody's Powder, BC Powder. You may however, continue to take Tylenol  if needed for pain up until the day of surgery.  Stop ALL OVER THE COUNTER supplements and vitamins until after surgery.  Continue taking all of your other prescription medications up until the day of surgery.  ON THE DAY OF SURGERY ONLY TAKE THESE MEDICATIONS WITH SIPS OF WATER:  cetirizine (ZYRTEC) 10 MG tablet  DULoxetine   (CYMBALTA ) 60 MG capsule  gabapentin  (NEURONTIN ) 800 MG tablet  Oxycodone  HCl 10 MG TABS  pantoprazole  (PROTONIX ) 40 MG tablet   Use inhalers on the day of surgery and bring to the hospital.  No Alcohol for 24 hours before or after surgery.  No Smoking including e-cigarettes for 24 hours before surgery.  No chewable tobacco products for at least 6 hours before surgery.  No nicotine patches on the day of surgery.  Do not use any recreational drugs for at least a week (preferably 2 weeks) before your surgery.  Please be advised that the combination of cocaine and anesthesia may have negative outcomes, up to and including death. If you test positive for cocaine, your surgery will be cancelled.  On the morning of surgery brush your teeth with toothpaste and water, you may rinse your mouth with mouthwash if you wish. Do not swallow any toothpaste or mouthwash.  Use CHG Soap or wipes as directed on instruction sheet. Do not wear lotions, powders, or perfumes/cologne. Do not shave body hair from the neck down 48 hours before surgery. Wear comfortable clothing (specific to your surgery type) to the hospital.  Do not wear jewelry, make-up, hairpins, clips or nail polish.  For welded (permanent) jewelry: bracelets, anklets, waist bands, etc.  Please have this removed prior to surgery.  If it is not removed, there is a chance that hospital personnel will need to cut it off on the day of surgery.  Contact lenses, hearing aids and dentures may not be worn into surgery. Bring a case  for any glasses  Do not bring valuables to the hospital. [facility] is not responsible for any missing/lost belongings or valuables.   Notify your doctor if there is any change in your medical condition (cold, fever, infection).  After surgery, you can help prevent lung complications by doing breathing exercises.  Take deep breaths and cough every 1-2 hours. Your doctor may order a device called an Incentive  Spirometer to help you take deep breaths.  If you are being admitted to the hospital overnight, leave your suitcase in the car. After surgery it may be brought to your room.  In case of increased patient census, it may be necessary for you, the patient, to continue your postoperative care in the Same Day Surgery department.  If you are being discharged the day of surgery, you will not be allowed to drive home. You will need a responsible individual to drive you home and stay with you for 24 hours after surgery.   If you are taking public transportation, you will need to have a responsible individual with you.  Please call the Pre-admissions Testing Dept. at [phone] if you have any questions about these instructions.  Surgery Visitation Policy:  Patients having surgery or a procedure may have two visitors.  Children under the age of 35 must have an adult with them who is not the patient.  Inpatient Visitation:    Visiting hours are 7 a.m. to 8 p.m. Up to four visitors are allowed at one time in a patient room. The visitors may rotate out with other people during the day.  One visitor age 55 or older may stay with the patient overnight and must be in the room by 8 p.m.   Merchandiser, retail to address health-related social needs:  [email or web address]     Pre-operative 5 CHG Bath Instructions   You can play a key role in reducing the risk of infection after surgery. Your skin needs to be as free of germs as possible. You can reduce the number of germs on your skin by washing with CHG (chlorhexidine  gluconate) soap before surgery. CHG is an antiseptic soap that kills germs and continues to kill germs even after washing.   DO NOT use if you have an allergy to chlorhexidine /CHG or antibacterial soaps. If your skin becomes reddened or irritated, stop using the CHG and notify one of our RNs at [phone].   Please shower with the CHG soap starting 4 days  before surgery using the following schedule:   Sunday 07/26/23 - Thursday 07/30/23    Please keep in mind the following:  DO NOT shave, including legs and underarms, starting the day of your first shower.   You may shave your face at any point before/day of surgery.  Place clean sheets on your bed the day you start using CHG soap. Use a clean washcloth (not used since being washed) for each shower. DO NOT sleep with pets once you start using the CHG.   CHG Shower Instructions:  If you choose to wash your hair and private area, wash first with your normal shampoo/soap.  After you use shampoo/soap, rinse your hair and body thoroughly to remove shampoo/soap residue.  Turn the water OFF and apply about 3 tablespoons (45 ml) of CHG soap to a CLEAN washcloth.  Apply CHG soap ONLY FROM YOUR NECK DOWN TO YOUR TOES (washing for 3-5 minutes)  DO NOT use CHG soap on face, private areas, open wounds, or sores.  Pay special  attention to the area where your surgery is being performed.  If you are having back surgery, having someone wash your back for you may be helpful. Wait 2 minutes after CHG soap is applied, then you may rinse off the CHG soap.  Pat dry with a clean towel  Put on clean clothes/pajamas   If you choose to wear lotion, please use ONLY the CHG-compatible lotions on the back of this paper.     Additional instructions for the day of surgery: DO NOT APPLY any lotions, deodorants, cologne, or perfumes.   Put on clean/comfortable clothes.  Brush your teeth.  Ask your nurse before applying any prescription medications to the skin.      CHG Compatible Lotions   Aveeno Moisturizing lotion  Cetaphil Moisturizing Cream  Cetaphil Moisturizing Lotion  Clairol Herbal Essence Moisturizing Lotion, Dry Skin  Clairol Herbal Essence Moisturizing Lotion, Extra Dry Skin  Clairol Herbal Essence Moisturizing Lotion, Normal Skin  Curel Age Defying Therapeutic Moisturizing Lotion with Alpha  Hydroxy  Curel Extreme Care Body Lotion  Curel Soothing Hands Moisturizing Hand Lotion  Curel Therapeutic Moisturizing Cream, Fragrance-Free  Curel Therapeutic Moisturizing Lotion, Fragrance-Free  Curel Therapeutic Moisturizing Lotion, Original Formula  Eucerin Daily Replenishing Lotion  Eucerin Dry Skin Therapy Plus Alpha Hydroxy Crme  Eucerin Dry Skin Therapy Plus Alpha Hydroxy Lotion  Eucerin Original Crme  Eucerin Original Lotion  Eucerin Plus Crme Eucerin Plus Lotion  Eucerin TriLipid Replenishing Lotion  Keri Anti-Bacterial Hand Lotion  Keri Deep Conditioning Original Lotion Dry Skin Formula Softly Scented  Keri Deep Conditioning Original Lotion, Fragrance Free Sensitive Skin Formula  Keri Lotion Fast Absorbing Fragrance Free Sensitive Skin Formula  Keri Lotion Fast Absorbing Softly Scented Dry Skin Formula  Keri Original Lotion  Keri Skin Renewal Lotion Keri Silky Smooth Lotion  Keri Silky Smooth Sensitive Skin Lotion  Nivea Body Creamy Conditioning Oil  Nivea Body Extra Enriched Lotion  Nivea Body Original Lotion  Nivea Body Sheer Moisturizing Lotion Nivea Crme  Nivea Skin Firming Lotion  NutraDerm 30 Skin Lotion  NutraDerm Skin Lotion  NutraDerm Therapeutic Skin Cream  NutraDerm Therapeutic Skin Lotion  ProShield Protective Hand Cream  Provon moisturizing lotion  How to Use an Incentive Spirometer  An incentive spirometer is a tool that measures how well you are filling your lungs with each breath. Learning to take long, deep breaths using this tool can help you keep your lungs clear and active. This may help to reverse or lessen your chance of developing breathing (pulmonary) problems, especially infection. You may be asked to use a spirometer: After a surgery. If you have a lung problem or a history of smoking. After a long period of time when you have been unable to move or be active. If the spirometer includes an indicator to show the highest number that you  have reached, your health care provider or respiratory therapist will help you set a goal. Keep a log of your progress as told by your health care provider. What are the risks? Breathing too quickly may cause dizziness or cause you to pass out. Take your time so you do not get dizzy or light-headed. If you are in pain, you may need to take pain medicine before doing incentive spirometry. It is harder to take a deep breath if you are having pain. How to use your incentive spirometer  Sit up on the edge of your bed or on a chair. Hold the incentive spirometer so that it is in an upright  position. Before you use the spirometer, breathe out normally. Place the mouthpiece in your mouth. Make sure your lips are closed tightly around it. Breathe in slowly and as deeply as you can through your mouth, causing the piston or the ball to rise toward the top of the chamber. Hold your breath for 3-5 seconds, or for as long as possible. If the spirometer includes a coach indicator, use this to guide you in breathing. Slow down your breathing if the indicator goes above the marked areas. Remove the mouthpiece from your mouth and breathe out normally. The piston or ball will return to the bottom of the chamber. Rest for a few seconds, then repeat the steps 10 or more times. Take your time and take a few normal breaths between deep breaths so that you do not get dizzy or light-headed. Do this every 1-2 hours when you are awake. If the spirometer includes a goal marker to show the highest number you have reached (best effort), use this as a goal to work toward during each repetition. After each set of 10 deep breaths, cough a few times. This will help to make sure that your lungs are clear. If you have an incision on your chest or abdomen from surgery, place a pillow or a rolled-up towel firmly against the incision when you cough. This can help to reduce pain while taking deep breaths and coughing. General  tips When you are able to get out of bed: Walk around often. Continue to take deep breaths and cough in order to clear your lungs. Keep using the incentive spirometer until your health care provider says it is okay to stop using it. If you have been in the hospital, you may be told to keep using the spirometer at home. Contact a health care provider if: You are having difficulty using the spirometer. You have trouble using the spirometer as often as instructed. Your pain medicine is not giving enough relief for you to use the spirometer as told. You have a fever. Get help right away if: You develop shortness of breath. You develop a cough with bloody mucus from the lungs. You have fluid or blood coming from an incision site after you cough. Summary An incentive spirometer is a tool that can help you learn to take long, deep breaths to keep your lungs clear and active. You may be asked to use a spirometer after a surgery, if you have a lung problem or a history of smoking, or if you have been inactive for a long period of time. Use your incentive spirometer as instructed every 1-2 hours while you are awake. If you have an incision on your chest or abdomen, place a pillow or a rolled-up towel firmly against your incision when you cough. This will help to reduce pain. Get help right away if you have shortness of breath, you cough up bloody mucus, or blood comes from your incision when you cough. This information is not intended to replace advice given to you by your health care provider. Make sure you discuss any questions you have with your health care provider. Document Revised: 03/14/2019 Document Reviewed: 03/14/2019 Elsevier Patient Education  2023 Elsevier Inc.     Preoperative Educational Videos for Total Hip, Knee and Shoulder Replacements  To better prepare for surgery, please view our videos that explain the physical activity and discharge planning required to have the best  surgical recovery at [facility].  [email or web address]  Questions? Call [phone] or  email [email or web address]

## 2023-07-27 ENCOUNTER — Encounter
Admission: RE | Disposition: A | Discharge status: Home / Self Care | Payer: Self-pay | Source: Ambulatory Visit | Attending: Vascular Surgery

## 2023-07-27 ENCOUNTER — Encounter: Payer: Self-pay | Admitting: Urgent Care

## 2023-07-27 ENCOUNTER — Ambulatory Visit
Admission: RE | Admit: 2023-07-27 | Discharge: 2023-07-27 | Disposition: A | Discharge status: Home / Self Care | Source: Ambulatory Visit | Attending: Vascular Surgery | Admitting: Vascular Surgery

## 2023-07-27 ENCOUNTER — Encounter: Payer: Self-pay | Admitting: Vascular Surgery

## 2023-07-27 DIAGNOSIS — Z7901 Long term (current) use of anticoagulants: Secondary | ICD-10-CM | POA: Diagnosis not present

## 2023-07-27 DIAGNOSIS — Z87891 Personal history of nicotine dependence: Secondary | ICD-10-CM | POA: Diagnosis not present

## 2023-07-27 DIAGNOSIS — I82409 Acute embolism and thrombosis of unspecified deep veins of unspecified lower extremity: Secondary | ICD-10-CM | POA: Insufficient documentation

## 2023-07-27 DIAGNOSIS — Z86718 Personal history of other venous thrombosis and embolism: Secondary | ICD-10-CM

## 2023-07-27 DIAGNOSIS — Z408 Encounter for other prophylactic surgery: Secondary | ICD-10-CM | POA: Diagnosis not present

## 2023-07-27 HISTORY — PX: IVC FILTER INSERTION: CATH118245

## 2023-07-27 SURGERY — IVC FILTER INSERTION
Anesthesia: Moderate Sedation

## 2023-07-27 MED ORDER — MIDAZOLAM HCL 2 MG/ML PO SYRP: 8.0000 mg | ORAL_SOLUTION | Freq: Once | ORAL | Status: DC | PRN

## 2023-07-27 MED ORDER — HEPARIN (PORCINE) IN NACL 1000-0.9 UT/500ML-% IV SOLN
INTRAVENOUS | Status: DC | PRN
Start: 2023-07-27 — End: 2023-07-27
  Administered 2023-07-27: 500 mL

## 2023-07-27 MED ORDER — ONDANSETRON HCL 4 MG/2ML IJ SOLN: 4.0000 mg | Freq: Four times a day (QID) | INTRAMUSCULAR | Status: DC | PRN

## 2023-07-27 MED ORDER — MIDAZOLAM HCL 2 MG/2ML IJ SOLN
INTRAMUSCULAR | Status: DC | PRN
  Administered 2023-07-27: 1 mg via INTRAVENOUS
  Administered 2023-07-27: 2 mg via INTRAVENOUS

## 2023-07-27 MED ORDER — METHYLPREDNISOLONE SODIUM SUCC 125 MG IJ SOLR: 125.0000 mg | Freq: Once | INTRAMUSCULAR | Status: DC | PRN

## 2023-07-27 MED ORDER — FAMOTIDINE 20 MG PO TABS: 40.0000 mg | ORAL_TABLET | Freq: Once | ORAL | Status: DC | PRN

## 2023-07-27 MED ORDER — CEFAZOLIN SODIUM-DEXTROSE 2-4 GM/100ML-% IV SOLN
INTRAVENOUS | Status: AC
  Filled 2023-07-27: qty 100

## 2023-07-27 MED ORDER — IODIXANOL 320 MG/ML IV SOLN
INTRAVENOUS | Status: DC | PRN
Start: 2023-07-27 — End: 2023-07-27
  Administered 2023-07-27: 15 mL via INTRAVENOUS

## 2023-07-27 MED ORDER — FENTANYL CITRATE PF 50 MCG/ML IJ SOSY
PREFILLED_SYRINGE | INTRAMUSCULAR | Status: AC
  Filled 2023-07-27: qty 1

## 2023-07-27 MED ORDER — LIDOCAINE-EPINEPHRINE (PF) 1 %-1:200000 IJ SOLN
INTRAMUSCULAR | Status: DC | PRN
  Administered 2023-07-27: 10 mL via INTRADERMAL

## 2023-07-27 MED ORDER — MIDAZOLAM HCL 2 MG/2ML IJ SOLN
INTRAMUSCULAR | Status: AC
  Filled 2023-07-27: qty 4

## 2023-07-27 MED ORDER — SODIUM CHLORIDE 0.9 % IV SOLN: INTRAVENOUS | Status: DC

## 2023-07-27 MED ORDER — CEFAZOLIN SODIUM-DEXTROSE 2-4 GM/100ML-% IV SOLN
2.0000 g | INTRAVENOUS | Status: AC
  Administered 2023-07-27: 2 g via INTRAVENOUS

## 2023-07-27 MED ORDER — DIPHENHYDRAMINE HCL 50 MG/ML IJ SOLN: 50.0000 mg | Freq: Once | INTRAMUSCULAR | Status: DC | PRN

## 2023-07-27 MED ORDER — FENTANYL CITRATE (PF) 100 MCG/2ML IJ SOLN
INTRAMUSCULAR | Status: AC
  Filled 2023-07-27: qty 2

## 2023-07-27 MED ORDER — FENTANYL CITRATE (PF) 100 MCG/2ML IJ SOLN
INTRAMUSCULAR | Status: DC | PRN
  Administered 2023-07-27 (×2): 50 ug via INTRAVENOUS

## 2023-07-27 MED ORDER — HYDROMORPHONE HCL 1 MG/ML IJ SOLN: 1.0000 mg | Freq: Once | INTRAMUSCULAR | Status: DC | PRN

## 2023-07-27 SURGICAL SUPPLY — 4 items
COVER PROBE ULTRASOUND 5X96 (MISCELLANEOUS) IMPLANT
KIT FEM OPTION ELITE FILTER (Filter) IMPLANT
PACK ANGIOGRAPHY (CUSTOM PROCEDURE TRAY) ×1 IMPLANT
WIRE SUPRACORE 190CM (WIRE) IMPLANT

## 2023-07-27 NOTE — H&P (Signed)
 [facility] Admission History & Physical  MRN : [id number]  Samantha Doyle is a 48 y.o. ([birth year]) female who presents with chief complaint of No chief complaint on file. SABRA  History of Present Illness: Patient presents for her IVC filter placement.  Scheduled for her right knee replacement later this week.  Had history of DVT and was on anticoagulation.  Is at high risk for thromboembolic complications in the perioperative period so IVC filter is being placed.  No fevers or chills or new symptoms today.  No current facility-administered medications for this encounter.    Past Medical History:  Diagnosis Date   Allergic rhinitis    Bilateral primary osteoarthritis of knee    severe   Carpal tunnel syndrome    right wrist, numb all the time   Cervical dysplasia    Chronic back pain    Degenerative lumbar spondylosis with grade 2 spondylolisthesis L4 on L5 with bilater pars defects L4.    COVID-19 virus infection 02/06/2020   DDD (degenerative disc disease), lumbar    MRI 10/2015: grade I (7mm) anterolisthesis L4 on L5, w/ disc herniation with encroachment on spinal nerves at L4-S1 levels.   Depression    DVT, lower extremity, recurrent (HCC)    09/2022, bilat->eliquis  indef   Edema of both lower extremities due to peripheral venous insufficiency    +varicose veins (hx of vein stripping 2009)   GERD (gastroesophageal reflux disease)    worsened 07/2018 likely associated with lap band being too constricting->increased pantoprazole  to 40mg  bid at that time.   History of chronic bronchitis    hx of when she was a smoker: ? mild intermittent asthma?--much better since stopped smoking 2018.   History of fracture of phalanx of finger    left, 4th finger distal phalanx   History of non anemic vitamin B12 deficiency    s/p lap band surgery per pt report.   Hyperlipidemia    Hypertension    Iron  deficiency anemia 01/2019   hemoccults ordered but never turned  in (01/2019-01/2020). Iron  supp started 02/2020. Hemoccults neg 07/2020, plan for iron  infusion. 08/2020 EGD with SB bx normal, no source of IDA seen on colonoscopy. Iron  infusion 09/06/2020   LAP-BAND surgery status    Pt getting lap band removed and is getting gastric sleave after.   Migraine syndrome    Ovarian cyst 10/2015   LEFT, 4 cm--noted on L spine MRI w/out contrast.  F/u u/s imaging showed simple cyst.   Right tibial fracture    Fragility fracture 2024/2025-->calcitonin + bisphosphonate per ortho   Spondylarthrosis    Tobacco dependence in remission    quit 2018    Past Surgical History:  Procedure Laterality Date   BACK SURGERY  2019   CARPAL TUNNEL RELEASE Left    CERVICAL CONE BIOPSY     COLONOSCOPY     2015 normal.  08/2020 (for IDA)->incomplete prep, no adenomas->rpt 5 yrs d/t incomplete prep.   ESOPHAGOGASTRODUODENOSCOPY N/A 04/27/2014   Procedure: ESOPHAGOGASTRODUODENOSCOPY (EGD);  Surgeon: Alm Angle, MD;  Location: [facility];  Service: General;  Laterality: N/A;   ESOPHAGOGASTRODUODENOSCOPY  08/29/2020   6 cm hiatal hernia noted, o/w normal, SB bx neg.  ?hernia contributing to pt's IDA?   GASTRIC BANDING PORT REVISION N/A 10/02/2014   Procedure: LAPROSCOPIC PLACEMENT OF LAP BAND PORT;  Surgeon: Morene Olives, MD;  Location: [facility];  Service: General;  Laterality: N/A;   LAPAROSCOPIC GASTRIC BANDING  03/2007   [facility] -  [facility]; Dr Velinda Hipp   LAPAROSCOPIC REVISION OF GASTRIC BAND N/A 05/17/2012   Procedure: LAPAROSCOPIC REVISION OF SLIPPED GASTRIC BAND;  Surgeon: Morene ONEIDA Olives, MD;  Location: [facility];  Service: General;  Laterality: N/A;   LAPAROSCOPY N/A 04/30/2014   Procedure: DIAGNOSTIC LAPAROSCOPY WITH REMOVAL OF INFECTED LAP BAND PORT WITH DEBRIDEMENT SUBCUTANEOUS ABSCESS;  Surgeon: Camellia Blush, MD;  Location: [facility];  Service: General;  Laterality: N/A;   VARICOSE VEIN SURGERY Bilateral 2009     Social History   Tobacco Use    Smoking status: Former    Current packs/day: 0.25    Average packs/day: 0.3 packs/day for 5.0 years (1.3 ttl pk-yrs)    Types: Cigarettes   Smokeless tobacco: Never  Vaping Use   Vaping status: Never Used  Substance Use Topics   Alcohol use: No   Drug use: No     Family History  Problem Relation Age of Onset   Alcohol abuse Mother    Drug abuse Mother    Arthritis Mother    Hyperlipidemia Mother    Heart disease Mother    Hypertension Mother    Non-Hodgkin's lymphoma Mother    Lung cancer Mother    Alcohol abuse Father    Arthritis Father    Hyperlipidemia Father    Heart disease Father    Stroke Father    Hypertension Father    Mental illness Father    Diabetes Father    Hyperlipidemia Sister    Hypertension Sister    Arthritis Maternal Grandmother    Cancer Maternal Grandmother    Hypertension Maternal Grandmother    Arthritis Maternal Grandfather    Arthritis Paternal Grandmother    Cancer Paternal Grandmother    Hyperlipidemia Paternal Grandmother    Heart disease Paternal Grandmother    Hypertension Paternal Grandmother    Diabetes Paternal Grandmother    Arthritis Paternal Grandfather    Hyperlipidemia Paternal Grandfather    Heart disease Paternal Grandfather    Stroke Paternal Grandfather    Hypertension Paternal Grandfather     Allergies  Allergen Reactions   Meperidine Hives and Nausea And Vomiting   Vancomycin  Anaphylaxis and Itching    Face/back warm and red.  Pt lips and tongue swelled, hives   Demerol Hives and Nausea And Vomiting   Microplegia Msa-Msg  [Cardioplegia [place] Formula] Other (See Comments)    Other reaction(s): headache   Monosodium Glutamate Nausea And Vomiting and Other (See Comments)    migraines    Tape Rash    Plastic tape     REVIEW OF SYSTEMS (Negative unless checked)  Constitutional: [] Weight loss  [] Fever  [] Chills Cardiac: [] Chest pain   [] Chest pressure   [] Palpitations   [] Shortness of breath when laying  flat   [] Shortness of breath at rest   [] Shortness of breath with exertion. Vascular:  [] Pain in legs with walking   [] Pain in legs at rest   [] Pain in legs when laying flat   [] Claudication   [] Pain in feet when walking  [] Pain in feet at rest  [] Pain in feet when laying flat   [x] History of DVT   [] Phlebitis   [] Swelling in legs   [] Varicose veins   [] Non-healing ulcers Pulmonary:   [] Uses home oxygen   [] Productive cough   [] Hemoptysis   [] Wheeze  [] COPD   [] Asthma Neurologic:  [] Dizziness  [] Blackouts   [] Seizures   [] History of stroke   [] History of TIA  [] Aphasia   []   Temporary blindness   [] Dysphagia   [] Weakness or numbness in arms   [] Weakness or numbness in legs Musculoskeletal:  [x] Arthritis   [] Joint swelling   [x] Joint pain   [] Low back pain Hematologic:  [] Easy bruising  [] Easy bleeding   [] Hypercoagulable state   [] Anemic  [] Hepatitis Gastrointestinal:  [] Blood in stool   [] Vomiting blood  [] Gastroesophageal reflux/heartburn   [] Difficulty swallowing. Genitourinary:  [] Chronic kidney disease   [] Difficult urination  [] Frequent urination  [] Burning with urination   [] Blood in urine Skin:  [] Rashes   [] Ulcers   [] Wounds Psychological:  [] History of anxiety   []  History of major depression.  Physical Examination  There were no vitals filed for this visit. There is no height or weight on file to calculate BMI. Gen: WD/WN, NAD. Obese  Head: [place]/AT, No temporalis wasting.  Ear/Nose/Throat: Hearing grossly intact, nares w/o erythema or drainage, oropharynx w/o Erythema/Exudate,  Eyes: Conjunctiva clear, sclera non-icteric Neck: Trachea midline.  No JVD.  Pulmonary:  Good air movement, respirations not labored, no use of accessory muscles.  Cardiac: RRR, normal S1, S2. Vascular:  Vessel Right Left  Radial Palpable Palpable                          PT Palpable Palpable  DP Palpable Palpable   Gastrointestinal: soft, non-tender/non-distended. No guarding/reflex.   Musculoskeletal: M/S 5/5 throughout.  Extremities without ischemic changes.  No deformity or atrophy.  Neurologic: Sensation grossly intact in extremities.  Symmetrical.  Speech is fluent. Motor exam as listed above. Psychiatric: Judgment intact, Mood & affect appropriate for pt's clinical situation. Dermatologic: No rashes or ulcers noted.  No cellulitis or open wounds.      CBC Lab Results  Component Value Date   WBC 5.3 07/24/2023   HGB 9.8 (L) 07/24/2023   HCT 33.0 (L) 07/24/2023   MCV 75.3 (L) 07/24/2023   PLT 243 07/24/2023    BMET    Component Value Date/Time   NA 137 07/24/2023 1040   NA 140 08/19/2020 0000   K 3.4 (L) 07/24/2023 1040   CL 100 07/24/2023 1040   CO2 28 07/24/2023 1040   GLUCOSE 93 07/24/2023 1040   BUN 10 07/24/2023 1040   BUN 13 08/19/2020 0000   CREATININE 0.62 07/24/2023 1040   CREATININE 0.71 10/22/2022 1403   CREATININE 0.61 08/07/2017 1508   CALCIUM 9.0 07/24/2023 1040   GFRNONAA >60 07/24/2023 1040   GFRNONAA >60 10/22/2022 1403   GFRAA >60 09/28/2014 1345   Estimated Creatinine Clearance: 101.7 mL/min (by C-G formula based on SCr of 0.62 mg/dL).  COAG Lab Results  Component Value Date   INR 1.1 (H) 04/30/2023   INR 2.8 (H) 03/12/2023    Radiology No results found.   Assessment/Plan History of deep venous thrombosis.  She had appropriate 33-month treatment with anticoagulation.  She is at very high risk of thromboembolic complications around her knee replacement.  For this reason, an IVC filter will be placed today.  She can return in 6 to 8 weeks with duplex and consider filter removal at that time. Hypertension.  Stable on outpatient medications and blood pressure control important in reducing the progression of atherosclerotic disease. On appropriate oral medications. Right knee arthritis.  For knee replacement later this week.   Selinda Gu, MD  07/27/2023 11:36 AM

## 2023-07-27 NOTE — Op Note (Signed)
 [facility]   OPERATIVE NOTE    PRE-OPERATIVE DIAGNOSIS: previous DVT, upcoming total knee replacement  POST-OPERATIVE DIAGNOSIS: same as above  PROCEDURE: 1.   Ultrasound guidance for vascular access to the right femoral vein 2.   Catheter placement into the inferior vena cava 3.   Inferior venacavogram 4.   Placement of a option Elite IVC filter  SURGEON: Selinda Gu, MD  ASSISTANT(S): None  ANESTHESIA: local with Moderate Conscious Sedation for approximately 19 minutes using 3 mg of Versed  and 100 mcg of Fentanyl   ESTIMATED BLOOD LOSS: minimal  CONTRAST: 3 cc  FLUORO TIME: less than one minute  FINDING(S): 1.  Patent IVC  SPECIMEN(S):  none  INDICATIONS:   Samantha Doyle is a 48 y.o. female who presents with an upcoming total knee replacement with a previous history of extensive DVT.  Inferior vena cava filter is indicated for this reason.  Risks and benefits including filter thrombosis, migration, fracture, bleeding, and infection were all discussed.  We discussed that all IVC filters that we place can be removed if desired from the patient once the need for the filter has passed.    DESCRIPTION: After obtaining full informed written consent, the patient was brought back to the vascular suite. The skin was sterilely prepped and draped in a sterile surgical field was created. Moderate conscious sedation was administered during a face to face encounter with the patient throughout the procedure with my supervision of the RN administering medicines and monitoring the patient's vital signs, pulse oximetry, telemetry and mental status throughout from the start of the procedure until the patient was taken to the recovery room. The right femoral vein was accessed under direct ultrasound guidance without difficulty with a Seldinger needle and a J-wire was then placed. After skin nick and dilatation, the delivery sheath was placed into the inferior vena cava  and an inferior venacavogram was performed. This demonstrated a patent IVC with the level of the renal veins at L1.  The filter was then deployed into the inferior vena cava at the level of L2 just below the renal veins. The delivery sheath was then removed. Pressure was held. Sterile dressings were placed. The patient tolerated the procedure well and was taken to the recovery room in stable condition.  COMPLICATIONS: None  CONDITION: Stable  Selinda Gu  07/27/2023, 12:57 PM   This note was created with Dragon Medical transcription system. Any errors in dictation are purely unintentional.

## 2023-07-27 NOTE — Discharge Instructions (Signed)
 Inferior Vena Cava Filter Insertion, Care After The following information offers guidance on how to care for yourself after your procedure. Your health care provider may also give you more specific instructions. If you have problems or questions, contact your health care provider. What can I expect after the procedure? After the procedure, it is common to have: Mild pain and bruising around the area where the long, thin tube (catheter) was inserted in your neck or groin to deliver the filter. Tiredness (fatigue). Follow these instructions at home: Insertion site care Follow instructions from your health care provider about how to take care of your catheter insertion site. Make sure you: Wash your hands with soap and water for at least 20 seconds before and after you change your bandage (dressing). If soap and water are not available, use hand sanitizer. Remove your dressing in 48 hours You may shower tomorrow Keep the dressing and the insertion site clean and dry. Check your insertion site every day for signs of infection. Check for: More redness, swelling, or pain. Fluid or blood. Warmth. Pus or a bad smell. Do not take baths, swim, or use a hot tub for 7 days after your procedure.  Activity: Avoid strenuous exercise or activities that take a lot of effort for 1 week. Do not go back to school or work for 3 days. Avoid any heavy lifting for 1 week.  General instructions If you were given a sedative during the procedure, it can affect you for several hours. Do not drive or operate machinery for 24 hours Take over-the-counter and prescription medicines only as told by your health care provider. Wear compression stockings as told by your health care provider. These stockings help to prevent blood clots and reduce swelling in your legs. Keep all follow-up visits. This is important. Contact a health care provider if: You have any of these signs of infection: More redness, swelling, or pain  around your catheter insertion site. Fluid or blood coming from your insertion site. Warmth coming from your insertion site. Pus or a bad smell coming from your insertion site. A fever. You are dizzy. You have nausea and vomiting. You develop a rash. Get help right away if: You have blood coming from your catheter insertion site (active bleeding). If you have bleeding from the insertion site, lie down, apply pressure to the area with a clean cloth or gauze, and get help right away. You have chest pain, a cough, or difficulty breathing. You have shortness of breath, feel faint, or pass out. You cough up blood. You have severe pain in your abdomen. You develop swelling and discoloration or pain in your legs. Your legs become pale and cold or blue. You have weakness, difficulty moving your arms or legs, or balance problems. You develop problems with speech or vision. These symptoms may be an emergency. Get help right away. Call 911. Do not wait to see if the symptoms will go away. Do not drive yourself to the hospital. Summary Follow instructions from your health care provider about how to take care of your catheter insertion site. After the procedure, it is common to have mild pain and bruising where a catheter was inserted at your neck or groin. Every day, check for signs of infection at the catheter insertion site. Get help right away if you have active bleeding, chest pain, or trouble breathing. This information is not intended to replace advice given to you by your health care provider. Make sure you discuss any questions you  have with your health care provider. Document Revised: 01/08/2021 Document Reviewed: 12/22/2018 Elsevier Patient Education  2023 [employer].

## 2023-07-30 ENCOUNTER — Ambulatory Visit
Admission: RE | Admit: 2023-07-30 | Discharge: 2023-07-31 | Disposition: A | Discharge status: Home Health | Source: Ambulatory Visit | Attending: Orthopedic Surgery | Admitting: Orthopedic Surgery

## 2023-07-30 ENCOUNTER — Ambulatory Visit: Admitting: General Practice

## 2023-07-30 ENCOUNTER — Ambulatory Visit

## 2023-07-30 ENCOUNTER — Encounter
Admission: RE | Disposition: A | Discharge status: Home Health | Payer: Self-pay | Source: Ambulatory Visit | Attending: Orthopedic Surgery

## 2023-07-30 ENCOUNTER — Other Ambulatory Visit: Payer: Self-pay

## 2023-07-30 ENCOUNTER — Encounter: Payer: Self-pay | Admitting: Orthopedic Surgery

## 2023-07-30 DIAGNOSIS — G4730 Sleep apnea, unspecified: Secondary | ICD-10-CM | POA: Insufficient documentation

## 2023-07-30 DIAGNOSIS — Z6841 Body Mass Index (BMI) 40.0 and over, adult: Secondary | ICD-10-CM | POA: Diagnosis not present

## 2023-07-30 DIAGNOSIS — Z86718 Personal history of other venous thrombosis and embolism: Secondary | ICD-10-CM | POA: Insufficient documentation

## 2023-07-30 DIAGNOSIS — M1711 Unilateral primary osteoarthritis, right knee: Secondary | ICD-10-CM | POA: Insufficient documentation

## 2023-07-30 DIAGNOSIS — I10 Essential (primary) hypertension: Secondary | ICD-10-CM | POA: Insufficient documentation

## 2023-07-30 DIAGNOSIS — Z87891 Personal history of nicotine dependence: Secondary | ICD-10-CM | POA: Insufficient documentation

## 2023-07-30 DIAGNOSIS — Z96651 Presence of right artificial knee joint: Secondary | ICD-10-CM

## 2023-07-30 DIAGNOSIS — G894 Chronic pain syndrome: Secondary | ICD-10-CM | POA: Diagnosis not present

## 2023-07-30 DIAGNOSIS — Z7901 Long term (current) use of anticoagulants: Secondary | ICD-10-CM | POA: Diagnosis not present

## 2023-07-30 DIAGNOSIS — K219 Gastro-esophageal reflux disease without esophagitis: Secondary | ICD-10-CM | POA: Insufficient documentation

## 2023-07-30 DIAGNOSIS — E66813 Obesity, class 3: Secondary | ICD-10-CM | POA: Insufficient documentation

## 2023-07-30 HISTORY — PX: TOTAL KNEE ARTHROPLASTY: SHX125

## 2023-07-30 LAB — POCT PREGNANCY, URINE
Preg Test, Ur: NEGATIVE
Preg Test, Ur: NEGATIVE

## 2023-07-30 LAB — GLUCOSE, CAPILLARY: Glucose-Capillary: 99 mg/dL (ref 70–99)

## 2023-07-30 SURGERY — ARTHROPLASTY, KNEE, TOTAL
Anesthesia: Spinal | Site: Knee | Laterality: Right

## 2023-07-30 MED ORDER — BUPIVACAINE LIPOSOME 1.3 % IJ SUSP
INTRAMUSCULAR | Status: AC
  Filled 2023-07-30: qty 20

## 2023-07-30 MED ORDER — PROPOFOL 1000 MG/100ML IV EMUL
INTRAVENOUS | Status: AC
  Filled 2023-07-30: qty 100

## 2023-07-30 MED ORDER — MIDAZOLAM HCL 2 MG/2ML IJ SOLN
INTRAMUSCULAR | Status: AC
  Filled 2023-07-30: qty 2

## 2023-07-30 MED ORDER — ENOXAPARIN SODIUM 30 MG/0.3ML IJ SOSY: 30.0000 mg | PREFILLED_SYRINGE | Freq: Two times a day (BID) | INTRAMUSCULAR | Status: DC

## 2023-07-30 MED ORDER — PHENYLEPHRINE HCL-NACL 20-0.9 MG/250ML-% IV SOLN
INTRAVENOUS | Status: AC
  Filled 2023-07-30: qty 250

## 2023-07-30 MED ORDER — OXYCODONE HCL 5 MG/5ML PO SOLN: 5.0000 mg | Freq: Once | ORAL | Status: AC | PRN

## 2023-07-30 MED ORDER — METOCLOPRAMIDE HCL 5 MG/ML IJ SOLN: 5.0000 mg | Freq: Three times a day (TID) | INTRAMUSCULAR | Status: DC | PRN

## 2023-07-30 MED ORDER — CHLORHEXIDINE GLUCONATE 0.12 % MT SOLN
OROMUCOSAL | Status: AC
  Filled 2023-07-30: qty 15

## 2023-07-30 MED ORDER — FENTANYL CITRATE (PF) 100 MCG/2ML IJ SOLN
INTRAMUSCULAR | Status: AC
Start: 2023-07-30 — End: 2023-07-30
  Filled 2023-07-30: qty 2

## 2023-07-30 MED ORDER — LORATADINE 10 MG PO TABS
10.0000 mg | ORAL_TABLET | Freq: Every day | ORAL | Status: DC
  Administered 2023-07-30: 10 mg via ORAL
  Filled 2023-07-30 (×2): qty 1

## 2023-07-30 MED ORDER — OXYCODONE HCL 5 MG PO TABS
5.0000 mg | ORAL_TABLET | Freq: Once | ORAL | Status: AC | PRN
  Administered 2023-07-30: 5 mg via ORAL

## 2023-07-30 MED ORDER — OXYCODONE HCL 5 MG PO TABS: 5.0000 mg | ORAL_TABLET | ORAL | 0 refills | Status: DC | PRN

## 2023-07-30 MED ORDER — ACETAMINOPHEN 500 MG PO TABS
1000.0000 mg | ORAL_TABLET | Freq: Three times a day (TID) | ORAL | Status: DC
  Administered 2023-07-30 – 2023-07-31 (×2): 1000 mg via ORAL
  Filled 2023-07-30 (×2): qty 2

## 2023-07-30 MED ORDER — ONDANSETRON HCL 4 MG PO TABS: 4.0000 mg | ORAL_TABLET | Freq: Four times a day (QID) | ORAL | Status: DC | PRN

## 2023-07-30 MED ORDER — ORAL CARE MOUTH RINSE: 15.0000 mL | Freq: Once | OROMUCOSAL | Status: AC

## 2023-07-30 MED ORDER — DULOXETINE HCL 30 MG PO CPEP
60.0000 mg | ORAL_CAPSULE | Freq: Every day | ORAL | Status: DC
Start: 2023-07-30 — End: 2023-07-31
  Administered 2023-07-30: 60 mg via ORAL
  Filled 2023-07-30 (×2): qty 2

## 2023-07-30 MED ORDER — BUPIVACAINE-EPINEPHRINE (PF) 0.25% -1:200000 IJ SOLN
INTRAMUSCULAR | Status: AC
Start: 2023-07-30 — End: 2023-07-30
  Filled 2023-07-30: qty 30

## 2023-07-30 MED ORDER — BUPIVACAINE HCL (PF) 0.5 % IJ SOLN
INTRAMUSCULAR | Status: AC
  Filled 2023-07-30: qty 10

## 2023-07-30 MED ORDER — MENTHOL 3 MG MT LOZG: 1.0000 | LOZENGE | OROMUCOSAL | Status: DC | PRN

## 2023-07-30 MED ORDER — DOCUSATE SODIUM 100 MG PO CAPS: 100.0000 mg | ORAL_CAPSULE | Freq: Two times a day (BID) | ORAL | 0 refills | Status: DC

## 2023-07-30 MED ORDER — CEFAZOLIN SODIUM-DEXTROSE 2-4 GM/100ML-% IV SOLN
INTRAVENOUS | Status: AC
  Filled 2023-07-30: qty 100

## 2023-07-30 MED ORDER — CEFAZOLIN SODIUM-DEXTROSE 2-4 GM/100ML-% IV SOLN
2.0000 g | INTRAVENOUS | Status: AC
  Administered 2023-07-30: 2 g via INTRAVENOUS

## 2023-07-30 MED ORDER — APIXABAN 2.5 MG PO TABS: 2.5000 mg | ORAL_TABLET | Freq: Two times a day (BID) | ORAL | 0 refills | Status: DC

## 2023-07-30 MED ORDER — STERILE WATER FOR IRRIGATION IR SOLN
Status: DC | PRN
  Administered 2023-07-30: 1000 mL

## 2023-07-30 MED ORDER — SODIUM CHLORIDE (PF) 0.9 % IJ SOLN
INTRAMUSCULAR | Status: AC
  Filled 2023-07-30: qty 20

## 2023-07-30 MED ORDER — KETOROLAC TROMETHAMINE 30 MG/ML IJ SOLN
INTRAMUSCULAR | Status: AC
  Filled 2023-07-30: qty 1

## 2023-07-30 MED ORDER — KETOROLAC TROMETHAMINE 15 MG/ML IJ SOLN
7.5000 mg | Freq: Four times a day (QID) | INTRAMUSCULAR | Status: AC
  Administered 2023-07-30 – 2023-07-31 (×4): 7.5 mg via INTRAVENOUS
  Filled 2023-07-30 (×3): qty 1

## 2023-07-30 MED ORDER — OXYCODONE HCL 5 MG PO TABS
ORAL_TABLET | ORAL | Status: AC
  Filled 2023-07-30: qty 1

## 2023-07-30 MED ORDER — ONDANSETRON HCL 4 MG/2ML IJ SOLN: 4.0000 mg | Freq: Four times a day (QID) | INTRAMUSCULAR | Status: DC | PRN

## 2023-07-30 MED ORDER — DEXAMETHASONE SODIUM PHOSPHATE 10 MG/ML IJ SOLN
8.0000 mg | Freq: Once | INTRAMUSCULAR | Status: AC
  Administered 2023-07-30: 10 mg via INTRAVENOUS

## 2023-07-30 MED ORDER — SODIUM CHLORIDE 0.9 % IV SOLN: INTRAVENOUS | Status: DC

## 2023-07-30 MED ORDER — TRANEXAMIC ACID-NACL 1000-0.7 MG/100ML-% IV SOLN
INTRAVENOUS | Status: AC
  Filled 2023-07-30: qty 100

## 2023-07-30 MED ORDER — DEXAMETHASONE SODIUM PHOSPHATE 10 MG/ML IJ SOLN
INTRAMUSCULAR | Status: AC
  Filled 2023-07-30: qty 1

## 2023-07-30 MED ORDER — CELECOXIB 200 MG PO CAPS: 200.0000 mg | ORAL_CAPSULE | Freq: Two times a day (BID) | ORAL | 0 refills | Status: AC

## 2023-07-30 MED ORDER — MIDAZOLAM HCL 5 MG/5ML IJ SOLN
INTRAMUSCULAR | Status: DC | PRN
  Administered 2023-07-30: 2 mg via INTRAVENOUS

## 2023-07-30 MED ORDER — CHLORHEXIDINE GLUCONATE 4 % EX SOLN: 1.0000 | CUTANEOUS | 1 refills | Status: DC

## 2023-07-30 MED ORDER — MUPIROCIN 2 % EX OINT: 1.0000 | TOPICAL_OINTMENT | Freq: Two times a day (BID) | CUTANEOUS | 0 refills | Status: AC

## 2023-07-30 MED ORDER — PHENOL 1.4 % MT LIQD: 1.0000 | OROMUCOSAL | Status: DC | PRN

## 2023-07-30 MED ORDER — PHENYLEPHRINE HCL-NACL 20-0.9 MG/250ML-% IV SOLN
INTRAVENOUS | Status: DC | PRN
  Administered 2023-07-30: 20 ug/min via INTRAVENOUS

## 2023-07-30 MED ORDER — CHLORHEXIDINE GLUCONATE 0.12 % MT SOLN
15.0000 mL | Freq: Once | OROMUCOSAL | Status: AC
  Administered 2023-07-30: 15 mL via OROMUCOSAL

## 2023-07-30 MED ORDER — MORPHINE SULFATE (PF) 2 MG/ML IV SOLN: 0.5000 mg | INTRAVENOUS | Status: DC | PRN

## 2023-07-30 MED ORDER — METOCLOPRAMIDE HCL 10 MG PO TABS: 5.0000 mg | ORAL_TABLET | Freq: Three times a day (TID) | ORAL | Status: DC | PRN

## 2023-07-30 MED ORDER — ONDANSETRON HCL 4 MG PO TABS: 4.0000 mg | ORAL_TABLET | Freq: Four times a day (QID) | ORAL | 0 refills | Status: DC | PRN

## 2023-07-30 MED ORDER — PANTOPRAZOLE SODIUM 40 MG PO TBEC
40.0000 mg | DELAYED_RELEASE_TABLET | Freq: Every day | ORAL | Status: DC
  Administered 2023-07-30: 40 mg via ORAL
  Filled 2023-07-30 (×2): qty 1

## 2023-07-30 MED ORDER — LISINOPRIL 20 MG PO TABS: 40.0000 mg | ORAL_TABLET | Freq: Every day | ORAL | Status: DC

## 2023-07-30 MED ORDER — SURGIPHOR WOUND IRRIGATION SYSTEM - OPTIME: TOPICAL | Status: DC | PRN

## 2023-07-30 MED ORDER — BUPIVACAINE HCL (PF) 0.5 % IJ SOLN
INTRAMUSCULAR | Status: DC | PRN
  Administered 2023-07-30: 2.6 mL

## 2023-07-30 MED ORDER — PROPOFOL 10 MG/ML IV BOLUS
INTRAVENOUS | Status: DC | PRN
  Administered 2023-07-30: 100 ug/kg/min via INTRAVENOUS
  Administered 2023-07-30: 40 mg via INTRAVENOUS

## 2023-07-30 MED ORDER — DOCUSATE SODIUM 100 MG PO CAPS
100.0000 mg | ORAL_CAPSULE | Freq: Two times a day (BID) | ORAL | Status: DC
  Administered 2023-07-30 – 2023-07-31 (×2): 100 mg via ORAL
  Filled 2023-07-30 (×3): qty 1

## 2023-07-30 MED ORDER — CEFAZOLIN SODIUM-DEXTROSE 2-4 GM/100ML-% IV SOLN
2.0000 g | Freq: Four times a day (QID) | INTRAVENOUS | Status: AC
  Administered 2023-07-30 (×2): 2 g via INTRAVENOUS
  Filled 2023-07-30 (×2): qty 100

## 2023-07-30 MED ORDER — ACETAMINOPHEN 325 MG PO TABS: 325.0000 mg | ORAL_TABLET | Freq: Four times a day (QID) | ORAL | Status: DC | PRN

## 2023-07-30 MED ORDER — FENTANYL CITRATE (PF) 100 MCG/2ML IJ SOLN
INTRAMUSCULAR | Status: DC | PRN
  Administered 2023-07-30: 50 ug via INTRAVENOUS

## 2023-07-30 MED ORDER — GABAPENTIN 400 MG PO CAPS
800.0000 mg | ORAL_CAPSULE | Freq: Three times a day (TID) | ORAL | Status: DC
  Administered 2023-07-30 (×2): 800 mg via ORAL
  Filled 2023-07-30 (×3): qty 2

## 2023-07-30 MED ORDER — OXYCODONE HCL 5 MG PO TABS
5.0000 mg | ORAL_TABLET | ORAL | Status: DC | PRN
  Administered 2023-07-30 – 2023-07-31 (×4): 5 mg via ORAL
  Filled 2023-07-30 (×4): qty 1

## 2023-07-30 MED ORDER — SODIUM CHLORIDE (PF) 0.9 % IJ SOLN
INTRAMUSCULAR | Status: DC | PRN
  Administered 2023-07-30: 71 mL

## 2023-07-30 MED ORDER — SODIUM CHLORIDE 0.9 % IR SOLN
Status: DC | PRN
  Administered 2023-07-30: 3000 mL

## 2023-07-30 MED ORDER — KETOROLAC TROMETHAMINE 15 MG/ML IJ SOLN
INTRAMUSCULAR | Status: AC
  Filled 2023-07-30: qty 1

## 2023-07-30 MED ORDER — FENTANYL CITRATE (PF) 100 MCG/2ML IJ SOLN: 25.0000 ug | INTRAMUSCULAR | Status: DC | PRN

## 2023-07-30 MED ORDER — METHYLPHENIDATE HCL ER (OSM) 36 MG PO TBCR
36.0000 mg | EXTENDED_RELEASE_TABLET | Freq: Every day | ORAL | Status: DC
Start: 2023-07-30 — End: 2023-07-31

## 2023-07-30 MED ORDER — APIXABAN 2.5 MG PO TABS
2.5000 mg | ORAL_TABLET | Freq: Two times a day (BID) | ORAL | Status: DC
  Administered 2023-07-31: 2.5 mg via ORAL
  Filled 2023-07-30: qty 1

## 2023-07-30 MED ORDER — ALBUTEROL SULFATE (2.5 MG/3ML) 0.083% IN NEBU: 2.5000 mg | INHALATION_SOLUTION | RESPIRATORY_TRACT | Status: DC | PRN

## 2023-07-30 MED ORDER — TRANEXAMIC ACID-NACL 1000-0.7 MG/100ML-% IV SOLN
1000.0000 mg | INTRAVENOUS | Status: AC
  Administered 2023-07-30 (×2): 1000 mg via INTRAVENOUS

## 2023-07-30 MED ORDER — ACETAMINOPHEN 500 MG PO TABS: 1000.0000 mg | ORAL_TABLET | Freq: Three times a day (TID) | ORAL | 0 refills | Status: DC

## 2023-07-30 MED ORDER — OXYCODONE HCL 5 MG PO TABS
10.0000 mg | ORAL_TABLET | Freq: Four times a day (QID) | ORAL | Status: DC
  Administered 2023-07-30 – 2023-07-31 (×3): 10 mg via ORAL
  Filled 2023-07-30 (×3): qty 2

## 2023-07-30 SURGICAL SUPPLY — 57 items
BLADE REAMER PATELLA SZ29 (BLADE) IMPLANT
BLADE SAW 90X13X1.19 OSCILLAT (BLADE) IMPLANT
BLADE SAW SAG 25X90X1.19 (BLADE) ×1 IMPLANT
BLADE SAW SAG 29X58X.64 (BLADE) ×1 IMPLANT
BNDG ELASTIC 6INX 5YD STR LF (GAUZE/BANDAGES/DRESSINGS) ×1 IMPLANT
BOWL CEMENT MIX W/ADAPTER (MISCELLANEOUS) IMPLANT
BRUSH SCRUB EZ PLAIN DRY (MISCELLANEOUS) IMPLANT
CHLORAPREP W/TINT 26 (MISCELLANEOUS) ×2 IMPLANT
COMPONENT FEM KNEE STD PS 5 RT (Joint) IMPLANT
COMPONENT PATELLA 3 PEG 29X9 (Joint) IMPLANT
COOLER ICEMAN CLASSIC (MISCELLANEOUS) ×1 IMPLANT
CUFF TRNQT CYL 24X4X16.5-23 (TOURNIQUET CUFF) IMPLANT
CUFF TRNQT CYL 30X4X21-28X (TOURNIQUET CUFF) IMPLANT
DERMABOND ADVANCED .7 DNX12 (GAUZE/BANDAGES/DRESSINGS) ×1 IMPLANT
DRAPE SHEET LG 3/4 BI-LAMINATE (DRAPES) ×2 IMPLANT
DRSG MEPILEX SACRM 8.7X9.8 (GAUZE/BANDAGES/DRESSINGS) ×1 IMPLANT
DRSG OPSITE POSTOP 4X10 (GAUZE/BANDAGES/DRESSINGS) IMPLANT
DRSG OPSITE POSTOP 4X8 (GAUZE/BANDAGES/DRESSINGS) IMPLANT
ELECTRODE REM PT RTRN 9FT ADLT (ELECTROSURGICAL) ×1 IMPLANT
GLOVE BIO SURGEON STRL SZ8 (GLOVE) ×1 IMPLANT
GLOVE BIOGEL PI IND STRL 8 (GLOVE) ×1 IMPLANT
GLOVE PI ORTHO PRO STRL 7.5 (GLOVE) ×2 IMPLANT
GLOVE PI ORTHO PRO STRL SZ8 (GLOVE) ×2 IMPLANT
GLOVE SURG SYN 7.5 PF PI (GLOVE) ×1 IMPLANT
GOWN SRG XL LVL 3 NONREINFORCE (GOWNS) ×1 IMPLANT
GOWN STRL REUS W/ TWL LRG LVL3 (GOWN DISPOSABLE) ×1 IMPLANT
GOWN STRL REUS W/ TWL XL LVL3 (GOWN DISPOSABLE) ×1 IMPLANT
HOOD PEEL AWAY T7 (MISCELLANEOUS) ×2 IMPLANT
INSERT TIB ASF 11 4-5/CD RT (Insert) IMPLANT
KIT TURNOVER KIT A (KITS) ×1 IMPLANT
MANIFOLD NEPTUNE II (INSTRUMENTS) ×1 IMPLANT
MARKER SKIN DUAL TIP RULER LAB (MISCELLANEOUS) ×1 IMPLANT
MAT ABSORB FLUID 56X50 GRAY (MISCELLANEOUS) ×1 IMPLANT
NDL HYPO 21X1.5 SAFETY (NEEDLE) ×1 IMPLANT
NEEDLE HYPO 21X1.5 SAFETY (NEEDLE) ×1 IMPLANT
PACK TOTAL KNEE (MISCELLANEOUS) ×1 IMPLANT
PAD ARMBOARD POSITIONER FOAM (MISCELLANEOUS) ×3 IMPLANT
PAD COLD UNI WRAP-ON (PAD) ×1 IMPLANT
PENCIL SMOKE EVACUATOR (MISCELLANEOUS) ×1 IMPLANT
PIN DRILL HDLS TROCAR 75 4PK (PIN) IMPLANT
SCREW FEMALE HEX FIX 25X2.5 (ORTHOPEDIC DISPOSABLE SUPPLIES) IMPLANT
SCREW HEX HEADED 3.5X27 DISP (ORTHOPEDIC DISPOSABLE SUPPLIES) IMPLANT
SLEEVE SCD COMPRESS KNEE MED (STOCKING) ×1 IMPLANT
SOL .9 NS 3000ML IRR UROMATIC (IV SOLUTION) ×1 IMPLANT
SOLUTION IRRIG SURGIPHOR (IV SOLUTION) ×1 IMPLANT
STEM TIB PS KNEE D 0D RT (Stem) IMPLANT
STOCKINETTE IMPERV 14X48 (MISCELLANEOUS) ×1 IMPLANT
SUT STRATAFIX 14 PDO 36 VLT (SUTURE) ×1 IMPLANT
SUT VIC AB 0 CT1 36 (SUTURE) ×1 IMPLANT
SUT VIC AB 2-0 CT2 27 (SUTURE) ×2 IMPLANT
SUTURE STRATA SPIR 4-0 18 (SUTURE) ×1 IMPLANT
SUTURE VICRYL 1-0 27IN ABS (SUTURE) ×1 IMPLANT
SYR 20ML LL LF (SYRINGE) ×2 IMPLANT
TAPE CLOTH 3X10 WHT NS LF (GAUZE/BANDAGES/DRESSINGS) ×1 IMPLANT
TIP FAN IRRIG PULSAVAC PLUS (DISPOSABLE) ×1 IMPLANT
TRAP FLUID SMOKE EVACUATOR (MISCELLANEOUS) ×1 IMPLANT
WATER STERILE IRR 1000ML POUR (IV SOLUTION) ×1 IMPLANT

## 2023-07-30 NOTE — Evaluation (Signed)
 Physical Therapy Evaluation Patient Details Name: Samantha Doyle MRN: [id number] DOB: December 11, 1975 Today's Date: 07/30/2023  History of Present Illness  48 y/o female s/p R TKA 7/24.  Clinical Impression  Pt pleasant and motivated with PT session, she was eager to do all she could with exercises, mobility and gait.  She ultimately did well with POD0 expectations, she did not need direct assist with bed mobility or getting to standing (both taking some extra cuing and time) and managed to ambulate ~100 ft with gradually improving cadence, speed and confidence. Pt notes that pain is less now than pre-surgery and had tears of joy 2/2 this.  Overall pt doing well, will benefit from continued PT per TKA protocols.       If plan is discharge home, recommend the following: A little help with walking and/or transfers;A little help with bathing/dressing/bathroom;Assistance with cooking/housework;Assist for transportation;Help with stairs or ramp for entrance   Can travel by private vehicle        Equipment Recommendations Rolling walker (2 wheels);BSC/3in1  Recommendations for Other Services       Functional Status Assessment Patient has had a recent decline in their functional status and demonstrates the ability to make significant improvements in function in a reasonable and predictable amount of time.     Precautions / Restrictions Precautions Precautions: Fall Recall of Precautions/Restrictions: Intact Restrictions Weight Bearing Restrictions Per Provider Order: Yes RLE Weight Bearing Per Provider Order: Weight bearing as tolerated      Mobility  Bed Mobility Overal bed mobility: Needs Assistance Bed Mobility: Supine to Sit     Supine to sit: Supervision     General bed mobility comments: need L LE and UEs to assist R LE off EOB    Transfers Overall transfer level: Needs assistance Equipment used: Rolling walker (2 wheels) Transfers: Sit to/from Stand Sit to Stand:  Contact guard assist           General transfer comment: struggled initially to get weight shifted forward, with increased cuing and set up she was able to rise from bed and commode w/o direct assist    Ambulation/Gait Ambulation/Gait assistance: Supervision Gait Distance (Feet): 100 Feet Assistive device: Rolling walker (2 wheels)         General Gait Details: Pt with initially slow and guarded gait, but able to increase confidence, speed, cadence to consistent walker momentum with increased effort  Stairs            Wheelchair Mobility     Tilt Bed    Modified Rankin (Stroke Patients Only)       Balance Overall balance assessment: Modified Independent                                           Pertinent Vitals/Pain Pain Assessment Pain Assessment: 0-10 Pain Score: 5  Pain Location: R knee, reports pain in knee is much better than pre-surgery    Home Living Family/patient expects to be discharged to:: Private residence Living Arrangements: Spouse/significant other Available Help at Discharge: Available 24 hours/day (husband will be home through the weekend, father lives below)   Home Access: Stairs to enter Entrance Stairs-Rails: Right;Left (wide) Entrance Stairs-Number of Steps: 4   Home Layout: One level Home Equipment: Rollator (4 wheels);Cane - single point      Prior Function Prior Level of Function : Independent/Modified Independent;Working/employed  Mobility Comments: has needed a SPC recently, works as Artist and stays active ADLs Comments: independent     Extremity/Trunk Assessment   Upper Extremity Assessment Upper Extremity Assessment: Overall WFL for tasks assessed    Lower Extremity Assessment Lower Extremity Assessment: Overall WFL for tasks assessed (expectred post-op weakness in R LE)       Communication   Communication Communication: No apparent difficulties    Cognition  Arousal: Alert Behavior During Therapy: WFL for tasks assessed/performed   PT - Cognitive impairments: No apparent impairments                         Following commands: Intact       Cueing       General Comments General comments (skin integrity, edema, etc.): Pt was tearful with happiness about how her knee feels better than before surgery already    Exercises Total Joint Exercises Ankle Circles/Pumps: AROM, 10 reps Quad Sets: Strengthening, 10 reps Short Arc Quad: AROM, Strengthening, 5 reps Heel Slides: AROM, 5 reps (with resitsed leg ext) Hip ABduction/ADduction: AROM, AAROM, 10 reps Straight Leg Raises: AAROM, 5 reps Knee Flexion: 5 reps, AROM Goniometric ROM: 1-86   Assessment/Plan    PT Assessment Patient needs continued PT services  PT Problem List Decreased strength;Decreased range of motion;Decreased activity tolerance;Decreased balance;Decreased mobility;Decreased knowledge of use of DME;Decreased safety awareness;Pain       PT Treatment Interventions DME instruction;Gait training;Stair training;Functional mobility training;Therapeutic activities;Therapeutic exercise;Balance training;Patient/family education    PT Goals (Current goals can be found in the Care Plan section)  Acute Rehab PT Goals Patient Stated Goal: get back to work PT Goal Formulation: With patient/family Time For Goal Achievement: 08/12/23 Potential to Achieve Goals: Good    Frequency BID     Co-evaluation               AM-PAC PT 6 Clicks Mobility  Outcome Measure Help needed turning from your back to your side while in a flat bed without using bedrails?: None Help needed moving from lying on your back to sitting on the side of a flat bed without using bedrails?: None Help needed moving to and from a bed to a chair (including a wheelchair)?: A Little Help needed standing up from a chair using your arms (e.g., wheelchair or bedside chair)?: A Little Help needed to  walk in hospital room?: A Little Help needed climbing 3-5 steps with a railing? : A Little 6 Click Score: 20    End of Session Equipment Utilized During Treatment: Gait belt Activity Tolerance: Patient tolerated treatment well Patient left: in chair;with call bell/phone within reach;with family/visitor present Nurse Communication: Mobility status PT Visit Diagnosis: Muscle weakness (generalized) (M62.81);Difficulty in walking, not elsewhere classified (R26.2);Pain Pain - Right/Left: Right Pain - part of body: Knee    Time: 1725-1806 PT Time Calculation (min) (ACUTE ONLY): 41 min   Charges:   PT Evaluation $PT Eval Low Complexity: 1 Low PT Treatments $Gait Training: 8-22 mins $Therapeutic Exercise: 8-22 mins PT General Charges $$ ACUTE PT VISIT: 1 Visit         Carmin JONELLE Deed, DPT 07/30/2023, 6:38 PM

## 2023-07-30 NOTE — Discharge Summary (Signed)
 Physician Discharge Summary  Patient ID: Samantha Doyle MRN: [id number] DOB/AGE: 49/01/1975 49 y.o.  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 06/18/2018   Lumbosacral spondylosis without myelopathy 06/18/2018   Depression    Spondylolisthesis at L4-L5 level 06/12/2016   Stiffness of finger joint 05/10/2015   Mallet deformity of fourth finger, left 02/22/2015   Migraine 02/16/2015   GERD (gastroesophageal reflux disease) 02/16/2015   Tobacco dependence 02/16/2015   Vitamin B 12 deficiency 02/16/2015   Lapband APS 2009 [place]  05/16/2012   Encounter for fitting or adjustment of gastric lap band 05/13/2012   Anxiety 02/08/2011   Essential (primary) hypertension 02/08/2011    Past Medical History:  Diagnosis Date   Allergic rhinitis    Bilateral primary osteoarthritis of knee    severe   Carpal tunnel syndrome    right wrist, numb all the time   Cervical dysplasia    Chronic back pain    Degenerative lumbar spondylosis with grade 2 spondylolisthesis L4 on L5 with bilater pars defects L4.    COVID-19 virus infection 02/06/2020   DDD (degenerative disc disease), lumbar    MRI 10/2015: grade I (7mm) anterolisthesis L4 on L5, w/ disc herniation with encroachment on spinal nerves at L4-S1 levels.   Depression    DVT, lower extremity, recurrent (HCC)    09/2022, bilat->eliquis  indef   Edema of both lower extremities due to peripheral venous insufficiency    +varicose veins (hx of vein stripping 2009)   GERD (gastroesophageal reflux disease)    worsened  07/2018 likely associated with lap band being too constricting->increased pantoprazole  to 40mg  bid at that time.   History of chronic bronchitis    hx of when she was a smoker: ? mild intermittent asthma?--much better since stopped smoking 2018.   History of fracture of phalanx of finger    left, 4th finger distal phalanx   History of non anemic vitamin B12 deficiency    s/p lap band surgery per pt report.   Hyperlipidemia    Hypertension    Iron  deficiency anemia 01/2019   hemoccults ordered but never turned in (01/2019-01/2020). Iron  supp started 02/2020. Hemoccults neg 07/2020, plan for iron  infusion. 08/2020 EGD with SB bx normal, no source of IDA seen on colonoscopy. Iron  infusion 09/06/2020   LAP-BAND surgery status    Pt getting lap band removed and is getting gastric sleave after.   Migraine syndrome    Ovarian cyst 10/2015   LEFT, 4 cm--noted on L spine MRI w/out contrast.  F/u u/s imaging showed simple cyst.   Right tibial fracture    Fragility fracture 2024/2025-->calcitonin + bisphosphonate per ortho   Spondylarthrosis    Tobacco dependence in remission    quit 2018     Transfusion: none   Consultants (if any):   Discharged Condition: Improved  Hospital Course: Samantha Doyle is an 48 y.o. female who was admitted 07/30/2023 with a diagnosis of S/P total knee arthroplasty, right and went to the operating room on 07/30/2023 and underwent the above named procedures.    Surgeries: Procedure(s): ARTHROPLASTY, KNEE, TOTAL on 07/30/2023 Patient tolerated the  surgery well. Taken to PACU where she was stabilized and then transferred to the orthopedic floor.  Started on eliquis  2.5 mg BID,  TEDs and SCDs applied bilaterally. Heels elevated on bed. No evidence of DVT. Negative Homan. Physical therapy started on day #1 for gait training and transfer. OT started day #1 for ADL and assisted devices.  Patient's IV was d/c on day #1. Patient was able to safely and independently complete all  PT goals. PT recommending discharge to home.    On post op day #1 patient was stable and ready for discharge to home with HHPT.  Implants: Components/Implants: Femur: Persona Size 5 PPS  Tibia: Persona Size D OsseoTi  Poly: 11mm MC  Patella: 29x9 mm osseoti    She was given perioperative antibiotics:  Anti-infectives (From admission, onward)    Start     Dose/Rate Route Frequency Ordered Stop   07/30/23 1630  ceFAZolin  (ANCEF ) IVPB 2g/100 mL premix        2 g 200 mL/hr over 30 Minutes Intravenous Every 6 hours 07/30/23 1251 07/31/23 0429   07/30/23 0915  ceFAZolin  (ANCEF ) IVPB 2g/100 mL premix        2 g 200 mL/hr over 30 Minutes Intravenous On call to O.R. 07/30/23 0911 07/30/23 1112     .  She was given sequential compression devices, early ambulation, and *** for DVT prophylaxis.  She benefited maximally from the hospital stay and there were no complications.    Recent vital signs:  Vitals:   07/30/23 1502 07/30/23 1700  BP: 118/86 130/82  Pulse: 71 75  Resp: 16 15  Temp: 98.3 F (36.8 C) (!) 96.8 F (36 C)  SpO2: 99% 98%    Recent laboratory studies:  Lab Results  Component Value Date   HGB 9.8 (L) 07/24/2023   HGB 10.3 (L) 05/27/2023   HGB 11.6 (L) 10/22/2022   Lab Results  Component Value Date   WBC 5.3 07/24/2023   PLT 243 07/24/2023   Lab Results  Component Value Date   INR 1.1 (H) 04/30/2023   Lab Results  Component Value Date   NA 137 07/24/2023   K 3.4 (L) 07/24/2023   CL 100 07/24/2023   CO2 28 07/24/2023   BUN 10 07/24/2023   CREATININE 0.62 07/24/2023   GLUCOSE 93 07/24/2023    Discharge Medications:   Allergies as of 07/30/2023       Reactions   Meperidine Hives, Nausea And Vomiting   Vancomycin  Anaphylaxis, Itching   Face/back warm and red.  Pt lips and tongue swelled, hives   Demerol Hives, Nausea And Vomiting   Microplegia Msa-msg  [cardioplegia [place] Formula] Other (See Comments)   Other reaction(s): headache   Monosodium  Glutamate Nausea And Vomiting, Other (See Comments)   migraines   Tape Rash   Plastic tape     Med Rec must be completed prior to using this [facility]***       Diagnostic Studies: DG Knee 1-2 Views Right Result Date: 07/30/2023 CLINICAL DATA:  Postop. EXAM: RIGHT KNEE - 1-2 VIEW COMPARISON:  None Available. FINDINGS: Right knee total arthroplasty in expected alignment. No periprosthetic lucency or fracture. Recent postsurgical change includes air and edema in the soft tissues and joint space. IMPRESSION: Right knee arthroplasty without immediate postoperative complication. Electronically Signed   By: Andrea Gasman M.D.   On: 07/30/2023 13:06   PERIPHERAL VASCULAR CATHETERIZATION Result Date: 07/27/2023 See surgical note for result.   Disposition:  There are  no questions and answers to display.           Follow-up Information     Charlene Debby BROCKS, PA-C Follow up in 2 week(s).   Specialties: Orthopedic Surgery, Emergency Medicine Contact information: [address] [place] [address] [phone]                  Signed: Debby BROCKS Charlene 07/30/2023, 6:39 PM

## 2023-07-30 NOTE — Transfer of Care (Signed)
 Immediate Anesthesia Transfer of Care Note  Patient: Samantha Doyle  Procedure(s) Performed: ARTHROPLASTY, KNEE, TOTAL (Right: Knee)  Patient Location: PACU  Anesthesia Type:Spinal  Level of Consciousness: awake, alert , and oriented  Airway & Oxygen Therapy: Patient Spontanous Breathing  Post-op Assessment: Report given to RN and Post -op Vital signs reviewed and stable  Post vital signs: Reviewed and stable  Last Vitals:  Vitals Value Taken Time  BP 98/64 07/30/23 12:30  Temp 36.8 C 07/30/23 12:25  Pulse 64 07/30/23 12:33  Resp 14 07/30/23 12:33  SpO2 94 % 07/30/23 12:33  Vitals shown include unfiled device data.  Last Pain:  Vitals:   07/30/23 1225  TempSrc:   PainSc: 0-No pain         Complications: No notable events documented.

## 2023-07-30 NOTE — Interval H&P Note (Signed)
Patient history and physical updated. Consent reviewed including risks, benefits, and alternatives to surgery. Patient agrees with above plan to proceed with right total knee arthroplasty  

## 2023-07-30 NOTE — Anesthesia Preprocedure Evaluation (Signed)
 Anesthesia Evaluation  Patient identified by MRN, date of birth, ID band Patient awake    Reviewed: Allergy & Precautions, NPO status , Patient's Chart, lab work & pertinent test results  Airway Mallampati: III  TM Distance: >3 FB Neck ROM: full    Dental  (+) Chipped, Dental Advidsory Given   Pulmonary neg pulmonary ROS, former smoker   Pulmonary exam normal        Cardiovascular hypertension, On Medications negative cardio ROS Normal cardiovascular exam     Neuro/Psych  PSYCHIATRIC DISORDERS Anxiety Depression     Neuromuscular disease    GI/Hepatic Neg liver ROS,GERD  ,,  Endo/Other    Class 3 obesity  Renal/GU      Musculoskeletal   Abdominal   Peds  Hematology negative hematology ROS (+)   Anesthesia Other Findings Patient with PMH of back surgery on L4-S1. Will attempt spinal at L3-4 but explained to the patient that there is an increased chance of failure of spinal placement and requirement of an ETT for the procedure. Patient states she is aware and willing to attempt the spinal.   Past Medical History: No date: Allergic rhinitis No date: Bilateral primary osteoarthritis of knee     Comment:  severe No date: Carpal tunnel syndrome     Comment:  right wrist, numb all the time No date: Cervical dysplasia No date: Chronic back pain     Comment:  Degenerative lumbar spondylosis with grade 2               spondylolisthesis L4 on L5 with bilater pars defects L4.  02/06/2020: COVID-19 virus infection No date: DDD (degenerative disc disease), lumbar     Comment:  MRI 10/2015: grade I (7mm) anterolisthesis L4 on L5, w/               disc herniation with encroachment on spinal nerves at               L4-S1 levels. No date: Depression No date: DVT, lower extremity, recurrent (HCC)     Comment:  09/2022, bilat->eliquis  indef No date: Edema of both lower extremities due to peripheral venous  insufficiency      Comment:  +varicose veins (hx of vein stripping 2009) No date: GERD (gastroesophageal reflux disease)     Comment:  worsened 07/2018 likely associated with lap band being               too constricting->increased pantoprazole  to 40mg  bid at               that time. No date: History of chronic bronchitis     Comment:  hx of when she was a smoker: ? mild intermittent               asthma?--much better since stopped smoking 2018. No date: History of fracture of phalanx of finger     Comment:  left, 4th finger distal phalanx No date: History of non anemic vitamin B12 deficiency     Comment:  s/p lap band surgery per pt report. No date: Hyperlipidemia No date: Hypertension 01/2019: Iron  deficiency anemia     Comment:  hemoccults ordered but never turned in (01/2019-01/2020).               Iron  supp started 02/2020. Hemoccults neg 07/2020, plan for              iron  infusion. 08/2020 EGD with SB bx normal, no source of  IDA seen on colonoscopy. Iron  infusion 09/06/2020 No date: LAP-BAND surgery status     Comment:  Pt getting lap band removed and is getting gastric               sleave after. No date: Migraine syndrome 10/2015: Ovarian cyst     Comment:  LEFT, 4 cm--noted on L spine MRI w/out contrast.  F/u               u/s imaging showed simple cyst. No date: Right tibial fracture     Comment:  Fragility fracture 2024/2025-->calcitonin +               bisphosphonate per ortho No date: Spondylarthrosis No date: Tobacco dependence in remission     Comment:  quit 2018  Past Surgical History: 2019: BACK SURGERY No date: CARPAL TUNNEL RELEASE; Left No date: CERVICAL CONE BIOPSY No date: COLONOSCOPY     Comment:  2015 normal.  08/2020 (for IDA)->incomplete prep, no               adenomas->rpt 5 yrs d/t incomplete prep. 04/27/2014: ESOPHAGOGASTRODUODENOSCOPY; N/A     Comment:  Procedure: ESOPHAGOGASTRODUODENOSCOPY (EGD);  Surgeon:               Alm Angle, MD;  Location: [facility];  Service:               General;  Laterality: N/A; 08/29/2020: ESOPHAGOGASTRODUODENOSCOPY     Comment:  6 cm hiatal hernia noted, o/w normal, SB bx neg.                ?hernia contributing to pt's IDA? 10/02/2014: GASTRIC BANDING PORT REVISION; N/A     Comment:  Procedure: LAPROSCOPIC PLACEMENT OF LAP BAND PORT;                Surgeon: Morene Olives, MD;  Location: [facility];                Service: General;  Laterality: N/A; 07/27/2023: IVC FILTER INSERTION; N/A     Comment:  Procedure: IVC FILTER INSERTION;  Surgeon: Marea Selinda RAMAN,              MD;  Location: [facility];  Service:               Cardiovascular;  Laterality: N/A; 03/2007: LAPAROSCOPIC GASTRIC BANDING     Comment:  [facility] - [facility]; Dr Velinda Hipp 05/17/2012: LAPAROSCOPIC REVISION OF GASTRIC BAND; N/A     Comment:  Procedure: LAPAROSCOPIC REVISION OF SLIPPED GASTRIC               BAND;  Surgeon: Morene ONEIDA Olives, MD;  Location: [facility];  Service: General;  Laterality: N/A; 04/30/2014: LAPAROSCOPY; N/A     Comment:  Procedure: DIAGNOSTIC LAPAROSCOPY WITH REMOVAL OF               INFECTED LAP BAND PORT WITH DEBRIDEMENT SUBCUTANEOUS               ABSCESS;  Surgeon: Camellia Blush, MD;  Location: [facility];                Service: General;  Laterality: N/A; 2009: VARICOSE VEIN SURGERY; Bilateral  BMI    Body Mass Index: 42.51 kg/m      Reproductive/Obstetrics negative OB ROS  Anesthesia Physical Anesthesia Plan  ASA: 3  Anesthesia Plan: Spinal   Post-op Pain Management:    Induction:   PONV Risk Score and Plan: 3 and Propofol  infusion, TIVA, Midazolam , Ondansetron  and Dexamethasone   Airway Management Planned: Natural Airway and Nasal Cannula  Additional Equipment:   Intra-op Plan:   Post-operative Plan:   Informed Consent: I have reviewed the patients History and Physical, chart, labs and discussed  the procedure including the risks, benefits and alternatives for the proposed anesthesia with the patient or authorized representative who has indicated his/her understanding and acceptance.     Dental Advisory Given  Plan Discussed with: Anesthesiologist, CRNA and Surgeon  Anesthesia Plan Comments: (Patient reports no bleeding problems and no anticoagulant use.  Plan for spinal with backup GA  Patient consented for risks of anesthesia including but not limited to:  - adverse reactions to medications - damage to eyes, teeth, lips or other oral mucosa - nerve damage due to positioning  - risk of bleeding, infection and or nerve damage from spinal that could lead to paralysis - risk of headache or failed spinal - damage to teeth, lips or other oral mucosa - sore throat or hoarseness - damage to heart, brain, nerves, lungs, other parts of body or loss of life  Patient voiced understanding and assent.)        Anesthesia Quick Evaluation

## 2023-07-30 NOTE — H&P (Signed)
 History of Present Illness: The patient is an 48 y.o. female seen in clinic today for follow-up evaluation of her right knee prior to planned right knee surgical intervention. Patient reports ongoing 10 out of 10 pain in her right knee over the anterior lateral aspect of the knee which limit her function. She has had diminishing short-term relief from injections and other conservative treatments. She is continue to work on weight loss and kept her BMI down. She is here today to discuss moving forward with right knee surgical intervention. The patient denies fevers, chills, numbness, tingling, shortness of breath, chest pain, recent illness, or any trauma.  Past Medical History: Past Medical History:  Diagnosis Date  Allergy  Anxiety 02/08/2011  Arthritis  Chronic pain syndrome 06/18/2018  Degeneration of lumbar intervertebral disc 07/21/2018  Depression  Essential (primary) hypertension 02/08/2011  GERD (gastroesophageal reflux disease) 02/16/2015  History of abnormal cervical Pap smear  Hypertension  Migraine 02/16/2015  Sleep apnea  Tobacco dependence 02/16/2015  Vitamin B 12 deficiency 02/16/2015   Past Surgical History: Past Surgical History:  Procedure Laterality Date  LAP BAND SURGERY   Past Family History: Family History  Problem Relation Age of Onset  Obesity Mother  High blood pressure (Hypertension) Mother  Hyperlipidemia (Elevated cholesterol) Mother  Diabetes Mother  Deep vein thrombosis (DVT or abnormal blood clot formation) Mother  Obesity Father  High blood pressure (Hypertension) Father  Hyperlipidemia (Elevated cholesterol) Father  Coronary Artery Disease (Blocked arteries around heart) Father  Obesity Sister  High blood pressure (Hypertension) Sister  Hyperlipidemia (Elevated cholesterol) Sister  Obesity Brother  Coronary Artery Disease (Blocked arteries around heart) Maternal Grandmother  Obesity Maternal Grandmother  Stroke Maternal Grandmother  Breast cancer  Maternal Grandmother  Colon cancer Maternal Grandfather  Obesity Maternal Grandfather  Stroke Maternal Grandfather  Stroke Paternal Grandmother  Obesity Paternal Grandmother  Stroke Paternal Grandfather  Obesity Paternal Grandfather   Medications: Current Outpatient Medications  Medication Sig Dispense Refill  albuterol  90 mcg/actuation inhaler Inhale into the lungs  alendronate (FOSAMAX) 10 MG tablet Take 1 tablet (10 mg total) by mouth every morning before breakfast Take on an empty stomach with a full glass of water . Avoid mineral or well water . Do not eat or take other medications for at least 30 minutes after dose. Sit or stand for at least 30 minutes after dose. 30 tablet 2  apixaban  (ELIQUIS ) 5 mg tablet Take 5 mg by mouth 2 (two) times daily (Patient not taking: Reported on 05/05/2023)  ascorbic acid, vitamin C, (VITAMIN C) 500 MG tablet Take by mouth  baclofen (LIORESAL) 10 MG tablet TAKE 1 TABLET BY MOUTH TWICE A DAY WITH FOOD OR MILK  baclofen (LIORESAL) 20 MG tablet TAKE ONE-HALF TO 1 TABLET BY MOUTH EVERY 8 HOURS WITH FOOD OR MILK  buPROPion  (WELLBUTRIN  SR) 150 MG SR tablet  calcitonin, salmon, (MIACALCIN) 200 unit/actuation nasal spray Place 1 spray into the left nostril once daily for 90 days Alternate nares each day 3.7 mL 2  cetirizine (ZYRTEC) 10 MG tablet Take by mouth  clonazePAM  (KLONOPIN ) 0.5 MG tablet  diclofenac  (VOLTAREN ) 75 MG EC tablet 75 mg 2 (two) times daily  docusate calcium (SURFAK) 240 mg capsule Take by mouth 2 (two) times daily as needed  DULoxetine  (CYMBALTA ) 30 MG DR capsule 2 caps po qd  FUROsemide  (LASIX ) 20 MG tablet Take 20 mg by mouth once daily  gabapentin  (NEURONTIN ) 800 MG tablet 1 tablet  lisinopriL  (ZESTRIL ) 40 MG tablet 1 tablet  methylphenidate  HCl (  CONCERTA ) 18 MG ER tablet Take 18 mg by mouth once daily  methylPREDNISolone  (MEDROL  DOSEPACK) 4 mg tablet  naloxone (NARCAN) 4 mg/actuation nasal spray  oxyCODONE  (ROXICODONE ) 15 MG immediate  release tablet 1 tablet  pantoprazole  (PROTONIX ) 40 MG DR tablet TAKE 1 TABLET BY MOUTH EVERY DAY  promethazine  (PHENERGAN ) 25 MG tablet Take 1 tablet (25 mg total) by mouth every 8 (eight) hours as needed for Nausea or Vomiting 21 tablet 0  rizatriptan  (MAXALT -MLT) 5 MG disintegrating tablet Take by mouth  triamcinolone  (NASACORT  AQ) 55 mcg nasal spray Place into one nostril  warfarin (COUMADIN ) 2.5 MG tablet Take as directed by MD   No current facility-administered medications for this visit.   Allergies: Allergies  Allergen Reactions  Vancomycin  Anaphylaxis, Itching, Unknown and Rash  Face/back warm and red. Pt lips and tongue swelled, hives Face/back warm and red. Pt lips and tongue swelled, hives  Meperidine Hives, Nausea And Vomiting, Unknown and Other (See Comments)  Monosodium Glutamate Nausea And Vomiting, Unknown and Other (See Comments)  migraines migraines migraine  Cardioplegic Soln Unknown  Other reaction(s): headache  Cardioplegic Solution No.16 Other (See Comments)  Other reaction(s): headache  Adhesive Rash  Plastic tape    Visit Vitals: Vitals:  07/24/23 0848  BP: 120/86    Review of Systems:  A comprehensive 14 point ROS was performed, reviewed, and the pertinent orthopaedic findings are documented in the HPI. Physical Exam: General/Constitutional: No apparent distress: well-nourished and well developed. Eyes: Pupils equal, round with synchronous movement. Pulmonary exam: Lungs clear to auscultation bilaterally no wheezing rales or rhonchi Cardiac exam: Regular rate and rhythm no obvious murmurs rubs or gallops. Integumentary: No impressive skin lesions present, except as noted in detailed exam. Neuro/Psych: Normal mood and affect, oriented to person, place and time.  Comprehensive Knee Exam: Gait Antalgic  Alignment Neutral   Inspection Right Left  Skin Normal appearance with no obvious deformity. No ecchymosis or erythema. Normal appearance  with no obvious deformity. No ecchymosis or erythema.  Soft Tissue No focal soft tissue swelling No focal soft tissue swelling  Quad Atrophy None None   Palpation  Right Left  Tenderness Lateral joint line tenderness to palpation no tenderness over the medial tibia Medial joint line tenderness palpation  Crepitus no patellofemoral or tibiofemoral crepitus + patellofemoral crepitus  Effusion None None   Range of Motion Right Left  Flexion 0-100 0-100  Extension Full knee extension without hyperextension Full knee extension without hyperextension   Ligamentous Exam Right Left  Lachman Normal Normal  Valgus 0 Normal Normal  Valgus 30 Normal Normal  Varus 0 Normal Normal  Varus 30 Normal Normal  Anterior Drawer Normal Normal  Posterior Drawer Normal Normal   Meniscal Exam Right Left  Hyperflexion Test Positive Positive  Hyperextension Test Positive Positive  McMurray's Negative Negative   Neurovascular Right Left  Quadriceps Strength 5/5 5/5  Hamstring Strength 5/5 5/5  Hip Abductor Strength 4/5 4/5  Distal Motor Normal Normal  Distal Sensory Normal light touch sensation Normal light touch sensation  Distal Pulses Normal Normal    Imaging Studies:  I have reviewed AP, lateral,sunrise, and flexed PA weight bearing knee X-rays (4 views) of the right knee taken at the previous office visit show lateral joint space narrowing with osteophyte formation and sclerosis as well as significant patellofemoral degenerative changes with joint space narrowing sclerosis and osteophyte formation. No evidence of any displaced fracture lines within the medial tibia. AP, flexed PA, and sunrise views of the  left knee show medial joint space narrowing and severe patellofemoral degeneration unchanged from prior films. No fractures or dislocations noted on these films today.    MRI of the right knee performed on 11/06/2022 images and report reviewed by myself without contrast. There is intact  medial meniscus there is complex macerated tearing of the anterior horn the lateral meniscus. ACL and PCL are intact MCL and LCL are intact. There is moderate to severe partial-thickness cartilage loss throughout the entire patellofemoral compartment high-grade partial thickness loss throughout the medial compartment and full-thickness loss in the lateral compartment with subchondral marrow edema but no noted fracture lines. Extensor mechanism is intact. There is a line within the proximal anterior and medial tibial metaphysis with some bone marrow edema concerning for a nondisplaced fracture. As well as edema within the gastrocnemius muscle concerning for a small tear as well as a complex Baker's cyst. Agree with radiologist interpretation.  Narrative  CLINICAL DATA: Right knee pain. Ongoing for 2 months.  EXAM: MRI OF THE RIGHT KNEE WITHOUT CONTRAST  TECHNIQUE: Multiplanar, multisequence MR imaging of the knee was performed. No intravenous contrast was administered.  COMPARISON: None Available.  FINDINGS: MENISCI  Medial: Intact.  Lateral: Maceration of the anterior horn of the lateral meniscus and a horizontal tear of the body of the lateral meniscus extending to the free edge.  LIGAMENTS  Cruciates: ACL and PCL are intact.  Collaterals: Medial collateral ligament is intact. Lateral collateral ligament complex is intact.  CARTILAGE  Patellofemoral: Moderate partial-thickness cartilage loss of the patellofemoral compartment.  Medial: High-grade partial-thickness cartilage loss of the medial femorotibial compartment weight-bearing surface.  Lateral: Extensive full-thickness cartilage loss of the lateral femorotibial compartment with severe subchondral marrow edema particularly in the lateral femoral condyle which may reflect an element of underlying stress reaction.  JOINT: Small joint effusion. Normal Hoffa's fat-pad. Thickened medial patellar plica. 4 mm loose body in  the suprapatellar recess.  POPLITEAL FOSSA: Popliteus tendon is intact. Multiloculated complex Baker's cyst likely reflecting a hemorrhagic Baker's cyst extending caudally along the superficial aspect of the medial gastrocnemius muscle.  EXTENSOR MECHANISM: Intact quadriceps tendon. Intact patellar tendon. Intact lateral patellar retinaculum. Intact medial patellar retinaculum. Intact MPFL.  BONES: Nondisplaced fracture of the proximal anteromedial tibial metaphysis with severe surrounding bone marrow edema.  Other: Mild muscle edema in the proximal medial gastrocnemius muscle concerning for a small partial-thickness tear. Soft tissue edema between the distal iliotibial band and lateral femoral condyle as can be seen with iliotibial band syndrome versus reactive soft tissue edema secondary to bone marrow edema in the lateral femoral condyle.  IMPRESSION: 1. Nondisplaced fracture of the proximal anteromedial tibial metaphysis with severe surrounding bone marrow edema. 2. Maceration of the anterior horn of the lateral meniscus and a horizontal tear of the body of the lateral meniscus extending to the free edge. 3. Tricompartmental cartilage abnormalities as described above most severe in the lateral femorotibial compartment. 4. Multiloculated complex Baker's cyst likely reflecting a hemorrhagic Baker's cyst extending caudally along the superficial aspect of the medial gastrocnemius muscle. 5. Mild muscle edema in the proximal medial gastrocnemius muscle concerning for a small partial-thickness tear. 6. Soft tissue edema between the distal iliotibial band and lateral femoral condyle as can be seen with iliotibial band syndrome versus reactive soft tissue edema secondary to bone marrow edema in the lateral femoral condyle.  Electronically Signed By: Julaine Blanch M.D. On: 11/18/2022 06:12  Assessment:  Right knee osteoarthritis  Plan: Gunda is a 48 year old female  presents  with right severe degenerative changes . Based upon the patient's continued symptoms and failure to respond to conservative treatment, I have recommended a right total knee replacement for this patient. A long discussion took place with the patient describing what a total joint replacement is and what the procedure would entail. A knee model, similar to the implants that will be used during the operation, was utilized to demonstrate the implants. Choices of implant manufactures were discussed and reviewed. The ability to secure the implant utilizing cement or cementless (press fit) fixation was discussed. The approach and exposure was discussed.   The hospitalization and post-operative care and rehabilitation were also discussed. The use of perioperative antibiotics and DVT prophylaxis were discussed. The risk, benefits and alternatives to a surgical intervention were discussed at length with the patient. The patient was also advised of risks related to the medical comorbidities and elevated body mass index (BMI). A lengthy discussion took place to review the most common complications including but not limited to: stiffness, loss of function, complex regional pain syndrome, deep vein thrombosis, pulmonary embolus, heart attack, stroke, infection, wound breakdown, numbness, intraoperative fracture, damage to nerves, tendon,muscles, arteries or other blood vessels, death and other possible complications from anesthesia. The patient was told that we will take steps to minimize these risks by using sterile technique, antibiotics and DVT prophylaxis when appropriate and follow the patient postoperatively in the office setting to monitor progress. The possibility of recurrent pain, no improvement in pain and actual worsening of pain were also discussed with the patient.   Patient asked about and confirms no history of any reactions to metal or metal allergy in the past.  The discharge plan of care focused on the  patient going home following surgery. The patient was encouraged to make the necessary arrangements to have someone stay with them when they are discharged home.   The benefits of surgery were discussed with the patient including the potential for improving the patient's current clinical condition through operative intervention. Alternatives to surgical intervention including continued conservative management were also discussed in detail. All questions were answered to the satisfaction of the patient. The patient participated and agreed to the plan of care as well as the use of the recommended implants for their total knee replacement surgery. An information packet was given to the patient to review prior to surgery.   She was given medical clearance for surgery and had an IVCF placed. All questions answered she agrees with the plan for a right total knee replacement.    Portions of this record have been created using Scientist, clinical (histocompatibility and immunogenetics). Dictation errors have been sought, but may not have been identified and corrected.  Arthea Sheer MD

## 2023-07-30 NOTE — Op Note (Signed)
 Patient Name: Samantha Doyle  FMW:[id number]  Pre-Operative Diagnosis: Right knee Osteoarthritis  Post-Operative Diagnosis: (same)  Procedure: Right Total Knee Arthroplasty  Components/Implants: Femur: Persona Size 5 PPS  Tibia: Persona Size D OsseoTi  Poly: 11mm MC  Patella: 29x9 mm osseoti  Femoral Valgus Cut Angle: 5 degrees  Distal Femoral Re-cut: none  Patella Resurfacing: yes   Date of Surgery: 07/30/2023  Surgeon: Arthea Sheer MD  Assistant: Debby Amber PA (present and scrubbed throughout the case, critical for assistance with exposure, retraction, instrumentation, and closure)   Anesthesiologist: Leavy  Anesthesia: Spinal   Tourniquet Time: 50 min  EBL: 50cc  IVF: 500cc  Complications: None   Brief history: The patient is a 48 year old female with a history of osteoarthritis of the right knee with pain limiting their range of motion and activities of daily living, which has failed multiple attempts at conservative therapy.  The risks and benefits of total knee arthroplasty as definitive surgical treatment were discussed with the patient, who opted to proceed with the operation.  After outpatient medical clearance and optimization was completed the patient was admitted to [facility] for the procedure.  All preoperative films were reviewed and an appropriate surgical plan was made prior to surgery. Preoperative range of motion was 0 to 100.   Description of procedure: The patient was brought to the operating room where laterality was confirmed by all those present to be the right side.   Spinal anesthesia was administered and the patient received an intravenous dose of antibiotics for surgical prophylaxis and a dose of tranexamic acid .  Patient is positioned supine on the operating room table with all bony prominences well-padded.  A well-padded tourniquet was applied to the right thigh.  The knee was then prepped and draped in usual  sterile fashion with multiple layers of adhesive and nonadhesive drapes.  All of those present in the operating room participated in a surgical timeout laterality and patient were confirmed.   An Esmarch was wrapped around the extremity and the leg was elevated and the knee flexed.  The tourniquet was inflated to a pressure of 250 mmHg. The Esmarch was removed and the leg was brought down to full extension.  The patella and tibial tubercle identified and outlined using a marking pen and a midline skin incision was made with a knife carried through the subcutaneous tissue down to the extensor retinaculum.  After exposure of the extensor mechanism the medial parapatellar arthrotomy was performed with a scalpel and electrocautery extending down medial and distal to the tibial tubercle taking care to avoid incising the patellar tendon.   A standard medial release was performed over the proximal tibia.  The knee was brought into extension in order to excise the fat pad taking care not to damage the patella tendon.  The superior soft tissue was removed from the anterior surface of the distal femur to visualize for the procedure.  The knee was then brought into flexion with the patella subluxed laterally and subluxing the tibia anteriorly.  The ACL was transected and removed with electrocautery and additional soft tissue was removed from the proximal surface of the tibia to fully expose. The PCL was found to be intact and was preserved.  An extramedullary tibial cutting guide was then applied to the leg with a spring-loaded ankle clamp placed around the distal tibia just above the malleoli the angulation of the guide was adjusted to give some posterior slope in the tibial resection with an appropriate varus/valgus  alignment.  The resection guide was then pinned to the proximal tibia and the proximal tibial surface was resected with an oscillating saw.  Careful attention was paid to ensure the blade did not disrupt any  of the soft tissues including any lateral or medial ligament.  Attention was then turned to the femur, with the knee slightly flexed a opening drill was used to enter the medullary canal of the femur.  After removing the drill marrow was suctioned out to decompress the distal femur.  An intramedullary femoral guide was then inserted into the drill hole and the alignment guide was seated firmly against the distal end of the medial femoral condyle.  The distal femoral cutting guide was then attached and pinned securely to the anterior surface of the femur and the intramedullary rod and alignment guide was removed.  Distal femur resection was then performed with an oscillating saw with retractors protecting medial and laterally.   The distal cutting block was then removed and the extension gap was checked with a spacer.  Extension gap was found to be appropriately sized to accommodate the spacer block.  The femoral sizing guide was then placed securely into the posterior condyles of the femur and the femoral size was measured and determined to be 5.  The size 5; 4-in-1 cutting guide was placed in position and secured with 2 pins.  The anterior posterior and chamfer resections were then performed with an oscillating saw.  Bony fragments and osteophytes were then removed.  Using a lamina spreader the posterior medial and lateral condyles were checked for additional osteophytes and posterior soft tissue remnants.  Any remaining meniscus was removed at this time.  Periarticular injection was performed in the meniscal rims and posterior capsule with aspiration performed to ensure no intravascular injection.   The tibia was then exposed and the tibial trial was pinned onto the plateau after confirming appropriate orientation and rotation.  Using the drill bushing the tibia was prepared to the appropriate drill depth.  Tibial broach impactor was then driven through the punch guide using a mallet.  The femoral trial  component was then inserted onto the femur. A trial tibial polyethylene bearing was then placed and the knee was reduced.  The knee achieved full extension with no hyperextension and was found to be balanced in flexion and extension with the trials in place.  The knee was then brought into full extension the patella was everted and held with 2 Kocher clamps.  The articular surface of the patella was then resected with an patella reamer and saw after careful measurement with a caliper.  The patella was then prepared with the drill guide and a trial patella was placed.  The knee was then taken through range of motion and it was found that the patella articulated appropriately with the trochlea and good patellofemoral motion without subluxation.    The correct final components for implantation were confirmed and opened by the circulator nurse.  The knee was irrigated with normal saline via pulsatile lavage to remove any bony debris or soft tissue.  The prepared surface of the tibia was exposed and the tibial component was implanted with good bony contact.  The femoral component was then placed and impacted showing good coverage and a good snug fit.  The patella was then cleared off and the patella compression tool was used to apply the patellar component with symmetric compression onto the patella.  The tibial component was then irrigated and cleared of any debris  and a real polyethylene component was placed and engaged with the locking mechanism.  The knee was then injected with the particular cocktail.  The knee was taken through range of motion and found to be stable in flexion and extension with patellar tracking.  The knee was then irrigated with copious amount of normal saline via pulsatile lavage to remove all loose bodies and other debris.  The knee was then irrigated with surgiphor betadine based wash and reirrigated with saline.  The tourniquet was then dropped and all bleeding vessels were identified and  coagulated.  The arthrotomy was approximated with #1 Vicryl and closed with #1 Stratafix suture.  The knee was brought into slight flexion and the subcutaneous tissues were closed with 0 Vicryl, 2-0 Vicryl and a running subcuticular 4-0 stratafix barbed suture.  Skin was then glued with Dermabond.  A sterile adhesive dressing was then placed along with a sequential compression device to the calf, a Ted stocking, and a cryotherapy cuff.   Sponge, needle, and Lap counts were all correct at the end of the case.   The patient was transferred off of the operating room table to a hospital bed, good pulses were found distally on the operative side.  The patient was transferred to the recovery room in stable condition.

## 2023-07-30 NOTE — Discharge Instructions (Signed)
 Instructions after Total Knee Replacement   Arthea Sheer M.D.     Dept. of Orthopaedics & Sports Medicine  [facility]  [address]  [place], [place]  [address]  Phone: [phone]   Fax: [phone]    DIET: Drink plenty of non-alcoholic fluids. Resume your normal diet. Include foods high in fiber.  ACTIVITY:  You may use crutches or a walker with weight-bearing as tolerated, unless instructed otherwise. You may be weaned off of the walker or crutches by your Physical Therapist.  Do NOT place pillows under the knee. Anything placed under the knee could limit your ability to straighten the knee.   Continue doing gentle exercises. Exercising will reduce the pain and swelling, increase motion, and prevent muscle weakness.   Please continue to use the TED compression stockings for 2 weeks. You may remove the stockings at night, but should reapply them in the morning. Do not drive or operate any equipment until instructed.  WOUND CARE:  Continue to use the PolarCare or ice packs periodically to reduce pain and swelling. You may begin showering 3 days after surgery with honeycomb dressing. Remove honeycomb dressing 7 days after surgery and continue showering. Allow dermabond to fall off on its own.  MEDICATIONS: You may resume your regular medications. Please take the pain medication as prescribed on the medication. Do not take pain medication on an empty stomach. You have been given a prescription for a blood thinner (Eliquis ). Please take the medication as instructed. Do not drive or drink alcoholic beverages when taking pain medications.  POSTOPERATIVE CONSTIPATION PROTOCOL Constipation - defined medically as fewer than three stools per week and severe constipation as less than one stool per week.  One of the most common issues patients have following surgery is constipation.  Even if you have a regular bowel pattern at home, your normal regimen is likely to be  disrupted due to multiple reasons following surgery.  Combination of anesthesia, postoperative narcotics, change in appetite and fluid intake all can affect your bowels.  In order to avoid complications following surgery, here are some recommendations in order to help you during your recovery period.  Colace (docusate) - Pick up an over-the-counter form of Colace or another stool softener and take twice a day as long as you are requiring postoperative pain medications.  Take with a full glass of water  daily.  If you experience loose stools or diarrhea, hold the colace until you stool forms back up.  If your symptoms do not get better within 1 week or if they get worse, check with your doctor.  Dulcolax (bisacodyl) - Pick up over-the-counter and take as directed by the product packaging as needed to assist with the movement of your bowels.  Take with a full glass of water .  Use this product as needed if not relieved by Colace only.   MiraLax (polyethylene glycol) - Pick up over-the-counter to have on hand.  MiraLax is a solution that will increase the amount of water  in your bowels to assist with bowel movements.  Take as directed and can mix with a glass of water , juice, soda, coffee, or tea.  Take if you go more than two days without a movement. Do not use MiraLax more than once per day. Call your doctor if you are still constipated or irregular after using this medication for 7 days in a row.  If you continue to have problems with postoperative constipation, please contact the office for further assistance and recommendations.  If you experience the worst abdominal pain ever or develop nausea or vomiting, please contact the office immediatly for further recommendations for treatment.   CALL THE OFFICE FOR: Temperature above 101 degrees Excessive bleeding or drainage on the dressing. Excessive swelling, coldness, or paleness of the toes. Persistent nausea and vomiting.  FOLLOW-UP:  You should  have an appointment to return to the office in 14 days after surgery. Arrangements have been made for continuation of Physical Therapy (either home therapy or outpatient therapy).

## 2023-07-31 ENCOUNTER — Encounter: Payer: Self-pay | Admitting: Orthopedic Surgery

## 2023-07-31 DIAGNOSIS — M1711 Unilateral primary osteoarthritis, right knee: Secondary | ICD-10-CM | POA: Diagnosis not present

## 2023-07-31 LAB — BASIC METABOLIC PANEL WITH GFR
Anion gap: 8 (ref 5–15)
BUN: 10 mg/dL (ref 6–20)
CO2: 27 mmol/L (ref 22–32)
Calcium: 8.3 mg/dL — ABNORMAL LOW (ref 8.9–10.3)
Chloride: 103 mmol/L (ref 98–111)
Creatinine, Ser: 0.56 mg/dL (ref 0.44–1.00)
GFR, Estimated: >60 mL/min (ref >60)
Glucose, Bld: 108 mg/dL — ABNORMAL HIGH (ref 70–99)
Potassium: 3.6 mmol/L (ref 3.5–5.1)
Sodium: 138 mmol/L (ref 135–145)

## 2023-07-31 LAB — CBC
HCT: 27.7 % — ABNORMAL LOW (ref 36.0–46.0)
Hemoglobin: 8.4 g/dL — ABNORMAL LOW (ref 12.0–15.0)
MCH: 22.8 pg — ABNORMAL LOW (ref 26.0–34.0)
MCHC: 30.3 g/dL (ref 30.0–36.0)
MCV: 75.3 fL — ABNORMAL LOW (ref 80.0–100.0)
Platelets: 216 K/uL (ref 150–400)
RBC: 3.68 MIL/uL — ABNORMAL LOW (ref 3.87–5.11)
RDW: 18.5 % — ABNORMAL HIGH (ref 11.5–15.5)
WBC: 10.3 K/uL (ref 4.0–10.5)
nRBC: 0 % (ref 0.0–0.2)

## 2023-07-31 MED ORDER — LISINOPRIL 20 MG PO TABS
ORAL_TABLET | ORAL | Status: AC
  Filled 2023-07-31: qty 2

## 2023-07-31 NOTE — Progress Notes (Signed)
 Subjective: 1 Day Post-Op Procedure(s) (LRB): ARTHROPLASTY, KNEE, TOTAL (Right) Patient reports pain as mild.   Patient is well, and has had no acute complaints or problems Denies any CP, SOB, ABD pain. We will continue therapy today.  Plan is to go Home after hospital stay.  Objective: Vital signs in last 24 hours: Temp:  [96.8 F (36 C)-98.7 F (37.1 C)] 98.7 F (37.1 C) (07/25 0743) Pulse Rate:  [58-99] 74 (07/25 0743) Resp:  [14-18] 14 (07/25 0743) BP: (98-138)/(45-88) 135/84 (07/25 0743) SpO2:  [91 %-99 %] 96 % (07/25 0743) Weight:  [108.9 kg] 108.9 kg (07/24 0925)  Intake/Output from previous day: 07/24 0701 - 07/25 0700 In: 945.8 [I.V.:567.3; IV Piggyback:378.5] Out: 1150 [Urine:1100; Blood:50] Intake/Output this shift: No intake/output data recorded.  Recent Labs    07/31/23 0538  HGB 8.4*   Recent Labs    07/31/23 0538  WBC 10.3  RBC 3.68*  HCT 27.7*  PLT 216   Recent Labs    07/31/23 0538  NA 138  K 3.6  CL 103  CO2 27  BUN 10  CREATININE 0.56  GLUCOSE 108*  CALCIUM 8.3*   No results for input(s): LABPT, INR in the last 72 hours.  EXAM General - Patient is Alert, Appropriate, and Oriented Extremity - Neurovascular intact Sensation intact distally Intact pulses distally Dorsiflexion/Plantar flexion intact Dressing - dressing C/D/I and no drainage Motor Function - intact, moving foot and toes well on exam.   Past Medical History:  Diagnosis Date   Allergic rhinitis    Bilateral primary osteoarthritis of knee    severe   Carpal tunnel syndrome    right wrist, numb all the time   Cervical dysplasia    Chronic back pain    Degenerative lumbar spondylosis with grade 2 spondylolisthesis L4 on L5 with bilater pars defects L4.    COVID-19 virus infection 02/06/2020   DDD (degenerative disc disease), lumbar    MRI 10/2015: grade I (7mm) anterolisthesis L4 on L5, w/ disc herniation with encroachment on spinal nerves at L4-S1 levels.    Depression    DVT, lower extremity, recurrent (HCC)    09/2022, bilat->eliquis  indef   Edema of both lower extremities due to peripheral venous insufficiency    +varicose veins (hx of vein stripping 2009)   GERD (gastroesophageal reflux disease)    worsened 07/2018 likely associated with lap band being too constricting->increased pantoprazole  to 40mg  bid at that time.   History of chronic bronchitis    hx of when she was a smoker: ? mild intermittent asthma?--much better since stopped smoking 2018.   History of fracture of phalanx of finger    left, 4th finger distal phalanx   History of non anemic vitamin B12 deficiency    s/p lap band surgery per pt report.   Hyperlipidemia    Hypertension    Iron  deficiency anemia 01/2019   hemoccults ordered but never turned in (01/2019-01/2020). Iron  supp started 02/2020. Hemoccults neg 07/2020, plan for iron  infusion. 08/2020 EGD with SB bx normal, no source of IDA seen on colonoscopy. Iron  infusion 09/06/2020   LAP-BAND surgery status    Pt getting lap band removed and is getting gastric sleave after.   Migraine syndrome    Ovarian cyst 10/2015   LEFT, 4 cm--noted on L spine MRI w/out contrast.  F/u u/s imaging showed simple cyst.   Right tibial fracture    Fragility fracture 2024/2025-->calcitonin + bisphosphonate per ortho   Spondylarthrosis  Tobacco dependence in remission    quit 2018    Assessment/Plan:   1 Day Post-Op Procedure(s) (LRB): ARTHROPLASTY, KNEE, TOTAL (Right) Principal Problem:   S/P total knee arthroplasty, right  Estimated body mass index is 42.51 kg/m as calculated from the following:   Height as of this encounter: 5' 3 (1.6 m).   Weight as of this encounter: 108.9 kg. Advance diet Up with therapy Pain well controlled VSS Acute post op blood loss anemia with underlying chronic anemia - Hgb 8.4. Patient unable to tolerate po Fe. Patient to schedule infusion next week. Baseline Hgb 9.8. CM to assist with discharge to  home with HHPT today pending safe completion of PT goals.  DVT Prophylaxis - TED hose and SCDS Eliquis  Weight-Bearing as tolerated to right leg   T. Medford Amber, PA-C [facility] 07/31/2023, 8:18 AM

## 2023-07-31 NOTE — TOC Initial Note (Signed)
 Transition of Care [facility]) - Initial/Assessment Note    Patient Details  Name: Samantha Doyle MRN: [id number] Date of Birth: 1975/09/20  [facility] CM/SW Contact:    Delphine KANDICE Bring, RN Phone Number: 07/31/2023, 12:48 PM  Clinical Narrative:                 Cm informed patient that MD has ordered DME. Patient is in agreement with DME. She did request a wide BSC. Patient has no preference which DME company to use.  CM spoke with Mitch at 10:09. CM gave him patient's information and her request.   @[id number]  CM received a message ffrom Mitch. He states that she declined BSC if she can't get a bariatric one through insurance. CM notified MD and nurse.   Expected Discharge Plan: Home/Self Care     Patient Goals and CMS Choice            Expected Discharge Plan and Services       Living arrangements for the past 2 months: Single Family Home Expected Discharge Date: 07/31/23                                    Prior Living Arrangements/Services Living arrangements for the past 2 months: Single Family Home Lives with:: Spouse, Minor Children Patient language and need for interpreter reviewed:: No (English)                 Activities of Daily Living   ADL Screening (condition at time of admission) Independently performs ADLs?: Yes (appropriate for developmental age) Is the patient deaf or have difficulty hearing?: No Does the patient have difficulty seeing, even when wearing glasses/contacts?: No Does the patient have difficulty concentrating, remembering, or making decisions?: No  Permission Sought/Granted                  Emotional Assessment Appearance:: Appears stated age   Affect (typically observed): Calm, Appropriate Orientation: : Oriented to Self, Oriented to Place, Oriented to  Time, Oriented to Situation      Admission diagnosis:  Primary osteoarthritis of right knee [M17.11] S/P total knee arthroplasty, right  [Z96.651] Patient Active Problem List   Diagnosis Date Noted   S/P total knee arthroplasty, right 07/30/2023   Iron  deficiency anemia 11/11/2021   Complication of surgical procedure 07/14/2019   Chronic pain syndrome 06/18/2018   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 06/18/2018   Lumbosacral spondylosis without myelopathy 06/18/2018   Depression    Spondylolisthesis at L4-L5 level 06/12/2016   Stiffness of finger joint 05/10/2015   Mallet deformity of fourth finger, left 02/22/2015   Migraine 02/16/2015   GERD (gastroesophageal reflux disease) 02/16/2015   Tobacco dependence 02/16/2015   Vitamin B 12 deficiency 02/16/2015   Lapband APS 2009 [place]  05/16/2012   Encounter for fitting or adjustment of gastric lap band 05/13/2012   Anxiety 02/08/2011   Essential (primary) hypertension 02/08/2011   PCP:  Candise Aleene DEL, MD Pharmacy:   [facility] [phone] - [place], [place] - [address] [place] [address] Phone: [phone] Fax: [phone]     Social Drivers of Health (SDOH) Social History: SDOH Screenings   Food Insecurity: No Food Insecurity (07/30/2023)  Housing: Low Risk  (07/30/2023)  Transportation Needs: No Transportation Needs (07/30/2023)  Utilities: Not At Risk (07/30/2023)  Depression (PHQ2-9): Low Risk  (05/27/2023)  Financial Resource  Strain: Low Risk  (07/24/2023)   [facility]  Social Connections: Unknown (05/19/2021)   Received from [facility]  Tobacco Use: Medium Risk (07/30/2023)   SDOH Interventions:     Readmission Risk Interventions     No data to display

## 2023-07-31 NOTE — Progress Notes (Signed)
 Patient is not able to walk the distance required to go the bathroom, or she is unable to safely negotiate stairs required to access the bathroom.  A 3in1 BSC will alleviate this problem.       Lollie Marrow, PA-C [facility]

## 2023-07-31 NOTE — Progress Notes (Signed)
 Physical Therapy Treatment Patient Details Name: Samantha Doyle MRN: [id number] DOB: 04-29-1975 Today's Date: 07/31/2023   History of Present Illness 48 y/o female s/p R TKA 7/24.    PT Comments  Pt was long sitting in bed upon arrival. She is A and O x 4. Endorses no pain at rest that elevated to 5/10 during wt bearing activity. She was able to safely exit bed, stand to RW, and tolerate ambulation >25ft. She also demonstrated safe performance of ascending/descending stairs to simulate home environment. Pt is cleared from an acute PT standpoint for safe DC home. She will continue to benefit from skilled PT at DC to maximize independence and safety with all ADLs.    If plan is discharge home, recommend the following: A little help with walking and/or transfers;A little help with bathing/dressing/bathroom;Assistance with cooking/housework;Assist for transportation;Help with stairs or ramp for entrance     Equipment Recommendations  Rolling walker (2 wheels);BSC/3in1 (pt would only like bariatric BSC. If unable to get bariatric BSC she does not want a regular sized. Pt's body habitus would benefit from the larger [facility].)       Precautions / Restrictions Precautions Precautions: Fall Recall of Precautions/Restrictions: Intact Restrictions Weight Bearing Restrictions Per Provider Order: Yes RLE Weight Bearing Per Provider Order: Weight bearing as tolerated     Mobility  Bed Mobility Overal bed mobility: Needs Assistance Bed Mobility: Supine to Sit  Supine to sit: Supervision   Transfers Overall transfer level: Needs assistance Equipment used: Rolling walker (2 wheels) Transfers: Sit to/from Stand Sit to Stand: Contact guard assist   Ambulation/Gait Ambulation/Gait assistance: Supervision Gait Distance (Feet): 200 Feet Assistive device: Rolling walker (2 wheels) Gait Pattern/deviations: Step-through pattern  General Gait Details: Pt was able to ambulate 200 ft with RW without  LOB   Stairs Stairs: Yes Stairs assistance: Supervision Stair Management: Step to pattern, One rail Right, Sideways Number of Stairs: 8 General stair comments: no difficulty or safety concern during stair training     Balance Overall balance assessment: Modified Independent      Communication Communication Communication: No apparent difficulties  Cognition Arousal: Alert Behavior During Therapy: WFL for tasks assessed/performed   PT - Cognitive impairments: No apparent impairments    PT - Cognition Comments: Pt is A and O x 4 Following commands: Intact      Cueing Cueing Techniques: Verbal cues  Exercises Total Joint Exercises Goniometric ROM: 2-92        Pertinent Vitals/Pain Pain Assessment Pain Assessment: 0-10 Pain Score: 5  Pain Location: R knee, reports pain in knee is much better than pre-surgery Pain Descriptors / Indicators: Aching, Discomfort Pain Intervention(s): Limited activity within patient's tolerance, Monitored during session, Premedicated before session, Repositioned, Ice applied     PT Goals (current goals can now be found in the care plan section) Acute Rehab PT Goals Patient Stated Goal: get back to work Progress towards PT goals: Progressing toward goals    Frequency    BID       AM-PAC PT 6 Clicks Mobility   Outcome Measure  Help needed turning from your back to your side while in a flat bed without using bedrails?: None Help needed moving from lying on your back to sitting on the side of a flat bed without using bedrails?: None Help needed moving to and from a bed to a chair (including a wheelchair)?: A Little Help needed standing up from a chair using your arms (e.g., wheelchair or bedside chair)?: A Little  Help needed to walk in hospital room?: A Little Help needed climbing 3-5 steps with a railing? : A Little 6 Click Score: 20    End of Session Equipment Utilized During Treatment: Gait belt Activity Tolerance: Patient  tolerated treatment well Patient left: in bed;with call bell/phone within reach;with bed alarm set Nurse Communication: Mobility status PT Visit Diagnosis: Muscle weakness (generalized) (M62.81);Difficulty in walking, not elsewhere classified (R26.2);Pain Pain - Right/Left: Right Pain - part of body: Knee     Time: 9075-9051 PT Time Calculation (min) (ACUTE ONLY): 24 min  Charges:    $Gait Training: 8-22 mins $Therapeutic Exercise: 8-22 mins PT General Charges $$ ACUTE PT VISIT: 1 Visit                    Rankin Essex PTA 07/31/23, 10:08 AM

## 2023-07-31 NOTE — Plan of Care (Signed)
 Patient ready for discharge home.  Discharge instructions printed and reviewed with patient and spouse.  Ice-man cooler/wrap and front wheel walker sent home with patient.

## 2023-08-05 NOTE — Anesthesia Postprocedure Evaluation (Signed)
 Anesthesia Post Note  Patient: Honore A Vankuren  Procedure(s) Performed: ARTHROPLASTY, KNEE, TOTAL (Right: Knee)  Patient location during evaluation: PACU Anesthesia Type: Spinal Level of consciousness: awake and alert Pain management: pain level controlled Vital Signs Assessment: post-procedure vital signs reviewed and stable Respiratory status: spontaneous breathing, nonlabored ventilation, respiratory function stable and patient connected to nasal cannula oxygen Cardiovascular status: blood pressure returned to baseline and stable Postop Assessment: no apparent nausea or vomiting Anesthetic complications: no   No notable events documented.   Last Vitals:  Vitals:   07/31/23 0743 07/31/23 1027  BP: 135/84 (!) 113/54  Pulse: 74   Resp: 14   Temp: 37.1 C   SpO2: 96%     Last Pain:  Vitals:   07/31/23 1045  TempSrc:   PainSc: 4                  Debby Mines

## 2023-08-24 DIAGNOSIS — I82409 Acute embolism and thrombosis of unspecified deep veins of unspecified lower extremity: Secondary | ICD-10-CM

## 2023-08-26 MED ORDER — METHYLPHENIDATE HCL ER (OSM) 36 MG PO TBCR: 36.0000 mg | EXTENDED_RELEASE_TABLET | Freq: Every day | ORAL | 0 refills | Status: DC

## 2023-08-26 NOTE — Telephone Encounter (Signed)
 Rx sent.

## 2023-08-27 ENCOUNTER — Other Ambulatory Visit (INDEPENDENT_AMBULATORY_CARE_PROVIDER_SITE_OTHER): Payer: Self-pay | Admitting: Vascular Surgery

## 2023-08-27 DIAGNOSIS — Z86718 Personal history of other venous thrombosis and embolism: Secondary | ICD-10-CM

## 2023-08-27 DIAGNOSIS — Z95828 Presence of other vascular implants and grafts: Secondary | ICD-10-CM

## 2023-08-27 DIAGNOSIS — I82409 Acute embolism and thrombosis of unspecified deep veins of unspecified lower extremity: Secondary | ICD-10-CM

## 2023-08-28 NOTE — Telephone Encounter (Signed)
 No further action needed at this time.

## 2023-09-03 ENCOUNTER — Other Ambulatory Visit: Payer: Self-pay | Admitting: Medical Genetics

## 2023-09-08 ENCOUNTER — Ambulatory Visit (INDEPENDENT_AMBULATORY_CARE_PROVIDER_SITE_OTHER)

## 2023-09-08 ENCOUNTER — Encounter (INDEPENDENT_AMBULATORY_CARE_PROVIDER_SITE_OTHER): Payer: Self-pay | Admitting: Nurse Practitioner

## 2023-09-08 ENCOUNTER — Ambulatory Visit (INDEPENDENT_AMBULATORY_CARE_PROVIDER_SITE_OTHER): Admitting: Nurse Practitioner

## 2023-09-08 VITALS — BP 113/78 | HR 73 | Ht 63.0 in | Wt 236.0 lb

## 2023-09-08 DIAGNOSIS — Z86718 Personal history of other venous thrombosis and embolism: Secondary | ICD-10-CM

## 2023-09-08 DIAGNOSIS — Z95828 Presence of other vascular implants and grafts: Secondary | ICD-10-CM

## 2023-09-08 DIAGNOSIS — I10 Essential (primary) hypertension: Secondary | ICD-10-CM | POA: Diagnosis not present

## 2023-09-08 NOTE — Progress Notes (Signed)
 Subjective:    Patient ID: Samantha Doyle, female    DOB: 08/12/1975, 48 y.o.   MRN: [id number] Chief Complaint  Patient presents with   Follow-up    Follow up, discuss having port taken out     The patient presents to the office for follow-up status post IVC filter placement prior to joint replacement surgery.  The patient has a history of DVT and therefore was at high risk for recurrence in the perioperative timeframe after undergoing a total joint replacement.    Right TKR surgery was on 07/30/2023.  The patient has been doing well with rehab and their mobility is increasing.  The patient notes the affected leg is not increasingly swollen or painful.  No signs and symptoms consistent with recurrence of DVT in the lower extremity.  Patient does note that as a baseline the affected extremity tends to swell with prolonged dependence, symptoms are much better with elevation.  The patient notes minimal edema in the morning which steadily worsens throughout the day.  The patient has not been using compression therapy at this point.  The patient is not on  anticoagulation.  No SOB or pleuritic chest pains.  No cough or hemoptysis.  No blood per rectum or blood in any sputum.  No excessive bruising per the patient.   No recent shortening of the patient's walking distance or new symptoms consistent with claudication.  No history of rest pain symptoms. No new ulcers or wounds of the lower extremities have occurred.  The patient denies amaurosis fugax or recent TIA symptoms. There are no recent neurological changes noted. No recent episodes of angina or shortness of breath documented.   Today noninvasive studies show no evidence of DVT bilaterally    Review of Systems  All other systems reviewed and are negative.      Objective:   Physical Exam Vitals reviewed.  HENT:     Head: Normocephalic.  Cardiovascular:     Rate and Rhythm: Normal rate.     Pulses: Normal pulses.   Pulmonary:     Effort: Pulmonary effort is normal.  Skin:    General: Skin is warm and dry.  Neurological:     Mental Status: She is alert and oriented to person, place, and time.  Psychiatric:        Behavior: Behavior normal.        Thought Content: Thought content normal.        Judgment: Judgment normal.     BP 113/78   Pulse 73   Ht 5' 3 (1.6 m)   Wt 236 lb (107 kg)   BMI 41.81 kg/m   Past Medical History:  Diagnosis Date   Allergic rhinitis    Bilateral primary osteoarthritis of knee    severe   Carpal tunnel syndrome    right wrist, numb all the time   Cervical dysplasia    Chronic back pain    Degenerative lumbar spondylosis with grade 2 spondylolisthesis L4 on L5 with bilater pars defects L4.    COVID-19 virus infection 02/06/2020   DDD (degenerative disc disease), lumbar    MRI 10/2015: grade I (7mm) anterolisthesis L4 on L5, w/ disc herniation with encroachment on spinal nerves at L4-S1 levels.   Depression    DVT, lower extremity, recurrent (HCC)    09/2022, bilat->eliquis  indef   Edema of both lower extremities due to peripheral venous insufficiency    +varicose veins (hx of vein stripping 2009)   GERD (gastroesophageal  reflux disease)    worsened 07/2018 likely associated with lap band being too constricting->increased pantoprazole  to 40mg  bid at that time.   History of chronic bronchitis    hx of when she was a smoker: ? mild intermittent asthma?--much better since stopped smoking 2018.   History of fracture of phalanx of finger    left, 4th finger distal phalanx   History of non anemic vitamin B12 deficiency    s/p lap band surgery per pt report.   Hyperlipidemia    Hypertension    Iron  deficiency anemia 01/2019   hemoccults ordered but never turned in (01/2019-01/2020). Iron  supp started 02/2020. Hemoccults neg 07/2020, plan for iron  infusion. 08/2020 EGD with SB bx normal, no source of IDA seen on colonoscopy. Iron  infusion 09/06/2020   LAP-BAND surgery  status    Pt getting lap band removed and is getting gastric sleave after.   Migraine syndrome    Ovarian cyst 10/2015   LEFT, 4 cm--noted on L spine MRI w/out contrast.  F/u u/s imaging showed simple cyst.   Right tibial fracture    Fragility fracture 2024/2025-->calcitonin + bisphosphonate per ortho   Spondylarthrosis    Tobacco dependence in remission    quit 2018    Social History   Socioeconomic History   Marital status: Married    Spouse name: Not on file   Number of children: Not on file   Years of education: Not on file   Highest education level: Not on file  Occupational History   Not on file  Tobacco Use   Smoking status: Former    Average packs/day: 0.3 packs/day for 5.0 years (1.3 ttl pk-yrs)    Types: Cigarettes    Start date: 2016   Smokeless tobacco: Never  Vaping Use   Vaping status: Never Used  Substance and Sexual Activity   Alcohol use: No   Drug use: No   Sexual activity: Yes    Birth control/protection: None  Other Topics Concern   Not on file  Social History Narrative   Married, no children.   Occup: Retail banker   Tobacco: 6 pack-yr hx--quit with wellbutrin .   No alcohol or drugs.   Social Drivers of Corporate investment banker Strain: Low Risk  (08/18/2023)   Received from [facility]   Overall Financial Resource Strain (CARDIA)    Difficulty of Paying Living Expenses: Not hard at all  Food Insecurity: No Food Insecurity (08/18/2023)   Received from [facility]   Hunger Vital Sign    Within the past 12 months, you worried that your food would run out before you got the money to buy more.: Never true    Within the past 12 months, the food you bought just didn't last and you didn't have money to get more.: Never true  Transportation Needs: No Transportation Needs (08/18/2023)   Received from [facility] - Transportation    In the past 12 months, has lack of transportation  kept you from medical appointments or from getting medications?: No    Lack of Transportation (Non-Medical): No  Physical Activity: Not on file  Stress: Not on file  Social Connections: Unknown (05/19/2021)   Received from [facility]   Social Network    Social Network: Not on file  Intimate Partner Violence: Not At Risk (07/30/2023)   Humiliation, Afraid, Rape, and Kick questionnaire    Fear of Current or Ex-Partner: No  Emotionally Abused: No    Physically Abused: No    Sexually Abused: No    Past Surgical History:  Procedure Laterality Date   BACK SURGERY  2019   CARPAL TUNNEL RELEASE Left    CERVICAL CONE BIOPSY     COLONOSCOPY     2015 normal.  08/2020 (for IDA)->incomplete prep, no adenomas->rpt 5 yrs d/t incomplete prep.   ESOPHAGOGASTRODUODENOSCOPY N/A 04/27/2014   Procedure: ESOPHAGOGASTRODUODENOSCOPY (EGD);  Surgeon: Alm Angle, MD;  Location: [facility];  Service: General;  Laterality: N/A;   ESOPHAGOGASTRODUODENOSCOPY  08/29/2020   6 cm hiatal hernia noted, o/w normal, SB bx neg.  ?hernia contributing to pt's IDA?   GASTRIC BANDING PORT REVISION N/A 10/02/2014   Procedure: LAPROSCOPIC PLACEMENT OF LAP BAND PORT;  Surgeon: Morene Olives, MD;  Location: [facility];  Service: General;  Laterality: N/A;   IVC FILTER INSERTION N/A 07/27/2023   Procedure: IVC FILTER INSERTION;  Surgeon: Marea Selinda RAMAN, MD;  Location: [facility];  Service: Cardiovascular;  Laterality: N/A;   LAPAROSCOPIC GASTRIC BANDING  03/2007   [facility] - [facility]; Dr Velinda Hipp   LAPAROSCOPIC REVISION OF GASTRIC BAND N/A 05/17/2012   Procedure: LAPAROSCOPIC REVISION OF SLIPPED GASTRIC BAND;  Surgeon: Morene ONEIDA Olives, MD;  Location: [facility];  Service: General;  Laterality: N/A;   LAPAROSCOPY N/A 04/30/2014   Procedure: DIAGNOSTIC LAPAROSCOPY WITH REMOVAL OF INFECTED LAP BAND PORT WITH DEBRIDEMENT SUBCUTANEOUS ABSCESS;  Surgeon: Camellia Blush, MD;  Location: [facility];  Service:  General;  Laterality: N/A;   TOTAL KNEE ARTHROPLASTY Right 07/30/2023   Procedure: ARTHROPLASTY, KNEE, TOTAL;  Surgeon: Lorelle Hussar, MD;  Location: [facility];  Service: Orthopedics;  Laterality: Right;   VARICOSE VEIN SURGERY Bilateral 2009    Family History  Problem Relation Age of Onset   Alcohol abuse Mother    Drug abuse Mother    Arthritis Mother    Hyperlipidemia Mother    Heart disease Mother    Hypertension Mother    Non-Hodgkin's lymphoma Mother    Lung cancer Mother    Alcohol abuse Father    Arthritis Father    Hyperlipidemia Father    Heart disease Father    Stroke Father    Hypertension Father    Mental illness Father    Diabetes Father    Hyperlipidemia Sister    Hypertension Sister    Arthritis Maternal Grandmother    Cancer Maternal Grandmother    Hypertension Maternal Grandmother    Arthritis Maternal Grandfather    Arthritis Paternal Grandmother    Cancer Paternal Grandmother    Hyperlipidemia Paternal Grandmother    Heart disease Paternal Grandmother    Hypertension Paternal Grandmother    Diabetes Paternal Grandmother    Arthritis Paternal Grandfather    Hyperlipidemia Paternal Grandfather    Heart disease Paternal Grandfather    Stroke Paternal Grandfather    Hypertension Paternal Grandfather     Allergies  Allergen Reactions   Meperidine Hives and Nausea And Vomiting   Vancomycin  Anaphylaxis and Itching    Face/back warm and red.  Pt lips and tongue swelled, hives   Demerol Hives and Nausea And Vomiting   Microplegia Msa-Msg  [Cardioplegia Del Nido Formula] Other (See Comments)    Other reaction(s): headache   Monosodium Glutamate Nausea And Vomiting and Other (See Comments)    migraines    Tape Rash    Plastic tape       Latest Ref Rng & Units 07/31/2023  5:38 AM 07/24/2023   10:40 AM 05/27/2023    3:01 PM  CBC  WBC 4.0 - 10.5 K/uL 10.3  5.3  7.1   Hemoglobin 12.0 - 15.0 g/dL 8.4  9.8  89.6   Hematocrit 36.0 - 46.0 % 27.7   33.0  33.7   Platelets 150 - 400 K/uL 216  243  281.0       CMP     Component Value Date/Time   NA 138 07/31/2023 0538   NA 140 08/19/2020 0000   K 3.6 07/31/2023 0538   CL 103 07/31/2023 0538   CO2 27 07/31/2023 0538   GLUCOSE 108 (H) 07/31/2023 0538   BUN 10 07/31/2023 0538   BUN 13 08/19/2020 0000   CREATININE 0.56 07/31/2023 0538   CREATININE 0.71 10/22/2022 1403   CREATININE 0.61 08/07/2017 1508   CALCIUM 8.3 (L) 07/31/2023 0538   PROT 6.8 07/24/2023 1040   ALBUMIN 3.7 07/24/2023 1040   AST 22 07/24/2023 1040   AST 14 (L) 10/22/2022 1403   ALT 17 07/24/2023 1040   ALT 12 10/22/2022 1403   ALKPHOS 50 07/24/2023 1040   BILITOT 0.6 07/24/2023 1040   BILITOT 0.3 10/22/2022 1403   GFR 106.35 05/27/2023 1501   GFRNONAA >60 07/31/2023 0538   GFRNONAA >60 10/22/2022 1403     No results found.     Assessment & Plan:   1. History of DVT (deep vein thrombosis) (Primary) The patient has done well status post joint replacement surgery.  Therefore, I recommend that we remove the IVC filter.  Risk and benefits were reviewed all questions answered patient has agreed to proceed.  Patient will continue to elevate to minimize swelling.  Given the history of DVT and the possibility of post phlebitic changes such as swelling and discomfort the patient should wear graduated compression stockings.  The compression should be worn on a daily basis.  In addition, behavioral modification including elevation during the day and avoidance of prolonged dependency is helpful.    The patient will follow-up with me after the filter removal.  2. Essential (primary) hypertension Continue antihypertensive medications as already ordered, these medications have been reviewed and there are no changes at this time.   Current Outpatient Medications on File Prior to Visit  Medication Sig Dispense Refill   acetaminophen  (TYLENOL ) 500 MG tablet Take 2 tablets (1,000 mg total) by mouth every 8  (eight) hours. 30 tablet 0   albuterol  (VENTOLIN  HFA) 108 (90 Base) MCG/ACT inhaler INHALE 2 PUFFS INTO THE LUNGS EVERY 4 (FOUR) HOURS AS NEEDED FOR WHEEZING OR SHORTNESS OF BREATH. 8 g 0   baclofen (LIORESAL) 20 MG tablet Take 10-20 mg by mouth 4 (four) times daily as needed for muscle spasms.     cetirizine (ZYRTEC) 10 MG tablet Take 20 mg by mouth daily.     docusate sodium  (COLACE) 100 MG capsule Take 1 capsule (100 mg total) by mouth 2 (two) times daily. 10 capsule 0   DULoxetine  (CYMBALTA ) 60 MG capsule Take 1 capsule (60 mg total) by mouth daily. 90 capsule 3   gabapentin  (NEURONTIN ) 800 MG tablet Take 800 mg by mouth in the morning, at noon, in the evening, and at bedtime.  6   lisinopril  (ZESTRIL ) 40 MG tablet Take 1 tablet (40 mg total) by mouth daily. 90 tablet 3   methylphenidate  (CONCERTA ) 36 MG PO CR tablet Take 1 tablet (36 mg total) by mouth daily. 30 tablet 0   Multiple Vitamin (MULITIVITAMIN WITH  MINERALS) TABS Take 1 tablet by mouth daily.     NARCAN 4 MG/0.1ML LIQD nasal spray kit Place 0.4 mg into the nose once.     oxyCODONE  (OXY IR/ROXICODONE ) 5 MG immediate release tablet Take 1 tablet (5 mg total) by mouth every 4 (four) hours as needed for severe pain (pain score 7-10) or breakthrough pain. 30 tablet 0   Oxycodone  HCl 10 MG TABS Take 10 mg by mouth every 6 (six) hours. Do not fill until 05/26/2022 for 30 days.     pantoprazole  (PROTONIX ) 40 MG tablet Take 1 tablet (40 mg total) by mouth 2 (two) times daily. 180 tablet 3   rizatriptan  (MAXALT -MLT) 10 MG disintegrating tablet TAKE 1 TABLET BY MOUTH AS NEEDED FOR MIGRAINE. MAY REPEAT IN 2 HOURS IF NEEDED 10 tablet 0   apixaban  (ELIQUIS ) 2.5 MG TABS tablet Take 1 tablet (2.5 mg total) by mouth 2 (two) times daily for 28 days. (Patient not taking: Reported on 09/08/2023) 56 tablet 0   chlorhexidine  (HIBICLENS ) 4 % external liquid Apply 15 mLs (1 Application total) topically as directed for 30 doses. Use as directed daily for 5 days  every other week for 6 weeks. (Patient not taking: Reported on 09/08/2023) 946 mL 1   ondansetron  (ZOFRAN ) 4 MG tablet Take 1 tablet (4 mg total) by mouth every 6 (six) hours as needed for nausea. (Patient not taking: Reported on 09/08/2023) 20 tablet 0   No current facility-administered medications on file prior to visit.    There are no Patient Instructions on file for this visit. No follow-ups on file.   Breelyn Icard E Jamez Ambrocio, NP

## 2023-09-08 NOTE — H&P (View-Only) (Signed)
 Subjective:    Patient ID: Samantha Doyle, female    DOB: 08/12/1975, 48 y.o.   MRN: [id number] Chief Complaint  Patient presents with   Follow-up    Follow up, discuss having port taken out     The patient presents to the office for follow-up status post IVC filter placement prior to joint replacement surgery.  The patient has a history of DVT and therefore was at high risk for recurrence in the perioperative timeframe after undergoing a total joint replacement.    Right TKR surgery was on 07/30/2023.  The patient has been doing well with rehab and their mobility is increasing.  The patient notes the affected leg is not increasingly swollen or painful.  No signs and symptoms consistent with recurrence of DVT in the lower extremity.  Patient does note that as a baseline the affected extremity tends to swell with prolonged dependence, symptoms are much better with elevation.  The patient notes minimal edema in the morning which steadily worsens throughout the day.  The patient has not been using compression therapy at this point.  The patient is not on  anticoagulation.  No SOB or pleuritic chest pains.  No cough or hemoptysis.  No blood per rectum or blood in any sputum.  No excessive bruising per the patient.   No recent shortening of the patient's walking distance or new symptoms consistent with claudication.  No history of rest pain symptoms. No new ulcers or wounds of the lower extremities have occurred.  The patient denies amaurosis fugax or recent TIA symptoms. There are no recent neurological changes noted. No recent episodes of angina or shortness of breath documented.   Today noninvasive studies show no evidence of DVT bilaterally    Review of Systems  All other systems reviewed and are negative.      Objective:   Physical Exam Vitals reviewed.  HENT:     Head: Normocephalic.  Cardiovascular:     Rate and Rhythm: Normal rate.     Pulses: Normal pulses.   Pulmonary:     Effort: Pulmonary effort is normal.  Skin:    General: Skin is warm and dry.  Neurological:     Mental Status: She is alert and oriented to person, place, and time.  Psychiatric:        Behavior: Behavior normal.        Thought Content: Thought content normal.        Judgment: Judgment normal.     BP 113/78   Pulse 73   Ht 5' 3 (1.6 m)   Wt 236 lb (107 kg)   BMI 41.81 kg/m   Past Medical History:  Diagnosis Date   Allergic rhinitis    Bilateral primary osteoarthritis of knee    severe   Carpal tunnel syndrome    right wrist, numb all the time   Cervical dysplasia    Chronic back pain    Degenerative lumbar spondylosis with grade 2 spondylolisthesis L4 on L5 with bilater pars defects L4.    COVID-19 virus infection 02/06/2020   DDD (degenerative disc disease), lumbar    MRI 10/2015: grade I (7mm) anterolisthesis L4 on L5, w/ disc herniation with encroachment on spinal nerves at L4-S1 levels.   Depression    DVT, lower extremity, recurrent (HCC)    09/2022, bilat->eliquis  indef   Edema of both lower extremities due to peripheral venous insufficiency    +varicose veins (hx of vein stripping 2009)   GERD (gastroesophageal  reflux disease)    worsened 07/2018 likely associated with lap band being too constricting->increased pantoprazole  to 40mg  bid at that time.   History of chronic bronchitis    hx of when she was a smoker: ? mild intermittent asthma?--much better since stopped smoking 2018.   History of fracture of phalanx of finger    left, 4th finger distal phalanx   History of non anemic vitamin B12 deficiency    s/p lap band surgery per pt report.   Hyperlipidemia    Hypertension    Iron  deficiency anemia 01/2019   hemoccults ordered but never turned in (01/2019-01/2020). Iron  supp started 02/2020. Hemoccults neg 07/2020, plan for iron  infusion. 08/2020 EGD with SB bx normal, no source of IDA seen on colonoscopy. Iron  infusion 09/06/2020   LAP-BAND surgery  status    Pt getting lap band removed and is getting gastric sleave after.   Migraine syndrome    Ovarian cyst 10/2015   LEFT, 4 cm--noted on L spine MRI w/out contrast.  F/u u/s imaging showed simple cyst.   Right tibial fracture    Fragility fracture 2024/2025-->calcitonin + bisphosphonate per ortho   Spondylarthrosis    Tobacco dependence in remission    quit 2018    Social History   Socioeconomic History   Marital status: Married    Spouse name: Not on file   Number of children: Not on file   Years of education: Not on file   Highest education level: Not on file  Occupational History   Not on file  Tobacco Use   Smoking status: Former    Average packs/day: 0.3 packs/day for 5.0 years (1.3 ttl pk-yrs)    Types: Cigarettes    Start date: 2016   Smokeless tobacco: Never  Vaping Use   Vaping status: Never Used  Substance and Sexual Activity   Alcohol use: No   Drug use: No   Sexual activity: Yes    Birth control/protection: None  Other Topics Concern   Not on file  Social History Narrative   Married, no children.   Occup: Retail banker   Tobacco: 6 pack-yr hx--quit with wellbutrin .   No alcohol or drugs.   Social Drivers of Corporate investment banker Strain: Low Risk  (08/18/2023)   Received from [facility]   Overall Financial Resource Strain (CARDIA)    Difficulty of Paying Living Expenses: Not hard at all  Food Insecurity: No Food Insecurity (08/18/2023)   Received from [facility]   Hunger Vital Sign    Within the past 12 months, you worried that your food would run out before you got the money to buy more.: Never true    Within the past 12 months, the food you bought just didn't last and you didn't have money to get more.: Never true  Transportation Needs: No Transportation Needs (08/18/2023)   Received from [facility] - Transportation    In the past 12 months, has lack of transportation  kept you from medical appointments or from getting medications?: No    Lack of Transportation (Non-Medical): No  Physical Activity: Not on file  Stress: Not on file  Social Connections: Unknown (05/19/2021)   Received from [facility]   Social Network    Social Network: Not on file  Intimate Partner Violence: Not At Risk (07/30/2023)   Humiliation, Afraid, Rape, and Kick questionnaire    Fear of Current or Ex-Partner: No  Emotionally Abused: No    Physically Abused: No    Sexually Abused: No    Past Surgical History:  Procedure Laterality Date   BACK SURGERY  2019   CARPAL TUNNEL RELEASE Left    CERVICAL CONE BIOPSY     COLONOSCOPY     2015 normal.  08/2020 (for IDA)->incomplete prep, no adenomas->rpt 5 yrs d/t incomplete prep.   ESOPHAGOGASTRODUODENOSCOPY N/A 04/27/2014   Procedure: ESOPHAGOGASTRODUODENOSCOPY (EGD);  Surgeon: Alm Angle, MD;  Location: [facility];  Service: General;  Laterality: N/A;   ESOPHAGOGASTRODUODENOSCOPY  08/29/2020   6 cm hiatal hernia noted, o/w normal, SB bx neg.  ?hernia contributing to pt's IDA?   GASTRIC BANDING PORT REVISION N/A 10/02/2014   Procedure: LAPROSCOPIC PLACEMENT OF LAP BAND PORT;  Surgeon: Morene Olives, MD;  Location: [facility];  Service: General;  Laterality: N/A;   IVC FILTER INSERTION N/A 07/27/2023   Procedure: IVC FILTER INSERTION;  Surgeon: Marea Selinda RAMAN, MD;  Location: [facility];  Service: Cardiovascular;  Laterality: N/A;   LAPAROSCOPIC GASTRIC BANDING  03/2007   [facility]; Dr Velinda Hipp   LAPAROSCOPIC REVISION OF GASTRIC BAND N/A 05/17/2012   Procedure: LAPAROSCOPIC REVISION OF SLIPPED GASTRIC BAND;  Surgeon: Morene ONEIDA Olives, MD;  Location: [facility];  Service: General;  Laterality: N/A;   LAPAROSCOPY N/A 04/30/2014   Procedure: DIAGNOSTIC LAPAROSCOPY WITH REMOVAL OF INFECTED LAP BAND PORT WITH DEBRIDEMENT SUBCUTANEOUS ABSCESS;  Surgeon: Camellia Blush, MD;  Location: [facility];  Service:  General;  Laterality: N/A;   TOTAL KNEE ARTHROPLASTY Right 07/30/2023   Procedure: ARTHROPLASTY, KNEE, TOTAL;  Surgeon: Lorelle Hussar, MD;  Location: [facility];  Service: Orthopedics;  Laterality: Right;   VARICOSE VEIN SURGERY Bilateral 2009    Family History  Problem Relation Age of Onset   Alcohol abuse Mother    Drug abuse Mother    Arthritis Mother    Hyperlipidemia Mother    Heart disease Mother    Hypertension Mother    Non-Hodgkin's lymphoma Mother    Lung cancer Mother    Alcohol abuse Father    Arthritis Father    Hyperlipidemia Father    Heart disease Father    Stroke Father    Hypertension Father    Mental illness Father    Diabetes Father    Hyperlipidemia Sister    Hypertension Sister    Arthritis Maternal Grandmother    Cancer Maternal Grandmother    Hypertension Maternal Grandmother    Arthritis Maternal Grandfather    Arthritis Paternal Grandmother    Cancer Paternal Grandmother    Hyperlipidemia Paternal Grandmother    Heart disease Paternal Grandmother    Hypertension Paternal Grandmother    Diabetes Paternal Grandmother    Arthritis Paternal Grandfather    Hyperlipidemia Paternal Grandfather    Heart disease Paternal Grandfather    Stroke Paternal Grandfather    Hypertension Paternal Grandfather     Allergies  Allergen Reactions   Meperidine Hives and Nausea And Vomiting   Vancomycin  Anaphylaxis and Itching    Face/back warm and red.  Pt lips and tongue swelled, hives   Demerol Hives and Nausea And Vomiting   Microplegia Msa-Msg  [Cardioplegia Del Nido Formula] Other (See Comments)    Other reaction(s): headache   Monosodium Glutamate Nausea And Vomiting and Other (See Comments)    migraines    Tape Rash    Plastic tape       Latest Ref Rng & Units 07/31/2023  5:38 AM 07/24/2023   10:40 AM 05/27/2023    3:01 PM  CBC  WBC 4.0 - 10.5 K/uL 10.3  5.3  7.1   Hemoglobin 12.0 - 15.0 g/dL 8.4  9.8  89.6   Hematocrit 36.0 - 46.0 % 27.7   33.0  33.7   Platelets 150 - 400 K/uL 216  243  281.0       CMP     Component Value Date/Time   NA 138 07/31/2023 0538   NA 140 08/19/2020 0000   K 3.6 07/31/2023 0538   CL 103 07/31/2023 0538   CO2 27 07/31/2023 0538   GLUCOSE 108 (H) 07/31/2023 0538   BUN 10 07/31/2023 0538   BUN 13 08/19/2020 0000   CREATININE 0.56 07/31/2023 0538   CREATININE 0.71 10/22/2022 1403   CREATININE 0.61 08/07/2017 1508   CALCIUM 8.3 (L) 07/31/2023 0538   PROT 6.8 07/24/2023 1040   ALBUMIN 3.7 07/24/2023 1040   AST 22 07/24/2023 1040   AST 14 (L) 10/22/2022 1403   ALT 17 07/24/2023 1040   ALT 12 10/22/2022 1403   ALKPHOS 50 07/24/2023 1040   BILITOT 0.6 07/24/2023 1040   BILITOT 0.3 10/22/2022 1403   GFR 106.35 05/27/2023 1501   GFRNONAA >60 07/31/2023 0538   GFRNONAA >60 10/22/2022 1403     No results found.     Assessment & Plan:   1. History of DVT (deep vein thrombosis) (Primary) The patient has done well status post joint replacement surgery.  Therefore, I recommend that we remove the IVC filter.  Risk and benefits were reviewed all questions answered patient has agreed to proceed.  Patient will continue to elevate to minimize swelling.  Given the history of DVT and the possibility of post phlebitic changes such as swelling and discomfort the patient should wear graduated compression stockings.  The compression should be worn on a daily basis.  In addition, behavioral modification including elevation during the day and avoidance of prolonged dependency is helpful.    The patient will follow-up with me after the filter removal.  2. Essential (primary) hypertension Continue antihypertensive medications as already ordered, these medications have been reviewed and there are no changes at this time.   Current Outpatient Medications on File Prior to Visit  Medication Sig Dispense Refill   acetaminophen  (TYLENOL ) 500 MG tablet Take 2 tablets (1,000 mg total) by mouth every 8  (eight) hours. 30 tablet 0   albuterol  (VENTOLIN  HFA) 108 (90 Base) MCG/ACT inhaler INHALE 2 PUFFS INTO THE LUNGS EVERY 4 (FOUR) HOURS AS NEEDED FOR WHEEZING OR SHORTNESS OF BREATH. 8 g 0   baclofen (LIORESAL) 20 MG tablet Take 10-20 mg by mouth 4 (four) times daily as needed for muscle spasms.     cetirizine (ZYRTEC) 10 MG tablet Take 20 mg by mouth daily.     docusate sodium  (COLACE) 100 MG capsule Take 1 capsule (100 mg total) by mouth 2 (two) times daily. 10 capsule 0   DULoxetine  (CYMBALTA ) 60 MG capsule Take 1 capsule (60 mg total) by mouth daily. 90 capsule 3   gabapentin  (NEURONTIN ) 800 MG tablet Take 800 mg by mouth in the morning, at noon, in the evening, and at bedtime.  6   lisinopril  (ZESTRIL ) 40 MG tablet Take 1 tablet (40 mg total) by mouth daily. 90 tablet 3   methylphenidate  (CONCERTA ) 36 MG PO CR tablet Take 1 tablet (36 mg total) by mouth daily. 30 tablet 0   Multiple Vitamin (MULITIVITAMIN WITH  MINERALS) TABS Take 1 tablet by mouth daily.     NARCAN 4 MG/0.1ML LIQD nasal spray kit Place 0.4 mg into the nose once.     oxyCODONE  (OXY IR/ROXICODONE ) 5 MG immediate release tablet Take 1 tablet (5 mg total) by mouth every 4 (four) hours as needed for severe pain (pain score 7-10) or breakthrough pain. 30 tablet 0   Oxycodone  HCl 10 MG TABS Take 10 mg by mouth every 6 (six) hours. Do not fill until 05/26/2022 for 30 days.     pantoprazole  (PROTONIX ) 40 MG tablet Take 1 tablet (40 mg total) by mouth 2 (two) times daily. 180 tablet 3   rizatriptan  (MAXALT -MLT) 10 MG disintegrating tablet TAKE 1 TABLET BY MOUTH AS NEEDED FOR MIGRAINE. MAY REPEAT IN 2 HOURS IF NEEDED 10 tablet 0   apixaban  (ELIQUIS ) 2.5 MG TABS tablet Take 1 tablet (2.5 mg total) by mouth 2 (two) times daily for 28 days. (Patient not taking: Reported on 09/08/2023) 56 tablet 0   chlorhexidine  (HIBICLENS ) 4 % external liquid Apply 15 mLs (1 Application total) topically as directed for 30 doses. Use as directed daily for 5 days  every other week for 6 weeks. (Patient not taking: Reported on 09/08/2023) 946 mL 1   ondansetron  (ZOFRAN ) 4 MG tablet Take 1 tablet (4 mg total) by mouth every 6 (six) hours as needed for nausea. (Patient not taking: Reported on 09/08/2023) 20 tablet 0   No current facility-administered medications on file prior to visit.    There are no Patient Instructions on file for this visit. No follow-ups on file.   Breelyn Icard E Jamez Ambrocio, NP

## 2023-09-09 ENCOUNTER — Ambulatory Visit: Admitting: Family Medicine

## 2023-09-09 ENCOUNTER — Encounter: Payer: Self-pay | Admitting: Family Medicine

## 2023-09-09 VITALS — BP 125/73 | HR 82 | Temp 97.8°F | Ht 63.0 in | Wt 237.4 lb

## 2023-09-09 DIAGNOSIS — D508 Other iron deficiency anemias: Secondary | ICD-10-CM | POA: Diagnosis not present

## 2023-09-09 DIAGNOSIS — Z0000 Encounter for general adult medical examination without abnormal findings: Secondary | ICD-10-CM

## 2023-09-09 DIAGNOSIS — F339 Major depressive disorder, recurrent, unspecified: Secondary | ICD-10-CM | POA: Diagnosis not present

## 2023-09-09 DIAGNOSIS — F988 Other specified behavioral and emotional disorders with onset usually occurring in childhood and adolescence: Secondary | ICD-10-CM | POA: Diagnosis not present

## 2023-09-09 DIAGNOSIS — F411 Generalized anxiety disorder: Secondary | ICD-10-CM

## 2023-09-09 DIAGNOSIS — I10 Essential (primary) hypertension: Secondary | ICD-10-CM

## 2023-09-09 DIAGNOSIS — Z86718 Personal history of other venous thrombosis and embolism: Secondary | ICD-10-CM

## 2023-09-09 DIAGNOSIS — Z23 Encounter for immunization: Secondary | ICD-10-CM | POA: Diagnosis not present

## 2023-09-09 DIAGNOSIS — Z1231 Encounter for screening mammogram for malignant neoplasm of breast: Secondary | ICD-10-CM | POA: Diagnosis not present

## 2023-09-09 MED ORDER — ALBUTEROL SULFATE HFA 108 (90 BASE) MCG/ACT IN AERS: 2.0000 | INHALATION_SPRAY | RESPIRATORY_TRACT | 1 refills | Status: AC | PRN

## 2023-09-09 NOTE — Progress Notes (Signed)
 OFFICE VISIT  09/09/2023  CC:  Chief Complaint  Patient presents with   Medical Management of Chronic Issues    Patient is a 48 y.o. female who presents for annual health maintenance exam and 33-month follow-up mood disorder and adult ADD as well as follow-up hypertension and history of recurrent DVT.  INTERIM HX: Samantha Doyle is doing great. She underwent right total knee arthroplasty on 07/30/2023. She had an IVC filter placed on 07/27/2023. Preop hemoglobin was 9.8.  Postop hemoglobin 8.4. She is very happy with her results and is participating and making progress in PT.  She has been seen by the vascular surgery team at [facility]. Venous ultrasound was done yesterday and showed no clots.  She has been off of Eliquis  for several weeks now. She will be getting her IVC filter out soon.  She denies any acute lower extremity swelling or pain. She does wear compression stockings.  She never got iron  infusions that we had planned back in May of this year--> there was a miscommunication with scheduling. She does crave ice still.   ADD:  Pt states all is going well with the med at current dosing (methylphenidate  ER 36 mg a day): much improved focus, concentration, task completion.  Less frustration, better multitasking, less impulsivity and restlessness.  Mood is stable. No side effects from the medication.  Anxiety levels and mood are good. She plans on returning to work in November this year.  PMP AWARE reviewed today: most recent rx for methylphenidate  ER 36 mg was filled 08/26/2023, # 30, rx by me. No red flags.  Past Medical History:  Diagnosis Date   Allergic rhinitis    Bilateral primary osteoarthritis of knee    severe   Carpal tunnel syndrome    right wrist, numb all the time   Cervical dysplasia    Chronic back pain    Degenerative lumbar spondylosis with grade 2 spondylolisthesis L4 on L5 with bilater pars defects L4.    COVID-19 virus infection 02/06/2020   DDD  (degenerative disc disease), lumbar    MRI 10/2015: grade I (7mm) anterolisthesis L4 on L5, w/ disc herniation with encroachment on spinal nerves at L4-S1 levels.   Depression    DVT, lower extremity, recurrent (HCC)    09/2022, bilat->eliquis  indef   Edema of both lower extremities due to peripheral venous insufficiency    +varicose veins (hx of vein stripping 2009)   GERD (gastroesophageal reflux disease)    worsened 07/2018 likely associated with lap band being too constricting->increased pantoprazole  to 40mg  bid at that time.   History of chronic bronchitis    hx of when she was a smoker: ? mild intermittent asthma?--much better since stopped smoking 2018.   History of fracture of phalanx of finger    left, 4th finger distal phalanx   History of non anemic vitamin B12 deficiency    s/p lap band surgery per pt report.   Hyperlipidemia    Hypertension    Iron  deficiency anemia 01/2019   hemoccults ordered but never turned in (01/2019-01/2020). Iron  supp started 02/2020. Hemoccults neg 07/2020, plan for iron  infusion. 08/2020 EGD with SB bx normal, no source of IDA seen on colonoscopy. Iron  infusion 09/06/2020   LAP-BAND surgery status    Pt getting lap band removed and is getting gastric sleave after.   Migraine syndrome    Ovarian cyst 10/2015   LEFT, 4 cm--noted on L spine MRI w/out contrast.  F/u u/s imaging showed simple cyst.  Right tibial fracture    Fragility fracture 2024/2025-->calcitonin + bisphosphonate per ortho   Spondylarthrosis    Tobacco dependence in remission    quit 2018    Past Surgical History:  Procedure Laterality Date   BACK SURGERY  2019   CARPAL TUNNEL RELEASE Left    CERVICAL CONE BIOPSY     COLONOSCOPY     2015 normal.  08/2020 (for IDA)->incomplete prep, no adenomas->rpt 5 yrs d/t incomplete prep.   ESOPHAGOGASTRODUODENOSCOPY N/A 04/27/2014   Procedure: ESOPHAGOGASTRODUODENOSCOPY (EGD);  Surgeon: Alm Angle, MD;  Location: [facility];  Service:  General;  Laterality: N/A;   ESOPHAGOGASTRODUODENOSCOPY  08/29/2020   6 cm hiatal hernia noted, o/w normal, SB bx neg.  ?hernia contributing to pt's IDA?   GASTRIC BANDING PORT REVISION N/A 10/02/2014   Procedure: LAPROSCOPIC PLACEMENT OF LAP BAND PORT;  Surgeon: Morene Olives, MD;  Location: [facility];  Service: General;  Laterality: N/A;   IVC FILTER INSERTION N/A 07/27/2023   Procedure: IVC FILTER INSERTION;  Surgeon: Marea Selinda RAMAN, MD;  Location: [facility];  Service: Cardiovascular;  Laterality: N/A;   LAPAROSCOPIC GASTRIC BANDING  03/2007   [facility] - [facility]; Dr Velinda Hipp   LAPAROSCOPIC REVISION OF GASTRIC BAND N/A 05/17/2012   Procedure: LAPAROSCOPIC REVISION OF SLIPPED GASTRIC BAND;  Surgeon: Morene ONEIDA Olives, MD;  Location: [facility];  Service: General;  Laterality: N/A;   LAPAROSCOPY N/A 04/30/2014   Procedure: DIAGNOSTIC LAPAROSCOPY WITH REMOVAL OF INFECTED LAP BAND PORT WITH DEBRIDEMENT SUBCUTANEOUS ABSCESS;  Surgeon: Camellia Blush, MD;  Location: [facility];  Service: General;  Laterality: N/A;   TOTAL KNEE ARTHROPLASTY Right 07/30/2023   Procedure: ARTHROPLASTY, KNEE, TOTAL;  Surgeon: Lorelle Hussar, MD;  Location: [facility];  Service: Orthopedics;  Laterality: Right;   VARICOSE VEIN SURGERY Bilateral 2009   Social History   Socioeconomic History   Marital status: Married    Spouse name: Not on file   Number of children: Not on file   Years of education: Not on file   Highest education level: Not on file  Occupational History   Not on file  Tobacco Use   Smoking status: Former    Average packs/day: 0.3 packs/day for 5.0 years (1.3 ttl pk-yrs)    Types: Cigarettes    Start date: 2016   Smokeless tobacco: Never  Vaping Use   Vaping status: Never Used  Substance and Sexual Activity   Alcohol use: No   Drug use: No   Sexual activity: Yes    Birth control/protection: None  Other Topics Concern   Not on file  Social History Narrative   Married,  no children.   Occup: Retail banker   Tobacco: 6 pack-yr hx--quit with wellbutrin .   No alcohol or drugs.   Social Drivers of Corporate investment banker Strain: Low Risk  (08/18/2023)   Received from [facility]   Overall Financial Resource Strain (CARDIA)    Difficulty of Paying Living Expenses: Not hard at all  Food Insecurity: No Food Insecurity (08/18/2023)   Received from [facility]   Hunger Vital Sign    Within the past 12 months, you worried that your food would run out before you got the money to buy more.: Never true    Within the past 12 months, the food you bought just didn't last and you didn't have money to get more.: Never true  Transportation Needs: No Transportation Needs (08/18/2023)   Received  from [facility] - [facility]    In the past 12 months, has lack of transportation kept you from medical appointments or from getting medications?: No    Lack of Transportation (Non-Medical): No  Physical Activity: Not on file  Stress: Not on file  Social Connections: Unknown (05/19/2021)   Received from [facility]   Social Network    Social Network: Not on file   Family History  Problem Relation Age of Onset   Alcohol abuse Mother    Drug abuse Mother    Arthritis Mother    Hyperlipidemia Mother    Heart disease Mother    Hypertension Mother    Non-Hodgkin's lymphoma Mother    Lung cancer Mother    Alcohol abuse Father    Arthritis Father    Hyperlipidemia Father    Heart disease Father    Stroke Father    Hypertension Father    Mental illness Father    Diabetes Father    Hyperlipidemia Sister    Hypertension Sister    Arthritis Maternal Grandmother    Cancer Maternal Grandmother    Hypertension Maternal Grandmother    Arthritis Maternal Grandfather    Arthritis Paternal Grandmother    Cancer Paternal Grandmother    Hyperlipidemia Paternal Grandmother    Heart disease Paternal  Grandmother    Hypertension Paternal Grandmother    Diabetes Paternal Grandmother    Arthritis Paternal Grandfather    Hyperlipidemia Paternal Grandfather    Heart disease Paternal Grandfather    Stroke Paternal Grandfather    Hypertension Paternal Grandfather    ROS:   Outpatient Medications Prior to Visit  Medication Sig Dispense Refill   acetaminophen  (TYLENOL ) 500 MG tablet Take 2 tablets (1,000 mg total) by mouth every 8 (eight) hours. 30 tablet 0   baclofen (LIORESAL) 20 MG tablet Take 10-20 mg by mouth 4 (four) times daily as needed for muscle spasms.     cetirizine (ZYRTEC) 10 MG tablet Take 20 mg by mouth daily.     DULoxetine  (CYMBALTA ) 60 MG capsule Take 1 capsule (60 mg total) by mouth daily. 90 capsule 3   gabapentin  (NEURONTIN ) 800 MG tablet Take 800 mg by mouth in the morning, at noon, in the evening, and at bedtime.  6   lisinopril  (ZESTRIL ) 40 MG tablet Take 1 tablet (40 mg total) by mouth daily. 90 tablet 3   meloxicam (MOBIC) 15 MG tablet Take 15 mg by mouth daily as needed for pain.     methylphenidate  (CONCERTA ) 36 MG PO CR tablet Take 1 tablet (36 mg total) by mouth daily. 30 tablet 0   Multiple Vitamin (MULITIVITAMIN WITH MINERALS) TABS Take 1 tablet by mouth daily.     NARCAN 4 MG/0.1ML LIQD nasal spray kit Place 0.4 mg into the nose once.     Oxycodone  HCl 10 MG TABS Take 10 mg by mouth every 6 (six) hours. Do not fill until 05/26/2022 for 30 days.     pantoprazole  (PROTONIX ) 40 MG tablet Take 1 tablet (40 mg total) by mouth 2 (two) times daily. 180 tablet 3   rizatriptan  (MAXALT -MLT) 10 MG disintegrating tablet TAKE 1 TABLET BY MOUTH AS NEEDED FOR MIGRAINE. MAY REPEAT IN 2 HOURS IF NEEDED 10 tablet 0   albuterol  (VENTOLIN  HFA) 108 (90 Base) MCG/ACT inhaler INHALE 2 PUFFS INTO THE LUNGS EVERY 4 (FOUR) HOURS AS NEEDED FOR WHEEZING OR SHORTNESS OF BREATH. 8 g 0   oxyCODONE  (OXY IR/ROXICODONE ) 5 MG  immediate release tablet Take 1 tablet (5 mg total) by mouth every 4  (four) hours as needed for severe pain (pain score 7-10) or breakthrough pain. (Patient not taking: Reported on 09/09/2023) 30 tablet 0   apixaban  (ELIQUIS ) 2.5 MG TABS tablet Take 1 tablet (2.5 mg total) by mouth 2 (two) times daily for 28 days. (Patient not taking: Reported on 09/08/2023) 56 tablet 0   chlorhexidine  (HIBICLENS ) 4 % external liquid Apply 15 mLs (1 Application total) topically as directed for 30 doses. Use as directed daily for 5 days every other week for 6 weeks. (Patient not taking: Reported on 09/08/2023) 946 mL 1   docusate sodium  (COLACE) 100 MG capsule Take 1 capsule (100 mg total) by mouth 2 (two) times daily. 10 capsule 0   ondansetron  (ZOFRAN ) 4 MG tablet Take 1 tablet (4 mg total) by mouth every 6 (six) hours as needed for nausea. (Patient not taking: Reported on 09/08/2023) 20 tablet 0   No facility-administered medications prior to visit.    Allergies  Allergen Reactions   Meperidine Hives and Nausea And Vomiting   Vancomycin  Anaphylaxis and Itching    Face/back warm and red.  Pt lips and tongue swelled, hives   Demerol Hives and Nausea And Vomiting   Microplegia Msa-Msg  [Cardioplegia Del Nido Formula] Other (See Comments)    Other reaction(s): headache   Monosodium Glutamate Nausea And Vomiting and Other (See Comments)    migraines    Tape Rash    Plastic tape    Review of Systems  Constitutional:  Negative for appetite change, chills, fatigue and fever.  HENT:  Negative for congestion, dental problem, ear pain and sore throat.   Eyes:  Negative for discharge, redness and visual disturbance.  Respiratory:  Negative for cough, chest tightness, shortness of breath and wheezing.   Cardiovascular:  Negative for chest pain, palpitations and leg swelling.  Gastrointestinal:  Negative for abdominal pain, blood in stool, diarrhea, nausea and vomiting.  Genitourinary:  Negative for difficulty urinating, dysuria, flank pain, frequency, hematuria and urgency.   Musculoskeletal:  Positive for arthralgias (R knee, improving with PT). Negative for back pain, joint swelling, myalgias and neck stiffness.  Skin:  Negative for pallor and rash.  Neurological:  Negative for dizziness, speech difficulty, weakness and headaches.  Hematological:  Negative for adenopathy. Does not bruise/bleed easily.  Psychiatric/Behavioral:  Negative for confusion and sleep disturbance. The patient is not nervous/anxious.    As per HPI  PE:    09/09/2023    1:34 PM 09/08/2023    3:29 PM 07/31/2023   10:27 AM  Vitals with BMI  Height 5' 3 5' 3   Weight 237 lbs 6 oz 236 lbs   BMI 42.06 41.82   Systolic 125 113 886  Diastolic 73 78 54  Pulse 82 73      Physical Exam  Gen: Alert, well appearing.  Patient is oriented to person, place, time, and situation. AFFECT: pleasant, lucid thought and speech. ENT: Ears: EACs clear, normal epithelium.  TMs with good light reflex and landmarks bilaterally.  Eyes: no injection, icteris, swelling, or exudate.  EOMI, PERRLA. Nose: no drainage or turbinate edema/swelling.  No injection or focal lesion.  Mouth: lips without lesion/swelling.  Oral mucosa pink and moist.  Dentition intact and without obvious caries or gingival swelling.  Oropharynx without erythema, exudate, or swelling.  Neck: supple/nontender.  No LAD, mass, or TM.  Carotid pulses 2+ bilaterally, without bruits. CV: RRR, no m/r/g.  LUNGS: CTA bilat, nonlabored resps, good aeration in all lung fields. EXT: no clubbing, cyanosis, or pitting edema.  She has scattered noninflamed varicosities on her lower legs. Left calf circumference measured 10 cm below the inferior border of the patella is 45 cm.  Right side 46 cm.  No calf tenderness. Right knee is without swelling, warmth, or erythema.  Incision has healed well. Musculoskeletal: no joint swelling, erythema, warmth, or tenderness.  ROM of all joints intact. Skin - no sores or suspicious lesions or rashes or color  changes  LABS:  Last CBC Lab Results  Component Value Date   WBC 10.3 07/31/2023   HGB 8.4 (L) 07/31/2023   HCT 27.7 (L) 07/31/2023   MCV 75.3 (L) 07/31/2023   MCH 22.8 (L) 07/31/2023   RDW 18.5 (H) 07/31/2023   PLT 216 07/31/2023   Lab Results  Component Value Date   IRON  19 (L) 05/27/2023   TIBC 408 05/27/2023   FERRITIN 9 (L) 05/27/2023   Last metabolic panel Lab Results  Component Value Date   GLUCOSE 108 (H) 07/31/2023   NA 138 07/31/2023   K 3.6 07/31/2023   CL 103 07/31/2023   CO2 27 07/31/2023   BUN 10 07/31/2023   CREATININE 0.56 07/31/2023   GFRNONAA >60 07/31/2023   CALCIUM 8.3 (L) 07/31/2023   PROT 6.8 07/24/2023   ALBUMIN 3.7 07/24/2023   BILITOT 0.6 07/24/2023   ALKPHOS 50 07/24/2023   AST 22 07/24/2023   ALT 17 07/24/2023   ANIONGAP 8 07/31/2023   Last lipids Lab Results  Component Value Date   CHOL 212 (H) 06/06/2022   HDL 58.00 06/06/2022   LDLCALC 135 (H) 06/06/2022   TRIG 96.0 06/06/2022   CHOLHDL 4 06/06/2022   Last thyroid  functions Lab Results  Component Value Date   TSH 1.60 06/06/2022   IMPRESSION AND PLAN:  #1 health maintenance exam: Reviewed age and gender appropriate health maintenance issues (prudent diet, regular exercise, health risks of tobacco and excessive alcohol, use of seatbelts, fire alarms in home, use of sunscreen).  Also reviewed age and gender appropriate health screening as well as vaccine recommendations. Labs: will check a CBC, iron  panel, CMP, TSH, and FLP. Vaccines: Flu vaccine-->given today.  Otherwise All UTD. Colon ca screening: 2022 colonoscopy showed no polyps but prep was incomplete.  Recall 5 years was recommended. Breast cancer screening: mammogram is needed->ordered today. Cervical cancer screening: last pap 12/10/20 normal.  Rpt 3 yrs (hx of cerv dysplasia)-->she'll make appt.  #2 hypertension, well-controlled on lisinopril  40 mg a day. Electrolytes and creatinine monitoring today.  3.  Adult  ADD, doing well on Concerta  36 mg a day. A new prescription was not needed today.  #4 iron  deficiency anemia. She was unable to get the iron  infusions that we had planned back in May of this year. Will get this set up.  CBC and iron  panel today.  5.  History of DVT, doing well off of Eliquis . At her follow-up visit with vascular yesterday I documented that noninvasive studies showed no evidence of DVT yesterday. She will be getting IVC filter removed soon.  #6 GAD with recurrent major depressive disorder, in remission. All stable on duloxetine  60 mg a day.  An After Visit Summary was printed and given to the patient.  FOLLOW UP: Return in about 3 months (around 12/09/2023) for routine chronic illness f/u.  Signed:  Gerlene Hockey, MD           09/09/2023

## 2023-09-09 NOTE — Patient Instructions (Signed)

## 2023-09-10 ENCOUNTER — Other Ambulatory Visit

## 2023-09-10 ENCOUNTER — Telehealth (INDEPENDENT_AMBULATORY_CARE_PROVIDER_SITE_OTHER): Payer: Self-pay

## 2023-09-10 LAB — COMPREHENSIVE METABOLIC PANEL WITH GFR
ALT: 8 U/L (ref 0–35)
AST: 13 U/L (ref 0–37)
Albumin: 3.7 g/dL (ref 3.5–5.2)
Alkaline Phosphatase: 73 U/L (ref 39–117)
BUN: 7 mg/dL (ref 6–23)
CO2: 32 meq/L (ref 19–32)
Calcium: 9.1 mg/dL (ref 8.4–10.5)
Chloride: 100 meq/L (ref 96–112)
Creatinine, Ser: 0.55 mg/dL (ref 0.40–1.20)
GFR: 107.5 mL/min (ref >60.00)
Glucose, Bld: 82 mg/dL (ref 70–99)
Potassium: 4.1 meq/L (ref 3.5–5.1)
Sodium: 140 meq/L (ref 135–145)
Total Bilirubin: 0.5 mg/dL (ref 0.2–1.2)
Total Protein: 6.8 g/dL (ref 6.0–8.3)

## 2023-09-10 LAB — LIPID PANEL
Cholesterol: 181 mg/dL (ref 0–200)
HDL: 55.8 mg/dL (ref >39.00)
LDL Cholesterol: 103 mg/dL — ABNORMAL HIGH (ref 0–99)
NonHDL: 125.03
Total CHOL/HDL Ratio: 3
Triglycerides: 110 mg/dL (ref 0.0–149.0)
VLDL: 22 mg/dL (ref 0.0–40.0)

## 2023-09-10 LAB — CBC
HCT: 32.5 % — ABNORMAL LOW (ref 36.0–46.0)
Hemoglobin: 9.8 g/dL — ABNORMAL LOW (ref 12.0–15.0)
MCHC: 30.3 g/dL (ref 30.0–36.0)
MCV: 73.2 fl — ABNORMAL LOW (ref 78.0–100.0)
Platelets: 222 K/uL (ref 150.0–400.0)
RBC: 4.44 Mil/uL (ref 3.87–5.11)
RDW: 18.4 % — ABNORMAL HIGH (ref 11.5–15.5)
WBC: 5.4 K/uL (ref 4.0–10.5)

## 2023-09-10 LAB — IRON,TIBC AND FERRITIN PANEL
%SAT: 7 % — ABNORMAL LOW (ref 16–45)
Ferritin: 10 ng/mL — ABNORMAL LOW (ref 16–232)
Iron: 27 ug/dL — ABNORMAL LOW (ref 40–190)
TIBC: 381 ug/dL (ref 250–450)

## 2023-09-10 LAB — TSH: TSH: 3.04 u[IU]/mL (ref 0.35–5.50)

## 2023-09-10 NOTE — Telephone Encounter (Signed)
I attempted to contact the patient to schedule her for a IVC filter removal with Dr. Lucky Cowboy. A message was left for a return call.

## 2023-09-11 ENCOUNTER — Ambulatory Visit: Payer: Self-pay | Admitting: Family Medicine

## 2023-09-11 ENCOUNTER — Other Ambulatory Visit
Admission: RE | Admit: 2023-09-11 | Discharge: 2023-09-11 | Disposition: A | Discharge status: Home / Self Care | Payer: Self-pay | Source: Ambulatory Visit | Attending: Medical Genetics | Admitting: Medical Genetics

## 2023-09-11 DIAGNOSIS — D508 Other iron deficiency anemias: Secondary | ICD-10-CM

## 2023-09-11 NOTE — Progress Notes (Signed)
 [facility]                    PT Daily Progress Note  Patient's Name:Samantha Doyle                        MRN #[id number]      Date:09/11/2023 Visit Number: 6   Progress Related to Long and Short Term Goals:                                                                                            [x]  ROM: R knee flex 120 []  Strength Pain -     while- [] at rest    [] During activity:  []  Functional goal:  where 0 = completely disabled, 100 equals full function.      []  Other: []  Balance:  Patient / Family Education:    []  Progressed HEP to include:  []  Reviewed previous HEP:   ___________________________________________________________________________________   Skilled Service Provided:  To: [x]  right   []  left     []  bilateral    []   core/body stability and balance  []  neck   []   shoulder   []  upper arm/  []  elbow   []  lower arm  []  wrist   []  hand   []   fingers  []  back     []  hip    []  upper leg    [x]  knee  []  lower leg   []   ankle  foot             Ther Ex Therapeutic Procedure CPT 97110 : 30 minutes         Manual Tx        E-Stim         Infrared     US  x min             Ice / Heat                Vaso-pneumaticcompress             [x]  ROM [x] joint mob [] inflam Setting: [] Circ  [] pain [] swelling  [x] Flex [x] Flex [] pain [] Healing [] pain [] inflam [] edema  [] Bal [x]  ROM [] swelling [] pain [] swelling [] swelling Temp:         F  Strength       [x]   [] ASTYM [] circulation [] circulation [] inflam [] circulation [] soft tissue mob   Level      [] -1        [] -2      [] -3  Progressive     E  HEP    Exercise          []   [] tension/spasms/soft tissue Mob [] tissue mob       Posture        []   [] Correct malaign [] tissue mob        Settings:        Subjective:  No complaints regarding knee today.  Treatment notes: ROM 122/0     Assessment of Treatment: The patient completed therapeutic exercises without difficulty per  flowsheet. Knee strength and ROM are progressing  well.  Patient's Response to Treatment:  []  pain  []  pain    []   flexibility      [x]  endurance      [x]   strength     [x]   AROM      [x]   PROM       []   Spasms    []  Posture / alignment    []   Joint Mobility  []  Other:   Functional Improvement Noted:  Increased ability to  []  walk   []  stand for longer periods   []  dress with less help    []  lift     []   reach     []   bend    []  Other:  Remaining Impairment Requiring Continued Treatment:   [x]   flexibility   [x]   ROM   [x]   pain [x]  strength   [x]   weakness    []   balance   []   swelling   []   inflammation   []  Spasm  []  posture/alignment    []   Other:  Plan: [x]   Continue Plan of Care        []   Add:         []   Discharge after next visit    []   Discharge to HEP   []  Other:  PT Billing Documentation Date of Onset: 07/30/23 Visit Number: 6       Frequently Used Timed Codes Therapeutic Procedure CPT 97110 : 30 minutes                  Total Treatment Time: 30 minutes     Randine LITTIE Muss, PT  Hip/Knee Treatment Log   Date   08/18/23 8/18 8/22 8/25 8/27 9/5      Bike(seat____)            Stepper (seat     ) S1 10 S1 10  L2 10' L2 10 L2 10' L2 10      Elliptical              LP  Squats/Plyo            LP  Single Leg            LP  calf raises  2x10 3x10 3x10 3x10 3x10      Wall Slide/mini/BOSU  2x10 3x10 3x10 3x10 3x10      Stool Scoots            Step (up,down,lateral)  4 2x10 4 3x10 6 2x10 6 3x10 6 3x10      Multi-Hip TKE  35 2x10 35 3x10 35 3x10 35 3x10 45 3x10      Multi-Hip Flex/Abd  35 2x10 35 2x10 35 3x10 35 3x10 45 3x10      Multi-Hip Ext/Add  35 2x10 35 3x10 35 3x10 35 3x10 45 3x10      Knee Extension(single/Bilat)            Leg Curls(single/Bilat)            SAQ/LAQ  2x10 3x10 3x10 3x10 3x10      SLR (flex/abd/add/ext)  2x10 3x10 3x10 3x10 3x10      Quad Sets  2x10 3x10 3x10 3x10 3x10      Lunges            NMR: 3 point            NMR: T-band proprio  NMR: Wobble Board/BOSU            NMR: Geophysical data processor-            PROM  10 10' 10 10'       Ultrasound/ Infrared            HP/CP with/ without  IFC            Vasopneumatic Level 1/2/3  L2 10 L2 10 10' L2 10 10'

## 2023-09-11 NOTE — Telephone Encounter (Signed)
 Patient calls back and is scheduled with Dr. Marea on 09/21/23 with a 1:30 pm arrival time to the [facility] for a IVC filter removal. Pre-procedure instructions were discussed and will be sent to Mychart and mailed.

## 2023-09-14 ENCOUNTER — Telehealth (HOSPITAL_COMMUNITY): Payer: Self-pay

## 2023-09-14 ENCOUNTER — Other Ambulatory Visit: Payer: Self-pay | Admitting: Family Medicine

## 2023-09-14 NOTE — Telephone Encounter (Signed)
 Auth Submission: NO AUTH NEEDED Site of care: Site of care: [facility] Payer: [employer] Commercial Medication & CPT/J Code(s) submitted: Venofer  (Iron  Sucrose) J1756 Diagnosis Code: D50.9 Route of submission (phone, fax, portal):  Phone # Fax # Auth type: Buy/Bill HB Units/visits requested: 300mg  x 3 doses Reference number:  Approval from: 09/14/23 to 12/14/23

## 2023-09-14 NOTE — Progress Notes (Signed)
 Spoke w/ Dr. McGowen and will switch iron  to Venofer  300 mg IV x 3 due to Venofer  being the preferred product.  Devondre Guzzetta D. Jerika Wales, PharmD

## 2023-09-21 ENCOUNTER — Encounter
Admission: RE | Disposition: A | Discharge status: Home / Self Care | Payer: Self-pay | Source: Home / Self Care | Attending: Vascular Surgery

## 2023-09-21 ENCOUNTER — Ambulatory Visit
Admission: RE | Admit: 2023-09-21 | Discharge: 2023-09-21 | Disposition: A | Discharge status: Home / Self Care | Attending: Vascular Surgery | Admitting: Vascular Surgery

## 2023-09-21 ENCOUNTER — Other Ambulatory Visit: Payer: Self-pay

## 2023-09-21 DIAGNOSIS — Z87891 Personal history of nicotine dependence: Secondary | ICD-10-CM | POA: Diagnosis not present

## 2023-09-21 DIAGNOSIS — I10 Essential (primary) hypertension: Secondary | ICD-10-CM | POA: Diagnosis not present

## 2023-09-21 DIAGNOSIS — Z9889 Other specified postprocedural states: Secondary | ICD-10-CM | POA: Diagnosis not present

## 2023-09-21 DIAGNOSIS — R609 Edema, unspecified: Secondary | ICD-10-CM | POA: Diagnosis not present

## 2023-09-21 DIAGNOSIS — Z4589 Encounter for adjustment and management of other implanted devices: Secondary | ICD-10-CM

## 2023-09-21 DIAGNOSIS — Z86718 Personal history of other venous thrombosis and embolism: Secondary | ICD-10-CM | POA: Diagnosis not present

## 2023-09-21 DIAGNOSIS — Z96651 Presence of right artificial knee joint: Secondary | ICD-10-CM | POA: Insufficient documentation

## 2023-09-21 DIAGNOSIS — Z7901 Long term (current) use of anticoagulants: Secondary | ICD-10-CM | POA: Diagnosis not present

## 2023-09-21 DIAGNOSIS — I82409 Acute embolism and thrombosis of unspecified deep veins of unspecified lower extremity: Secondary | ICD-10-CM

## 2023-09-21 HISTORY — PX: IVC FILTER REMOVAL: CATH118246

## 2023-09-21 LAB — GENECONNECT MOLECULAR SCREEN: Genetic Analysis Overall Interpretation: NEGATIVE

## 2023-09-21 SURGERY — IVC FILTER REMOVAL
Anesthesia: Moderate Sedation

## 2023-09-21 MED ORDER — MIDAZOLAM HCL 2 MG/ML PO SYRP: 8.0000 mg | ORAL_SOLUTION | Freq: Once | ORAL | Status: DC | PRN

## 2023-09-21 MED ORDER — DIPHENHYDRAMINE HCL 50 MG/ML IJ SOLN: 50.0000 mg | Freq: Once | INTRAMUSCULAR | Status: DC | PRN

## 2023-09-21 MED ORDER — LIDOCAINE-EPINEPHRINE (PF) 1 %-1:200000 IJ SOLN
INTRAMUSCULAR | Status: DC | PRN
  Administered 2023-09-21: 10 mL

## 2023-09-21 MED ORDER — CEFAZOLIN SODIUM-DEXTROSE 2-4 GM/100ML-% IV SOLN
2.0000 g | INTRAVENOUS | Status: AC
  Administered 2023-09-21: 2 g via INTRAVENOUS

## 2023-09-21 MED ORDER — MIDAZOLAM HCL 2 MG/2ML IJ SOLN
INTRAMUSCULAR | Status: DC | PRN
  Administered 2023-09-21: 2 mg via INTRAVENOUS

## 2023-09-21 MED ORDER — MIDAZOLAM HCL 2 MG/2ML IJ SOLN
INTRAMUSCULAR | Status: AC
  Filled 2023-09-21: qty 2

## 2023-09-21 MED ORDER — FENTANYL CITRATE PF 50 MCG/ML IJ SOSY
PREFILLED_SYRINGE | INTRAMUSCULAR | Status: AC
  Filled 2023-09-21: qty 1

## 2023-09-21 MED ORDER — ONDANSETRON HCL 4 MG/2ML IJ SOLN: 4.0000 mg | Freq: Four times a day (QID) | INTRAMUSCULAR | Status: DC | PRN

## 2023-09-21 MED ORDER — FENTANYL CITRATE (PF) 100 MCG/2ML IJ SOLN
INTRAMUSCULAR | Status: DC | PRN
  Administered 2023-09-21: 50 ug via INTRAVENOUS

## 2023-09-21 MED ORDER — FAMOTIDINE 20 MG PO TABS: 40.0000 mg | ORAL_TABLET | Freq: Once | ORAL | Status: DC | PRN

## 2023-09-21 MED ORDER — IODIXANOL 320 MG/ML IV SOLN
INTRAVENOUS | Status: DC | PRN
  Administered 2023-09-21: 10 mL

## 2023-09-21 MED ORDER — METHYLPREDNISOLONE SODIUM SUCC 125 MG IJ SOLR: 125.0000 mg | Freq: Once | INTRAMUSCULAR | Status: DC | PRN

## 2023-09-21 MED ORDER — HEPARIN (PORCINE) IN NACL 1000-0.9 UT/500ML-% IV SOLN
INTRAVENOUS | Status: DC | PRN
  Administered 2023-09-21: 500 mL

## 2023-09-21 MED ORDER — SODIUM CHLORIDE 0.9 % IV SOLN: INTRAVENOUS | Status: DC

## 2023-09-21 MED ORDER — CEFAZOLIN SODIUM-DEXTROSE 2-4 GM/100ML-% IV SOLN
INTRAVENOUS | Status: AC
  Filled 2023-09-21: qty 100

## 2023-09-21 MED ORDER — HYDROMORPHONE HCL 1 MG/ML IJ SOLN: 1.0000 mg | Freq: Once | INTRAMUSCULAR | Status: DC | PRN

## 2023-09-21 SURGICAL SUPPLY — 4 items
COVER PROBE ULTRASOUND 5X96 (MISCELLANEOUS) IMPLANT
KIT SNARE RETRIEVAL 3-LOOP (MISCELLANEOUS) IMPLANT
PACK ANGIOGRAPHY (CUSTOM PROCEDURE TRAY) ×1 IMPLANT
WIRE J 3MM .035X145CM (WIRE) IMPLANT

## 2023-09-21 NOTE — Op Note (Signed)
 [facility]   OPERATIVE NOTE    PRE-OPERATIVE DIAGNOSIS:  1. DVT 2. status post IVC filter placement  POST-OPERATIVE DIAGNOSIS: Same as above  PROCEDURE: 1.   Ultrasound guidance for vascular access right jugular vein 2.   Catheter placement into inferior vena cava from right jugular vein 3.   Inferior venacavogram 4.   Retrieval of option Elite IVC filter  SURGEON: Selinda Gu, MD  ASSISTANT(S): None  ANESTHESIA: Local with moderate conscious sedation for approximately 13 minutes using 2 mg of Versed  and 50 mcg of Fentanyl   ESTIMATED BLOOD LOSS: 5 cc  CONTRAST:  15 cc  FLUORO TIME: 0.7 minutes  FINDING(S): 1.  Patent IVC, filter able to be easily retrieved  SPECIMEN(S):  IVC filter  INDICATIONS:    Patient is a 48 y.o. female who presents with a previous history of IVC filter placement. Patient has rehabilitated from her knee replacement and resumed her medications and no longer needs this filter. The patient remains on anticoagulation. Risks and benefits were discussed, and informed consent was obtained.  DESCRIPTION: After obtaining full informed written consent, the patient was brought back to the vascular suite and placed supine upon the table. Moderate conscious sedation was administered during a face to face encounter with the patient throughout the procedure with my supervision of the RN administering medicines and monitoring the patient's vital signs, pulse oximetry, telemetry and mental status throughout from the start of the procedure until the patient was taken to the recovery room.  After obtaining adequate anesthesia, the patient was prepped and draped in the standard fashion.  The right jugular vein was visualized with ultrasound and found to be widely patent. It was then accessed under direct ultrasound guidance without difficulty with the Seldinger needle and a permanent image was recorded. A J-wire was placed. After skin nick and  dilatation, the retrieval sheath was placed over the wire and advanced into the inferior vena cava. Inferior vena cava was imaged and found to be widely patent on inferior venacavogram. The filter was fairly straight in its orientation.  The IVC was patent.  The retrieval snare was then placed through the sheath and the hook of the filter was snared without difficulty. The sheath was then advanced, and the filter was collapsed and brought into the sheath in its entirety. It was then removed from the body in its entirety. The retrieval sheath was then removed. Pressure was held at the access site and sterile dressing was placed. The patient was taken to the recovery room in stable condition having tolerated the procedure well.  COMPLICATIONS: None  CONDITION:  Stable   Selinda Gu 09/21/2023 3:21 PM  This note was created with Dragon Medical transcription system. Any errors in dictation are purely unintentional.

## 2023-09-21 NOTE — Interval H&P Note (Signed)
 History and Physical Interval Note:  09/21/2023 2:54 PM  Samantha Doyle  has presented today for surgery, with the diagnosis of IVC filter removal    DVT.  The various methods of treatment have been discussed with the patient and family. After consideration of risks, benefits and other options for treatment, the patient has consented to  Procedure(s): IVC FILTER REMOVAL (N/A) as a surgical intervention.  The patient's history has been reviewed, patient examined, no change in status, stable for surgery.  I have reviewed the patient's chart and labs.  Questions were answered to the patient's satisfaction.     Sarahi Borland

## 2023-09-21 NOTE — Discharge Instructions (Signed)
 Inferior Vena Cava Filter Removal, Care After The following information offers guidance on how to care for yourself after your procedure. Your health care provider may also give you more specific instructions. If you have problems or questions, contact your health care provider. What can I expect after the procedure? After the procedure, it is common to have: Mild pain and bruising around the area where the long, thin tube (catheter) was removed from  your neck  Tiredness (fatigue). Follow these instructions at home: Insertion site care  Follow instructions from your health care provider about how to take care of your catheter insertion site. Make sure you: Wash your hands with soap and water for at least 20 seconds before and after you change your bandage (dressing). If soap and water are not available, use hand sanitizer. Remove your dressing in 2 days Keep the dressing and the insertion site clean and dry. Check your insertion site every day for signs of infection. Check for: More redness, swelling, or pain. Fluid or blood. Warmth. Pus or a bad smell. You may shower tomorrow with bandage on keep as dry as possible Activity Avoid strenuous exercise or activities that take a lot of effort for 48 hours after the procedure or as told by your health care provider. Return to your normal activities tomorrow You may have to avoid lifting. For 48 hours General instructions Take over-the-counter and prescription medicines only as told by your health care provider. If you were given a sedative during the procedure, it can affect you for several hours. Do not drive or operate machinery for 24 hours. Keep all follow-up visits. This is important. Contact a health care provider if: You have any of these signs of infection: More redness, swelling, or pain around your catheter insertion site. Fluid or blood coming from your insertion site. Warmth coming from your insertion site. Pus or a bad smell  coming from your insertion site. Chills or a fever. You are dizzy. You have nausea or vomiting. You develop a rash. Get help right away if: You have blood coming from your catheter insertion site (active bleeding). If you have bleeding from the insertion site, lie down, apply pressure to the area with a clean cloth or gauze, and get help right away. You have chest pain, a cough, or difficulty breathing. You have shortness of breath, feel faint, or pass out. You cough up blood. You have severe pain in your abdomen. You develop swelling and discoloration or pain in your legs. Your legs become pale and cold or blue. You have weakness, difficulty moving your arms or legs, or balance problems. You develop problems with speech or vision. These symptoms may be an emergency. Get help right away. Call 911. Do not wait to see if the symptoms will go away. Do not drive yourself to the hospital. Summary Follow instructions from your health care provider about how to take care of your catheter insertion site. Return to your normal activities as told by your health care provider. Check your catheter insertion site every day for signs of infection. Get help right away if you have active bleeding, chest pain, or trouble breathing. This information is not intended to replace advice given to you by your health care provider. Make sure you discuss any questions you have with your health care provider. Document Revised: 01/08/2021 Document Reviewed: 01/08/2021 Elsevier Patient Education  2023 [employer].

## 2023-09-22 ENCOUNTER — Encounter: Payer: Self-pay | Admitting: Vascular Surgery

## 2023-09-23 ENCOUNTER — Encounter: Payer: Self-pay | Admitting: Family Medicine

## 2023-09-23 MED ORDER — METHYLPHENIDATE HCL ER (OSM) 36 MG PO TBCR: 36.0000 mg | EXTENDED_RELEASE_TABLET | Freq: Every day | ORAL | 0 refills | Status: DC

## 2023-09-23 NOTE — Telephone Encounter (Signed)
 Requesting: methylphenidate  Contract: 05/27/23 UDS: 05/27/23 Last Visit: 09/09/23 Next Visit: advised to f/u 3 months Last Refill: 08/26/23 (30,0)  Please Advise. Rx pending

## 2023-10-02 ENCOUNTER — Other Ambulatory Visit: Payer: Self-pay | Admitting: Family Medicine

## 2023-10-02 ENCOUNTER — Ambulatory Visit

## 2023-10-02 ENCOUNTER — Ambulatory Visit
Admission: RE | Admit: 2023-10-02 | Discharge: 2023-10-02 | Disposition: A | Discharge status: Home / Self Care | Source: Ambulatory Visit | Attending: Family Medicine | Admitting: Family Medicine

## 2023-10-02 VITALS — BP 129/84 | HR 67 | Temp 97.8°F | Resp 18 | Ht 63.0 in | Wt 235.0 lb

## 2023-10-02 DIAGNOSIS — D509 Iron deficiency anemia, unspecified: Secondary | ICD-10-CM | POA: Diagnosis present

## 2023-10-02 DIAGNOSIS — Z1231 Encounter for screening mammogram for malignant neoplasm of breast: Secondary | ICD-10-CM

## 2023-10-02 MED ORDER — IRON SUCROSE 300 MG IVPB - SIMPLE MED
300.0000 mg | Status: DC
  Administered 2023-10-02: 300 mg via INTRAVENOUS
  Filled 2023-10-02: qty 300

## 2023-10-08 ENCOUNTER — Ambulatory Visit
Admission: RE | Admit: 2023-10-08 | Discharge: 2023-10-08 | Disposition: A | Discharge status: Home / Self Care | Source: Ambulatory Visit | Attending: Family Medicine | Admitting: Family Medicine

## 2023-10-08 DIAGNOSIS — Z1231 Encounter for screening mammogram for malignant neoplasm of breast: Secondary | ICD-10-CM

## 2023-10-09 ENCOUNTER — Ambulatory Visit
Admission: RE | Admit: 2023-10-09 | Discharge: 2023-10-09 | Disposition: A | Discharge status: Home / Self Care | Source: Ambulatory Visit | Attending: Family Medicine | Admitting: Family Medicine

## 2023-10-09 VITALS — BP 136/87 | HR 70 | Temp 98.3°F | Resp 18

## 2023-10-09 DIAGNOSIS — D509 Iron deficiency anemia, unspecified: Secondary | ICD-10-CM | POA: Insufficient documentation

## 2023-10-09 MED ORDER — IRON SUCROSE 300 MG IVPB - SIMPLE MED
300.0000 mg | Status: DC
  Administered 2023-10-09: 300 mg via INTRAVENOUS
  Filled 2023-10-09: qty 300

## 2023-10-14 ENCOUNTER — Other Ambulatory Visit: Payer: Self-pay | Admitting: Family Medicine

## 2023-10-14 DIAGNOSIS — R928 Other abnormal and inconclusive findings on diagnostic imaging of breast: Secondary | ICD-10-CM

## 2023-10-16 ENCOUNTER — Ambulatory Visit
Admission: RE | Admit: 2023-10-16 | Discharge: 2023-10-16 | Disposition: A | Discharge status: Home / Self Care | Source: Ambulatory Visit | Attending: Family Medicine | Admitting: Family Medicine

## 2023-10-16 VITALS — BP 127/77 | HR 73 | Temp 97.3°F | Resp 16 | Ht 63.0 in | Wt 235.0 lb

## 2023-10-16 DIAGNOSIS — D509 Iron deficiency anemia, unspecified: Secondary | ICD-10-CM | POA: Insufficient documentation

## 2023-10-16 MED ORDER — IRON SUCROSE 300 MG IVPB - SIMPLE MED
300.0000 mg | Status: DC
  Administered 2023-10-16: 300 mg via INTRAVENOUS
  Filled 2023-10-16: qty 300

## 2023-10-22 ENCOUNTER — Ambulatory Visit

## 2023-10-22 ENCOUNTER — Ambulatory Visit
Admission: RE | Admit: 2023-10-22 | Discharge: 2023-10-22 | Disposition: A | Discharge status: Home / Self Care | Source: Ambulatory Visit | Attending: Family Medicine | Admitting: Family Medicine

## 2023-10-22 DIAGNOSIS — R928 Other abnormal and inconclusive findings on diagnostic imaging of breast: Secondary | ICD-10-CM

## 2023-11-02 ENCOUNTER — Encounter: Payer: Self-pay | Admitting: Family Medicine

## 2023-11-02 MED ORDER — METHYLPHENIDATE HCL ER (OSM) 36 MG PO TBCR: 36.0000 mg | EXTENDED_RELEASE_TABLET | Freq: Every day | ORAL | 0 refills | Status: DC

## 2023-11-02 NOTE — Telephone Encounter (Signed)
 No further action needed at this time. HM modifier updated to 1 year as recommended on recent results.

## 2023-12-02 MED ORDER — METHYLPHENIDATE HCL ER (OSM) 36 MG PO TBCR: 36.0000 mg | EXTENDED_RELEASE_TABLET | Freq: Every day | ORAL | 0 refills | Status: DC

## 2023-12-02 NOTE — Telephone Encounter (Signed)
 Okay, prescription sent

## 2023-12-28 MED ORDER — RIZATRIPTAN BENZOATE 10 MG PO TBDP: 10.0000 mg | ORAL_TABLET | Freq: Every day | ORAL | 3 refills | Status: AC | PRN

## 2023-12-28 MED ORDER — METHYLPHENIDATE HCL ER (OSM) 36 MG PO TBCR: 36.0000 mg | EXTENDED_RELEASE_TABLET | Freq: Every day | ORAL | 0 refills | Status: AC

## 2023-12-28 NOTE — Telephone Encounter (Signed)
 Requesting: methylphenidate  Contract: 05/27/23 UDS: 05/27/23 Last Visit: 09/09/23 Next Visit: 01/08/24 Last Refill: 12/02/23 (30,0)  Please Advise. Rx pending

## 2023-12-28 NOTE — Telephone Encounter (Signed)
 Prescription sent

## 2024-01-08 ENCOUNTER — Ambulatory Visit: Admitting: Family Medicine

## 2024-01-20 ENCOUNTER — Encounter: Payer: Self-pay | Admitting: Family Medicine

## 2024-01-20 ENCOUNTER — Ambulatory Visit (INDEPENDENT_AMBULATORY_CARE_PROVIDER_SITE_OTHER): Admitting: Family Medicine

## 2024-01-20 VITALS — BP 134/79 | HR 76 | Temp 98.9°F | Wt 256.6 lb

## 2024-01-20 DIAGNOSIS — Z79899 Other long term (current) drug therapy: Secondary | ICD-10-CM

## 2024-01-20 DIAGNOSIS — D508 Other iron deficiency anemias: Secondary | ICD-10-CM

## 2024-01-20 DIAGNOSIS — F3341 Major depressive disorder, recurrent, in partial remission: Secondary | ICD-10-CM | POA: Diagnosis not present

## 2024-01-20 DIAGNOSIS — I10 Essential (primary) hypertension: Secondary | ICD-10-CM

## 2024-01-20 DIAGNOSIS — F99 Mental disorder, not otherwise specified: Secondary | ICD-10-CM | POA: Diagnosis not present

## 2024-01-20 DIAGNOSIS — F988 Other specified behavioral and emotional disorders with onset usually occurring in childhood and adolescence: Secondary | ICD-10-CM

## 2024-01-20 DIAGNOSIS — F4323 Adjustment disorder with mixed anxiety and depressed mood: Secondary | ICD-10-CM | POA: Diagnosis not present

## 2024-01-20 DIAGNOSIS — F5105 Insomnia due to other mental disorder: Secondary | ICD-10-CM | POA: Diagnosis not present

## 2024-01-20 MED ORDER — CLONAZEPAM 0.5 MG PO TABS: 0.5000 mg | ORAL_TABLET | Freq: Two times a day (BID) | ORAL | 1 refills | Status: AC | PRN

## 2024-01-20 NOTE — Progress Notes (Signed)
 OFFICE VISIT  01/20/2024  CC:  Chief Complaint  Patient presents with   Medical Management of Chronic Issues    Pt would like to discuss starting Klonopin  for sleep   Patient is a 49 y.o. female who presents for 56-month follow-up adult ADD, iron  deficiency anemia, and hypertension. A/P as of last visit: #1 hypertension, well-controlled on lisinopril  40 mg a day. Electrolytes and creatinine monitoring today.   2  Adult ADD, doing well on Concerta  36 mg a day. A new prescription was not needed today.   3 iron  deficiency anemia. She was unable to get the iron  infusions that we had planned back in May of this year. Will get this set up.  CBC and iron  panel today.   4  History of DVT, doing well off of Eliquis . At her follow-up visit with vascular yesterday I documented that noninvasive studies showed no evidence of DVT yesterday. She will be getting IVC filter removed soon.   5 GAD with recurrent major depressive disorder, in remission. All stable on duloxetine  60 mg a day.  INTERIM HX: Unfortunately Samantha Doyle lost her job last month.  Her company got taken over by another company and there were massive layoffs/firings. She is upset, understandably.  She is having lots of trouble sleeping.  Tearful a lot of the time.  Denies actual clinical depression, admits that she is just in a period of adjustment. Husband very supportive. She does have job prospects.  She has not been taking her methylphenidate  because she feels like she can do without it most days since she is not working out, plus it is an expensive medicine and she does not have her health insurance anymore.  PMP AWARE reviewed today: most recent rx for methylphenidate  ER 36 mg was filled 12/29/2023, # 30, rx by me.  Her pain medication is prescribed by Dr. Garvin. No red flags.  ROS --> no fevers, no CP, no SOB, no wheezing, no cough, no dizziness, no HAs, no rashes, no melena/hematochezia.  No polyuria or polydipsia.  No  myalgias or arthralgias.  No focal weakness, paresthesias, or tremors.  No acute vision or hearing abnormalities.  No dysuria or unusual/new urinary urgency or frequency.  No recent changes in lower legs. No n/v/d or abd pain.  No palpitations.    Past Medical History:  Diagnosis Date   Allergic rhinitis    Anxiety and depression    Bilateral primary osteoarthritis of knee    severe   Carpal tunnel syndrome    right wrist, numb all the time   Cervical dysplasia    Chronic back pain    Degenerative lumbar spondylosis with grade 2 spondylolisthesis L4 on L5 with bilater pars defects L4.    DDD (degenerative disc disease), lumbar    MRI 10/2015: grade I (7mm) anterolisthesis L4 on L5, w/ disc herniation with encroachment on spinal nerves at L4-S1 levels.   DVT, lower extremity, recurrent (HCC)    09/2022, bilat->eliquis  indef   Edema of both lower extremities due to peripheral venous insufficiency    +varicose veins (hx of vein stripping 2009)   GERD (gastroesophageal reflux disease)    worsened 07/2018 likely associated with lap band being too constricting->increased pantoprazole  to 40mg  bid at that time.   History of chronic bronchitis    hx of when she was a smoker: ? mild intermittent asthma?--much better since stopped smoking 2018.   History of fracture of phalanx of finger    left, 4th finger distal phalanx  History of non anemic vitamin B12 deficiency    s/p lap band surgery per pt report.   Hyperlipidemia    Hypertension    Iron  deficiency anemia 01/2019   hemoccults ordered but never turned in (01/2019-01/2020). Iron  supp started 02/2020. Hemoccults neg 07/2020, plan for iron  infusion. 08/2020 EGD with SB bx normal, no source of IDA seen on colonoscopy. Iron  infusion 09/06/2020   LAP-BAND surgery status    Pt getting lap band removed and is getting gastric sleave after.   Migraine syndrome    Ovarian cyst 10/2015   LEFT, 4 cm--noted on L spine MRI w/out contrast.  F/u u/s imaging  showed simple cyst.   Right tibial fracture    Fragility fracture 2024/2025-->calcitonin + bisphosphonate per ortho   Spondylarthrosis    Tobacco dependence in remission    quit 2018    Past Surgical History:  Procedure Laterality Date   BACK SURGERY  2019   CARPAL TUNNEL RELEASE Left    CERVICAL CONE BIOPSY     COLONOSCOPY     2015 normal.  08/2020 (for IDA)->incomplete prep, no adenomas->rpt 5 yrs d/t incomplete prep.   ESOPHAGOGASTRODUODENOSCOPY N/A 04/27/2014   Procedure: ESOPHAGOGASTRODUODENOSCOPY (EGD);  Surgeon: Alm Angle, MD;  Location: [facility];  Service: General;  Laterality: N/A;   ESOPHAGOGASTRODUODENOSCOPY  08/29/2020   6 cm hiatal hernia noted, o/w normal, SB bx neg.  ?hernia contributing to pt's IDA?   GASTRIC BANDING PORT REVISION N/A 10/02/2014   Procedure: LAPROSCOPIC PLACEMENT OF LAP BAND PORT;  Surgeon: Morene Olives, MD;  Location: [facility];  Service: General;  Laterality: N/A;   IVC FILTER INSERTION N/A 07/27/2023   Procedure: IVC FILTER INSERTION;  Surgeon: Marea Selinda RAMAN, MD;  Location: [facility];  Service: Cardiovascular;  Laterality: N/A;   IVC FILTER REMOVAL N/A 09/21/2023   Procedure: IVC FILTER REMOVAL;  Surgeon: Marea Selinda RAMAN, MD;  Location: [facility];  Service: Cardiovascular;  Laterality: N/A;   LAPAROSCOPIC GASTRIC BANDING  03/2007   [facility] - [facility]; Dr Velinda Hipp   LAPAROSCOPIC REVISION OF GASTRIC BAND N/A 05/17/2012   Procedure: LAPAROSCOPIC REVISION OF SLIPPED GASTRIC BAND;  Surgeon: Morene ONEIDA Olives, MD;  Location: [facility];  Service: General;  Laterality: N/A;   LAPAROSCOPY N/A 04/30/2014   Procedure: DIAGNOSTIC LAPAROSCOPY WITH REMOVAL OF INFECTED LAP BAND PORT WITH DEBRIDEMENT SUBCUTANEOUS ABSCESS;  Surgeon: Camellia Blush, MD;  Location: [facility];  Service: General;  Laterality: N/A;   TOTAL KNEE ARTHROPLASTY Right 07/30/2023   Procedure: ARTHROPLASTY, KNEE, TOTAL;  Surgeon: Lorelle Hussar, MD;   Location: [facility];  Service: Orthopedics;  Laterality: Right;   VARICOSE VEIN SURGERY Bilateral 2009    Outpatient Medications Prior to Visit  Medication Sig Dispense Refill   albuterol  (VENTOLIN  HFA) 108 (90 Base) MCG/ACT inhaler Inhale 2 puffs into the lungs every 4 (four) hours as needed for wheezing or shortness of breath. 18 g 1   baclofen (LIORESAL) 20 MG tablet Take 10-20 mg by mouth 4 (four) times daily as needed for muscle spasms.     cetirizine (ZYRTEC) 10 MG tablet Take 20 mg by mouth daily.     DULoxetine  (CYMBALTA ) 60 MG capsule Take 1 capsule (60 mg total) by mouth daily. 90 capsule 3   gabapentin  (NEURONTIN ) 800 MG tablet Take 800 mg by mouth in the morning, at noon, in the evening, and at bedtime.  6   lisinopril  (ZESTRIL ) 40 MG tablet Take 1 tablet (40 mg  total) by mouth daily. 90 tablet 3   Multiple Vitamin (MULITIVITAMIN WITH MINERALS) TABS Take 1 tablet by mouth daily.     Oxycodone  HCl 10 MG TABS Take 10 mg by mouth every 6 (six) hours. Do not fill until 05/26/2022 for 30 days.     pantoprazole  (PROTONIX ) 40 MG tablet Take 1 tablet (40 mg total) by mouth 2 (two) times daily. 180 tablet 3   rizatriptan  (MAXALT -MLT) 10 MG disintegrating tablet Take 1 tablet (10 mg total) by mouth daily as needed for migraine. May repeat in 2 hours if needed 10 tablet 3   methylphenidate  (CONCERTA ) 36 MG PO CR tablet Take 1 tablet (36 mg total) by mouth daily. (Patient not taking: Reported on 01/20/2024) 30 tablet 0   NARCAN 4 MG/0.1ML LIQD nasal spray kit Place 0.4 mg into the nose once. (Patient not taking: Reported on 01/20/2024)     No facility-administered medications prior to visit.    Allergies[1]  Review of Systems As per HPI  PE:    01/20/2024    2:54 PM 10/16/2023    3:43 PM 10/16/2023    1:46 PM  Vitals with BMI  Height   5' 3  Weight 256 lbs 10 oz  235 lbs  BMI   41.64  Systolic 134 127 868  Diastolic 79 77 78  Pulse 76 73 72    Physical Exam  Gen: Alert, well  appearing.  Patient is oriented to person, place, time, and situation. AFFECT: pleasant, tearful at times, lucid thought and speech. No pallor or jaundice. No pitting edema. No further exam today.   LABS:  Last CBC Lab Results  Component Value Date   WBC 5.4 09/09/2023   HGB 9.8 (L) 09/09/2023   HCT 32.5 (L) 09/09/2023   MCV 73.2 (L) 09/09/2023   MCH 22.8 (L) 07/31/2023   RDW 18.4 (H) 09/09/2023   PLT 222.0 09/09/2023   Lab Results  Component Value Date   IRON  27 (L) 09/09/2023   TIBC 381 09/09/2023   FERRITIN 10 (L) 09/09/2023   Last metabolic panel Lab Results  Component Value Date   GLUCOSE 82 09/09/2023   NA 140 09/09/2023   K 4.1 09/09/2023   CL 100 09/09/2023   CO2 32 09/09/2023   BUN 7 09/09/2023   CREATININE 0.55 09/09/2023   GFR 107.50 09/09/2023   CALCIUM 9.1 09/09/2023   PROT 6.8 09/09/2023   ALBUMIN 3.7 09/09/2023   BILITOT 0.5 09/09/2023   ALKPHOS 73 09/09/2023   AST 13 09/09/2023   ALT 8 09/09/2023   ANIONGAP 8 07/31/2023   Last lipids Lab Results  Component Value Date   CHOL 181 09/09/2023   HDL 55.80 09/09/2023   LDLCALC 103 (H) 09/09/2023   TRIG 110.0 09/09/2023   CHOLHDL 3 09/09/2023   Last thyroid  functions Lab Results  Component Value Date   TSH 3.04 09/09/2023   Last vitamin B12 and Folate Lab Results  Component Value Date   VITAMINB12 828 03/21/2016   IMPRESSION AND PLAN:  #1 hypertension, well-controlled on lisinopril  40 mg a day. Electrolytes and creatinine monitoring today.   2  Adult ADD, doing well on Concerta  36 mg a day. Not taking med lately because not employed at this time. A new prescription was not needed today.   3 iron  deficiency anemia. She got iron  infusion on 10/3 and 10/16/2023. CBC and iron  panel today.   4  History of DVT, doing well off of Eliquis . At her follow-up visit with  vascular several months ago--> noninvasive studies showed no evidence of DVT. She has an IVC filter.   5 adjustment  disorder with mixed anxious and depressed mood.  This is superimposed on GAD and recurrent major depressive disorder. Encouragement/support given today. Continue duloxetine  60 mg a day. Her biggest problem lately is periods of significant increased anxiety in the daytime as well as difficulty sleeping at night. Clonazepam  0.5 mg has worked for her well in the past. We will start this again, 0.5 mg, 1 twice daily as needed, #60, refill x 1.  An After Visit Summary was printed and given to the patient.  FOLLOW UP: Return in about 3 months (around 04/19/2024) for routine chronic illness f/u. Next CPE September 2026 Signed:  Gerlene Hockey, MD           01/20/2024       [1]  Allergies Allergen Reactions   Meperidine Hives and Nausea And Vomiting   Vancomycin  Anaphylaxis and Itching    Face/back warm and red.  Pt lips and tongue swelled, hives   Demerol Hives and Nausea And Vomiting   Microplegia Msa-Msg  [Cardioplegia [place] Nido Formula] Other (See Comments)    Other reaction(s): headache   Monosodium Glutamate Nausea And Vomiting and Other (See Comments)    migraines    Tape Rash    Plastic tape

## 2024-01-21 ENCOUNTER — Ambulatory Visit: Payer: Self-pay | Admitting: Family Medicine

## 2024-01-21 LAB — BASIC METABOLIC PANEL WITH GFR
BUN: 11 mg/dL (ref 6–23)
CO2: 34 meq/L — ABNORMAL HIGH (ref 19–32)
Calcium: 9.2 mg/dL (ref 8.4–10.5)
Chloride: 102 meq/L (ref 96–112)
Creatinine, Ser: 0.65 mg/dL (ref 0.40–1.20)
GFR: 103 mL/min
Glucose, Bld: 87 mg/dL (ref 70–99)
Potassium: 4.1 meq/L (ref 3.5–5.1)
Sodium: 140 meq/L (ref 135–145)

## 2024-01-21 LAB — CBC
HCT: 36.6 % (ref 36.0–46.0)
Hemoglobin: 11.5 g/dL — ABNORMAL LOW (ref 12.0–15.0)
MCHC: 31.5 g/dL (ref 30.0–36.0)
MCV: 81.2 fl (ref 78.0–100.0)
Platelets: 205 K/uL (ref 150.0–400.0)
RBC: 4.5 Mil/uL (ref 3.87–5.11)
RDW: 16.6 % — ABNORMAL HIGH (ref 11.5–15.5)
WBC: 8 K/uL (ref 4.0–10.5)

## 2024-01-21 LAB — IRON,TIBC AND FERRITIN PANEL
%SAT: 8 % — ABNORMAL LOW (ref 16–45)
Ferritin: 11 ng/mL — ABNORMAL LOW (ref 16–232)
Iron: 30 ug/dL — ABNORMAL LOW (ref 40–190)
TIBC: 383 ug/dL (ref 250–450)
